# Patient Record
Sex: Female | Born: 1960 | Race: White | Hispanic: No | Marital: Married | Smoking: Never smoker
Health system: Southern US, Community
[De-identification: ages and names within clinical notes are randomized; demographics above are authoritative.]

## PROBLEM LIST (undated history)

## (undated) DIAGNOSIS — C801 Malignant (primary) neoplasm, unspecified: Secondary | ICD-10-CM

## (undated) DIAGNOSIS — E785 Hyperlipidemia, unspecified: Secondary | ICD-10-CM

## (undated) DIAGNOSIS — C19 Malignant neoplasm of rectosigmoid junction: Secondary | ICD-10-CM

## (undated) DIAGNOSIS — Z433 Encounter for attention to colostomy: Secondary | ICD-10-CM

## (undated) DIAGNOSIS — T7840XA Allergy, unspecified, initial encounter: Secondary | ICD-10-CM

## (undated) DIAGNOSIS — H269 Unspecified cataract: Secondary | ICD-10-CM

## (undated) HISTORY — DX: Unspecified cataract: H26.9

## (undated) HISTORY — DX: Hyperlipidemia, unspecified: E78.5

## (undated) HISTORY — DX: Malignant (primary) neoplasm, unspecified: C80.1

## (undated) HISTORY — PX: EYE SURGERY: SHX253

## (undated) HISTORY — DX: Allergy, unspecified, initial encounter: T78.40XA

## (undated) SURGERY — SIGMOIDOSCOPY, FLEXIBLE
Anesthesia: Monitor Anesthesia Care

---

## 1993-10-19 HISTORY — PX: BRAIN SURGERY: SHX531

## 2000-10-19 HISTORY — PX: ABDOMINAL HYSTERECTOMY: SHX81

## 2001-10-19 HISTORY — PX: BREAST SURGERY: SHX581

## 2020-10-28 ENCOUNTER — Encounter (HOSPITAL_COMMUNITY): Payer: Self-pay | Admitting: Emergency Medicine

## 2020-10-28 ENCOUNTER — Emergency Department (HOSPITAL_COMMUNITY)
Admission: EM | Admit: 2020-10-28 | Discharge: 2020-10-28 | Disposition: A | Discharge status: Home / Self Care | Payer: Self-pay | Attending: Emergency Medicine | Admitting: Emergency Medicine

## 2020-10-28 DIAGNOSIS — R230 Cyanosis: Secondary | ICD-10-CM | POA: Insufficient documentation

## 2020-10-28 DIAGNOSIS — Z5321 Procedure and treatment not carried out due to patient leaving prior to being seen by health care provider: Secondary | ICD-10-CM | POA: Insufficient documentation

## 2020-10-28 NOTE — ED Notes (Signed)
No answer per registration

## 2020-10-28 NOTE — ED Notes (Signed)
No answer

## 2020-10-28 NOTE — ED Triage Notes (Signed)
Pt here with c/o here fingers turning blue , they are no longer blue , has a palpable radial pulse , this has happened before pt does not have a primary MD

## 2020-10-28 NOTE — ED Notes (Signed)
No answer in waiting area.

## 2020-11-05 DIAGNOSIS — R059 Cough, unspecified: Secondary | ICD-10-CM | POA: Diagnosis not present

## 2020-11-05 DIAGNOSIS — R0981 Nasal congestion: Secondary | ICD-10-CM | POA: Diagnosis not present

## 2020-11-05 DIAGNOSIS — J0100 Acute maxillary sinusitis, unspecified: Secondary | ICD-10-CM | POA: Diagnosis not present

## 2020-11-14 ENCOUNTER — Encounter: Payer: Self-pay | Admitting: Family Medicine

## 2020-11-14 ENCOUNTER — Other Ambulatory Visit: Payer: Self-pay

## 2020-11-14 ENCOUNTER — Ambulatory Visit: Payer: BC Managed Care – PPO | Admitting: Family Medicine

## 2020-11-14 VITALS — BP 112/66 | HR 73 | Temp 98.3°F | Ht 66.0 in | Wt 146.5 lb

## 2020-11-14 DIAGNOSIS — Z1231 Encounter for screening mammogram for malignant neoplasm of breast: Secondary | ICD-10-CM | POA: Diagnosis not present

## 2020-11-14 DIAGNOSIS — E7800 Pure hypercholesterolemia, unspecified: Secondary | ICD-10-CM | POA: Diagnosis not present

## 2020-11-14 DIAGNOSIS — Z0001 Encounter for general adult medical examination with abnormal findings: Secondary | ICD-10-CM | POA: Diagnosis not present

## 2020-11-14 DIAGNOSIS — Z7689 Persons encountering health services in other specified circumstances: Secondary | ICD-10-CM

## 2020-11-14 DIAGNOSIS — Z0000 Encounter for general adult medical examination without abnormal findings: Secondary | ICD-10-CM

## 2020-11-14 NOTE — Patient Instructions (Signed)
High Cholesterol  High cholesterol is a condition in which the blood has high levels of a white, waxy substance similar to fat (cholesterol). The liver makes all the cholesterol that the body needs. The human body needs small amounts of cholesterol to help build cells. A person gets extra or excess cholesterol from the food that he or she eats. The blood carries cholesterol from the liver to the rest of the body. If you have high cholesterol, deposits (plaques) may build up on the walls of your arteries. Arteries are the blood vessels that carry blood away from your heart. These plaques make the arteries narrow and stiff. Cholesterol plaques increase your risk for heart attack and stroke. Work with your health care provider to keep your cholesterol levels in a healthy range. What increases the risk? The following factors may make you more likely to develop this condition:  Eating foods that are high in animal fat (saturated fat) or cholesterol.  Being overweight.  Not getting enough exercise.  A family history of high cholesterol (familial hypercholesterolemia).  Use of tobacco products.  Having diabetes. What are the signs or symptoms? There are no symptoms of this condition. How is this diagnosed? This condition may be diagnosed based on the results of a blood test.  If you are older than 61 years of age, your health care provider may check your cholesterol levels every 4-6 years.  You may be checked more often if you have high cholesterol or other risk factors for heart disease. The blood test for cholesterol measures:  "Bad" cholesterol, or LDL cholesterol. This is the main type of cholesterol that causes heart disease. The desired level is less than 100 mg/dL.  "Good" cholesterol, or HDL cholesterol. HDL helps protect against heart disease by cleaning the arteries and carrying the LDL to the liver for processing. The desired level for HDL is 60 mg/dL or higher.  Triglycerides.  These are fats that your body can store or burn for energy. The desired level is less than 150 mg/dL.  Total cholesterol. This measures the total amount of cholesterol in your blood and includes LDL, HDL, and triglycerides. The desired level is less than 200 mg/dL. How is this treated? This condition may be treated with:  Diet changes. You may be asked to eat foods that have more fiber and less saturated fats or added sugar.  Lifestyle changes. These may include regular exercise, maintaining a healthy weight, and quitting use of tobacco products.  Medicines. These are given when diet and lifestyle changes have not worked. You may be prescribed a statin medicine to help lower your cholesterol levels. Follow these instructions at home: Eating and drinking  Eat a healthy, balanced diet. This diet includes: ? Daily servings of a variety of fresh, frozen, or canned fruits and vegetables. ? Daily servings of whole grain foods that are rich in fiber. ? Foods that are low in saturated fats and trans fats. These include poultry and fish without skin, lean cuts of meat, and low-fat dairy products. ? A variety of fish, especially oily fish that contain omega-3 fatty acids. Aim to eat fish at least 2 times a week.  Avoid foods and drinks that have added sugar.  Use healthy cooking methods, such as roasting, grilling, broiling, baking, poaching, steaming, and stir-frying. Do not fry your food except for stir-frying.   Lifestyle  Get regular exercise. Aim to exercise for a total of 150 minutes a week. Increase your activity level by doing activities   such as gardening, walking, and taking the stairs.  Do not use any products that contain nicotine or tobacco, such as cigarettes, e-cigarettes, and chewing tobacco. If you need help quitting, ask your health care provider.   General instructions  Take over-the-counter and prescription medicines only as told by your health care provider.  Keep all  follow-up visits as told by your health care provider. This is important. Where to find more information  American Heart Association: www.heart.org  National Heart, Lung, and Blood Institute: www.nhlbi.nih.gov Contact a health care provider if:  You have trouble achieving or maintaining a healthy diet or weight.  You are starting an exercise program.  You are unable to stop smoking. Get help right away if:  You have chest pain.  You have trouble breathing.  You have any symptoms of a stroke. "BE FAST" is an easy way to remember the main warning signs of a stroke: ? B - Balance. Signs are dizziness, sudden trouble walking, or loss of balance. ? E - Eyes. Signs are trouble seeing or a sudden change in vision. ? F - Face. Signs are sudden weakness or numbness of the face, or the face or eyelid drooping on one side. ? A - Arms. Signs are weakness or numbness in an arm. This happens suddenly and usually on one side of the body. ? S - Speech. Signs are sudden trouble speaking, slurred speech, or trouble understanding what people say. ? T - Time. Time to call emergency services. Write down what time symptoms started.  You have other signs of a stroke, such as: ? A sudden, severe headache with no known cause. ? Nausea or vomiting. ? Seizure. These symptoms may represent a serious problem that is an emergency. Do not wait to see if the symptoms will go away. Get medical help right away. Call your local emergency services (911 in the [place] Do not drive yourself to the hospital. Summary  Cholesterol plaques increase your risk for heart attack and stroke. Work with your health care provider to keep your cholesterol levels in a healthy range.  Eat a healthy, balanced diet, get regular exercise, and maintain a healthy weight.  Do not use any products that contain nicotine or tobacco, such as cigarettes, e-cigarettes, and chewing tobacco.  Get help right away if you have any symptoms of a  stroke. This information is not intended to replace advice given to you by your health care provider. Make sure you discuss any questions you have with your health care provider. Document Revised: 09/04/2019 Document Reviewed: 09/04/2019 Elsevier Patient Education  2021 Elsevier Inc.  

## 2020-11-14 NOTE — Progress Notes (Signed)
New Patient Office Visit  Subjective:  Patient ID: Karen Kaufman, female    DOB: Sep 30, 1961  Age: 60 y.o. MRN: [id number]  CC:  Chief Complaint  Patient presents with  . New Patient (Initial Visit)    HPI Karen Kaufman presents to establish care. She recently moved her from [place]. She was seeing her previous PCP every 6 months.   She has a history of high cholesterol. She takes atorvastatin daily and deneis side effects.. She just completed steroids for sinusitis. She has a few more days on the antibiotics for sinusitis. She has no concerns today.   Past Medical History:  Diagnosis Date  . Allergy   . Cancer [facility]    skin cancer removed by dermatology  . Cataract   . Hyperlipidemia     Past Surgical History:  Procedure Laterality Date  . ABDOMINAL HYSTERECTOMY  2002  . 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 BREAST SURGERY  2003   breast implants  . EYE SURGERY     bilateral cataract removal    Family History  Problem Relation Age of Onset  . Cancer Mother        breast  . Diabetes Mother   . Hyperlipidemia Mother   . Hypertension Mother   . Cancer Father        prostate  . Arthritis Maternal Grandmother   . Arthritis Maternal Grandfather   . Cancer Maternal Grandfather        hodgkins lymphoma  . Arthritis Paternal Grandmother   . Arthritis Paternal Grandfather   . Diabetes Paternal Grandfather     Social History   Socioeconomic History  . Marital status: Married    Spouse name: Not on file  . Number of children: 2  . Years of education: 61  . Highest education level: Some college, no degree  Occupational History  . Occupation: retired  Tobacco Use  . Smoking status: Never Smoker  . Smokeless tobacco: Never Used  Vaping Use  . Vaping Use: Never used  Substance and Sexual Activity  . Alcohol use: Not Currently  . Drug use: Not Currently  . Sexual activity: Yes    Birth control/protection: Surgical  Other  Topics Concern  . Not on file  Social History Narrative  . Not on file   Social Determinants of Health   Financial Resource Strain: Not on file  Food Insecurity: Not on file  Transportation Needs: Not on file  Physical Activity: Not on file  Stress: Not on file  Social Connections: Not on file  Intimate Partner Violence: Not on file    ROS Review of Systems Negative unless specially indicated above in HPI.  Objective:   Today's Vitals: BP 112/66   Pulse 73   Temp 98.3 F (36.8 C) (Temporal)   Ht _0  (1.676 m)   Wt 146 lb 8 oz (66.5 kg)   BMI 23.65 kg/m   Physical Exam Vitals and nursing note reviewed.  Constitutional:      Appearance: Normal appearance.  HENT:     Head: Normocephalic and atraumatic.     Right Ear: Tympanic membrane, ear canal and external ear normal.     Left Ear: Tympanic membrane, ear canal and external ear normal.     Nose: Nose normal.     Mouth/Throat:     Mouth: Mucous membranes are moist.     Pharynx: Oropharynx is clear.  Eyes:     Extraocular  Movements: Extraocular movements intact.     Conjunctiva/sclera: Conjunctivae normal.  Neck:     Vascular: No carotid bruit.  Cardiovascular:     Rate and Rhythm: Normal rate and regular rhythm.     Heart sounds: Normal heart sounds. No murmur heard.   Pulmonary:     Effort: Pulmonary effort is normal. No respiratory distress.     Breath sounds: Normal breath sounds.  Musculoskeletal:     Cervical back: Neck supple. No tenderness.     Right lower leg: No edema.     Left lower leg: No edema.  Skin:    General: Skin is warm and dry.  Neurological:     General: No focal deficit present.     Mental Status: She is alert and oriented to person, place, and time.     Motor: No weakness.     Gait: Gait normal.  Psychiatric:        Mood and Affect: Mood normal.        Behavior: Behavior normal.     Assessment & Plan:   Walsie was seen today for new patient (initial visit).  Diagnoses  and all orders for this visit:  Hypercholesterolemia On statin therapy. Patient is not fasting, she will return for fasting blood work.  -     CMP14+EGFR; Future -     Lipid panel; Future  Encounter for screening mammogram for malignant neoplasm of breast Mammogram ordered. -     MM Digital Screening; Future  Healthcare maintenance Reports having cologuard done last year. She will return in a few weeks for a flu vaccine and TDAP.   Encounter to establish care Patient will return for labs. Records requested from previous PCP.  -     CBC with Differential/Platelet; Future -     CMP14+EGFR; Future -     Lipid panel; Future -     TSH; Future  Follow-up: Return in about 6 months (around 05/14/2021) for chronic follow up.   Gwenlyn Perking, FNP

## 2020-11-28 ENCOUNTER — Other Ambulatory Visit: Payer: BC Managed Care – PPO

## 2020-11-28 ENCOUNTER — Other Ambulatory Visit: Payer: Self-pay

## 2020-11-28 DIAGNOSIS — Z7689 Persons encountering health services in other specified circumstances: Secondary | ICD-10-CM

## 2020-11-28 DIAGNOSIS — E7800 Pure hypercholesterolemia, unspecified: Secondary | ICD-10-CM | POA: Diagnosis not present

## 2020-11-29 LAB — CBC WITH DIFFERENTIAL/PLATELET
Basophils Absolute: 0 10*3/uL (ref 0.0–0.2)
Basos: 0 %
EOS (ABSOLUTE): 0.1 10*3/uL (ref 0.0–0.4)
Eos: 2 %
Hematocrit: 41.5 % (ref 34.0–46.6)
Hemoglobin: 14 g/dL (ref 11.1–15.9)
Immature Grans (Abs): 0 10*3/uL (ref 0.0–0.1)
Immature Granulocytes: 0 %
Lymphocytes Absolute: 2 10*3/uL (ref 0.7–3.1)
Lymphs: 37 %
MCH: 30.4 pg (ref 26.6–33.0)
MCHC: 33.7 g/dL (ref 31.5–35.7)
MCV: 90 fL (ref 79–97)
Monocytes Absolute: 0.4 10*3/uL (ref 0.1–0.9)
Monocytes: 8 %
Neutrophils Absolute: 2.9 10*3/uL (ref 1.4–7.0)
Neutrophils: 53 %
Platelets: 238 10*3/uL (ref 150–450)
RBC: 4.6 x10E6/uL (ref 3.77–5.28)
RDW: 12.5 % (ref 11.7–15.4)
WBC: 5.5 10*3/uL (ref 3.4–10.8)

## 2020-11-29 LAB — LIPID PANEL
Chol/HDL Ratio: 2.9 ratio (ref 0.0–4.4)
Cholesterol, Total: 148 mg/dL (ref 100–199)
HDL: 51 mg/dL (ref >39)
LDL Chol Calc (NIH): 76 mg/dL (ref 0–99)
Triglycerides: 120 mg/dL (ref 0–149)
VLDL Cholesterol Cal: 21 mg/dL (ref 5–40)

## 2020-11-29 LAB — CMP14+EGFR
ALT: 12 IU/L (ref 0–32)
AST: 20 IU/L (ref 0–40)
Albumin/Globulin Ratio: 2.3 — ABNORMAL HIGH (ref 1.2–2.2)
Albumin: 4.6 g/dL (ref 3.8–4.8)
Alkaline Phosphatase: 118 IU/L (ref 44–121)
BUN/Creatinine Ratio: 9 — ABNORMAL LOW (ref 12–28)
BUN: 7 mg/dL — ABNORMAL LOW (ref 8–27)
Bilirubin Total: 0.5 mg/dL (ref 0.0–1.2)
CO2: 25 mmol/L (ref 20–29)
Calcium: 9.6 mg/dL (ref 8.7–10.3)
Chloride: 101 mmol/L (ref 96–106)
Creatinine, Ser: 0.8 mg/dL (ref 0.57–1.00)
GFR calc Af Amer: 92 mL/min/{1.73_m2} (ref >59)
GFR calc non Af Amer: 80 mL/min/{1.73_m2} (ref >59)
Globulin, Total: 2 g/dL (ref 1.5–4.5)
Glucose: 94 mg/dL (ref 65–99)
Potassium: 4.9 mmol/L (ref 3.5–5.2)
Sodium: 141 mmol/L (ref 134–144)
Total Protein: 6.6 g/dL (ref 6.0–8.5)

## 2020-11-29 LAB — TSH: TSH: 2.69 u[IU]/mL (ref 0.450–4.500)

## 2020-12-11 ENCOUNTER — Encounter: Payer: Self-pay | Admitting: Family Medicine

## 2020-12-11 ENCOUNTER — Ambulatory Visit: Payer: BC Managed Care – PPO | Admitting: Family Medicine

## 2020-12-11 ENCOUNTER — Other Ambulatory Visit: Payer: Self-pay

## 2020-12-11 VITALS — BP 110/70 | HR 68 | Temp 97.9°F | Ht 66.0 in | Wt 148.2 lb

## 2020-12-11 DIAGNOSIS — Z23 Encounter for immunization: Secondary | ICD-10-CM

## 2020-12-11 DIAGNOSIS — Z0001 Encounter for general adult medical examination with abnormal findings: Secondary | ICD-10-CM | POA: Diagnosis not present

## 2020-12-11 DIAGNOSIS — Z0184 Encounter for antibody response examination: Secondary | ICD-10-CM | POA: Diagnosis not present

## 2020-12-11 DIAGNOSIS — Z0289 Encounter for other administrative examinations: Secondary | ICD-10-CM

## 2020-12-11 NOTE — Progress Notes (Signed)
Established Patient Office Visit  Subjective:  Patient ID: Karen Kaufman, female    DOB: [birth year]  Age: 60 y.o. MRN: [id number]  CC:  Chief Complaint  Patient presents with  . Employment Physical    HPI Karen Kaufman presents for an employment physical. 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 She has recently moved from [facility] and is unsure of her immunization status regarding Hep B. She knows it has been at least 5 years since her last tetanus and probably longer. She reports that she is doing well and denies any concerns today.   Past Medical History:  Diagnosis Date  . Allergy   . Cancer [facility]    skin cancer removed by dermatology  . Cataract   . Hyperlipidemia     Past Surgical History:  Procedure Laterality Date  . ABDOMINAL HYSTERECTOMY  2002  . 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 BREAST SURGERY  2003   breast implants  . EYE SURGERY     bilateral cataract removal    Family History  Problem Relation Age of Onset  . Cancer Mother        breast  . Diabetes Mother   . Hyperlipidemia Mother   . Hypertension Mother   . Cancer Father        prostate  . Arthritis Maternal Grandmother   . Arthritis Maternal Grandfather   . Cancer Maternal Grandfather        hodgkins lymphoma  . Arthritis Paternal Grandmother   . Arthritis Paternal Grandfather   . Diabetes Paternal Grandfather     Social History   Socioeconomic History  . Marital status: Married    Spouse name: Not on file  . Number of children: 2  . Years of education: 42  . Highest education level: Some college, no degree  Occupational History  . Occupation: retired  Tobacco Use  . Smoking status: Never Smoker  . Smokeless tobacco: Never Used  Vaping Use  . Vaping Use: Never used  Substance and Sexual Activity  . Alcohol use: Not Currently  . Drug use: Not Currently  . Sexual activity: Yes    Birth  control/protection: Surgical  Other Topics Concern  . Not on file  Social History Narrative  . Not on file   Social Determinants of Health   Financial Resource Strain: Not on file  Food Insecurity: Not on file  Transportation Needs: Not on file  Physical Activity: Not on file  Stress: Not on file  Social Connections: Not on file  Intimate Partner Violence: Not on file    Outpatient Medications Prior to Visit  Medication Sig Dispense Refill  . atorvastatin (LIPITOR) 20 MG tablet Take 20 mg by mouth daily.    Marland Kitchen MAGNESIUM PO Take 400 mg by mouth daily.    Marland Kitchen ipratropium (ATROVENT) 0.06 % nasal spray 2 sprays into each nostril Three (3) times a day.     No facility-administered medications prior to visit.    Allergies  Allergen Reactions  . Iodine Anaphylaxis  . Shellfish Allergy Anaphylaxis    ROS Review of Systems Negative unless specially indicated above in HPI.   Objective:    Physical Exam Vitals and nursing note reviewed.  Constitutional:      General: She is not in acute distress.    Appearance: Normal appearance. She is well-developed. She is not ill-appearing, toxic-appearing or diaphoretic.  HENT:     Head: Normocephalic and atraumatic.     Nose: Nose normal.     Mouth/Throat:     Mouth: Mucous membranes are moist.     Pharynx: Oropharynx is clear.  Eyes:     Extraocular Movements: Extraocular movements intact.     Conjunctiva/sclera: Conjunctivae normal.     Pupils: Pupils are equal, round, and reactive to light.  Neck:     Thyroid: No thyromegaly.     Vascular: No carotid bruit or JVD.     Trachea: Trachea normal.  Cardiovascular:     Rate and Rhythm: Normal rate and regular rhythm.     Heart sounds: Normal heart sounds. No murmur heard. No friction rub. No gallop.   Pulmonary:     Effort: Pulmonary effort is normal.     Breath sounds: Normal breath sounds.  Abdominal:     General: Bowel sounds are normal. There is no distension.      Palpations: Abdomen is soft. There is no mass.     Tenderness: There is no abdominal tenderness.  Musculoskeletal:        General: Normal range of motion.     Cervical back: Full passive range of motion without pain, normal range of motion and neck supple.     Right lower leg: No edema.     Left lower leg: No edema.  Lymphadenopathy:     Cervical: No cervical adenopathy.  Skin:    General: Skin is warm and dry.  Neurological:     General: No focal deficit present.     Mental Status: She is alert and oriented to person, place, and time.     Motor: No weakness.     Gait: Gait normal.     Deep Tendon Reflexes: Reflexes are normal and symmetric.  Psychiatric:        Mood and Affect: Mood normal.        Behavior: Behavior normal.        Thought Content: Thought content normal.        Judgment: Judgment normal.     Temp 97.9 F (36.6 C) (Temporal)   Ht 5\' 6"  (1.676 m)   Wt 148 lb 4 oz (67.2 kg)   BMI 23.93 kg/m  Wt Readings from Last 3 Encounters:  12/11/20 148 lb 4 oz (67.2 kg)  11/14/20 146 lb 8 oz (66.5 kg)  10/28/20 142 lb (64.4 kg)     Health Maintenance Due  Topic Date Due  . TETANUS/TDAP  Never done  . COLONOSCOPY (Pts 45-58yrs Insurance coverage will need to be confirmed)  Never done  . MAMMOGRAM  Never done  . INFLUENZA VACCINE  Never done    There are no preventive care reminders to display for this patient.  Lab Results  Component Value Date   TSH 2.690 11/28/2020   Lab Results  Component Value Date   WBC 5.5 11/28/2020   HGB 14.0 11/28/2020   HCT 41.5 11/28/2020   MCV 90 11/28/2020   PLT 238 11/28/2020   Lab Results  Component Value Date   NA 141 11/28/2020   K 4.9 11/28/2020   CO2 25 11/28/2020   GLUCOSE 94 11/28/2020   BUN 7 (L) 11/28/2020   CREATININE 0.80 11/28/2020   BILITOT 0.5 11/28/2020   ALKPHOS 118 11/28/2020   AST 20 11/28/2020   ALT 12 11/28/2020   PROT 6.6 11/28/2020   ALBUMIN 4.6 11/28/2020   CALCIUM 9.6 11/28/2020   Lab  Results  Component Value Date   CHOL 148 11/28/2020   Lab Results  Component Value Date   HDL 51 11/28/2020   Lab Results  Component Value Date   LDLCALC 76 11/28/2020   Lab Results  Component Value Date   TRIG 120 11/28/2020   Lab Results  Component Value Date   CHOLHDL 2.9 11/28/2020   No results found for: HGBA1C    Assessment & Plan:   Karen Kaufman was seen today for employment physical.  Diagnoses and all orders for this visit:  Encounter for physical examination related to employment Exam unremarkable today, no physical or mental disabilities that would impair job performance. Titers pending as below. Quantiferon gold ordered for TB testing. Will complete paperwork for patient when labs result.  -     Measles/Mumps/Rubella Immunity -     Varicella zoster antibody, IgG -     QuantiFERON-TB Gold Plus -     Hepatitis B surface antibody,quantitative  Immunity status testing Labs pending as below -     Measles/Mumps/Rubella Immunity -     Varicella zoster antibody, IgG -     QuantiFERON-TB Gold Plus -     Hepatitis B surface antibody,quantitative  Need for Tdap vaccination Vaccine today in office.    Follow-up: Return if symptoms worsen or fail to improve.   The patient indicates understanding of these issues and agrees with the plan.    Gwenlyn Perking, FNP

## 2020-12-11 NOTE — Patient Instructions (Signed)
Tdap (Tetanus, Diphtheria, Pertussis) Vaccine: What You Need to Know 1. Why get vaccinated? Tdap vaccine can prevent tetanus, diphtheria, and pertussis. Diphtheria and pertussis spread from person to person. Tetanus enters the body through cuts or wounds.  TETANUS (T) causes painful stiffening of the muscles. Tetanus can lead to serious health problems, including being unable to open the mouth, having trouble swallowing and breathing, or death.  DIPHTHERIA (D) can lead to difficulty breathing, heart failure, paralysis, or death.  PERTUSSIS (aP), also known as "whooping cough," can cause uncontrollable, violent coughing that makes it hard to breathe, eat, or drink. Pertussis can be extremely serious especially in babies and young children, causing pneumonia, convulsions, brain damage, or death. In teens and adults, it can cause weight loss, loss of bladder control, passing out, and rib fractures from severe coughing. 2. Tdap vaccine Tdap is only for children 7 years and older, adolescents, and adults.  Adolescents should receive a single dose of Tdap, preferably at age 11 or 12 years. Pregnant people should get a dose of Tdap during every pregnancy, preferably during the early part of the third trimester, to help protect the newborn from pertussis. Infants are most at risk for severe, life-threatening complications from pertussis. Adults who have never received Tdap should get a dose of Tdap. Also, adults should receive a booster dose of either Tdap or Td (a different vaccine that protects against tetanus and diphtheria but not pertussis) every 10 years, or after 5 years in the case of a severe or dirty wound or burn. Tdap may be given at the same time as other vaccines. 3. Talk with your health care provider Tell your vaccine provider if the person getting the vaccine:  Has had an allergic reaction after a previous dose of any vaccine that protects against tetanus, diphtheria, or pertussis, or  has any severe, life-threatening allergies  Has had a coma, decreased level of consciousness, or prolonged seizures within 7 days after a previous dose of any pertussis vaccine (DTP, DTaP, or Tdap)  Has seizures or another nervous system problem  Has ever had Guillain-Barr Syndrome (also called "GBS")  Has had severe pain or swelling after a previous dose of any vaccine that protects against tetanus or diphtheria In some cases, your health care provider may decide to postpone Tdap vaccination until a future visit. People with minor illnesses, such as a cold, may be vaccinated. People who are moderately or severely ill should usually wait until they recover before getting Tdap vaccine.  Your health care provider can give you more information. 4. Risks of a vaccine reaction  Pain, redness, or swelling where the shot was given, mild fever, headache, feeling tired, and nausea, vomiting, diarrhea, or stomachache sometimes happen after Tdap vaccination. People sometimes faint after medical procedures, including vaccination. Tell your provider if you feel dizzy or have vision changes or ringing in the ears.  As with any medicine, there is a very remote chance of a vaccine causing a severe allergic reaction, other serious injury, or death. 5. What if there is a serious problem? An allergic reaction could occur after the vaccinated person leaves the clinic. If you see signs of a severe allergic reaction (hives, swelling of the face and throat, difficulty breathing, a fast heartbeat, dizziness, or weakness), call 9-1-1 and get the person to the nearest hospital. For other signs that concern you, call your health care provider.  Adverse reactions should be reported to the Vaccine Adverse Event Reporting System (VAERS). Your health   care provider will usually file this report, or you can do it yourself. Visit the VAERS website at www.vaers.hhs.gov or call 1-800-822-7967. VAERS is only for reporting  reactions, and VAERS staff members do not give medical advice. 6. The National Vaccine Injury Compensation Program The National Vaccine Injury Compensation Program (VICP) is a federal program that was created to compensate people who may have been injured by certain vaccines. Claims regarding alleged injury or death due to vaccination have a time limit for filing, which may be as short as two years. Visit the VICP website at www.hrsa.gov/vaccinecompensation or call 1-800-338-2382 to learn about the program and about filing a claim. 7. How can I learn more?  Ask your health care provider.  Call your local or state health department.  Visit the website of the Food and Drug Administration (FDA) for vaccine package inserts and additional information at www.fda.gov/vaccines-blood-biologics/vaccines.  Contact the Centers for Disease Control and Prevention (CDC): ? Call 1-800-232-4636 (1-800-CDC-INFO) or ? Visit CDC's website at www.cdc.gov/vaccines. Vaccine Information Statement Tdap (Tetanus, Diphtheria, Pertussis) Vaccine (05/24/2020) This information is not intended to replace advice given to you by your health care provider. Make sure you discuss any questions you have with your health care provider. Document Revised: 06/19/2020 Document Reviewed: 06/19/2020 Elsevier Patient Education  2021 Elsevier Inc.  

## 2020-12-14 LAB — QUANTIFERON-TB GOLD PLUS
QuantiFERON Mitogen Value: >10 IU/mL
QuantiFERON Nil Value: 0.05 IU/mL
QuantiFERON TB1 Ag Value: 0.04 IU/mL
QuantiFERON TB2 Ag Value: 0.05 IU/mL
QuantiFERON-TB Gold Plus: NEGATIVE

## 2020-12-14 LAB — VARICELLA ZOSTER ANTIBODY, IGG: Varicella zoster IgG: 1363 index (ref >165)

## 2020-12-14 LAB — MEASLES/MUMPS/RUBELLA IMMUNITY
MUMPS ABS, IGG: 216 AU/mL (ref >10.9)
RUBEOLA AB, IGG: >300 AU/mL (ref >16.4)
Rubella Antibodies, IGG: 7.55 index (ref >0.99)

## 2020-12-14 LAB — HEPATITIS B SURFACE ANTIBODY, QUANTITATIVE: Hepatitis B Surf Ab Quant: <3.1 m[IU]/mL — ABNORMAL LOW (ref >9.9)

## 2020-12-17 ENCOUNTER — Other Ambulatory Visit: Payer: Self-pay

## 2020-12-17 ENCOUNTER — Ambulatory Visit (INDEPENDENT_AMBULATORY_CARE_PROVIDER_SITE_OTHER): Payer: BC Managed Care – PPO | Admitting: *Deleted

## 2020-12-17 DIAGNOSIS — Z23 Encounter for immunization: Secondary | ICD-10-CM

## 2020-12-17 NOTE — Progress Notes (Signed)
Tolerated well

## 2021-01-30 ENCOUNTER — Other Ambulatory Visit: Payer: Self-pay

## 2021-01-30 ENCOUNTER — Ambulatory Visit (INDEPENDENT_AMBULATORY_CARE_PROVIDER_SITE_OTHER): Payer: BC Managed Care – PPO | Admitting: *Deleted

## 2021-01-30 DIAGNOSIS — Z23 Encounter for immunization: Secondary | ICD-10-CM | POA: Diagnosis not present

## 2021-01-30 NOTE — Progress Notes (Signed)
Pt tolerated Hep B #2 well

## 2021-01-31 ENCOUNTER — Ambulatory Visit: Payer: BC Managed Care – PPO

## 2021-02-12 ENCOUNTER — Other Ambulatory Visit: Payer: Self-pay | Admitting: Family Medicine

## 2021-02-12 ENCOUNTER — Other Ambulatory Visit: Payer: Self-pay

## 2021-02-12 ENCOUNTER — Ambulatory Visit
Admission: RE | Admit: 2021-02-12 | Discharge: 2021-02-12 | Disposition: A | Discharge status: Home / Self Care | Payer: BC Managed Care – PPO | Source: Ambulatory Visit | Attending: Family Medicine | Admitting: Family Medicine

## 2021-02-12 DIAGNOSIS — Z1231 Encounter for screening mammogram for malignant neoplasm of breast: Secondary | ICD-10-CM

## 2021-03-03 ENCOUNTER — Telehealth: Payer: BC Managed Care – PPO | Admitting: Family Medicine

## 2021-03-03 ENCOUNTER — Encounter: Payer: Self-pay | Admitting: Family Medicine

## 2021-03-03 DIAGNOSIS — U071 COVID-19: Secondary | ICD-10-CM

## 2021-03-03 NOTE — Progress Notes (Signed)
   Virtual Visit  Note Due to COVID-19 pandemic this visit was conducted virtually. This visit type was conducted due to national recommendations for restrictions regarding the COVID-19 Pandemic (e.g. social distancing, sheltering in place) in an effort to limit this patient's exposure and mitigate transmission in our community. All issues noted in this document were discussed and addressed.  A physical exam was not performed with this format.  I connected with Karen Kaufman on 03/03/21 at 0820 by telephone and verified that I am speaking with the correct person using two identifiers. Karen Kaufman is currently located at home and her husband is currently with her during the visit. The provider, Gwenlyn Perking, FNP is located in their office at time of visit.  I discussed the limitations, risks, security and privacy concerns of performing an evaluation and management service by telephone and the availability of in person appointments. I also discussed with the patient that there may be a patient responsible charge related to this service. The patient expressed understanding and agreed to proceed.  CC: Covid positive  History and Present Illness:  HPI  Karen Kaufman tested positive for Covid last night with a home Covid test. She reports that her symptoms started 5 days ago with a headache, nausea, chills, congestion, and sinus pressure. She also reports fatigue and cough. Reports highest temperature of 99.6. She has been taking sudafed with some improvement. She denies shortness of breath or chest pain. She has been vaccinated.    ROS As per HPI.  Observations/Objective: Alert and oriented x 3. Able to speak in full sentences without difficulty.    Assessment and Plan: Diagnoses and all orders for this visit:  COVID Symptomatic care discussed: tylenol for pain, fever. Mucinex for congestion, cough. Rest, stay well hydrated. Quarantine until respiratory symptoms improve. Work note provided.  Patient is aware of when to seek emergency care. Return to office for new or worsening symptoms, or if symptoms persist.     Follow Up Instructions: Return to office for new or worsening symptoms, or if symptoms persist.     I discussed the assessment and treatment plan with the patient. The patient was provided an opportunity to ask questions and all were answered. The patient agreed with the plan and demonstrated an understanding of the instructions.   The patient was advised to call back or seek an in-person evaluation if the symptoms worsen or if the condition fails to improve as anticipated.  The above assessment and management plan was discussed with the patient. The patient verbalized understanding of and has agreed to the management plan. Patient is aware to call the clinic if symptoms persist or worsen. Patient is aware when to return to the clinic for a follow-up visit. Patient educated on when it is appropriate to go to the emergency department.   Time call ended: 0832   I provided 12 minutes of  non face-to-face time during this encounter.    Gwenlyn Perking, FNP

## 2021-04-04 ENCOUNTER — Ambulatory Visit (INDEPENDENT_AMBULATORY_CARE_PROVIDER_SITE_OTHER): Payer: BC Managed Care – PPO

## 2021-04-04 ENCOUNTER — Other Ambulatory Visit: Payer: Self-pay

## 2021-04-04 ENCOUNTER — Encounter: Payer: Self-pay | Admitting: Family Medicine

## 2021-04-04 ENCOUNTER — Ambulatory Visit: Payer: BC Managed Care – PPO | Admitting: Family Medicine

## 2021-04-04 VITALS — BP 111/73 | HR 70 | Temp 96.9°F | Ht 66.0 in | Wt 153.4 lb

## 2021-04-04 DIAGNOSIS — R194 Change in bowel habit: Secondary | ICD-10-CM | POA: Diagnosis not present

## 2021-04-04 DIAGNOSIS — R159 Full incontinence of feces: Secondary | ICD-10-CM

## 2021-04-04 DIAGNOSIS — R140 Abdominal distension (gaseous): Secondary | ICD-10-CM

## 2021-04-04 NOTE — Patient Instructions (Signed)
Abdominal Bloating When you have abdominal bloating, your abdomen may feel full, tight, or painful. It may also look bigger than normal or swollen (distended). Common causes of abdominal bloating include: Swallowing air. Constipation. Problems digesting food. Eating too much. Irritable bowel syndrome. This is a condition that affects the large intestine. Lactose intolerance. This is an inability to digest lactose, a natural sugar in dairy products. Celiac disease. This is a condition that affects the ability to digest gluten, a protein found in some grains. Gastroparesis. This is a condition that slows down the movement of food in the stomach and small intestine. It is more common in people with diabetes mellitus. Gastroesophageal reflux disease (GERD). This is a digestive condition that makes stomach acid flow back into the esophagus. Urinary retention. This means that the body is holding onto urine, and the bladder cannot be emptied all the way. Follow these instructions at home: Eating and drinking Avoid eating too much. Try not to swallow air while talking or eating. Avoid eating while lying down. Avoid these foods and drinks: Foods that cause gas, such as broccoli, cabbage, cauliflower, and baked beans. Carbonated drinks. Hard candy. Chewing gum. Medicines Take over-the-counter and prescription medicines only as told by your health care provider. Take probiotic medicines. These medicines contain live bacteria or yeasts that can help digestion. Take coated peppermint oil capsules. Activity Try to exercise regularly. Exercise may help to relieve bloating that is caused by gas and relieve constipation. General instructions Keep all follow-up visits as told by your health care provider. This is important. Contact a health care provider if: You have nausea and vomiting. You have diarrhea. You have abdominal pain. You have unusual weight loss or weight gain. You have severe pain,  and medicines do not help. Get help right away if: You have severe chest pain. You have trouble breathing. You have shortness of breath. You have trouble urinating. You have darker urine than normal. You have blood in your stools or have dark, tarry stools. Summary Abdominal bloating means that the abdomen is swollen. Common causes of abdominal bloating are swallowing air, constipation, and problems digesting food. Avoid eating too much and avoid swallowing air. Avoid foods that cause gas, carbonated drinks, hard candy, and chewing gum. This information is not intended to replace advice given to you by your health care provider. Make sure you discuss any questions you have with your health care provider. Document Revised: 01/23/2019 Document Reviewed: 11/06/2016 Elsevier Patient Education  2021 Elsevier Inc.  

## 2021-04-04 NOTE — Progress Notes (Signed)
Acute Office Visit  Subjective:    Patient ID: Karen Kaufman, female    DOB: 16-Jan-1961, 60 y.o.   MRN: [id number]  Chief Complaint  Patient presents with   Bloated    HPI Patient is in today for bloating and fecal incontinence for the last 4 months. The bloating is intermittent. The incontinence is almost daily, mostly occurs with passing flatus but sometimes occurs unprovoked. She has had to wear a panty liner because of this. Also reports increase flatus. Denies fever, change in diet, change in appetite, blood in stool, nausea, vomiting, weight loss, or abdominal pain. Denies constipation or diarrhea. She has a BM daily that is easy to pass. She sometimes sees mucus in her stool. Stool is brown in color. She has never had had a colonoscopy. Denies family history of colon cancer.  Past Medical History:  Diagnosis Date   Allergy    Cancer ([place])    skin cancer removed by dermatology   Cataract    Hyperlipidemia     Past Surgical History:  Procedure Laterality Date   ABDOMINAL HYSTERECTOMY  2002   [place]   craniotomy-removal of tumor from behind right eye   BREAST SURGERY  2003   breast implants   EYE SURGERY     bilateral cataract removal    Family History  Problem Relation Age of Onset   Cancer Mother        breast   Diabetes Mother    Hyperlipidemia Mother    Hypertension Mother    Cancer Father        prostate   Arthritis Maternal Grandmother    Arthritis Maternal Grandfather    Cancer Maternal Grandfather        hodgkins lymphoma   Arthritis Paternal Grandmother    Arthritis Paternal Grandfather    Diabetes Paternal Grandfather     Social History   Socioeconomic History   Marital status: Married    Spouse name: Not on file   Number of children: 2   Years of education: 13   Highest education level: Some college, no degree  Occupational History   Occupation: retired  Tobacco Use   Smoking status: Never   Smokeless tobacco: Never  IT trainer Use: Never used  Substance and Sexual Activity   Alcohol use: Not Currently   Drug use: Not Currently   Sexual activity: Yes    Birth control/protection: Surgical  Other Topics Concern   Not on file  Social History Narrative   Not on file   Social Determinants of Health   Financial Resource Strain: Not on file  Food Insecurity: Not on file  Transportation Needs: Not on file  Physical Activity: Not on file  Stress: Not on file  Social Connections: Not on file  Intimate Partner Violence: Not on file    Outpatient Medications Prior to Visit  Medication Sig Dispense Refill   atorvastatin (LIPITOR) 20 MG tablet Take 20 mg by mouth daily.     MAGNESIUM PO Take 400 mg by mouth daily.     No facility-administered medications prior to visit.    Allergies  Allergen Reactions   Iodine Anaphylaxis   Shellfish Allergy Anaphylaxis    Review of Systems As per HPI.     Objective:    Physical Exam Vitals and nursing note reviewed.  Constitutional:      General: She is not in acute distress.    Appearance: Normal appearance. She is not  ill-appearing, toxic-appearing or diaphoretic.  Cardiovascular:     Rate and Rhythm: Normal rate and regular rhythm.     Heart sounds: Normal heart sounds. No murmur heard. Pulmonary:     Effort: Pulmonary effort is normal. No respiratory distress.     Breath sounds: Normal breath sounds.  Abdominal:     General: Bowel sounds are normal. There is no distension.     Palpations: Abdomen is soft. There is no mass.     Tenderness: There is no abdominal tenderness. There is no guarding or rebound.     Hernia: No hernia is present.  Musculoskeletal:     Right lower leg: No edema.     Left lower leg: No edema.  Skin:    General: Skin is warm and dry.  Neurological:     General: No focal deficit present.     Mental Status: She is alert and oriented to person, place, and time.  Psychiatric:        Mood and Affect: Mood normal.         Behavior: Behavior normal.    BP 111/73   Pulse 70   Temp (!) 96.9 F (36.1 C) (Oral)   Ht 5\' 6"  (1.676 m)   Wt 153 lb 6 oz (69.6 kg)   BMI 24.76 kg/m  Wt Readings from Last 3 Encounters:  04/04/21 153 lb 6 oz (69.6 kg)  12/11/20 148 lb 4 oz (67.2 kg)  11/14/20 146 lb 8 oz (66.5 kg)    Health Maintenance Due  Topic Date Due   COLONOSCOPY (Pts 45-53yrs Insurance coverage will need to be confirmed)  Never done   Zoster Vaccines- Shingrix (1 of 2) Never done   COVID-19 Vaccine (3 - Booster for [place] series) 02/01/2021    There are no preventive care reminders to display for this patient.   Lab Results  Component Value Date   TSH 2.690 11/28/2020   Lab Results  Component Value Date   WBC 5.5 11/28/2020   HGB 14.0 11/28/2020   HCT 41.5 11/28/2020   MCV 90 11/28/2020   PLT 238 11/28/2020   Lab Results  Component Value Date   NA 141 11/28/2020   K 4.9 11/28/2020   CO2 25 11/28/2020   GLUCOSE 94 11/28/2020   BUN 7 (L) 11/28/2020   CREATININE 0.80 11/28/2020   BILITOT 0.5 11/28/2020   ALKPHOS 118 11/28/2020   AST 20 11/28/2020   ALT 12 11/28/2020   PROT 6.6 11/28/2020   ALBUMIN 4.6 11/28/2020   CALCIUM 9.6 11/28/2020   Lab Results  Component Value Date   CHOL 148 11/28/2020   Lab Results  Component Value Date   HDL 51 11/28/2020   Lab Results  Component Value Date   LDLCALC 76 11/28/2020   Lab Results  Component Value Date   TRIG 120 11/28/2020   Lab Results  Component Value Date   CHOLHDL 2.9 11/28/2020   No results found for: HGBA1C     Assessment & Plan:   Allien was seen today for bloated.  Diagnoses and all orders for this visit:  Incontinence of feces, unspecified fecal incontinence type Bloating Bloating, fecal incontinence usually with passing gas, increased flatus. No other symptoms. Xray appear normal today, radiology report is pending. Benign exam today. Referral placed to GI for further assessment and colonoscopy for colon  cancer screening.  -     DG Abd 1 View; Future -     Ambulatory referral to Gastroenterology  Return  in about 8 months (around 12/11/2021) for CPE. Sooner for new or worsening symptoms.   The patient indicates understanding of these issues and agrees with the plan.    Gwenlyn Perking, FNP

## 2021-04-11 ENCOUNTER — Ambulatory Visit (INDEPENDENT_AMBULATORY_CARE_PROVIDER_SITE_OTHER): Payer: BC Managed Care – PPO | Admitting: *Deleted

## 2021-04-11 ENCOUNTER — Other Ambulatory Visit: Payer: Self-pay

## 2021-04-11 DIAGNOSIS — Z23 Encounter for immunization: Secondary | ICD-10-CM

## 2021-04-15 ENCOUNTER — Encounter: Payer: Self-pay | Admitting: Gastroenterology

## 2021-05-20 ENCOUNTER — Other Ambulatory Visit: Payer: Self-pay | Admitting: Family Medicine

## 2021-05-20 ENCOUNTER — Ambulatory Visit: Payer: BC Managed Care – PPO | Admitting: Gastroenterology

## 2021-05-20 ENCOUNTER — Encounter: Payer: Self-pay | Admitting: Gastroenterology

## 2021-05-20 VITALS — BP 110/70 | HR 68 | Ht 66.0 in | Wt 152.0 lb

## 2021-05-20 DIAGNOSIS — R159 Full incontinence of feces: Secondary | ICD-10-CM | POA: Diagnosis not present

## 2021-05-20 DIAGNOSIS — R150 Incomplete defecation: Secondary | ICD-10-CM | POA: Diagnosis not present

## 2021-05-20 DIAGNOSIS — Z1211 Encounter for screening for malignant neoplasm of colon: Secondary | ICD-10-CM | POA: Diagnosis not present

## 2021-05-20 MED ORDER — ATORVASTATIN CALCIUM 20 MG PO TABS: 20.0000 mg | ORAL_TABLET | Freq: Every day | ORAL | 3 refills | Status: DC

## 2021-05-20 NOTE — Telephone Encounter (Signed)
  Prescription Request  05/20/2021  What is the name of the medication or equipment? [email or web address]  Have you contacted your pharmacy to request a refill? (if applicable) yes  Which pharmacy would you like this sent to? [facility]   Patient notified that their request is being sent to the clinical staff for review and that they should receive a response within 2 business days.

## 2021-05-20 NOTE — Patient Instructions (Addendum)
If you are age 61 or older, your body mass index should be between 23-30. Your Body mass index is 24.53 kg/m. If this is out of the aforementioned range listed, please consider follow up with your Primary Care Provider.  If you are age 68 or younger, your body mass index should be between 19-25. Your Body mass index is 24.53 kg/m. If this is out of the aformentioned range listed, please consider follow up with your Primary Care Provider.   Start Metamucil once daily.  The [facility] providers would like to encourage you to use [facility] to communicate with providers for non-urgent requests or questions.  Due to long hold times on the telephone, sending your provider a message by [facility] may be a faster and more efficient way to get a response.  Please allow 48 business hours for a response.  Please remember that this is for non-urgent requests.   It was a pleasure to see you today!  Thank you for trusting me with your gastrointestinal care!     Scott E.Candis Schatz, MD

## 2021-05-20 NOTE — Progress Notes (Signed)
HPI : Karen Kaufman is a very pleasant 60 year old female referred by Marjorie Smolder, FNP for further evaluation of fecal incontinence and to discuss screening colonoscopy.  The patient started having difficulty with leakage of stool in February of this year.  Her incontinence was characterized by passage of small amounts of liquid stool in the absence of the urge to defecate.  She would suddenly feel stool coming out would not be able to stop it.  She denied any episodes of large-volume or frank incontinence.  She denies symptoms of urge incontinence.  Her bowel movements were typically characterized by small stools on a daily basis, followed by a large bowel movement every few days.  She saw her primary provider who recommended taking laxatives and so the patient took 2 Dulcolax tablets 1 evening and then had several large bowel movements the next day.  Since then, she has not had any problems with fecal incontinence.  Since then she has been having larger bowel movements on a daily basis.  Her stools are usually formed and soft sometimes on the loose side.  She denies any abdominal pain.  Sometimes she does have some abdominal bloating.  No weight loss.  No urinary symptoms.  The patient had a negative Cologuard in the spring 2021.  She has never had a colonoscopy.  She has no family history of colon cancer.    Past Medical History:  Diagnosis Date   Allergy    Cancer ([place])    skin cancer removed by dermatology   Cataract    Hyperlipidemia      Past Surgical History:  Procedure Laterality Date   ABDOMINAL HYSTERECTOMY  2002   [place]   craniotomy-removal of tumor from behind right eye   BREAST SURGERY  2003   breast implants   EYE SURGERY     bilateral cataract removal   Family History  Problem Relation Age of Onset   Cancer Mother        breast   Diabetes Mother    Hyperlipidemia Mother    Hypertension Mother    Cancer Father        prostate   Arthritis Maternal  Grandmother    Arthritis Maternal Grandfather    Cancer Maternal Grandfather        hodgkins lymphoma   Arthritis Paternal Grandmother    Arthritis Paternal Grandfather    Diabetes Paternal Grandfather    Colon cancer Neg Hx    Pancreatic cancer Neg Hx    Esophageal cancer Neg Hx    Social History   Tobacco Use   Smoking status: Never   Smokeless tobacco: Never  Vaping Use   Vaping Use: Never used  Substance Use Topics   Alcohol use: Not Currently   Drug use: Not Currently   Current Outpatient Medications  Medication Sig Dispense Refill   atorvastatin (LIPITOR) 20 MG tablet Take 1 tablet (20 mg total) by mouth daily. 90 tablet 3   MAGNESIUM PO Take 400 mg by mouth daily.     No current facility-administered medications for this visit.   Allergies  Allergen Reactions   Iodine Anaphylaxis   Shellfish Allergy Anaphylaxis     Review of Systems: All systems reviewed and negative except where noted in HPI.    No results found.  Physical Exam: BP 110/70   Pulse 68   Ht '5\' 6"'$  (1.676 m)   Wt 152 lb (68.9 kg)   SpO2 98%   BMI 24.53 kg/m  Constitutional: Pleasant,well-developed, Caucasian female in no acute distress.  Accompanied by husband HEENT: Normocephalic and atraumatic. Conjunctivae are normal. No scleral icterus.  Mallampati 2 Cardiovascular: Normal rate, regular rhythm.  Pulmonary/chest: Effort normal and breath sounds normal. No wheezing, rales or rhonchi. Abdominal: Soft, nondistended, nontender. Bowel sounds active throughout. There are no masses palpable. No hepatomegaly. Extremities: no edema Neurological: Alert and oriented to person place and time. Skin: Skin is warm and dry. No rashes noted. Psychiatric: Normal mood and affect. Behavior is normal.  CBC    Component Value Date/Time   WBC 5.5 11/28/2020 0817   RBC 4.60 11/28/2020 0817   HGB 14.0 11/28/2020 0817   HCT 41.5 11/28/2020 0817   PLT 238 11/28/2020 0817   MCV 90 11/28/2020 0817   MCH  30.4 11/28/2020 0817   MCHC 33.7 11/28/2020 0817   RDW 12.5 11/28/2020 0817   LYMPHSABS 2.0 11/28/2020 0817   EOSABS 0.1 11/28/2020 0817   BASOSABS 0.0 11/28/2020 0817    CMP     Component Value Date/Time   NA 141 11/28/2020 0817   K 4.9 11/28/2020 0817   CL 101 11/28/2020 0817   CO2 25 11/28/2020 0817   GLUCOSE 94 11/28/2020 0817   BUN 7 (L) 11/28/2020 0817   CREATININE 0.80 11/28/2020 0817   CALCIUM 9.6 11/28/2020 0817   PROT 6.6 11/28/2020 0817   ALBUMIN 4.6 11/28/2020 0817   AST 20 11/28/2020 0817   ALT 12 11/28/2020 0817   ALKPHOS 118 11/28/2020 0817   BILITOT 0.5 11/28/2020 0817   GFRNONAA 80 11/28/2020 0817   GFRAA 92 11/28/2020 0817     ASSESSMENT AND PLAN: 60 year old female with several months of fecal seepage which resolved after taking a laxative.  This suggests her incontinence was secondary to overflow incontinence from constipation.  For the last month or so now, she has had no problems with seepage or incontinence and is having regular formed bowel movements.  There is no need for any further evaluation of her seepage or incontinence.  I recommended she take a daily fiber supplement to keep her regular and prevent this from happening again.  Should her incontinence recur, she can return to clinic for reevaluation. Regarding her colon cancer screening, the patient is up-to-date with her negative Cologuard test.  She will be due for her next colon cancer screening in early 2024.  At that time, she can do another Cologuard or consider a colonoscopy.  Overflow fecal incontinence - Resolved, recommended daily fiber supplement to prevent recurrence  Colon cancer screening - Up-to-date with negative Cologuard in April 2021 - Next screening due in April 2024  Thoams Siefert E. Candis Schatz, MD [facility]

## 2021-05-21 NOTE — Telephone Encounter (Signed)
Pt aware refill sent to pharmacy 

## 2021-05-28 ENCOUNTER — Ambulatory Visit: Payer: BC Managed Care – PPO | Admitting: Family Medicine

## 2021-05-30 ENCOUNTER — Encounter: Payer: Self-pay | Admitting: Family Medicine

## 2021-05-30 ENCOUNTER — Ambulatory Visit: Payer: BC Managed Care – PPO | Admitting: Family Medicine

## 2021-05-30 ENCOUNTER — Other Ambulatory Visit: Payer: Self-pay

## 2021-05-30 VITALS — BP 113/70 | HR 61 | Temp 98.5°F | Ht 66.0 in | Wt 151.1 lb

## 2021-05-30 DIAGNOSIS — R159 Full incontinence of feces: Secondary | ICD-10-CM | POA: Diagnosis not present

## 2021-05-30 DIAGNOSIS — E7800 Pure hypercholesterolemia, unspecified: Secondary | ICD-10-CM | POA: Diagnosis not present

## 2021-05-30 DIAGNOSIS — Z0000 Encounter for general adult medical examination without abnormal findings: Secondary | ICD-10-CM

## 2021-05-30 NOTE — Progress Notes (Signed)
Established Patient Office Visit  Subjective:  Patient ID: Karen Kaufman, female    DOB: [birth year]  Age: 60 y.o. MRN: [id number]  CC:  Chief Complaint  Patient presents with   Hyperlipidemia    HPI Lehua Flores presents for chronic follow up.   HLD She is taking atorvastatin 20 mg daily. She is active at work and tries to take a 15 minutes walk daily at work on her breaks. Reports diet needs to improve.   2. Fecal incontinence Resolved with clean out with dulcolax. She has been using metamucil gummies daily. Reports 1 soft BM daily. Denies pain or bloating, nausea, vomiting, or fever.   Past Medical History:  Diagnosis Date   Allergy    Cancer ([place])    skin cancer removed by dermatology   Cataract    Hyperlipidemia     Past Surgical History:  Procedure Laterality Date   ABDOMINAL HYSTERECTOMY  2002   [place]   craniotomy-removal of tumor from behind right eye   BREAST SURGERY  2003   breast implants   EYE SURGERY     bilateral cataract removal    Family History  Problem Relation Age of Onset   Cancer Mother        breast   Diabetes Mother    Hyperlipidemia Mother    Hypertension Mother    Cancer Father        prostate   Arthritis Maternal Grandmother    Arthritis Maternal Grandfather    Cancer Maternal Grandfather        hodgkins lymphoma   Arthritis Paternal Grandmother    Arthritis Paternal Grandfather    Diabetes Paternal Grandfather    Colon cancer Neg Hx    Pancreatic cancer Neg Hx    Esophageal cancer Neg Hx     Social History   Socioeconomic History   Marital status: Married    Spouse name: Not on file   Number of children: 2   Years of education: 13   Highest education level: Some college, no degree  Occupational History   Occupation: retired  Tobacco Use   Smoking status: Never   Smokeless tobacco: Never  Scientific laboratory technician Use: Never used  Substance and Sexual Activity   Alcohol use: Not Currently   Drug use: Not  Currently   Sexual activity: Yes    Birth control/protection: Surgical  Other Topics Concern   Not on file  Social History Narrative   Not on file   Social Determinants of Health   Financial Resource Strain: Not on file  Food Insecurity: Not on file  Transportation Needs: Not on file  Physical Activity: Not on file  Stress: Not on file  Social Connections: Not on file  Intimate Partner Violence: Not on file    Outpatient Medications Prior to Visit  Medication Sig Dispense Refill   atorvastatin (LIPITOR) 20 MG tablet Take 1 tablet (20 mg total) by mouth daily. 90 tablet 3   MAGNESIUM PO Take 400 mg by mouth daily.     No facility-administered medications prior to visit.    Allergies  Allergen Reactions   Iodine Anaphylaxis   Shellfish Allergy Anaphylaxis    ROS Review of Systems Negative unless specially indicated above in HPI.    Objective:    Physical Exam Vitals and nursing note reviewed.  Constitutional:      General: She is not in acute distress.    Appearance: Normal appearance. She is not ill-appearing,  toxic-appearing or diaphoretic.  Cardiovascular:     Rate and Rhythm: Normal rate and regular rhythm.     Heart sounds: Normal heart sounds. No murmur heard. Pulmonary:     Effort: Pulmonary effort is normal. No respiratory distress.     Breath sounds: Normal breath sounds.  Abdominal:     General: Bowel sounds are normal. There is no distension.     Palpations: Abdomen is soft.     Tenderness: There is no abdominal tenderness. There is no guarding or rebound.  Musculoskeletal:     Right lower leg: No edema.     Left lower leg: No edema.  Skin:    General: Skin is warm and dry.  Neurological:     General: No focal deficit present.     Mental Status: She is alert and oriented to person, place, and time.  Psychiatric:        Mood and Affect: Mood normal.        Behavior: Behavior normal.    BP 113/70   Pulse 61   Temp 98.5 F (36.9 C)  (Temporal)   Ht '5\' 6"'  (1.676 m)   Wt 151 lb 2 oz (68.5 kg)   BMI 24.39 kg/m  Wt Readings from Last 3 Encounters:  05/30/21 151 lb 2 oz (68.5 kg)  05/20/21 152 lb (68.9 kg)  04/04/21 153 lb 6 oz (69.6 kg)     Health Maintenance Due  Topic Date Due   COLONOSCOPY (Pts 45-22yr Insurance coverage will need to be confirmed)  Never done    There are no preventive care reminders to display for this patient.  Lab Results  Component Value Date   TSH 2.690 11/28/2020   Lab Results  Component Value Date   WBC 5.5 11/28/2020   HGB 14.0 11/28/2020   HCT 41.5 11/28/2020   MCV 90 11/28/2020   PLT 238 11/28/2020   Lab Results  Component Value Date   NA 141 11/28/2020   K 4.9 11/28/2020   CO2 25 11/28/2020   GLUCOSE 94 11/28/2020   BUN 7 (L) 11/28/2020   CREATININE 0.80 11/28/2020   BILITOT 0.5 11/28/2020   ALKPHOS 118 11/28/2020   AST 20 11/28/2020   ALT 12 11/28/2020   PROT 6.6 11/28/2020   ALBUMIN 4.6 11/28/2020   CALCIUM 9.6 11/28/2020   Lab Results  Component Value Date   CHOL 148 11/28/2020   Lab Results  Component Value Date   HDL 51 11/28/2020   Lab Results  Component Value Date   LDLCALC 76 11/28/2020   Lab Results  Component Value Date   TRIG 120 11/28/2020   Lab Results  Component Value Date   CHOLHDL 2.9 11/28/2020   No results found for: HGBA1C    Assessment & Plan:   TDenecewas seen today for hyperlipidemia.  Diagnoses and all orders for this visit:  Hypercholesterolemia On statin. Labs pending as below.  -     CBC with Differential/Platelet -     CMP14+EGFR -     Lipid panel  Incontinence of feces, unspecified fecal incontinence type Resolved.   Healthcare maintenance Will pull records for cologuard that was done in 2021. Declined shingrix today.    Follow-up: Return in about 1 year (around 05/30/2022) for CPE .    TGwenlyn Perking FNP

## 2021-05-30 NOTE — Patient Instructions (Signed)
High Cholesterol  High cholesterol is a condition in which the blood has high levels of a white, waxy substance similar to fat (cholesterol). The liver makes all the cholesterol that the body needs. The human body needs small amounts of cholesterol to help build cells. A person gets extra orexcess cholesterol from the food that he or she eats. The blood carries cholesterol from the liver to the rest of the body. If you have high cholesterol, deposits (plaques) may build up on the walls of your arteries. Arteries are the blood vessels that carry blood away from your heart. These plaques make the arteries narrowand stiff. Cholesterol plaques increase your risk for heart attack and stroke. Work withyour health care provider to keep your cholesterol levels in a healthy range. What increases the risk? The following factors may make you more likely to develop this condition: Eating foods that are high in animal fat (saturated fat) or cholesterol. Being overweight. Not getting enough exercise. A family history of high cholesterol (familial hypercholesterolemia). Use of tobacco products. Having diabetes. What are the signs or symptoms? There are no symptoms of this condition. How is this diagnosed? This condition may be diagnosed based on the results of a blood test. If you are older than 61 years of age, your health care provider may check your cholesterol levels every 4-6 years. You may be checked more often if you have high cholesterol or other risk factors for heart disease. The blood test for cholesterol measures: "Bad" cholesterol, or LDL cholesterol. This is the main type of cholesterol that causes heart disease. The desired level is less than 100 mg/dL. "Good" cholesterol, or HDL cholesterol. HDL helps protect against heart disease by cleaning the arteries and carrying the LDL to the liver for processing. The desired level for HDL is 60 mg/dL or higher. Triglycerides. These are fats that your  body can store or burn for energy. The desired level is less than 150 mg/dL. Total cholesterol. This measures the total amount of cholesterol in your blood and includes LDL, HDL, and triglycerides. The desired level is less than 200 mg/dL. How is this treated? This condition may be treated with: Diet changes. You may be asked to eat foods that have more fiber and less saturated fats or added sugar. Lifestyle changes. These may include regular exercise, maintaining a healthy weight, and quitting use of tobacco products. Medicines. These are given when diet and lifestyle changes have not worked. You may be prescribed a statin medicine to help lower your cholesterol levels. Follow these instructions at home: Eating and drinking  Eat a healthy, balanced diet. This diet includes: Daily servings of a variety of fresh, frozen, or canned fruits and vegetables. Daily servings of whole grain foods that are rich in fiber. Foods that are low in saturated fats and trans fats. These include poultry and fish without skin, lean cuts of meat, and low-fat dairy products. A variety of fish, especially oily fish that contain omega-3 fatty acids. Aim to eat fish at least 2 times a week. Avoid foods and drinks that have added sugar. Use healthy cooking methods, such as roasting, grilling, broiling, baking, poaching, steaming, and stir-frying. Do not fry your food except for stir-frying.  Lifestyle  Get regular exercise. Aim to exercise for a total of 150 minutes a week. Increase your activity level by doing activities such as gardening, walking, and taking the stairs. Do not use any products that contain nicotine or tobacco, such as cigarettes, e-cigarettes, and chewing tobacco.   If you need help quitting, ask your health care provider.  General instructions Take over-the-counter and prescription medicines only as told by your health care provider. Keep all follow-up visits as told by your health care provider.  This is important. Where to find more information American Heart Association: www.heart.org National Heart, Lung, and Blood Institute: www.nhlbi.nih.gov Contact a health care provider if: You have trouble achieving or maintaining a healthy diet or weight. You are starting an exercise program. You are unable to stop smoking. Get help right away if: You have chest pain. You have trouble breathing. You have any symptoms of a stroke. "BE FAST" is an easy way to remember the main warning signs of a stroke: B - Balance. Signs are dizziness, sudden trouble walking, or loss of balance. E - Eyes. Signs are trouble seeing or a sudden change in vision. F - Face. Signs are sudden weakness or numbness of the face, or the face or eyelid drooping on one side. A - Arms. Signs are weakness or numbness in an arm. This happens suddenly and usually on one side of the body. S - Speech. Signs are sudden trouble speaking, slurred speech, or trouble understanding what people say. T - Time. Time to call emergency services. Write down what time symptoms started. You have other signs of a stroke, such as: A sudden, severe headache with no known cause. Nausea or vomiting. Seizure. These symptoms may represent a serious problem that is an emergency. Do not wait to see if the symptoms will go away. Get medical help right away. Call your local emergency services (911 in the [place] Do not drive yourself to the hospital. Summary Cholesterol plaques increase your risk for heart attack and stroke. Work with your health care provider to keep your cholesterol levels in a healthy range. Eat a healthy, balanced diet, get regular exercise, and maintain a healthy weight. Do not use any products that contain nicotine or tobacco, such as cigarettes, e-cigarettes, and chewing tobacco. Get help right away if you have any symptoms of a stroke. This information is not intended to replace advice given to you by your health care  provider. Make sure you discuss any questions you have with your healthcare provider. Document Revised: 09/04/2019 Document Reviewed: 09/04/2019 Elsevier Patient Education  2022 Elsevier Inc.  

## 2021-05-31 LAB — CMP14+EGFR
ALT: 13 IU/L (ref 0–32)
AST: 19 IU/L (ref 0–40)
Albumin/Globulin Ratio: 2 (ref 1.2–2.2)
Albumin: 4.4 g/dL (ref 3.8–4.8)
Alkaline Phosphatase: 116 IU/L (ref 44–121)
BUN/Creatinine Ratio: 8 — ABNORMAL LOW (ref 12–28)
BUN: 6 mg/dL — ABNORMAL LOW (ref 8–27)
Bilirubin Total: 0.4 mg/dL (ref 0.0–1.2)
CO2: 24 mmol/L (ref 20–29)
Calcium: 9.2 mg/dL (ref 8.7–10.3)
Chloride: 106 mmol/L (ref 96–106)
Creatinine, Ser: 0.77 mg/dL (ref 0.57–1.00)
Globulin, Total: 2.2 g/dL (ref 1.5–4.5)
Glucose: 89 mg/dL (ref 65–99)
Potassium: 4.7 mmol/L (ref 3.5–5.2)
Sodium: 145 mmol/L — ABNORMAL HIGH (ref 134–144)
Total Protein: 6.6 g/dL (ref 6.0–8.5)
eGFR: 88 mL/min/{1.73_m2} (ref >59)

## 2021-05-31 LAB — CBC WITH DIFFERENTIAL/PLATELET
Basophils Absolute: 0 10*3/uL (ref 0.0–0.2)
Basos: 1 %
EOS (ABSOLUTE): 0.1 10*3/uL (ref 0.0–0.4)
Eos: 2 %
Hematocrit: 40.6 % (ref 34.0–46.6)
Hemoglobin: 13.6 g/dL (ref 11.1–15.9)
Immature Grans (Abs): 0 10*3/uL (ref 0.0–0.1)
Immature Granulocytes: 0 %
Lymphocytes Absolute: 2.2 10*3/uL (ref 0.7–3.1)
Lymphs: 39 %
MCH: 30.2 pg (ref 26.6–33.0)
MCHC: 33.5 g/dL (ref 31.5–35.7)
MCV: 90 fL (ref 79–97)
Monocytes Absolute: 0.3 10*3/uL (ref 0.1–0.9)
Monocytes: 6 %
Neutrophils Absolute: 3.1 10*3/uL (ref 1.4–7.0)
Neutrophils: 52 %
Platelets: 223 10*3/uL (ref 150–450)
RBC: 4.5 x10E6/uL (ref 3.77–5.28)
RDW: 12.6 % (ref 11.7–15.4)
WBC: 5.7 10*3/uL (ref 3.4–10.8)

## 2021-05-31 LAB — LIPID PANEL
Chol/HDL Ratio: 2.2 ratio (ref 0.0–4.4)
Cholesterol, Total: 127 mg/dL (ref 100–199)
HDL: 59 mg/dL (ref >39)
LDL Chol Calc (NIH): 56 mg/dL (ref 0–99)
Triglycerides: 55 mg/dL (ref 0–149)
VLDL Cholesterol Cal: 12 mg/dL (ref 5–40)

## 2022-01-07 ENCOUNTER — Other Ambulatory Visit: Payer: Self-pay | Admitting: Family Medicine

## 2022-01-07 ENCOUNTER — Ambulatory Visit (INDEPENDENT_AMBULATORY_CARE_PROVIDER_SITE_OTHER): Payer: BC Managed Care – PPO

## 2022-01-07 ENCOUNTER — Encounter: Payer: Self-pay | Admitting: Family Medicine

## 2022-01-07 ENCOUNTER — Ambulatory Visit: Payer: BC Managed Care – PPO | Admitting: Family Medicine

## 2022-01-07 VITALS — BP 129/86 | HR 71 | Temp 97.5°F | Ht 66.0 in | Wt 153.5 lb

## 2022-01-07 DIAGNOSIS — K5909 Other constipation: Secondary | ICD-10-CM

## 2022-01-07 DIAGNOSIS — K5900 Constipation, unspecified: Secondary | ICD-10-CM

## 2022-01-07 DIAGNOSIS — Z90710 Acquired absence of both cervix and uterus: Secondary | ICD-10-CM

## 2022-01-07 DIAGNOSIS — R10819 Abdominal tenderness, unspecified site: Secondary | ICD-10-CM

## 2022-01-07 DIAGNOSIS — R159 Full incontinence of feces: Secondary | ICD-10-CM

## 2022-01-07 NOTE — Progress Notes (Signed)
? ?Acute Office Visit ? ?Subjective:  ? ? Patient ID: Karen Kaufman, female    DOB: 1961-02-19, 61 y.o.   MRN: [id number] ? ?Chief Complaint  ?Patient presents with  ? Bloated  ? Constipation  ? ? ?HPI ?Patient is in today for chronic constipation that has been worse for the last 2 months or so. She is not having bowel movements during the week. She does have bowel movements during the weekend if she takes 10-15 mg of dulcolax to have a bowel movement. These bowel movements will with soft and she has a lot cramping when taking dulcolax. She does not having cramping if not taking the dulcolax. She is not taking dulcolax during the week so that she is not dealing with this at work during the week. She is passing gas. She does feel bloated daily and has discomfort laying down at night when she lays on her side. She denies nausea, vomiting, fever, or decreased appetite. She continues to have some fecal incontinence although it has improved from previously. She is trying to improve her water intake. She does eat a lot of fiber. She tried metamucil without improvement.  She often feels bloated.  ? ?She did see GI in August of 2022. Per patient, the GI only spoke with her and did not do an exam. Per the note from GI, the symptoms had resolved with the use of laxatives. GI recommened daily fiber supplement and return if needed. She has and UTD negative cologuard.  ? ?She does not have urinary incontinence, urgency, or frequency. She has had a hysterectomy. ? ?Past Medical History:  ?Diagnosis Date  ? Allergy   ? Cancer [facility])   ? skin cancer removed by dermatology  ? Cataract   ? Hyperlipidemia   ? ? ?Past Surgical History:  ?Procedure Laterality Date  ? ABDOMINAL HYSTERECTOMY  2002  ? BRAIN SURGERY  1995  ? craniotomy-removal of tumor from behind right eye  ? BREAST SURGERY  2003  ? breast implants  ? EYE SURGERY    ? bilateral cataract removal  ? ? ?Family History  ?Problem Relation Age of Onset  ? Cancer Mother   ?      breast  ? Diabetes Mother   ? Hyperlipidemia Mother   ? Hypertension Mother   ? Cancer Father   ?     prostate  ? Arthritis Maternal Grandmother   ? Arthritis Maternal Grandfather   ? Cancer Maternal Grandfather   ?     hodgkins lymphoma  ? Arthritis Paternal Grandmother   ? Arthritis Paternal Grandfather   ? Diabetes Paternal Grandfather   ? Colon cancer Neg Hx   ? Pancreatic cancer Neg Hx   ? Esophageal cancer Neg Hx   ? ? ?Social History  ? ?Socioeconomic History  ? Marital status: Married  ?  Spouse name: Not on file  ? Number of children: 2  ? Years of education: 9  ? Highest education level: Some college, no degree  ?Occupational History  ? Occupation: retired  ?Tobacco Use  ? Smoking status: Never  ? Smokeless tobacco: Never  ?Vaping Use  ? Vaping Use: Never used  ?Substance and Sexual Activity  ? Alcohol use: Not Currently  ? Drug use: Not Currently  ? Sexual activity: Yes  ?  Birth control/protection: Surgical  ?Other Topics Concern  ? Not on file  ?Social History Narrative  ? Not on file  ? ?Social Determinants of Health  ? ?Financial Resource Strain:  Not on file  ?Food Insecurity: Not on file  ?Transportation Needs: Not on file  ?Physical Activity: Not on file  ?Stress: Not on file  ?Social Connections: Not on file  ?Intimate Partner Violence: Not on file  ? ? ?Outpatient Medications Prior to Visit  ?Medication Sig Dispense Refill  ? atorvastatin (LIPITOR) 20 MG tablet Take 1 tablet (20 mg total) by mouth daily. 90 tablet 3  ? bisacodyl (DULCOLAX) 5 MG EC tablet Take 10 mg by mouth daily as needed for moderate constipation.    ? MAGNESIUM PO Take 400 mg by mouth daily.    ? ?No facility-administered medications prior to visit.  ? ? ?Allergies  ?Allergen Reactions  ? Iodine Anaphylaxis  ? Shellfish Allergy Anaphylaxis  ? ? ?Review of Systems ?As per HPI.  ?   ?Objective:  ?  ?Physical Exam ?Vitals and nursing note reviewed.  ?Constitutional:   ?   General: She is not in acute distress. ?   Appearance: She  is not ill-appearing, toxic-appearing or diaphoretic.  ?Pulmonary:  ?   Effort: Pulmonary effort is normal. No respiratory distress.  ?Abdominal:  ?   General: Bowel sounds are normal. There is no distension.  ?   Palpations: Abdomen is soft.  ?   Tenderness: There is abdominal tenderness in the right lower quadrant and left lower quadrant. There is no guarding or rebound. Negative signs include Murphy's sign, Rovsing's sign and McBurney's sign.  ?   Hernia: No hernia is present.  ?Musculoskeletal:  ?   Right lower leg: No edema.  ?   Left lower leg: No edema.  ?Skin: ?   General: Skin is warm and dry.  ?Neurological:  ?   General: No focal deficit present.  ?   Mental Status: She is alert and oriented to person, place, and time.  ?Psychiatric:     ?   Mood and Affect: Mood normal.     ?   Behavior: Behavior normal.  ? ? ?Ht '5\' 6"'  (1.676 m)   Wt 153 lb 8 oz (69.6 kg)   BMI 24.78 kg/m?  ?Wt Readings from Last 3 Encounters:  ?01/07/22 153 lb 8 oz (69.6 kg)  ?05/30/21 151 lb 2 oz (68.5 kg)  ?05/20/21 152 lb (68.9 kg)  ? ? ?Health Maintenance Due  ?Topic Date Due  ? HIV Screening  Never done  ? Hepatitis C Screening  Never done  ? COLONOSCOPY (Pts 45-89yr Insurance coverage will need to be confirmed)  Never done  ? ? ?There are no preventive care reminders to display for this patient. ? ? ?Lab Results  ?Component Value Date  ? TSH 2.690 11/28/2020  ? ?Lab Results  ?Component Value Date  ? WBC 5.7 05/30/2021  ? HGB 13.6 05/30/2021  ? HCT 40.6 05/30/2021  ? MCV 90 05/30/2021  ? PLT 223 05/30/2021  ? ?Lab Results  ?Component Value Date  ? NA 145 (H) 05/30/2021  ? K 4.7 05/30/2021  ? CO2 24 05/30/2021  ? GLUCOSE 89 05/30/2021  ? BUN 6 (L) 05/30/2021  ? CREATININE 0.77 05/30/2021  ? BILITOT 0.4 05/30/2021  ? ALKPHOS 116 05/30/2021  ? AST 19 05/30/2021  ? ALT 13 05/30/2021  ? PROT 6.6 05/30/2021  ? ALBUMIN 4.4 05/30/2021  ? CALCIUM 9.2 05/30/2021  ? EGFR 88 05/30/2021  ? ?Lab Results  ?Component Value Date  ? CHOL 127  05/30/2021  ? ?Lab Results  ?Component Value Date  ? HDL 59 05/30/2021  ? ?  Lab Results  ?Component Value Date  ? [place] 56 05/30/2021  ? ?Lab Results  ?Component Value Date  ? TRIG 55 05/30/2021  ? ?Lab Results  ?Component Value Date  ? CHOLHDL 2.2 05/30/2021  ? ?No results found for: HGBA1C ? ?   ?Assessment & Plan:  ? ?Vannary was seen today for bloated and constipation. ? ?Diagnoses and all orders for this visit: ? ?Chronic constipation ?Lower abdominal tenderness ?Incontinence of feces, unspecified fecal incontinence type ?History of hysterectomy ?Chronic with worsening over last 2 months. No signs of acute abdomen today. KUB with moderate stool burden, no signs of obstruction. Discussed small dose of dulcolax daily with larger dosage or laxative use as needed. Increased fiber and water intake. Discussed possibility of pelvic floor dysfunction given history of hysterectomy, discussed referral to urogynecology for assessment vs pelvic floor PT. Patient would prefer to see urogyn first. Strict return precautions given.  ?-     Cancel: DG Abd 2 Views; Future ?-     Ambulatory referral to Urogynecology ? ?Return for schedule CPE. ? ?The patient indicates understanding of these issues and agrees with the plan. ? ?Gwenlyn Perking, FNP ? ?

## 2022-01-07 NOTE — Patient Instructions (Signed)

## 2022-01-15 ENCOUNTER — Telehealth: Payer: Self-pay | Admitting: Family Medicine

## 2022-01-15 NOTE — Telephone Encounter (Signed)
Pt states she hasn't passed any stool since Saturday 01/10/22 and she is very bloated. Offered appt for tomorrow but pt declined at this time. She wanted to know if she should do and enema? ?

## 2022-01-16 NOTE — Telephone Encounter (Signed)
Yes she can do an OTC enema. If this doesn't produce a BM, she should be evaluated  ?

## 2022-01-16 NOTE — Telephone Encounter (Signed)
Pt aware of provider feedback and voiced understanding. 

## 2022-01-21 IMAGING — DX DG ABDOMEN 1V
2 series · 2 of 2 positions shown · non-contrast
Comparison: None.

CLINICAL DATA: Change in bowel habit, bloating, abdominal pain

EXAM:
ABDOMEN - 1 VIEW

[abdomen kub (1 of 2)]
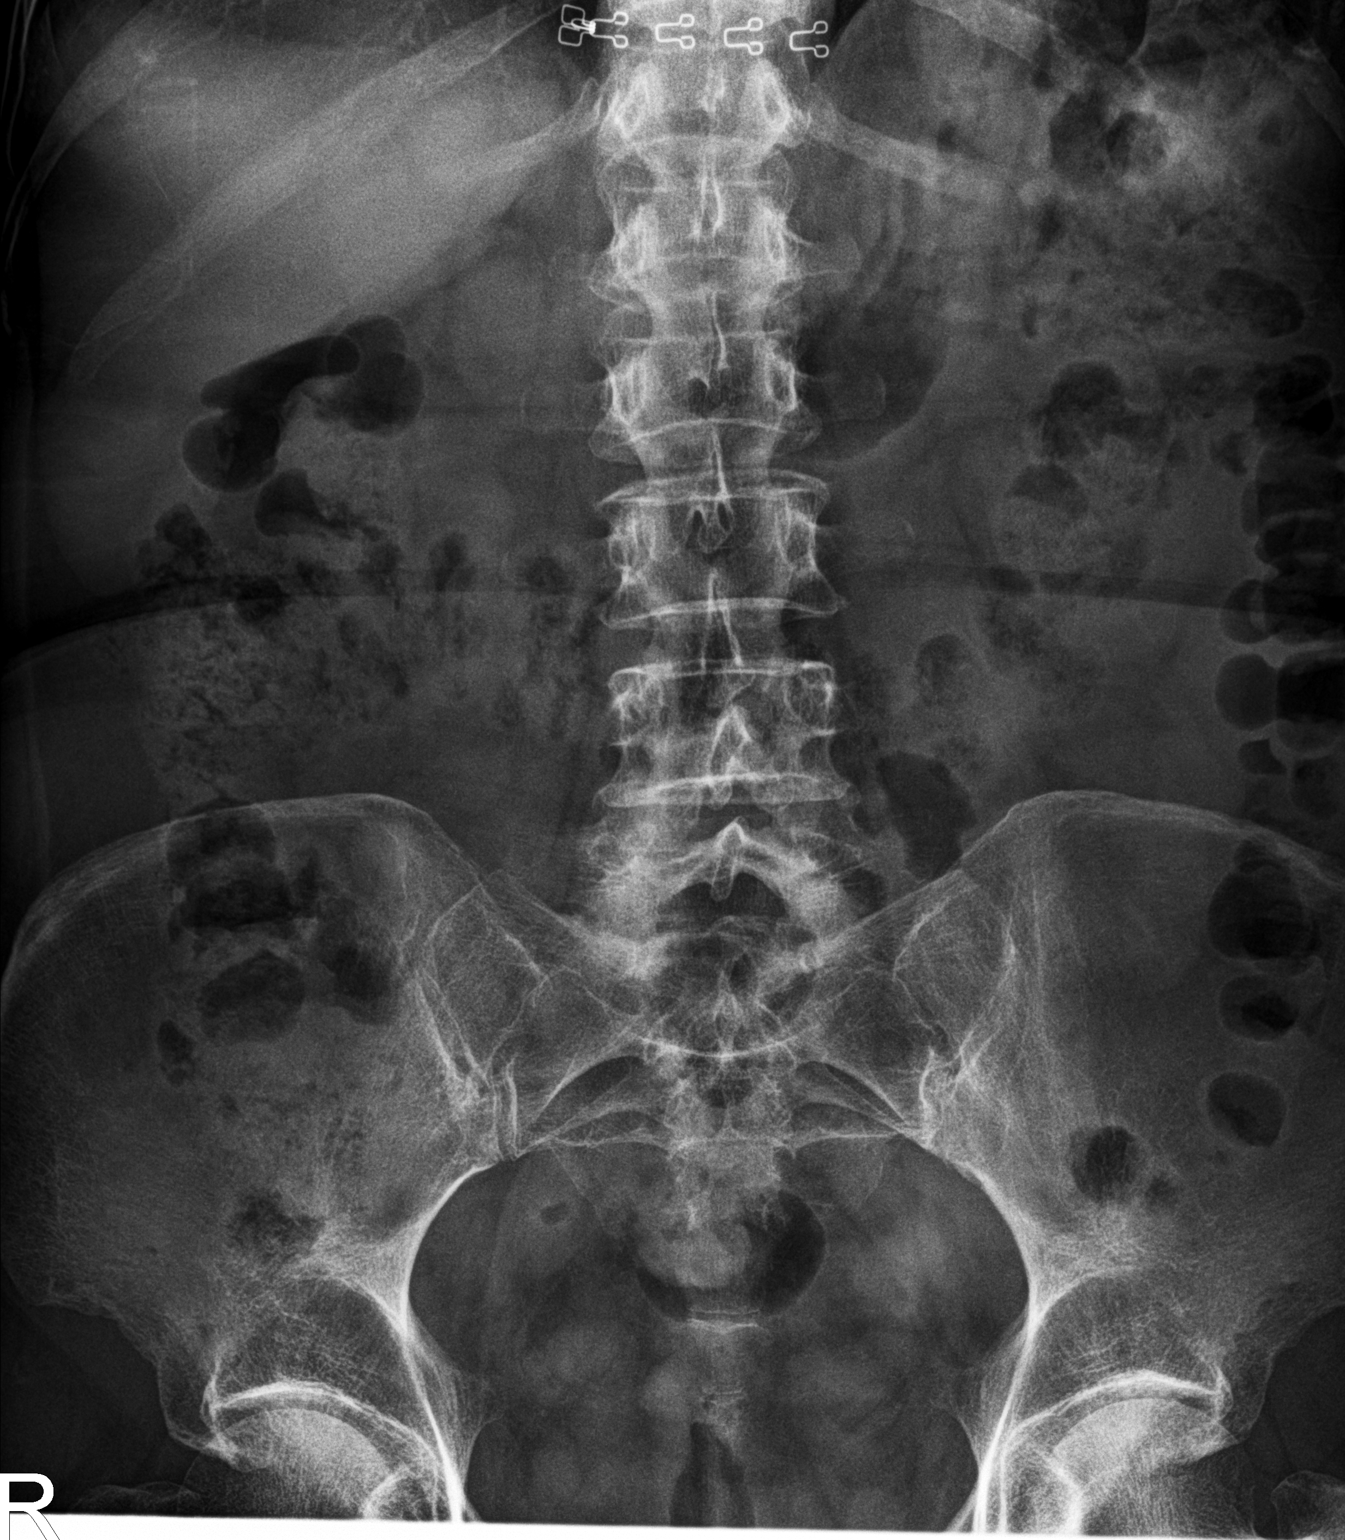

[abdomen kub (2 of 2)]
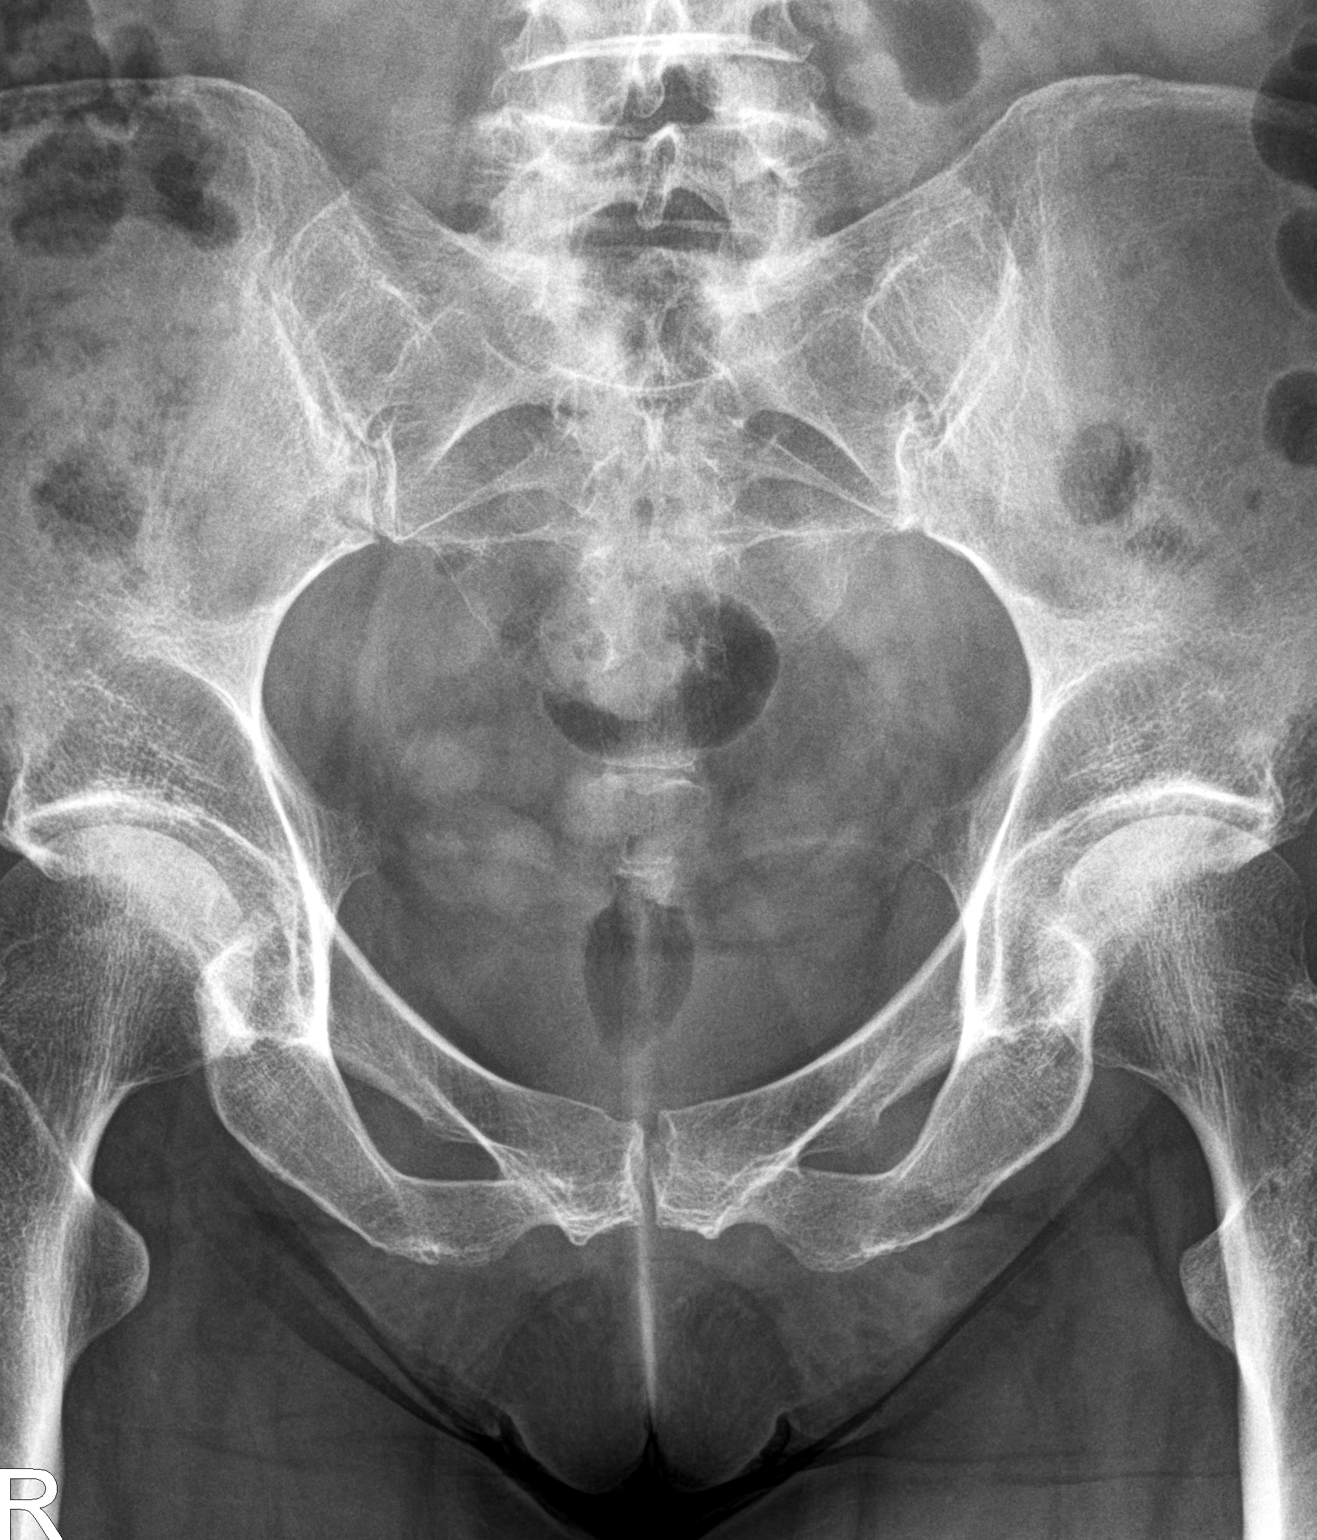

[2 of 2 positions shown; findings below may reference images not displayed]

FINDINGS: 2 supine frontal views of the abdomen and pelvis excludes the
hemidiaphragms and left flank by collimation. Bowel gas pattern is
unremarkable without obstruction or ileus. There are no masses or
abnormal calcifications. No acute bony abnormalities.
IMPRESSION: 1. Unremarkable bowel gas pattern.

## 2022-01-26 ENCOUNTER — Encounter: Payer: Self-pay | Admitting: Family Medicine

## 2022-01-26 ENCOUNTER — Ambulatory Visit (INDEPENDENT_AMBULATORY_CARE_PROVIDER_SITE_OTHER): Payer: BC Managed Care – PPO | Admitting: Family Medicine

## 2022-01-26 VITALS — BP 123/77 | HR 70 | Temp 98.0°F | Ht 66.0 in | Wt 150.8 lb

## 2022-01-26 DIAGNOSIS — E7800 Pure hypercholesterolemia, unspecified: Secondary | ICD-10-CM | POA: Diagnosis not present

## 2022-01-26 DIAGNOSIS — Z0000 Encounter for general adult medical examination without abnormal findings: Secondary | ICD-10-CM | POA: Diagnosis not present

## 2022-01-26 MED ORDER — ATORVASTATIN CALCIUM 20 MG PO TABS: 20.0000 mg | ORAL_TABLET | Freq: Every day | ORAL | 3 refills | Status: AC

## 2022-01-26 NOTE — Patient Instructions (Signed)

## 2022-01-26 NOTE — Progress Notes (Signed)
? ?Karen Kaufman is a 61 y.o. female presents to office today for annual physical exam examination.   ? ?Concerns today include: ?1. None ? ?Marital status: married, Substance use: denies ?Last colonoscopy: cologuard UTD, will have GI appt later this month ?Last mammogram: 01/2021 ?Last pap smear: hysterectomy ? ?Past Medical History:  ?Diagnosis Date  ? Allergy   ? Cancer [facility])   ? skin cancer removed by dermatology  ? Cataract   ? Hyperlipidemia   ? ?Social History  ? ?Socioeconomic History  ? Marital status: Married  ?  Spouse name: Not on file  ? Number of children: 2  ? Years of education: 76  ? Highest education level: Some college, no degree  ?Occupational History  ? Occupation: retired  ?Tobacco Use  ? Smoking status: Never  ? Smokeless tobacco: Never  ?Vaping Use  ? Vaping Use: Never used  ?Substance and Sexual Activity  ? Alcohol use: Not Currently  ? Drug use: Not Currently  ? Sexual activity: Yes  ?  Birth control/protection: Surgical  ?Other Topics Concern  ? Not on file  ?Social History Narrative  ? Not on file  ? ?Social Determinants of Health  ? ?Financial Resource Strain: Not on file  ?Food Insecurity: Not on file  ?Transportation Needs: Not on file  ?Physical Activity: Not on file  ?Stress: Not on file  ?Social Connections: Not on file  ?Intimate Partner Violence: Not on file  ? ?Past Surgical History:  ?Procedure Laterality Date  ? ABDOMINAL HYSTERECTOMY  2002  ? BRAIN SURGERY  1995  ? craniotomy-removal of tumor from behind right eye  ? BREAST SURGERY  2003  ? breast implants  ? EYE SURGERY    ? bilateral cataract removal  ? ?Family History  ?Problem Relation Age of Onset  ? Cancer Mother   ?     breast  ? Diabetes Mother   ? Hyperlipidemia Mother   ? Hypertension Mother   ? Cancer Father   ?     prostate  ? Arthritis Maternal Grandmother   ? Arthritis Maternal Grandfather   ? Cancer Maternal Grandfather   ?     hodgkins lymphoma  ? Arthritis Paternal Grandmother   ? Arthritis Paternal  Grandfather   ? Diabetes Paternal Grandfather   ? Colon cancer Neg Hx   ? Pancreatic cancer Neg Hx   ? Esophageal cancer Neg Hx   ? ? ?Current Outpatient Medications:  ?  atorvastatin (LIPITOR) 20 MG tablet, Take 1 tablet (20 mg total) by mouth daily., Disp: 90 tablet, Rfl: 3 ?  bisacodyl (DULCOLAX) 5 MG EC tablet, Take 10 mg by mouth daily as needed for moderate constipation., Disp: , Rfl:  ?  MAGNESIUM PO, Take 400 mg by mouth daily., Disp: , Rfl:  ? ?Allergies  ?Allergen Reactions  ? Iodine Anaphylaxis  ? Shellfish Allergy Anaphylaxis  ?  ? ?ROS: ?Review of Systems ?Pertinent items noted in HPI and remainder of comprehensive ROS otherwise negative.   ? ?Physical exam ?BP 123/77   Pulse 70   Temp 98 ?F (36.7 ?C) (Temporal)   Ht '5\' 6"'  (1.676 m)   Wt 150 lb 12.8 oz (68.4 kg)   SpO2 98%   BMI 24.34 kg/m?  ?General appearance: alert, cooperative, and no distress ?Head: Normocephalic, without obvious abnormality, atraumatic ?Eyes: conjunctivae/corneas clear. PERRL, EOM's intact. Fundi benign. ?Ears: normal TM's and external ear canals both ears ?Nose: Nares normal. Septum midline. Mucosa normal. No drainage or sinus tenderness. ?  Throat: lips, mucosa, and tongue normal; teeth and gums normal ?Neck: no adenopathy, no carotid bruit, no JVD, supple, symmetrical, trachea midline, and thyroid not enlarged, symmetric, no tenderness/mass/nodules ?Back: symmetric, no curvature. ROM normal. No CVA tenderness. ?Lungs: clear to auscultation bilaterally ?Heart: regular rate and rhythm, S1, S2 normal, no murmur, click, rub or gallop ?Abdomen: soft, non-tender; bowel sounds normal; no masses,  no organomegaly ?Extremities: extremities normal, atraumatic, no cyanosis or edema ?Pulses: 2+ and symmetric ?Skin: Skin color, texture, turgor normal. No rashes or lesions ?Neurologic: Alert and oriented X 3, normal strength and tone. Normal symmetric reflexes. Normal coordination and gait  ? ? ?Assessment/ Plan: ?Karen Kaufman here for  annual physical exam.  ? ?Karen Kaufman was seen today for annual exam. ? ?Diagnoses and all orders for this visit: ? ?Routine general medical examination at a health care facility ?Labs pending as below.  ?-     CBC with Differential/Platelet ?-     CMP14+EGFR ?-     Lipid panel ?-     Thyroid Panel With TSH ?-     VITAMIN D 25 Hydroxy (Vit-D Deficiency, Fractures) ?-     Vitamin B12 ? ?Hypercholesterolemia ?Continue lipitor. Lipid panel pending.  ?-     atorvastatin (LIPITOR) 20 MG tablet; Take 1 tablet (20 mg total) by mouth daily. ? ? ? ? ?Counseled on healthy lifestyle choices, including diet (rich in fruits, vegetables and lean meats and low in salt and simple carbohydrates) and exercise (at least 30 minutes of moderate physical activity daily). ? ?Patient to follow up in 1 year for annual exam or sooner if needed. ? ?The above assessment and management plan was discussed with the patient. The patient verbalized understanding of and has agreed to the management plan. Patient is aware to call the clinic if symptoms persist or worsen. Patient is aware when to return to the clinic for a follow-up visit. Patient educated on when it is appropriate to go to the emergency department.  ? ?Karen Smolder, FNP-C ?[facility] ?[address] ?[place], [place] [address] ?[phone] ? ? ? ? ? ?

## 2022-01-27 ENCOUNTER — Other Ambulatory Visit: Payer: Self-pay | Admitting: Family Medicine

## 2022-01-27 DIAGNOSIS — E559 Vitamin D deficiency, unspecified: Secondary | ICD-10-CM

## 2022-01-27 DIAGNOSIS — E538 Deficiency of other specified B group vitamins: Secondary | ICD-10-CM

## 2022-01-27 LAB — CBC WITH DIFFERENTIAL/PLATELET
Basophils Absolute: 0.1 10*3/uL (ref 0.0–0.2)
Basos: 1 %
EOS (ABSOLUTE): 0.3 10*3/uL (ref 0.0–0.4)
Eos: 5 %
Hematocrit: 41.8 % (ref 34.0–46.6)
Hemoglobin: 13.9 g/dL (ref 11.1–15.9)
Immature Grans (Abs): 0 10*3/uL (ref 0.0–0.1)
Immature Granulocytes: 0 %
Lymphocytes Absolute: 1.8 10*3/uL (ref 0.7–3.1)
Lymphs: 27 %
MCH: 29.8 pg (ref 26.6–33.0)
MCHC: 33.3 g/dL (ref 31.5–35.7)
MCV: 90 fL (ref 79–97)
Monocytes Absolute: 0.4 10*3/uL (ref 0.1–0.9)
Monocytes: 6 %
Neutrophils Absolute: 4 10*3/uL (ref 1.4–7.0)
Neutrophils: 61 %
Platelets: 266 10*3/uL (ref 150–450)
RBC: 4.67 x10E6/uL (ref 3.77–5.28)
RDW: 12.3 % (ref 11.7–15.4)
WBC: 6.5 10*3/uL (ref 3.4–10.8)

## 2022-01-27 LAB — CMP14+EGFR
ALT: 13 IU/L (ref 0–32)
AST: 18 IU/L (ref 0–40)
Albumin/Globulin Ratio: 2.3 — ABNORMAL HIGH (ref 1.2–2.2)
Albumin: 4.5 g/dL (ref 3.8–4.8)
Alkaline Phosphatase: 109 IU/L (ref 44–121)
BUN/Creatinine Ratio: 6 — ABNORMAL LOW (ref 12–28)
BUN: 5 mg/dL — ABNORMAL LOW (ref 8–27)
Bilirubin Total: 0.4 mg/dL (ref 0.0–1.2)
CO2: 25 mmol/L (ref 20–29)
Calcium: 9.4 mg/dL (ref 8.7–10.3)
Chloride: 103 mmol/L (ref 96–106)
Creatinine, Ser: 0.77 mg/dL (ref 0.57–1.00)
Globulin, Total: 2 g/dL (ref 1.5–4.5)
Glucose: 98 mg/dL (ref 70–99)
Potassium: 4.6 mmol/L (ref 3.5–5.2)
Sodium: 143 mmol/L (ref 134–144)
Total Protein: 6.5 g/dL (ref 6.0–8.5)
eGFR: 87 mL/min/{1.73_m2} (ref >59)

## 2022-01-27 LAB — LIPID PANEL
Chol/HDL Ratio: 2.9 ratio (ref 0.0–4.4)
Cholesterol, Total: 146 mg/dL (ref 100–199)
HDL: 50 mg/dL (ref >39)
LDL Chol Calc (NIH): 81 mg/dL (ref 0–99)
Triglycerides: 78 mg/dL (ref 0–149)
VLDL Cholesterol Cal: 15 mg/dL (ref 5–40)

## 2022-01-27 LAB — THYROID PANEL WITH TSH
Free Thyroxine Index: 1.7 (ref 1.2–4.9)
T3 Uptake Ratio: 23 % — ABNORMAL LOW (ref 24–39)
T4, Total: 7.3 ug/dL (ref 4.5–12.0)
TSH: 2.88 u[IU]/mL (ref 0.450–4.500)

## 2022-01-27 LAB — VITAMIN B12: Vitamin B-12: 194 pg/mL — ABNORMAL LOW (ref 232–1245)

## 2022-01-27 LAB — VITAMIN D 25 HYDROXY (VIT D DEFICIENCY, FRACTURES): Vit D, 25-Hydroxy: 19.6 ng/mL — ABNORMAL LOW (ref 30.0–100.0)

## 2022-01-27 MED ORDER — VITAMIN B-12 1000 MCG PO TABS: 1000.0000 ug | ORAL_TABLET | Freq: Every day | ORAL | 1 refills | Status: DC

## 2022-01-27 MED ORDER — VITAMIN D (ERGOCALCIFEROL) 1.25 MG (50000 UNIT) PO CAPS: 50000.0000 [IU] | ORAL_CAPSULE | ORAL | 0 refills | Status: DC

## 2022-01-28 ENCOUNTER — Other Ambulatory Visit: Payer: Self-pay | Admitting: Family Medicine

## 2022-01-28 DIAGNOSIS — Z1231 Encounter for screening mammogram for malignant neoplasm of breast: Secondary | ICD-10-CM

## 2022-01-30 ENCOUNTER — Encounter: Payer: BC Managed Care – PPO | Admitting: Family Medicine

## 2022-02-08 ENCOUNTER — Encounter (HOSPITAL_COMMUNITY): Payer: Self-pay | Admitting: Emergency Medicine

## 2022-02-08 ENCOUNTER — Inpatient Hospital Stay (HOSPITAL_COMMUNITY)
Admission: EM | Admit: 2022-02-08 | Discharge: 2022-02-10 | DRG: 375 | Disposition: A | Discharge status: Home / Self Care | Payer: BC Managed Care – PPO | Attending: Internal Medicine | Admitting: Internal Medicine

## 2022-02-08 ENCOUNTER — Emergency Department (HOSPITAL_COMMUNITY): Payer: BC Managed Care – PPO

## 2022-02-08 ENCOUNTER — Other Ambulatory Visit: Payer: Self-pay

## 2022-02-08 DIAGNOSIS — K56609 Unspecified intestinal obstruction, unspecified as to partial versus complete obstruction: Secondary | ICD-10-CM

## 2022-02-08 DIAGNOSIS — Z79899 Other long term (current) drug therapy: Secondary | ICD-10-CM

## 2022-02-08 DIAGNOSIS — K625 Hemorrhage of anus and rectum: Secondary | ICD-10-CM

## 2022-02-08 DIAGNOSIS — C20 Malignant neoplasm of rectum: Principal | ICD-10-CM | POA: Diagnosis present

## 2022-02-08 DIAGNOSIS — Z90711 Acquired absence of uterus with remaining cervical stump: Secondary | ICD-10-CM

## 2022-02-08 DIAGNOSIS — K5900 Constipation, unspecified: Secondary | ICD-10-CM | POA: Diagnosis present

## 2022-02-08 DIAGNOSIS — R1032 Left lower quadrant pain: Secondary | ICD-10-CM

## 2022-02-08 DIAGNOSIS — K6389 Other specified diseases of intestine: Secondary | ICD-10-CM

## 2022-02-08 DIAGNOSIS — R590 Localized enlarged lymph nodes: Secondary | ICD-10-CM | POA: Diagnosis present

## 2022-02-08 DIAGNOSIS — E7800 Pure hypercholesterolemia, unspecified: Secondary | ICD-10-CM | POA: Diagnosis not present

## 2022-02-08 DIAGNOSIS — Z85828 Personal history of other malignant neoplasm of skin: Secondary | ICD-10-CM

## 2022-02-08 DIAGNOSIS — K6289 Other specified diseases of anus and rectum: Secondary | ICD-10-CM | POA: Diagnosis present

## 2022-02-08 DIAGNOSIS — D123 Benign neoplasm of transverse colon: Secondary | ICD-10-CM

## 2022-02-08 DIAGNOSIS — Z9882 Breast implant status: Secondary | ICD-10-CM

## 2022-02-08 DIAGNOSIS — Z91013 Allergy to seafood: Secondary | ICD-10-CM

## 2022-02-08 DIAGNOSIS — K921 Melena: Secondary | ICD-10-CM

## 2022-02-08 DIAGNOSIS — K769 Liver disease, unspecified: Secondary | ICD-10-CM | POA: Diagnosis present

## 2022-02-08 DIAGNOSIS — Z888 Allergy status to other drugs, medicaments and biological substances status: Secondary | ICD-10-CM

## 2022-02-08 DIAGNOSIS — R933 Abnormal findings on diagnostic imaging of other parts of digestive tract: Secondary | ICD-10-CM

## 2022-02-08 LAB — URINALYSIS, ROUTINE W REFLEX MICROSCOPIC
Bilirubin Urine: NEGATIVE
Glucose, UA: NEGATIVE mg/dL
Hgb urine dipstick: NEGATIVE
Ketones, ur: NEGATIVE mg/dL
Leukocytes,Ua: NEGATIVE
Nitrite: NEGATIVE
Protein, ur: NEGATIVE mg/dL
Specific Gravity, Urine: 1.005 (ref 1.005–1.030)
pH: 7 (ref 5.0–8.0)

## 2022-02-08 LAB — CBC WITH DIFFERENTIAL/PLATELET
Abs Immature Granulocytes: 0.02 10*3/uL (ref 0.00–0.07)
Basophils Absolute: 0.1 10*3/uL (ref 0.0–0.1)
Basophils Relative: 1 %
Eosinophils Absolute: 0.5 10*3/uL (ref 0.0–0.5)
Eosinophils Relative: 8 %
HCT: 42.9 % (ref 36.0–46.0)
Hemoglobin: 14.3 g/dL (ref 12.0–15.0)
Immature Granulocytes: 0 %
Lymphocytes Relative: 25 %
Lymphs Abs: 1.8 10*3/uL (ref 0.7–4.0)
MCH: 30.8 pg (ref 26.0–34.0)
MCHC: 33.3 g/dL (ref 30.0–36.0)
MCV: 92.3 fL (ref 80.0–100.0)
Monocytes Absolute: 0.4 10*3/uL (ref 0.1–1.0)
Monocytes Relative: 6 %
Neutro Abs: 4.2 10*3/uL (ref 1.7–7.7)
Neutrophils Relative %: 60 %
Platelets: 222 10*3/uL (ref 150–400)
RBC: 4.65 MIL/uL (ref 3.87–5.11)
RDW: 11.9 % (ref 11.5–15.5)
WBC: 7 10*3/uL (ref 4.0–10.5)
nRBC: 0 % (ref 0.0–0.2)

## 2022-02-08 LAB — COMPREHENSIVE METABOLIC PANEL
ALT: 10 U/L (ref 0–44)
AST: 18 U/L (ref 15–41)
Albumin: 3.8 g/dL (ref 3.5–5.0)
Alkaline Phosphatase: 92 U/L (ref 38–126)
Anion gap: 13 (ref 5–15)
BUN: 7 mg/dL — ABNORMAL LOW (ref 8–23)
CO2: 23 mmol/L (ref 22–32)
Calcium: 9.3 mg/dL (ref 8.9–10.3)
Chloride: 106 mmol/L (ref 98–111)
Creatinine, Ser: 0.8 mg/dL (ref 0.44–1.00)
GFR, Estimated: >60 mL/min (ref >60)
Glucose, Bld: 95 mg/dL (ref 70–99)
Potassium: 3.5 mmol/L (ref 3.5–5.1)
Sodium: 142 mmol/L (ref 135–145)
Total Bilirubin: 0.9 mg/dL (ref 0.3–1.2)
Total Protein: 6.4 g/dL — ABNORMAL LOW (ref 6.5–8.1)

## 2022-02-08 LAB — LIPASE, BLOOD: Lipase: 32 U/L (ref 11–51)

## 2022-02-08 LAB — POC OCCULT BLOOD, ED: Fecal Occult Bld: POSITIVE — AB

## 2022-02-08 MED ORDER — ACETAMINOPHEN 650 MG RE SUPP: 650.0000 mg | Freq: Four times a day (QID) | RECTAL | Status: DC | PRN

## 2022-02-08 MED ORDER — SODIUM CHLORIDE 0.9 % IV BOLUS (SEPSIS)
1000.0000 mL | Freq: Once | INTRAVENOUS | Status: AC
  Administered 2022-02-08: 1000 mL via INTRAVENOUS

## 2022-02-08 MED ORDER — ONDANSETRON HCL 4 MG/2ML IJ SOLN
4.0000 mg | Freq: Four times a day (QID) | INTRAMUSCULAR | Status: DC | PRN
  Administered 2022-02-08 – 2022-02-09 (×2): 4 mg via INTRAVENOUS
  Filled 2022-02-08 (×2): qty 2

## 2022-02-08 MED ORDER — PEG-KCL-NACL-NASULF-NA ASC-C 100 G PO SOLR
0.5000 | Freq: Once | ORAL | Status: DC
Start: 2022-02-09 — End: 2022-02-09

## 2022-02-08 MED ORDER — PEG-KCL-NACL-NASULF-NA ASC-C 100 G PO SOLR
0.5000 | Freq: Once | ORAL | Status: AC
  Administered 2022-02-08: 100 g via ORAL
  Filled 2022-02-08: qty 1

## 2022-02-08 MED ORDER — SODIUM CHLORIDE 0.9 % IV SOLN: INTRAVENOUS | Status: DC

## 2022-02-08 MED ORDER — SODIUM CHLORIDE 0.9% FLUSH
3.0000 mL | Freq: Two times a day (BID) | INTRAVENOUS | Status: DC
  Administered 2022-02-08 – 2022-02-09 (×2): 3 mL via INTRAVENOUS

## 2022-02-08 MED ORDER — PEG-KCL-NACL-NASULF-NA ASC-C 100 G PO SOLR: 1.0000 | Freq: Two times a day (BID) | ORAL | Status: DC

## 2022-02-08 MED ORDER — ACETAMINOPHEN 325 MG PO TABS
650.0000 mg | ORAL_TABLET | Freq: Four times a day (QID) | ORAL | Status: DC | PRN
  Administered 2022-02-08: 650 mg via ORAL
  Filled 2022-02-08: qty 2

## 2022-02-08 MED ORDER — SODIUM CHLORIDE 0.9 % IV SOLN
1000.0000 mL | INTRAVENOUS | Status: DC
  Administered 2022-02-08 – 2022-02-10 (×5): 1000 mL via INTRAVENOUS

## 2022-02-08 NOTE — ED Provider Notes (Signed)
6:00 PM ?Care of the patient assumed at signout.  With abnormal CT results and discussed these with the patient and her husband.  I discussed her results with our surgeon and our gastroenterology team as well.  Patient will be admitted for planned colonoscopy tomorrow given concern of rectal mass on CT with possible lymph node involvement consideration of malignancy.  Patient, husband aware, all questions answered.  Patient admitted to the hospitalist team with GI following. ?  ?Carmin Muskrat, MD ?02/08/22 1801 ? ?

## 2022-02-08 NOTE — Progress Notes (Addendum)
I was called by ER. Dr. Vanita Panda.  She has a mass in distal colon and probable malignant nearby adenopathy and possible mets in the liver. ? ?She is being admitted by hospiitalist. I've put in orders for a colonoscopy with a split dose prep to start asap.  Clear liquids for now.  NPO around 5am when she should be completed with her second 'dose' of the prep. ? ?Formal consult to follow (likely tomorrow AM). ? ?Call me with any questions before then ? ? ?

## 2022-02-08 NOTE — H&P (Signed)
?History and Physical  ? ?Karen Kaufman SJG:[id number] DOB: May 18, 1961 DOA: 02/08/2022 ? ?PCP: Gwenlyn Perking, FNP  ? ?Patient coming from: Home ? ?Chief Complaint: Rectal bleeding ? ?HPI: Karen Kaufman is a 61 y.o. female with medical history significant of constipation, hyperlipidemia presenting with rectal bleeding. ? ?Patient reports episode of rectal bleeding this morning.  Has a history of constipation on stool softeners.  States due to work only takes his softeners on the weekends.  Has not had any rectal bleeding previously.  Does report a couple weeks of lower abdominal achy sensation that is intermittently improved with heating pad. ? ?Denies fevers chills chest pain shortness of breath, nausea, vomiting. ? ?ED Course: Vital signs in the ED stable.  Lab work-up included CMP with protein 6.4.  CBC within normal limits.  FOBT heme positive.  Lipase normal.  Urinalysis pending.  CT of the abdomen pelvis showed severe wall thickening of distal sigmoid colon and rectum with concern for rectal mass.  Enlarged perirectal and retroperitoneal lymph nodes.  Multiple ill-defined liver lesions.  Trace ascites.  Patient received a liter of fluids and started on a rate of fluids in the ED.  GI was consulted and recommended bowel prep which has been ordered and plan for colonoscopy tomorrow.  Clear liquids for now.  General surgery also consulted but states there is no role for surgical intervention at this time. ? ?Review of Systems: As per HPI otherwise all other systems reviewed and are negative. ? ?Past Medical History:  ?Diagnosis Date  ? Allergy   ? Cancer [facility])   ? skin cancer removed by dermatology  ? Cataract   ? Hyperlipidemia   ? ? ?Past Surgical History:  ?Procedure Laterality Date  ? ABDOMINAL HYSTERECTOMY  2002  ? BRAIN SURGERY  1995  ? craniotomy-removal of tumor from behind right eye  ? BREAST SURGERY  2003  ? breast implants  ? EYE SURGERY    ? bilateral cataract removal  ? ? ?Social History ? reports that  she has never smoked. She has never used smokeless tobacco. She reports that she does not currently use alcohol. She reports that she does not currently use drugs. ? ?Allergies  ?Allergen Reactions  ? Iodine Anaphylaxis  ? Shellfish Allergy Anaphylaxis  ? ? ?Family History  ?Problem Relation Age of Onset  ? Cancer Mother   ?     breast  ? Diabetes Mother   ? Hyperlipidemia Mother   ? Hypertension Mother   ? Cancer Father   ?     prostate  ? Arthritis Maternal Grandmother   ? Arthritis Maternal Grandfather   ? Cancer Maternal Grandfather   ?     hodgkins lymphoma  ? Arthritis Paternal Grandmother   ? Arthritis Paternal Grandfather   ? Diabetes Paternal Grandfather   ? Colon cancer Neg Hx   ? Pancreatic cancer Neg Hx   ? Esophageal cancer Neg Hx   ?Reviewed on admission ? ?Prior to Admission medications   ?Medication Sig Start Date End Date Taking? Authorizing Provider  ?atorvastatin (LIPITOR) 20 MG tablet Take 1 tablet (20 mg total) by mouth daily. 01/26/22   Gwenlyn Perking, FNP  ?bisacodyl (DULCOLAX) 5 MG EC tablet Take 10 mg by mouth daily as needed for moderate constipation.    [provider]  ?MAGNESIUM PO Take 400 mg by mouth daily.    [provider]  ?vitamin B-12 (CYANOCOBALAMIN) 1000 MCG tablet Take 1 tablet (1,000 mcg total) by mouth  daily. 01/27/22   Gwenlyn Perking, FNP  ?Vitamin D, Ergocalciferol, (DRISDOL) 1.25 MG (50000 UNIT) CAPS capsule Take 1 capsule (50,000 Units total) by mouth every 7 (seven) days. 01/27/22   Gwenlyn Perking, FNP  ? ? ?Physical Exam: ?Vitals:  ? 02/08/22 1530 02/08/22 1545 02/08/22 1808 02/08/22 1830  ?BP: 121/76  (!) 121/103 128/75  ?Pulse: 82  81 82  ?Resp: (!) 21  (!) 22 20  ?Temp:  97.8 ?F (36.6 ?C)    ?SpO2: 98%  100% 98%  ? ? ?Physical Exam ?Constitutional:   ?   General: She is not in acute distress. ?   Appearance: Normal appearance.  ?HENT:  ?   Head: Normocephalic and atraumatic.  ?   Mouth/Throat:  ?   Mouth: Mucous membranes are moist.  ?    Pharynx: Oropharynx is clear.  ?Eyes:  ?   Extraocular Movements: Extraocular movements intact.  ?   Pupils: Pupils are equal, round, and reactive to light.  ?Cardiovascular:  ?   Rate and Rhythm: Normal rate and regular rhythm.  ?   Pulses: Normal pulses.  ?   Heart sounds: Normal heart sounds.  ?Pulmonary:  ?   Effort: Pulmonary effort is normal. No respiratory distress.  ?   Breath sounds: Normal breath sounds.  ?Abdominal:  ?   General: Bowel sounds are normal. There is no distension.  ?   Palpations: Abdomen is soft.  ?   Tenderness: There is no abdominal tenderness.  ?Musculoskeletal:     ?   General: No swelling or deformity.  ?Skin: ?   General: Skin is warm and dry.  ?Neurological:  ?   General: No focal deficit present.  ?   Mental Status: Mental status is at baseline.  ? ?Labs on Admission: I have personally reviewed following labs and imaging studies ? ?CBC: ?Recent Labs  ?Lab 02/08/22 ?1500  ?WBC 7.0  ?NEUTROABS 4.2  ?HGB 14.3  ?HCT 42.9  ?MCV 92.3  ?PLT 222  ? ? ?Basic Metabolic Panel: ?Recent Labs  ?Lab 02/08/22 ?1500  ?NA 142  ?K 3.5  ?CL 106  ?CO2 23  ?GLUCOSE 95  ?BUN 7*  ?CREATININE 0.80  ?CALCIUM 9.3  ? ? ?GFR: ?Estimated Creatinine Clearance: 68.3 mL/min (by C-G formula based on SCr of 0.8 mg/dL). ? ?Liver Function Tests: ?Recent Labs  ?Lab 02/08/22 ?1500  ?AST 18  ?ALT 10  ?ALKPHOS 92  ?BILITOT 0.9  ?PROT 6.4*  ?ALBUMIN 3.8  ? ? ?Urine analysis: ?   ?Component Value Date/Time  ? COLORURINE STRAW (A) 02/08/2022 1808  ? APPEARANCEUR CLEAR 02/08/2022 1808  ? LABSPEC 1.005 02/08/2022 1808  ? PHURINE 7.0 02/08/2022 1808  ? GLUCOSEU NEGATIVE 02/08/2022 1808  ? [place] NEGATIVE 02/08/2022 1808  ? [place] NEGATIVE 02/08/2022 1808  ? [place] Stain NEGATIVE 02/08/2022 1808  ? PROTEINUR NEGATIVE 02/08/2022 1808  ? NITRITE NEGATIVE 02/08/2022 1808  ? LEUKOCYTESUR NEGATIVE 02/08/2022 1808  ? ? ?Radiological Exams on Admission: ?CT ABDOMEN PELVIS WO CONTRAST ? ?Result Date: 02/08/2022 ?CLINICAL DATA:   Diverticulitis, complication suspected * Tracking Code: BO * EXAM: CT ABDOMEN AND PELVIS WITHOUT CONTRAST TECHNIQUE: Multidetector CT imaging of the abdomen and pelvis was performed following the standard protocol without IV contrast. RADIATION DOSE REDUCTION: This exam was performed according to the departmental dose-optimization program which includes automated exposure control, adjustment of the mA and/or kV according to patient size and/or use of iterative reconstruction technique. COMPARISON:  None. FINDINGS: Lower chest: No acute abnormality.  Hepatobiliary: Multiple very ill-defined, hypodense liver lesions (series 5, image 26, 21). No gallstones, gallbladder wall thickening, or biliary dilatation. Pancreas: Unremarkable. No pancreatic ductal dilatation or surrounding inflammatory changes. Spleen: Normal in size without significant abnormality. Adrenals/Urinary Tract: Adrenal glands are unremarkable. Kidneys are normal, without renal calculi, solid lesion, or hydronephrosis. Bladder is unremarkable. Stomach/Bowel: Stomach is within normal limits. Appendix appears normal. Severe circumferential wall thickening of the distal sigmoid colon and rectum (series 5, image 69, series 3, image 82). Occasional sigmoid diverticula, which do not appear to be specifically involved. Vascular/Lymphatic: No significant vascular findings are present. Enlarged perirectal lymph nodes measuring up to 2.1 x 2.0 cm (series 5, image 61). Enlarged low left retroperitoneal nodes measuring up to 1.3 x 1.2 cm. Reproductive: Status post hysterectomy. Other: No abdominal wall hernia or abnormality. Trace ascites in the low pelvis. Musculoskeletal: No acute or significant osseous findings. IMPRESSION: 1. Severe circumferential wall thickening of the distal sigmoid colon and rectum. This appearance is highly concerning for rectal mass. 2. Enlarged perirectal and low left retroperitoneal lymph nodes, although possibly reactive highly  concerning for nodal metastatic disease. 3. Multiple ill-defined hypodense liver lesions, highly concerning for hepatic metastases. 4. Trace ascites in the low pelvis. Electronically Signed   By: Delanna Ahmadi M.D.   On:

## 2022-02-08 NOTE — ED Triage Notes (Signed)
Pt arrives c/o BRB on tissue after BM today. Pt endorses intermittent constipation for several months. Pt endorses slight discomfort to lower abdomen. Denies tarry stools, N/V/D. Pt appears in NAD.  ?

## 2022-02-08 NOTE — ED Provider Notes (Signed)
?[facility] ?Provider Note ? ? ?CSN: [id number] ?Arrival date & time: 02/08/22  1419 ? ?  ? ?History ? ?Chief Complaint  ?Patient presents with  ? Blood In Stools  ? ? ?Karen Kaufman is a 61 y.o. female. ? ?HPI ? ?Patient has a history of hyperlipidemia, cataracts, skin cancer and constipation who presents to the ED with complaints of rectal bleeding.  Patient states she has a history of constipation.  She saw her primary doctor who recommended an stool softeners.  Patient states however because of work she only takes them on the weekend.  Patient this morning went to have a BM and noticed bright red blood but no stool.  Patient also has had some pain in her left lower abdomen.  She denies any fevers or chills.  No vomiting.. ? ?Home Medications ?Prior to Admission medications   ?Medication Sig Start Date End Date Taking? Authorizing Provider  ?atorvastatin (LIPITOR) 20 MG tablet Take 1 tablet (20 mg total) by mouth daily. 01/26/22   Gwenlyn Perking, FNP  ?bisacodyl (DULCOLAX) 5 MG EC tablet Take 10 mg by mouth daily as needed for moderate constipation.    [provider]  ?MAGNESIUM PO Take 400 mg by mouth daily.    [provider]  ?vitamin B-12 (CYANOCOBALAMIN) 1000 MCG tablet Take 1 tablet (1,000 mcg total) by mouth daily. 01/27/22   Gwenlyn Perking, FNP  ?Vitamin D, Ergocalciferol, (DRISDOL) 1.25 MG (50000 UNIT) CAPS capsule Take 1 capsule (50,000 Units total) by mouth every 7 (seven) days. 01/27/22   Gwenlyn Perking, FNP  ?   ? ?Allergies    ?Iodine and Shellfish allergy   ? ?Review of Systems   ?Review of Systems  ?Constitutional:  Negative for fever.  ? ?Physical Exam ?Updated Vital Signs ?BP 121/76   Pulse 82   Temp 97.8 ?F (36.6 ?C)   Resp (!) 21   SpO2 98%  ?Physical Exam ?Vitals and nursing note reviewed.  ?Constitutional:   ?   General: She is not in acute distress. ?   Appearance: She is well-developed.  ?HENT:  ?   Head: Normocephalic and  atraumatic.  ?   Right Ear: External ear normal.  ?   Left Ear: External ear normal.  ?Eyes:  ?   General: No scleral icterus.    ?   Right eye: No discharge.     ?   Left eye: No discharge.  ?   Conjunctiva/sclera: Conjunctivae normal.  ?Neck:  ?   Trachea: No tracheal deviation.  ?Cardiovascular:  ?   Rate and Rhythm: Normal rate and regular rhythm.  ?Pulmonary:  ?   Effort: Pulmonary effort is normal. No respiratory distress.  ?   Breath sounds: Normal breath sounds. No stridor. No wheezing or rales.  ?Abdominal:  ?   General: Bowel sounds are normal. There is no distension.  ?   Palpations: Abdomen is soft.  ?   Tenderness: There is no abdominal tenderness. There is no guarding or rebound.  ?   Comments: Mild tenderness left lower quadrant abdomen soft no guarding  ?Genitourinary: ?   Comments: No hemorrhoid or mass palpated, small amount of blood-tinged mucus noted on rectal exam ?Musculoskeletal:     ?   General: No tenderness or deformity.  ?   Cervical back: Neck supple.  ?Skin: ?   General: Skin is warm and dry.  ?   Findings: No rash.  ?Neurological:  ?  General: No focal deficit present.  ?   Mental Status: She is alert.  ?   Cranial Nerves: No cranial nerve deficit (no facial droop, extraocular movements intact, no slurred speech).  ?   Sensory: No sensory deficit.  ?   Motor: No abnormal muscle tone or seizure activity.  ?   Coordination: Coordination normal.  ?Psychiatric:     ?   Mood and Affect: Mood normal.  ? ? ?ED Results / Procedures / Treatments   ?Labs ?(all labs ordered are listed, but only abnormal results are displayed) ?Labs Reviewed  ?POC OCCULT BLOOD, ED - Abnormal; Notable for the following components:  ?    Result Value  ? Fecal Occult Bld POSITIVE (*)   ? All other components within normal limits  ?CBC WITH DIFFERENTIAL/PLATELET  ?COMPREHENSIVE METABOLIC PANEL  ?LIPASE, BLOOD  ?URINALYSIS, ROUTINE W REFLEX MICROSCOPIC  ? ? ?EKG ?None ? ?Radiology ?No results  found. ? ?Procedures ?Procedures  ? ? ?Medications Ordered in ED ?Medications  ?sodium chloride 0.9 % bolus 1,000 mL (has no administration in time range)  ?  Followed by  ?0.9 %  sodium chloride infusion (has no administration in time range)  ? ? ?ED Course/ Medical Decision Making/ A&P ?  ?                        ?Medical Decision Making ?Amount and/or Complexity of Data Reviewed ?Labs: ordered. ? ?Risk ?Prescription drug management. ? ? ?Patient presented to the ED with complaints of rectal bleeding and lower abdominal pain.  No fecal impaction noted on exam.  No external hemorrhoid noted.  Diverticulitis diverticulosis is a concern and with her lower abdominal discomfort and rectal bleeding we will proceed with CT scan.  Care turned over to Dr Vanita Panda. ? ? ? ? ? ? ? ?Final Clinical Impression(s) / ED Diagnoses ?pending ? ? ?  ?Dorie Rank, MD ?02/08/22 1548 ? ?

## 2022-02-09 ENCOUNTER — Observation Stay (HOSPITAL_COMMUNITY): Payer: BC Managed Care – PPO | Admitting: Certified Registered Nurse Anesthetist

## 2022-02-09 ENCOUNTER — Other Ambulatory Visit: Payer: Self-pay | Admitting: *Deleted

## 2022-02-09 ENCOUNTER — Encounter (HOSPITAL_COMMUNITY)
Admission: EM | Disposition: A | Discharge status: Home / Self Care | Payer: Self-pay | Source: Home / Self Care | Attending: Internal Medicine

## 2022-02-09 ENCOUNTER — Encounter: Payer: Self-pay | Admitting: *Deleted

## 2022-02-09 ENCOUNTER — Encounter (HOSPITAL_COMMUNITY): Payer: Self-pay | Admitting: Internal Medicine

## 2022-02-09 ENCOUNTER — Other Ambulatory Visit: Payer: Self-pay

## 2022-02-09 DIAGNOSIS — R1030 Lower abdominal pain, unspecified: Secondary | ICD-10-CM

## 2022-02-09 DIAGNOSIS — Z91013 Allergy to seafood: Secondary | ICD-10-CM | POA: Diagnosis not present

## 2022-02-09 DIAGNOSIS — K6289 Other specified diseases of anus and rectum: Secondary | ICD-10-CM

## 2022-02-09 DIAGNOSIS — K56691 Other complete intestinal obstruction: Secondary | ICD-10-CM | POA: Diagnosis not present

## 2022-02-09 DIAGNOSIS — Z9882 Breast implant status: Secondary | ICD-10-CM | POA: Diagnosis not present

## 2022-02-09 DIAGNOSIS — C20 Malignant neoplasm of rectum: Secondary | ICD-10-CM | POA: Diagnosis present

## 2022-02-09 DIAGNOSIS — R1032 Left lower quadrant pain: Secondary | ICD-10-CM

## 2022-02-09 DIAGNOSIS — Z85828 Personal history of other malignant neoplasm of skin: Secondary | ICD-10-CM | POA: Diagnosis not present

## 2022-02-09 DIAGNOSIS — K56609 Unspecified intestinal obstruction, unspecified as to partial versus complete obstruction: Secondary | ICD-10-CM | POA: Diagnosis not present

## 2022-02-09 DIAGNOSIS — K635 Polyp of colon: Secondary | ICD-10-CM | POA: Diagnosis not present

## 2022-02-09 DIAGNOSIS — K5900 Constipation, unspecified: Secondary | ICD-10-CM | POA: Diagnosis present

## 2022-02-09 DIAGNOSIS — K6389 Other specified diseases of intestine: Secondary | ICD-10-CM | POA: Diagnosis not present

## 2022-02-09 DIAGNOSIS — C19 Malignant neoplasm of rectosigmoid junction: Secondary | ICD-10-CM | POA: Diagnosis not present

## 2022-02-09 DIAGNOSIS — D123 Benign neoplasm of transverse colon: Secondary | ICD-10-CM

## 2022-02-09 DIAGNOSIS — R590 Localized enlarged lymph nodes: Secondary | ICD-10-CM | POA: Diagnosis present

## 2022-02-09 DIAGNOSIS — K921 Melena: Secondary | ICD-10-CM | POA: Diagnosis present

## 2022-02-09 DIAGNOSIS — E7800 Pure hypercholesterolemia, unspecified: Secondary | ICD-10-CM | POA: Diagnosis present

## 2022-02-09 DIAGNOSIS — Z888 Allergy status to other drugs, medicaments and biological substances status: Secondary | ICD-10-CM | POA: Diagnosis not present

## 2022-02-09 DIAGNOSIS — R933 Abnormal findings on diagnostic imaging of other parts of digestive tract: Secondary | ICD-10-CM | POA: Diagnosis not present

## 2022-02-09 DIAGNOSIS — Z79899 Other long term (current) drug therapy: Secondary | ICD-10-CM | POA: Diagnosis not present

## 2022-02-09 DIAGNOSIS — K769 Liver disease, unspecified: Secondary | ICD-10-CM | POA: Diagnosis present

## 2022-02-09 DIAGNOSIS — Z90711 Acquired absence of uterus with remaining cervical stump: Secondary | ICD-10-CM | POA: Diagnosis not present

## 2022-02-09 HISTORY — PX: POLYPECTOMY: SHX5525

## 2022-02-09 HISTORY — PX: COLONOSCOPY WITH PROPOFOL: SHX5780

## 2022-02-09 HISTORY — PX: FLEXIBLE SIGMOIDOSCOPY: SHX5431

## 2022-02-09 HISTORY — PX: BOWEL DECOMPRESSION: SHX5532

## 2022-02-09 LAB — COMPREHENSIVE METABOLIC PANEL
ALT: 10 U/L (ref 0–44)
AST: 16 U/L (ref 15–41)
Albumin: 3.4 g/dL — ABNORMAL LOW (ref 3.5–5.0)
Alkaline Phosphatase: 86 U/L (ref 38–126)
Anion gap: 4 — ABNORMAL LOW (ref 5–15)
BUN: <5 mg/dL — ABNORMAL LOW (ref 8–23)
CO2: 26 mmol/L (ref 22–32)
Calcium: 8.5 mg/dL — ABNORMAL LOW (ref 8.9–10.3)
Chloride: 111 mmol/L (ref 98–111)
Creatinine, Ser: 0.69 mg/dL (ref 0.44–1.00)
GFR, Estimated: >60 mL/min (ref >60)
Glucose, Bld: 103 mg/dL — ABNORMAL HIGH (ref 70–99)
Potassium: 3.5 mmol/L (ref 3.5–5.1)
Sodium: 141 mmol/L (ref 135–145)
Total Bilirubin: 0.5 mg/dL (ref 0.3–1.2)
Total Protein: 5.8 g/dL — ABNORMAL LOW (ref 6.5–8.1)

## 2022-02-09 LAB — CBC
HCT: 37 % (ref 36.0–46.0)
Hemoglobin: 12.4 g/dL (ref 12.0–15.0)
MCH: 30.4 pg (ref 26.0–34.0)
MCHC: 33.5 g/dL (ref 30.0–36.0)
MCV: 90.7 fL (ref 80.0–100.0)
Platelets: 218 10*3/uL (ref 150–400)
RBC: 4.08 MIL/uL (ref 3.87–5.11)
RDW: 11.7 % (ref 11.5–15.5)
WBC: 6.7 10*3/uL (ref 4.0–10.5)
nRBC: 0 % (ref 0.0–0.2)

## 2022-02-09 SURGERY — SIGMOIDOSCOPY, FLEXIBLE

## 2022-02-09 SURGERY — COLONOSCOPY WITH PROPOFOL
Anesthesia: Monitor Anesthesia Care

## 2022-02-09 MED ORDER — DICYCLOMINE HCL 10 MG PO CAPS
20.0000 mg | ORAL_CAPSULE | Freq: Four times a day (QID) | ORAL | Status: DC | PRN
  Administered 2022-02-09: 20 mg via ORAL
  Filled 2022-02-09: qty 2

## 2022-02-09 MED ORDER — LACTATED RINGERS IV SOLN: INTRAVENOUS | Status: DC | PRN

## 2022-02-09 MED ORDER — PEG-KCL-NACL-NASULF-NA ASC-C 100 G PO SOLR
0.5000 | Freq: Once | ORAL | Status: AC
  Administered 2022-02-09: 100 g via ORAL
  Filled 2022-02-09: qty 1

## 2022-02-09 MED ORDER — PROPOFOL 500 MG/50ML IV EMUL
INTRAVENOUS | Status: DC | PRN
Start: 2022-02-09 — End: 2022-02-09
  Administered 2022-02-09: 125 ug/kg/min via INTRAVENOUS

## 2022-02-09 MED ORDER — SIMETHICONE 80 MG PO CHEW
80.0000 mg | CHEWABLE_TABLET | Freq: Once | ORAL | Status: AC
  Administered 2022-02-09: 80 mg via ORAL
  Filled 2022-02-09: qty 1

## 2022-02-09 MED ORDER — PROPOFOL 10 MG/ML IV BOLUS
INTRAVENOUS | Status: DC | PRN
  Administered 2022-02-09: 10 mg via INTRAVENOUS
  Administered 2022-02-09: 20 mg via INTRAVENOUS
  Administered 2022-02-09: 10 mg via INTRAVENOUS
  Administered 2022-02-09: 20 mg via INTRAVENOUS
  Administered 2022-02-09: 15 mg via INTRAVENOUS

## 2022-02-09 MED ORDER — HYOSCYAMINE SULFATE 0.125 MG SL SUBL
0.2500 mg | SUBLINGUAL_TABLET | SUBLINGUAL | Status: DC | PRN
  Administered 2022-02-09: 0.25 mg via SUBLINGUAL
  Filled 2022-02-09 (×3): qty 2

## 2022-02-09 SURGICAL SUPPLY — 22 items

## 2022-02-09 NOTE — Op Note (Signed)
[facility] ?Patient Name: Karen Kaufman ?Procedure Date : 02/09/2022 ?MRN: [id number] ?Attending MD: Ladene Artist , MD ?Date of Birth: Oct 03, 1961 ?CSN: [id number] ?Age: 61 ?Admit Type: Inpatient ?Procedure:                Colonoscopy ?Indications:              Hematochezia, Abnormal CT of the GI tract  ?                          (recto-sigmoid colon mass) ?Providers:                Pricilla Riffle. Fuller Plan, MD, Jaci Carrel, RN, Elberta Fortis  ?                          Johnella Moloney, Technician ?Referring MD:             TRH ?Medicines:                Monitored Anesthesia Care ?Complications:            No immediate complications. Estimated blood loss:  ?                          None. ?Estimated Blood Loss:     Estimated blood loss: none. ?Procedure:                Pre-Anesthesia Assessment: ?                          - Prior to the procedure, a History and Physical  ?                          was performed, and patient medications and  ?                          allergies were reviewed. The patient's tolerance of  ?                          previous anesthesia was also reviewed. The risks  ?                          and benefits of the procedure and the sedation  ?                          options and risks were discussed with the patient.  ?                          All questions were answered, and informed consent  ?                          was obtained. Prior Anticoagulants: The patient has  ?                          taken no previous anticoagulant or antiplatelet  ?                          agents. ASA Grade Assessment:  II - A patient with  ?                          mild systemic disease. After reviewing the risks  ?                          and benefits, the patient was deemed in  ?                          satisfactory condition to undergo the procedure. ?                          After obtaining informed consent, the colonoscope  ?                          was passed under direct vision. Throughout the  ?                           procedure, the patient's blood pressure, pulse, and  ?                          oxygen saturations were monitored continuously. The  ?                          PCF-HQ190TL ([id number] Olympus peds colonoscope was  ?                          introduced through the anus and advanced to the the  ?                          cecum, identified by appendiceal orifice and  ?                          ileocecal valve. The ileocecal valve, appendiceal  ?                          orifice, and rectum were photographed. The quality  ?                          of the bowel preparation was adequate after  ?                          extensive lavage and suction. The colonoscopy was  ?                          somewhat difficult due to obstructing mass,  ?                          significant looping and a tortuous colon. The  ?                          patient tolerated the procedure well. ?Scope In: 1:35:38 PM ?Scope Out: 2:05:29 PM ?Scope Withdrawal Time: 0 hours 19 minutes 59 seconds  ?Total Procedure Duration: 0 hours 29  minutes 51 seconds  ?Findings: ?     The perianal and digital rectal examinations were normal. ?     A 6 mm polyp was found in the transverse colon. The polyp was sessile.  ?     The polyp was removed with a cold snare. Resection and retrieval were  ?     complete. ?     A fungating almost completely obstructing medium-sized mass was found in  ?     the recto-sigmoid colon, measuring from 12-17 cm. The mass was  ?     circumferential (involving 100% of the lumen circumference). The mass  ?     measured 5 cm in length. In addition, its diameter measured 4 cm. No  ?     bleeding was present. This was biopsied with a cold forceps for  ?     histology. ?     The exam was otherwise without abnormality on direct and retroflexion  ?     views. ?Impression:               - One 6 mm polyp in the transverse colon, removed  ?                          with a cold snare. Resected and retrieved. ?                           - Malignant almost completely obstructing tumor in  ?                          the recto-sigmoid colon. Biopsied. ?                          - Otherwise normal appearing colon. ?Recommendation:           - Repeat colonoscopy in 1 year for surveillance  ?                          based on pathology results with Dr. Candis Schatz. ?                          - Patient has a contact number available for  ?                          emergencies. The signs and symptoms of potential  ?                          delayed complications were discussed with the  ?                          patient. Return to normal activities tomorrow.  ?                          Written discharge instructions were provided to the  ?                          patient. ?                          -  Clear liquid diet. ?                          - Continue present medications. ?                          - Surgical consult. ?                          - Await pathology results. ?Procedure Code(s):        --- Professional --- ?                          [phone], Colonoscopy, flexible; with removal of  ?                          tumor(s), polyp(s), or other lesion(s) by snare  ?                          technique ?                          45380, 59, Colonoscopy, flexible; with biopsy,  ?                          single or multiple ?Diagnosis Code(s):        --- Professional --- ?                          K63.5, Polyp of colon ?                          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                          K56.691, Other complete intestinal obstruction ?                          K92.1, Melena (includes Hematochezia) ?                          R93.3, Abnormal findings on diagnostic imaging of  ?                          other parts of digestive tract ?CPT copyright 2019 [employer]. All rights reserved. ?The codes documented in this report are preliminary and upon coder review may  ?be revised to meet  current compliance requirements. ?Ladene Artist, MD ?02/09/2022 2:36:00 PM ?This report has been signed electronically. ?Number of Addenda: 0 ?

## 2022-02-09 NOTE — Op Note (Signed)
[facility] ?Patient Name: Karen Kaufman ?Procedure Date : 02/09/2022 ?MRN: [id number] ?Attending MD: Ladene Artist , MD ?Date of Birth: 08/09/1961 ?CSN: [id number] ?Age: 61 ?Admit Type: Inpatient ?Procedure:                Flexible Sigmoidoscopy ?Indications:              Distention and abdominal pain in the left lower  ?                          quadrant post colonoscopy ?Providers:                Pricilla Riffle. Fuller Plan, MD, Jaci Carrel, RN, Elberta Fortis  ?                          Johnella Moloney, Technician ?Referring MD:             TRH ?Medicines:                None ?Complications:            No immediate complications. ?Estimated Blood Loss:     Estimated blood loss: none. ?Procedure:                Pre-Anesthesia Assessment: ?                          - Prior to the procedure, a History and Physical  ?                          was performed, and patient medications and  ?                          allergies were reviewed. The patient's tolerance of  ?                          previous anesthesia was also reviewed. The risks  ?                          and benefits of the procedure and the sedation  ?                          options and risks were discussed with the patient.  ?                          All questions were answered, and informed consent  ?                          was obtained. Prior Anticoagulants: The patient has  ?                          taken no previous anticoagulant or antiplatelet  ?                          agents. ASA Grade Assessment: II - A patient with  ?  mild systemic disease. After reviewing the risks  ?                          and benefits, the patient was deemed in  ?                          satisfactory condition to undergo the procedure. ?                          After obtaining informed consent, the scope was  ?                          passed under direct vision. The PCF-H190TL  ?                          ([id number]) Olympus slim colonoscope was  introduced  ?                          through the anus and advanced to the the sigmoid  ?                          colon. The flexible sigmoidoscopy was accomplished  ?                          without difficulty. The patient tolerated the  ?                          procedure well. The quality of the bowel  ?                          preparation was adequate. Retroflexion not  ?                          performed. ?Scope In: ?Scope Out: ?Findings: ?     The perianal and digital rectal examinations were normal. ?     A fungating almost completely obstructing mass was found in the  ?     recto-sigmoid colon. The mass was circumferential (involving 100% of the  ?     lumen circumference). The mass measured 5 cm in length. In addition, its  ?     diameter measured 4 cm. No bleeding was present. A moderate volume of  ?     liquid and small volume of CO2 proximal to the obstruction was  ?     suctioned. Decompression performed. Colon spasm was noted in the sigmoid  ?     colon proximal to the mass. ?     The exam was otherwise without abnormality. ?Impression:               - Malignant almost completely obstructing tumor in  ?                          the recto-sigmoid colon. ?                          - Exam otherwise normal. ?                          -  Dempression performed. ?                          - No specimens collected. ?Recommendation:           - Return patient to hospital ward for ongoing care. ?                          - Clear liquid diet. ?                          - Dicyclomine 20 mg po q6h prn. ?                          - Surgical consult. ?Procedure Code(s):        --- Professional --- ?                          [phone], Sigmoidoscopy, flexible; diagnostic,  ?                          including collection of specimen(s) by brushing or  ?                          washing, when performed (separate procedure) ?Diagnosis Code(s):        --- Professional --- ?                          C19, Malignant  neoplasm of rectosigmoid junction ?                          K56.691, Other complete intestinal obstruction ?                          R10.32, Left lower quadrant pain ?CPT copyright 2019 [employer]. All rights reserved. ?The codes documented in this report are preliminary and upon coder review may  ?be revised to meet current compliance requirements. ?Ladene Artist, MD ?02/09/2022 4:15:01 PM ?This report has been signed electronically. ?Number of Addenda: 0 ?

## 2022-02-09 NOTE — Progress Notes (Signed)
Referral entered from Dr Johney Maine via Suanne Marker message ?

## 2022-02-09 NOTE — Progress Notes (Signed)
?PROGRESS NOTE ? ?Karen Kaufman GHW:[id number] DOB: 12-04-1960 DOA: 02/08/2022 ?PCP: Gwenlyn Perking, FNP ? ? LOS: 0 days  ? ?Brief Narrative / Interim history: ?61 y.o. female with medical history significant of constipation, hyperlipidemia presenting with rectal bleeding.  Imaging on admission concerning for rectal mass/malignancy, with surrounding perirectal retroperitoneal lymph nodes enlargement as well as multiple ill-defined liver lesions.  GI consulted ? ?Subjective / 24h Interval events: ?Doing well today, awaiting colonoscopy.  Reports longstanding constipation but never seen blood.  Has never had a colonoscopy but has had several Cologuard test in the past years ? ?Assesement and Plan: ?Principal Problem: ?  Rectal mass ?Active Problems: ?  Hypercholesterolemia ?  Rectal bleed ?  Hematochezia ?  Abnormal CT scan, colon ?  Colonic mass ?  Benign neoplasm of transverse colon ? 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  This was biopsied.  GI recommended surgical consultation, they will call.  She is already being set up with oncology, has an appointment in 4 days.  Keep on clear liquid diet for now ? ?Active problems ?Hyperlipidemia-for now hold statin ? ?Scheduled Meds: ? [MAR Hold] sodium chloride flush  3 mL Intravenous Q12H  ? ?Continuous Infusions: ? sodium chloride 1,000 mL (02/09/22 7893)  ? sodium chloride Stopped (02/08/22 2002)  ? ?PRN Meds:.[MAR Hold] acetaminophen **OR** [MAR Hold] acetaminophen, hyoscyamine, [MAR Hold] ondansetron (ZOFRAN) IV ? ?Diet Orders (From admission, onward)  ? ?  Start     Ordered  ? 02/08/22 2002  Diet NPO time specified  Diet effective now       ? 02/08/22 2001  ? ?  ?  ? ?  ? ? ?DVT prophylaxis: SCDs Start: 02/08/22 1833 ? ? ?Lab Results   ?Component Value Date  ? PLT 218 02/09/2022  ? ? ?  Code Status: Full Code ? ?Family Communication: no family at bedside  ? ?Status is: Observation ? ?The patient will require care spanning > 2 midnights and should be moved to inpatient because: Obstructive colonic mass, surgical consultation pending, clear liquid diet ? ? ?Level of care: Telemetry Medical ? ?Consultants:  ?GI ?General surgery  ? ?Procedures:  ?Colonoscopy ? ?Microbiology  ?none ? ?Antimicrobials: ?none  ? ? ?Objective: ?Vitals:  ? 02/09/22 1257 02/09/22 1412 02/09/22 1414 02/09/22 1430  ?BP: 140/72 123/60 123/60 113/86  ?Pulse: 82 75 74 81  ?Resp: 20 14 (!) 23 (!) 21  ?Temp: 97.7 ?F (36.5 ?C) (!) 97.3 ?F (36.3 ?C)    ?TempSrc: Temporal Temporal    ?SpO2: 98% 100% 100% 99%  ?Weight:      ?Height:      ? ? ?Intake/Output Summary (Last 24 hours) at 02/09/2022 1441 ?Last data filed at 02/09/2022 0504 ?Gross per 24 hour  ?Intake 1033.17 ml  ?Output 4 ml  ?Net 1029.17 ml  ? ?Wt Readings from Last 3 Encounters:  ?02/09/22 67.9 kg  ?01/26/22 68.4 kg  ?01/07/22 69.6 kg  ? ? ?Examination: ? ?Constitutional: NAD ?Eyes: no scleral icterus ?ENMT: Mucous membranes are moist.  ?Neck: normal, supple ?Respiratory: clear to auscultation bilaterally, no wheezing, no crackles.  ?Cardiovascular: Regular rate and rhythm, no murmurs / rubs / gallops. No LE edema.  ?Abdomen: non distended, no tenderness. Bowel sounds positive.  ?Musculoskeletal: no clubbing / cyanosis.  ?Skin: no rashes ?Neurologic: non focal  ? ? ?  Data Reviewed: I have independently reviewed following labs and imaging studies  ? ?CBC ?Recent Labs  ?Lab 02/08/22 ?1500 02/09/22 ?0127  ?WBC 7.0 6.7  ?HGB 14.3 12.4  ?HCT 42.9 37.0  ?PLT 222 218  ?MCV 92.3 90.7  ?MCH 30.8 30.4  ?MCHC 33.3 33.5  ?RDW 11.9 11.7  ?LYMPHSABS 1.8  --   ?MONOABS 0.4  --   ?EOSABS 0.5  --   ?BASOSABS 0.1  --   ? ? ?Recent Labs  ?Lab 02/08/22 ?1500 02/09/22 ?0127  ?NA 142 141  ?K 3.5 3.5  ?CL 106 111  ?CO2 23 26  ?GLUCOSE 95 103*   ?BUN 7* <5*  ?CREATININE 0.80 0.69  ?CALCIUM 9.3 8.5*  ?AST 18 16  ?ALT 10 10  ?ALKPHOS 92 86  ?BILITOT 0.9 0.5  ?ALBUMIN 3.8 3.4*  ? ? ?------------------------------------------------------------------------------------------------------------------ ?No results for input(s): CHOL, HDL, LDLCALC, TRIG, CHOLHDL, LDLDIRECT in the last 72 hours. ? ?No results found for: HGBA1C ?------------------------------------------------------------------------------------------------------------------ ?No results for input(s): TSH, T4TOTAL, T3FREE, THYROIDAB in the last 72 hours. ? ?Invalid input(s): FREET3 ? ?Cardiac Enzymes ?No results for input(s): CKMB, TROPONINI, MYOGLOBIN in the last 168 hours. ? ?Invalid input(s): CK ?------------------------------------------------------------------------------------------------------------------ ?No results found for: BNP ? ?CBG: ?No results for input(s): GLUCAP in the last 168 hours. ? ?No results found for this or any previous visit (from the past 240 hour(s)).  ? ?Radiology Studies: ?CT ABDOMEN PELVIS WO CONTRAST ? ?Result Date: 02/08/2022 ?CLINICAL DATA:  Diverticulitis, complication suspected * Tracking Code: BO * EXAM: CT ABDOMEN AND PELVIS WITHOUT CONTRAST TECHNIQUE: Multidetector CT imaging of the abdomen and pelvis was performed following the standard protocol without IV contrast. RADIATION DOSE REDUCTION: This exam was performed according to the departmental dose-optimization program which includes automated exposure control, adjustment of the mA and/or kV according to patient size and/or use of iterative reconstruction technique. COMPARISON:  None. FINDINGS: Lower chest: No acute abnormality. Hepatobiliary: Multiple very ill-defined, hypodense liver lesions (series 5, image 26, 21). No gallstones, gallbladder wall thickening, or biliary dilatation. Pancreas: Unremarkable. No pancreatic ductal dilatation or surrounding inflammatory changes. Spleen: Normal in size without  significant abnormality. Adrenals/Urinary Tract: Adrenal glands are unremarkable. Kidneys are normal, without renal calculi, solid lesion, or hydronephrosis. Bladder is unremarkable. Stomach/Bowel: Stomach is within normal limits. Appendix appears normal. Severe circumferential wall thickening of the distal sigmoid colon and rectum (series 5, image 69, series 3, image 82). Occasional sigmoid diverticula, which do not appear to be specifically involved. Vascular/Lymphatic: No significant vascular findings are present. Enlarged perirectal lymph nodes measuring up to 2.1 x 2.0 cm (series 5, image 61). Enlarged low left retroperitoneal nodes measuring up to 1.3 x 1.2 cm. Reproductive: Status post hysterectomy. Other: No abdominal wall hernia or abnormality. Trace ascites in the low pelvis. Musculoskeletal: No acute or significant osseous findings. IMPRESSION: 1. Severe circumferential wall thickening of the distal sigmoid colon and rectum. This appearance is highly concerning for rectal mass. 2. Enlarged perirectal and low left retroperitoneal lymph nodes, although possibly reactive highly concerning for nodal metastatic disease. 3. Multiple ill-defined hypodense liver lesions, highly concerning for hepatic metastases. 4. Trace ascites in the low pelvis. Electronically Signed   By: Delanna Ahmadi M.D.   On: 02/08/2022 16:32   ? ? ?Marzetta Board, MD, PhD ?[facility] ? ?Between 7 am - 7 pm I am available, please contact me via Amion (for emergencies) or Securechat (non urgent messages) ? ?Between 7 pm - 7 am I am not available, please contact night  coverage MD/APP via Amion ? ?

## 2022-02-09 NOTE — H&P (View-Only) (Signed)
? ?                                                                          [facility] Consult: ?8:16 AM ?02/09/2022 ? LOS: 0 days  ? ? ?Referring Provider: Dr Cruzita Lederer  ?Primary Care Physician:  Gwenlyn Perking, FNP ?Primary Gastroenterologist:  Dr. Candis Schatz  ? ? ?Reason for Consultation:  rectal/sigmoid mass, limited BPR ?  ?HPI: Karen Kaufman is a 61 y.o. female.  Generally healthy.  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  Hyperlipidemia on Lipitor.  Reports a CT scan in 2011 showed what were not felt to be benign lesions in her liver.  Regular Cologuard's have all been negative, latest was in spring 2021.. ? ?Starting about a year ago patient started having fecal seepage of small volume liquid brown stool never saw any blood, did not have pain or obstructive sxs.  Towards the end of 2022 and into 2023 patient began to have constipation to the point where she needed to use laxatives to have a bowel movement and the fecal seepage ceased.  Again never saw any blood and her appetite was preserved with weight stable, no nausea or vomiting.  In last 2 weeks experiencing pain in the central supra pubic region which is not severe and feels crampy but is constant.  Sets of labs beginning in August 2022 revealed normal LFTs, unremarkable chemistries, unremarkable thyroid tests but recently determined to have B12 and vitamin D.  A plain abdominal film in June revealed moderate colonic stool burden, nonobstructive gas pattern ? Last year she had an x-ray which was unremarkable per patient report.  Referred to and seen by Dr. Candis Schatz on 05/20/2021.  He did not pursue colonoscopy given at that time resolved fecal seepage and lack of obstructive symptoms, the constipation had not become an issue until later in the year.  He recommended fiber supplements to prevent  recurrence of diagnosis overflow fecal incontinence. ? ?Yesterday, Sunday, for the first time she saw small amount of blood after a bowel movement.  This repeated itself within a few hours.  There was no large-volume hematochezia.  She saw blood for the third time once she got to the ER, again moderate volume at most.  No increased abdominal pain, no nausea or vomiting. ? ?CT scan shows severe thickening in the distal sigmoid and rectum concerning for rectal mass.  There are enlarged perirectal and retroperitoneal lymph nodes as well as multiple ill-defined liver lesions, concerning for mets.  Trace pelvic ascites. ?Labs unremarkable, no anemia or microcytosis.  LFTs are normal though albumin and protein low.  Calcium also low at 8.5. ? ?Patient completed bowel prep and stool this morning is clear. ? 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  Moved to [place] 2 years ago from [place].  Lives with her husband and adult son.  Also has a daughter who has 3 children.  She does not smoke or drink. ?Family history pertinent for breast cancer, AODM and dementia in her mother.  Prostate cancer in her father who died with some sort of gastric outlet obstruction/bowel obstruction in his 28s. ?  ? ?Past Medical History:  ?Diagnosis  Date  ? Allergy   ? Cancer [facility])   ? skin cancer removed by dermatology  ? Cataract   ? Hyperlipidemia   ? ? ?Past Surgical History:  ?Procedure Laterality Date  ? ABDOMINAL HYSTERECTOMY  2002  ? BRAIN SURGERY  1995  ? craniotomy-removal of tumor from behind right eye  ? BREAST SURGERY  2003  ? breast implants  ? EYE SURGERY    ? bilateral cataract removal  ? ? ?Prior to Admission medications   ?Medication Sig Start Date End Date Taking? Authorizing Provider  ?aspirin-acetaminophen-caffeine (EXCEDRIN MIGRAINE) 250-250-65 MG tablet Take 1 tablet by mouth every 6 (six) hours as needed for headache.   Yes [provider]  ?atorvastatin (LIPITOR) 20 MG tablet Take 1 tablet (20  mg total) by mouth daily. 01/26/22  Yes Gwenlyn Perking, FNP  ?bisacodyl (DULCOLAX) 5 MG EC tablet Take 10 mg by mouth daily as needed for moderate constipation.   Yes [provider]  ?MAGNESIUM PO Take 400 mg by mouth daily.   Yes [provider]  ?vitamin B-12 (CYANOCOBALAMIN) 1000 MCG tablet Take 1 tablet (1,000 mcg total) by mouth daily. 01/27/22  Yes Gwenlyn Perking, FNP  ?Vitamin D, Ergocalciferol, (DRISDOL) 1.25 MG (50000 UNIT) CAPS capsule Take 1 capsule (50,000 Units total) by mouth every 7 (seven) days. ?Patient not taking: Reported on 02/08/2022 01/27/22   Gwenlyn Perking, FNP  ? ? ?Scheduled Meds: ? sodium chloride flush  3 mL Intravenous Q12H  ? ?Infusions: ? sodium chloride 1,000 mL (02/09/22 4193)  ? sodium chloride Stopped (02/08/22 2002)  ? ?PRN Meds: ?acetaminophen **OR** acetaminophen, ondansetron (ZOFRAN) IV ? ? ?Allergies as of 02/08/2022 - Review Complete 02/08/2022  ?Allergen Reaction Noted  ? Iodine Anaphylaxis 11/05/2020  ? Shellfish allergy Anaphylaxis 11/05/2020  ? ? ?Family History  ?Problem Relation Age of Onset  ? Cancer Mother   ?     breast  ? Diabetes Mother   ? Hyperlipidemia Mother   ? Hypertension Mother   ? Cancer Father   ?     prostate  ? Arthritis Maternal Grandmother   ? Arthritis Maternal Grandfather   ? Cancer Maternal Grandfather   ?     hodgkins lymphoma  ? Arthritis Paternal Grandmother   ? Arthritis Paternal Grandfather   ? Diabetes Paternal Grandfather   ? Colon cancer Neg Hx   ? Pancreatic cancer Neg Hx   ? Esophageal cancer Neg Hx   ? ? ?Social History  ? ?Socioeconomic History  ? Marital status: Married  ?  Spouse name: Not on file  ? Number of children: 2  ? Years of education: 54  ? Highest education level: Some college, no degree  ?Occupational History  ? Occupation: retired  ?Tobacco Use  ? Smoking status: Never  ? Smokeless tobacco: Never  ?Vaping Use  ? Vaping Use: Never used  ?Substance and Sexual Activity  ? Alcohol use: Not Currently  ?  Drug use: Not Currently  ? Sexual activity: Yes  ?  Birth control/protection: Surgical  ?Other Topics Concern  ? Not on file  ?Social History Narrative  ? Not on file  ? ?Social Determinants of Health  ? ?Financial Resource Strain: Not on file  ?Food Insecurity: Not on file  ?Transportation Needs: Not on file  ?Physical Activity: Not on file  ?Stress: Not on file  ?Social Connections: Not on file  ?Intimate Partner Violence: Not on file  ? ? ?REVIEW OF SYSTEMS: ?Constitutional: No  weakness, no fatigue. ?ENT:  No nose bleeds ?Pulm: No respiratory issues such as cough or dyspnea. ?CV:  No palpitations, no LE edema.  No angina or chest pressure. ?GU:  No hematuria, no frequency ?GI: See HPI. ?Heme: No unusual bleeding or bruising tendencies. ?Transfusions: No transfusion of blood products ever. ?Neuro: Previous history of migraines, not recently an issue. ?Derm:  No itching, no rash or sores.  ?Endocrine:  No sweats or chills.  No polyuria or dysuria ?Immunization: Vaccine history reviewed. ?Travel:  None beyond local counties in last few months.  ? ? ?PHYSICAL EXAM: ?Vital signs in last 24 hours: ?Vitals:  ? 02/09/22 0428 02/09/22 0727  ?BP: 125/74 (!) 147/75  ?Pulse: 65 72  ?Resp: 20 16  ?Temp: 97.6 ?F (36.4 ?C) 98.1 ?F (36.7 ?C)  ?SpO2: 99% 100%  ? ?Wt Readings from Last 3 Encounters:  ?02/09/22 67.9 kg  ?01/26/22 68.4 kg  ?01/07/22 69.6 kg  ? ? ?General: Pleasant, well-appearing, alert, comfortable. ?Head: No facial swelling.  Subtle asymmetry from remote craniotomy in region of right lateral face/eye. ?Eyes: No scleral icterus or conjunctival pallor.  EOMI. ?Ears: Not hard of hearing ?Nose: No congestion or discharge ?Mouth: Excellent dentition.  Mucosa is moist, pink, clear.  Tongue is midline. ?Neck: No JVD, masses, thyromegaly. ?Lungs: Clear bilaterally without labored breathing.  No cough ?Heart: RRR.  No MRG.  S1, S2 present ?Abdomen: Soft.  Mild to at most moderate tenderness without guarding or rebound in  the central pelvis/lower abdomen.  Do not appreciate palpable mass but has somewhat more pronounced tenderness but again without guarding or rebound in the left lower quadrant.  No HSM, masses, bruits, he

## 2022-02-09 NOTE — Anesthesia Postprocedure Evaluation (Signed)
Anesthesia Post Note ? ?Patient: Karen Kaufman ? ?Procedure(s) Performed: COLONOSCOPY WITH PROPOFOL ?POLYPECTOMY ? ?  ? ?Patient location during evaluation: PACU ?Anesthesia Type: MAC ?Level of consciousness: awake and alert ?Pain management: pain level controlled ?Vital Signs Assessment: post-procedure vital signs reviewed and stable ?Respiratory status: spontaneous breathing and respiratory function stable ?Cardiovascular status: stable ?Postop Assessment: no apparent nausea or vomiting ?Anesthetic complications: no ? ? ?No notable events documented. ? ?Last Vitals:  ?Vitals:  ? 02/09/22 1412 02/09/22 1414  ?BP: 123/60 123/60  ?Pulse: 75 74  ?Resp: 14 (!) 23  ?Temp: (!) 36.3 ?C   ?SpO2: 100% 100%  ?  ?Last Pain:  ?Vitals:  ? 02/09/22 1412  ?TempSrc: Temporal  ?PainSc: 0-No pain  ? ? ?  ?  ?  ?  ?  ?  ? ?Merlinda Frederick ? ? ? ? ?

## 2022-02-09 NOTE — Transfer of Care (Signed)
Immediate Anesthesia Transfer of Care Note ? ?Patient: Karen Kaufman ? ?Procedure(s) Performed: COLONOSCOPY WITH PROPOFOL ?POLYPECTOMY ? ?Patient Location: Endoscopy Unit ? ?Anesthesia Type:MAC ? ?Level of Consciousness: awake, drowsy and patient cooperative ? ?Airway & Oxygen Therapy: Patient Spontanous Breathing ? ?Post-op Assessment: Report given to RN and Post -op Vital signs reviewed and stable ? ?Post vital signs: Reviewed and stable ? ?Last Vitals:  ?Vitals Value Taken Time  ?BP 123/60 02/09/22 1414  ?Temp 36.3 ?C 02/09/22 1412  ?Pulse 73 02/09/22 1414  ?Resp 25 02/09/22 1414  ?SpO2 100 % 02/09/22 1414  ?Vitals shown include unvalidated device data. ? ?Last Pain:  ?Vitals:  ? 02/09/22 1412  ?TempSrc: Temporal  ?PainSc: 0-No pain  ?   ? ?  ? ?Complications: No notable events documented. ?

## 2022-02-09 NOTE — Consult Note (Signed)
? ? ? ?Karen Kaufman ?[birth year]  ?[id number].   ? ?Requesting MD: Dr. Terance Ice ?Chief Complaint/Reason for Consult: Recto-sigmoid mass ? ?HPI: Karen Kaufman is a 61 y.o. female w/ a hx of HLD who presented to the ED on 4/23 for rectal bleeding. Patient reports towards the end of 2022 into 2023 she began having constipation to the point where she needed to use laxatives/stool softeners to have a bowel movement. Over the last 2 weeks she began having suprapubic abdominal pain and worsening constipation. On 4/23 she had several episodes blood bm's. CT showed severe circumferential wall thickening of the distal sigmoid colon and rectum concerning for rectal mass; enlarged perirectal and low left retroperitoneal lymph nodes concerning for nodal metastatic disease; and multiple ill-defined hypodense liver lesions concerning for hepatic metastases. Hgb has been stable and without signs of anemia. [facility] admitted. GI consulted. Colonoscopy 4/24 showed a circumferential fungating almost completely obstructing medium-sized mass in the recto-sigmoid colon; no bleeding present. This also showed a 6 mm polyp in the transverse colon that was removed with a cold snare. We were asked to see.  ? ?Prior to colonoscopy today patient has had prior Cologuard's with the last in spring 2021 that have all been negative. No prior colonoscopy on file. Hx of abdominal Hysterectomy. She is not on any blood thinners. She states she is currently tolerating PO, has not had nausea/vomiting and is still having some flatus and BMs w/ use of laxative medications.  ? ?ROS: ?Review of Systems  ?Constitutional:  Negative for chills and fever.  ?Gastrointestinal:  Positive for abdominal pain, blood in stool and constipation. Negative for nausea and vomiting.  ? ?Family History  ?Problem Relation Age of Onset  ? Cancer Mother   ?     breast  ? Diabetes Mother   ? Hyperlipidemia Mother   ? Hypertension Mother   ? Cancer Father   ?     prostate  ?  Arthritis Maternal Grandmother   ? Arthritis Maternal Grandfather   ? Cancer Maternal Grandfather   ?     hodgkins lymphoma  ? Arthritis Paternal Grandmother   ? Arthritis Paternal Grandfather   ? Diabetes Paternal Grandfather   ? Colon cancer Neg Hx   ? Pancreatic cancer Neg Hx   ? Esophageal cancer Neg Hx   ? ? ?Past Medical History:  ?Diagnosis Date  ? Allergy   ? Cancer [facility])   ? skin cancer removed by dermatology  ? Cataract   ? Hyperlipidemia   ? ? ?Past Surgical History:  ?Procedure Laterality Date  ? ABDOMINAL HYSTERECTOMY  2002  ? BRAIN SURGERY  1995  ? craniotomy-removal of tumor from behind right eye  ? BREAST SURGERY  2003  ? breast implants  ? EYE SURGERY    ? bilateral cataract removal  ? ? ?Social History:  reports that she has never smoked. She has never used smokeless tobacco. She reports that she does not currently use alcohol. She reports that she does not currently use drugs. ? ?Allergies:  ?Allergies  ?Allergen Reactions  ? Iodine Anaphylaxis  ? Shellfish Allergy Anaphylaxis  ? ? ?Medications Prior to Admission  ?Medication Sig Dispense Refill  ? aspirin-acetaminophen-caffeine (EXCEDRIN MIGRAINE) 250-250-65 MG tablet Take 1 tablet by mouth every 6 (six) hours as needed for headache.    ? atorvastatin (LIPITOR) 20 MG tablet Take 1 tablet (20 mg total) by mouth daily. 90 tablet 3  ? bisacodyl (DULCOLAX) 5 MG EC tablet Take 10 mg  by mouth daily as needed for moderate constipation.    ? MAGNESIUM PO Take 400 mg by mouth daily.    ? vitamin B-12 (CYANOCOBALAMIN) 1000 MCG tablet Take 1 tablet (1,000 mcg total) by mouth daily. 90 tablet 1  ? Vitamin D, Ergocalciferol, (DRISDOL) 1.25 MG (50000 UNIT) CAPS capsule Take 1 capsule (50,000 Units total) by mouth every 7 (seven) days. (Patient not taking: Reported on 02/08/2022) 12 capsule 0  ? ? ? ?Physical Exam: ?Blood pressure (!) 137/55, pulse 65, temperature (!) 97.3 ?F (36.3 ?C), temperature source Temporal, resp. rate 13, height '5\' 6"'$  (1.676 m), weight  67.9 kg, SpO2 100 %. ?General: pleasant, WD/WN female who is laying in bed in NAD ?HEENT: head is normocephalic, atraumatic.  Anicteric sclerae ?Heart: regular, rate, and rhythm.  Normal s1,s2. No obvious murmurs, gallops, or rubs noted.   ?Lungs: Respiratory effort nonlabored ?Abd:  Soft, mild distention, overall non-tender, no masses, hernias, or organomegaly ?MS: no BUE/BLE edema, calves soft and nontender ?Skin: warm and dry with no masses, lesions, or rashes ?Psych: A&Ox4 with an appropriate affect ?Neuro: cranial nerves grossly intact, normal speech, thought process intact, moves all extremities, gait not assessed ? ?Results for orders placed or performed during the hospital encounter of 02/08/22 (from the past 48 hour(s))  ?POC occult blood, ED Provider will collect     Status: Abnormal  ? Collection Time: 02/08/22  2:44 PM  ?Result Value Ref Range  ? Fecal Occult Bld POSITIVE (A) NEGATIVE  ?Comprehensive metabolic panel     Status: Abnormal  ? Collection Time: 02/08/22  3:00 PM  ?Result Value Ref Range  ? Sodium 142 135 - 145 mmol/L  ? Potassium 3.5 3.5 - 5.1 mmol/L  ? Chloride 106 98 - 111 mmol/L  ? CO2 23 22 - 32 mmol/L  ? Glucose, Bld 95 70 - 99 mg/dL  ?  Comment: Glucose reference range applies only to samples taken after fasting for at least 8 hours.  ? BUN 7 (L) 8 - 23 mg/dL  ? Creatinine, Ser 0.80 0.44 - 1.00 mg/dL  ? Calcium 9.3 8.9 - 10.3 mg/dL  ? Total Protein 6.4 (L) 6.5 - 8.1 g/dL  ? Albumin 3.8 3.5 - 5.0 g/dL  ? AST 18 15 - 41 U/L  ? ALT 10 0 - 44 U/L  ? Alkaline Phosphatase 92 38 - 126 U/L  ? Total Bilirubin 0.9 0.3 - 1.2 mg/dL  ? GFR, Estimated >60 >60 mL/min  ?  Comment: (NOTE) ?Calculated using the CKD-EPI Creatinine Equation (2021) ?  ? Anion gap 13 5 - 15  ?  Comment: Performed at [facility], [place] [address] [place], [place] [address]  ?Lipase, blood     Status: None  ? Collection Time: 02/08/22  3:00 PM  ?Result Value Ref Range  ? Lipase 32 11 - 51 U/L  ?  Comment: Performed at  [facility], [place] [address], [place],  [address]  ?CBC with Diff     Status: None  ? Collection Time: 02/08/22  3:00 PM  ?Result Value Ref Range  ? WBC 7.0 4.0 - 10.5 K/uL  ? RBC 4.65 3.87 - 5.11 MIL/uL  ? Hemoglobin 14.3 12.0 - 15.0 g/dL  ? HCT 42.9 36.0 - 46.0 %  ? MCV 92.3 80.0 - 100.0 fL  ? MCH 30.8 26.0 - 34.0 pg  ? MCHC 33.3 30.0 - 36.0 g/dL  ? RDW 11.9 11.5 - 15.5 %  ? Platelets  222 150 - 400 K/uL  ? nRBC 0.0 0.0 - 0.2 %  ? Neutrophils Relative % 60 %  ? Neutro Abs 4.2 1.7 - 7.7 K/uL  ? Lymphocytes Relative 25 %  ? Lymphs Abs 1.8 0.7 - 4.0 K/uL  ? Monocytes Relative 6 %  ? Monocytes Absolute 0.4 0.1 - 1.0 K/uL  ? Eosinophils Relative 8 %  ? Eosinophils Absolute 0.5 0.0 - 0.5 K/uL  ? Basophils Relative 1 %  ? Basophils Absolute 0.1 0.0 - 0.1 K/uL  ? Immature Granulocytes 0 %  ? Abs Immature Granulocytes 0.02 0.00 - 0.07 K/uL  ?  Comment: Performed at [facility], [place] [address], [place], [place] [address]  ?Urinalysis, Routine w reflex microscopic Urine, Clean Catch     Status: Abnormal  ? Collection Time: 02/08/22  6:08 PM  ?Result Value Ref Range  ? Color, Urine STRAW (A) YELLOW  ? APPearance CLEAR CLEAR  ? Specific Gravity, Urine 1.005 1.005 - 1.030  ? pH 7.0 5.0 - 8.0  ? Glucose, UA NEGATIVE NEGATIVE mg/dL  ? Hgb urine dipstick NEGATIVE NEGATIVE  ? Bilirubin Urine NEGATIVE NEGATIVE  ? Ketones, ur NEGATIVE NEGATIVE mg/dL  ? Protein, ur NEGATIVE NEGATIVE mg/dL  ? Nitrite NEGATIVE NEGATIVE  ? Leukocytes,Ua NEGATIVE NEGATIVE  ?  Comment: Performed at [facility], [place] [address] [place], [place] [address]  ?Comprehensive metabolic panel     Status: Abnormal  ? Collection Time: 02/09/22  1:27 AM  ?Result Value Ref Range  ? Sodium 141 135 - 145 mmol/L  ? Potassium 3.5 3.5 - 5.1 mmol/L  ? Chloride 111 98 - 111 mmol/L  ? CO2 26 22 - 32 mmol/L  ? Glucose, Bld 103 (H) 70 - 99 mg/dL  ?  Comment: Glucose reference range applies only to samples taken after fasting for at least 8 hours.  ?  BUN <5 (L) 8 - 23 mg/dL  ? Creatinine, Ser 0.69 0.44 - 1.00 mg/dL  ? Calcium 8.5 (L) 8.9 - 10.3 mg/dL  ? Total Protein 5.8 (L) 6.5 - 8.1 g/dL  ? Albumin 3.4 (L) 3.5 - 5.0 g/dL  ? AST 16 15 - 41 U/L  ? ALT 10 0 - 4

## 2022-02-09 NOTE — Anesthesia Preprocedure Evaluation (Addendum)
Anesthesia Evaluation  ?Patient identified by MRN, date of birth, ID band ?Patient awake ? ? ? ?Reviewed: ?Allergy & Precautions, NPO status , Patient's Chart, lab work & pertinent test results ? ?Airway ?Mallampati: II ? ?TM Distance: >3 FB ?Neck ROM: Full ? ? ? Dental ?no notable dental hx. ? ?  ?Pulmonary ?neg pulmonary ROS,  ?  ?Pulmonary exam normal ?breath sounds clear to auscultation ? ? ? ? ? ? Cardiovascular ?Exercise Tolerance: Good ?negative cardio ROS ?Normal cardiovascular exam ?Rhythm:Regular Rate:Normal ? ? ?  ?Neuro/Psych ?negative neurological ROS ? negative psych ROS  ? GI/Hepatic ?Neg liver ROS, Rectal bleed ?Rectal mass ?  ?Endo/Other  ?negative endocrine ROS ? Renal/GU ?negative Renal ROS  ?negative genitourinary ?  ?Musculoskeletal ?negative musculoskeletal ROS ?(+)  ? Abdominal ?  ?Peds ?negative pediatric ROS ?(+)  Hematology ?negative hematology ROS ?(+)   ?Anesthesia Other Findings ? ? Reproductive/Obstetrics ?negative OB ROS ? ?  ? ? ? ? ? ? ? ? ? ? ? ? ? ?  ?  ? ? ? ? ? ? ? ?Anesthesia Physical ?Anesthesia Plan ? ?ASA: 3 ? ?Anesthesia Plan: MAC  ? ?Post-op Pain Management: Minimal or no pain anticipated  ? ?Induction: Intravenous ? ?PONV Risk Score and Plan: 2 and Treatment may vary due to age or medical condition, Ondansetron and Propofol infusion ? ?Airway Management Planned: Natural Airway ? ?Additional Equipment:  ? ?Intra-op Plan:  ? ?Post-operative Plan:  ? ?Informed Consent: I have reviewed the patients History and Physical, chart, labs and discussed the procedure including the risks, benefits and alternatives for the proposed anesthesia with the patient or authorized representative who has indicated his/her understanding and acceptance.  ? ? ? ?Dental advisory given ? ?Plan Discussed with: CRNA, Anesthesiologist and Surgeon ? ?Anesthesia Plan Comments: (No nausea/vomiting today. OK for MAC. Propofol gtt. Norton Blizzard, MD  ?)  ? ? ? ? ? ?Anesthesia  Quick Evaluation ? ?

## 2022-02-09 NOTE — Progress Notes (Signed)
PATIENT NAVIGATOR PROGRESS NOTE ? ?Name: Karen Kaufman ?Date: 02/09/2022 ?MRN: [id number]  ?DOB: [birth year] ? ? ?Reason for visit:  ?Introductory phone call ? ?Comments:  Spoke with pt and her husband regarding new patient appt this Friday at 2:45. She is having colonoscopy today.  ?Directions and parking, one support person and mask policy reviewed.  ? ?Given contact information to call with any questions or issues ? ? ? ?Time spent counseling/coordinating care: 30-45 minutes ? ?

## 2022-02-09 NOTE — Progress Notes (Signed)
Patient ID: Karen Kaufman, female   DOB: 1961/09/27, 61 y.o.   MRN: [id number] ? ? ?Will discuss case with my colorectal surgeon partners and do a formal consult tomorrow regarding best options for her. ?

## 2022-02-09 NOTE — Progress Notes (Signed)
Mobility Specialist Progress Note: ? ? 02/09/22 1309  ?Mobility  ?Activity Off unit  ? ?Will follow-up as time allows. ? ?Kaliegh Willadsen ?Mobility Specialist ?Primary Phone [phone] ? ?

## 2022-02-09 NOTE — Interval H&P Note (Signed)
History and Physical Interval Note: ? ?02/09/2022 ?1:07 PM ? ?Karen Kaufman  has presented today for surgery, with the diagnosis of rectal bleeding, abnormal colon on CT.  The various methods of treatment have been discussed with the patient and family. After consideration of risks, benefits and other options for treatment, the patient has consented to  Procedure(s): ?COLONOSCOPY WITH PROPOFOL (N/A) as a surgical intervention.  The patient's history has been reviewed, patient examined, no change in status, stable for surgery.  I have reviewed the patient's chart and labs.  Questions were answered to the patient's satisfaction.   ? ? ?Pricilla Riffle. Fuller Plan ? ? ?

## 2022-02-09 NOTE — Consult Note (Addendum)
? ?                                                                          [facility] Consult: ?8:16 AM ?02/09/2022 ? LOS: 0 days  ? ? ?Referring Provider: Dr Cruzita Lederer  ?Primary Care Physician:  Gwenlyn Perking, FNP ?Primary Gastroenterologist:  Dr. Candis Schatz  ? ? ?Reason for Consultation:  rectal/sigmoid mass, limited BPR ?  ?HPI: Karen Kaufman is a 61 y.o. female.  Generally healthy.  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  Hyperlipidemia on Lipitor.  Reports a CT scan in 2011 showed what were not felt to be benign lesions in her liver.  Regular Cologuard's have all been negative, latest was in spring 2021.. ? ?Starting about a year ago patient started having fecal seepage of small volume liquid brown stool never saw any blood, did not have pain or obstructive sxs.  Towards the end of 2022 and into 2023 patient began to have constipation to the point where she needed to use laxatives to have a bowel movement and the fecal seepage ceased.  Again never saw any blood and her appetite was preserved with weight stable, no nausea or vomiting.  In last 2 weeks experiencing pain in the central supra pubic region which is not severe and feels crampy but is constant.  Sets of labs beginning in August 2022 revealed normal LFTs, unremarkable chemistries, unremarkable thyroid tests but recently determined to have B12 and vitamin D.  A plain abdominal film in June revealed moderate colonic stool burden, nonobstructive gas pattern ? Last year she had an x-ray which was unremarkable per patient report.  Referred to and seen by Dr. Candis Schatz on 05/20/2021.  He did not pursue colonoscopy given at that time resolved fecal seepage and lack of obstructive symptoms, the constipation had not become an issue until later in the year.  He recommended fiber supplements to prevent  recurrence of diagnosis overflow fecal incontinence. ? ?Yesterday, Sunday, for the first time she saw small amount of blood after a bowel movement.  This repeated itself within a few hours.  There was no large-volume hematochezia.  She saw blood for the third time once she got to the ER, again moderate volume at most.  No increased abdominal pain, no nausea or vomiting. ? ?CT scan shows severe thickening in the distal sigmoid and rectum concerning for rectal mass.  There are enlarged perirectal and retroperitoneal lymph nodes as well as multiple ill-defined liver lesions, concerning for mets.  Trace pelvic ascites. ?Labs unremarkable, no anemia or microcytosis.  LFTs are normal though albumin and protein low.  Calcium also low at 8.5. ? ?Patient completed bowel prep and stool this morning is clear. ? 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  Moved to [place] 2 years ago from [place].  Lives with her husband and adult son.  Also has a daughter who has 3 children.  She does not smoke or drink. ?Family history pertinent for breast cancer, AODM and dementia in her mother.  Prostate cancer in her father who died with some sort of gastric outlet obstruction/bowel obstruction in his 42s. ?  ? ?Past Medical History:  ?Diagnosis  Date  ? Allergy   ? Cancer [facility])   ? skin cancer removed by dermatology  ? Cataract   ? Hyperlipidemia   ? ? ?Past Surgical History:  ?Procedure Laterality Date  ? ABDOMINAL HYSTERECTOMY  2002  ? BRAIN SURGERY  1995  ? craniotomy-removal of tumor from behind right eye  ? BREAST SURGERY  2003  ? breast implants  ? EYE SURGERY    ? bilateral cataract removal  ? ? ?Prior to Admission medications   ?Medication Sig Start Date End Date Taking? Authorizing Provider  ?aspirin-acetaminophen-caffeine (EXCEDRIN MIGRAINE) 250-250-65 MG tablet Take 1 tablet by mouth every 6 (six) hours as needed for headache.   Yes [provider]  ?atorvastatin (LIPITOR) 20 MG tablet Take 1 tablet (20  mg total) by mouth daily. 01/26/22  Yes Gwenlyn Perking, FNP  ?bisacodyl (DULCOLAX) 5 MG EC tablet Take 10 mg by mouth daily as needed for moderate constipation.   Yes [provider]  ?MAGNESIUM PO Take 400 mg by mouth daily.   Yes [provider]  ?vitamin B-12 (CYANOCOBALAMIN) 1000 MCG tablet Take 1 tablet (1,000 mcg total) by mouth daily. 01/27/22  Yes Gwenlyn Perking, FNP  ?Vitamin D, Ergocalciferol, (DRISDOL) 1.25 MG (50000 UNIT) CAPS capsule Take 1 capsule (50,000 Units total) by mouth every 7 (seven) days. ?Patient not taking: Reported on 02/08/2022 01/27/22   Gwenlyn Perking, FNP  ? ? ?Scheduled Meds: ? sodium chloride flush  3 mL Intravenous Q12H  ? ?Infusions: ? sodium chloride 1,000 mL (02/09/22 7829)  ? sodium chloride Stopped (02/08/22 2002)  ? ?PRN Meds: ?acetaminophen **OR** acetaminophen, ondansetron (ZOFRAN) IV ? ? ?Allergies as of 02/08/2022 - Review Complete 02/08/2022  ?Allergen Reaction Noted  ? Iodine Anaphylaxis 11/05/2020  ? Shellfish allergy Anaphylaxis 11/05/2020  ? ? ?Family History  ?Problem Relation Age of Onset  ? Cancer Mother   ?     breast  ? Diabetes Mother   ? Hyperlipidemia Mother   ? Hypertension Mother   ? Cancer Father   ?     prostate  ? Arthritis Maternal Grandmother   ? Arthritis Maternal Grandfather   ? Cancer Maternal Grandfather   ?     hodgkins lymphoma  ? Arthritis Paternal Grandmother   ? Arthritis Paternal Grandfather   ? Diabetes Paternal Grandfather   ? Colon cancer Neg Hx   ? Pancreatic cancer Neg Hx   ? Esophageal cancer Neg Hx   ? ? ?Social History  ? ?Socioeconomic History  ? Marital status: Married  ?  Spouse name: Not on file  ? Number of children: 2  ? Years of education: 68  ? Highest education level: Some college, no degree  ?Occupational History  ? Occupation: retired  ?Tobacco Use  ? Smoking status: Never  ? Smokeless tobacco: Never  ?Vaping Use  ? Vaping Use: Never used  ?Substance and Sexual Activity  ? Alcohol use: Not Currently  ?  Drug use: Not Currently  ? Sexual activity: Yes  ?  Birth control/protection: Surgical  ?Other Topics Concern  ? Not on file  ?Social History Narrative  ? Not on file  ? ?Social Determinants of Health  ? ?Financial Resource Strain: Not on file  ?Food Insecurity: Not on file  ?Transportation Needs: Not on file  ?Physical Activity: Not on file  ?Stress: Not on file  ?Social Connections: Not on file  ?Intimate Partner Violence: Not on file  ? ? ?REVIEW OF SYSTEMS: ?Constitutional: No  weakness, no fatigue. ?ENT:  No nose bleeds ?Pulm: No respiratory issues such as cough or dyspnea. ?CV:  No palpitations, no LE edema.  No angina or chest pressure. ?GU:  No hematuria, no frequency ?GI: See HPI. ?Heme: No unusual bleeding or bruising tendencies. ?Transfusions: No transfusion of blood products ever. ?Neuro: Previous history of migraines, not recently an issue. ?Derm:  No itching, no rash or sores.  ?Endocrine:  No sweats or chills.  No polyuria or dysuria ?Immunization: Vaccine history reviewed. ?Travel:  None beyond local counties in last few months.  ? ? ?PHYSICAL EXAM: ?Vital signs in last 24 hours: ?Vitals:  ? 02/09/22 0428 02/09/22 0727  ?BP: 125/74 (!) 147/75  ?Pulse: 65 72  ?Resp: 20 16  ?Temp: 97.6 ?F (36.4 ?C) 98.1 ?F (36.7 ?C)  ?SpO2: 99% 100%  ? ?Wt Readings from Last 3 Encounters:  ?02/09/22 67.9 kg  ?01/26/22 68.4 kg  ?01/07/22 69.6 kg  ? ? ?General: Pleasant, well-appearing, alert, comfortable. ?Head: No facial swelling.  Subtle asymmetry from remote craniotomy in region of right lateral face/eye. ?Eyes: No scleral icterus or conjunctival pallor.  EOMI. ?Ears: Not hard of hearing ?Nose: No congestion or discharge ?Mouth: Excellent dentition.  Mucosa is moist, pink, clear.  Tongue is midline. ?Neck: No JVD, masses, thyromegaly. ?Lungs: Clear bilaterally without labored breathing.  No cough ?Heart: RRR.  No MRG.  S1, S2 present ?Abdomen: Soft.  Mild to at most moderate tenderness without guarding or rebound in  the central pelvis/lower abdomen.  Do not appreciate palpable mass but has somewhat more pronounced tenderness but again without guarding or rebound in the left lower quadrant.  No HSM, masses, bruits, he

## 2022-02-09 NOTE — Anesthesia Procedure Notes (Signed)
Procedure Name: [place] ?Date/Time: 02/09/2022 1:30 PM ?Performed by: Dorthea Cove, CRNA ?Pre-anesthesia Checklist: Patient identified, Emergency Drugs available, Suction available and Patient being monitored ?Patient Re-evaluated:Patient Re-evaluated prior to induction ?Oxygen Delivery Method: Simple face mask ?Induction Type: IV induction ?Placement Confirmation: positive ETCO2 ?Dental Injury: Teeth and Oropharynx as per pre-operative assessment  ? ? ? ? ?

## 2022-02-10 ENCOUNTER — Ambulatory Visit: Payer: BC Managed Care – PPO | Admitting: Gastroenterology

## 2022-02-10 DIAGNOSIS — R933 Abnormal findings on diagnostic imaging of other parts of digestive tract: Secondary | ICD-10-CM | POA: Diagnosis not present

## 2022-02-10 DIAGNOSIS — K56609 Unspecified intestinal obstruction, unspecified as to partial versus complete obstruction: Secondary | ICD-10-CM

## 2022-02-10 DIAGNOSIS — K6389 Other specified diseases of intestine: Secondary | ICD-10-CM

## 2022-02-10 DIAGNOSIS — K6289 Other specified diseases of anus and rectum: Secondary | ICD-10-CM | POA: Diagnosis not present

## 2022-02-10 DIAGNOSIS — D123 Benign neoplasm of transverse colon: Secondary | ICD-10-CM | POA: Diagnosis not present

## 2022-02-10 LAB — CEA: CEA: 3.2 ng/mL (ref 0.0–4.7)

## 2022-02-10 LAB — HIV ANTIBODY (ROUTINE TESTING W REFLEX): HIV Screen 4th Generation wRfx: NONREACTIVE

## 2022-02-10 MED ORDER — DIPHENHYDRAMINE HCL 25 MG PO CAPS: 50.0000 mg | ORAL_CAPSULE | Freq: Once | ORAL | Status: DC

## 2022-02-10 MED ORDER — ENSURE ENLIVE PO LIQD
237.0000 mL | Freq: Three times a day (TID) | ORAL | Status: DC
  Administered 2022-02-10: 237 mL via ORAL

## 2022-02-10 MED ORDER — IOHEXOL 9 MG/ML PO SOLN
ORAL | Status: AC
  Filled 2022-02-10: qty 1000

## 2022-02-10 MED ORDER — POLYETHYLENE GLYCOL 3350 17 G PO PACK: 17.0000 g | PACK | Freq: Two times a day (BID) | ORAL | 0 refills | Status: DC

## 2022-02-10 MED ORDER — PREDNISONE 50 MG PO TABS: 50.0000 mg | ORAL_TABLET | Freq: Four times a day (QID) | ORAL | Status: DC

## 2022-02-10 MED ORDER — POLYETHYLENE GLYCOL 3350 17 G PO PACK
17.0000 g | PACK | Freq: Two times a day (BID) | ORAL | Status: DC
  Administered 2022-02-10: 17 g via ORAL
  Filled 2022-02-10: qty 1

## 2022-02-10 MED ORDER — DICYCLOMINE HCL 10 MG PO CAPS: 20.0000 mg | ORAL_CAPSULE | Freq: Four times a day (QID) | ORAL | 0 refills | Status: DC | PRN

## 2022-02-10 NOTE — Plan of Care (Signed)
?  Problem: Education: ?Goal: Knowledge of General Education information will improve ?Description: Including pain rating scale, medication(s)/side effects and non-pharmacologic comfort measures ?02/10/2022 1400 by Santa Lighter, RN ?Outcome: Adequate for Discharge ?02/10/2022 1359 by Santa Lighter, RN ?Outcome: Progressing ?  ?Problem: Health Behavior/Discharge Planning: ?Goal: Ability to manage health-related needs will improve ?02/10/2022 1400 by Santa Lighter, RN ?Outcome: Adequate for Discharge ?02/10/2022 1359 by Santa Lighter, RN ?Outcome: Progressing ?  ?Problem: Clinical Measurements: ?Goal: Ability to maintain clinical measurements within normal limits will improve ?Outcome: Adequate for Discharge ?Goal: Will remain free from infection ?Outcome: Adequate for Discharge ?Goal: Diagnostic test results will improve ?Outcome: Adequate for Discharge ?Goal: Respiratory complications will improve ?Outcome: Adequate for Discharge ?Goal: Cardiovascular complication will be avoided ?Outcome: Adequate for Discharge ?  ?Problem: Activity: ?Goal: Risk for activity intolerance will decrease ?Outcome: Adequate for Discharge ?  ?Problem: Nutrition: ?Goal: Adequate nutrition will be maintained ?Outcome: Adequate for Discharge ?  ?Problem: Coping: ?Goal: Level of anxiety will decrease ?Outcome: Adequate for Discharge ?  ?Problem: Elimination: ?Goal: Will not experience complications related to bowel motility ?Outcome: Adequate for Discharge ?Goal: Will not experience complications related to urinary retention ?Outcome: Adequate for Discharge ?  ?Problem: Clinical Measurements: ?Goal: Complications related to the disease process, condition or treatment will be avoided or minimized ?Outcome: Adequate for Discharge ?  ?Problem: Fluid Volume: ?Goal: Will show no signs and symptoms of excessive bleeding ?Outcome: Adequate for Discharge ?  ?Problem: Bowel/Gastric: ?Goal: Will show no signs and symptoms of gastrointestinal  bleeding ?Outcome: Adequate for Discharge ?  ?Problem: Education: ?Goal: Ability to identify signs and symptoms of gastrointestinal bleeding will improve ?Outcome: Adequate for Discharge ?  ?Problem: Skin Integrity: ?Goal: Risk for impaired skin integrity will decrease ?Outcome: Adequate for Discharge ?  ?Problem: Safety: ?Goal: Ability to remain free from injury will improve ?Outcome: Adequate for Discharge ?  ?Problem: Pain Managment: ?Goal: General experience of comfort will improve ?Outcome: Adequate for Discharge ?  ?

## 2022-02-10 NOTE — Progress Notes (Addendum)
? ? ?Progress Note ? ? Subjective  ?Mild suprapubic pain, passing some flatus. ? ? Objective  ?Vital signs in last 24 hours: ?Temp:  [97.3 ?F (36.3 ?C)-98.8 ?F (37.1 ?C)] 98.3 ?F (36.8 ?C) (04/25 0800) ?Pulse Rate:  [25-83] 83 (04/25 0800) ?Resp:  [12-23] 19 (04/25 0800) ?BP: (96-143)/(54-88) 125/78 (04/25 0800) ?SpO2:  [95 %-100 %] 99 % (04/25 0800) ?Last BM Date : 02/09/22 ? ?General: Alert, well-developed, in NAD ?Heart:  Regular rate and rhythm; no murmurs ?Chest: Clear to ascultation bilaterally ?Abdomen:  Soft, mild suprapubic tenderness and mildly distended. Normal bowel sounds, without guarding, and without rebound.   ?Extremities:  Without edema. ?Neurologic:  Alert and  oriented x4; grossly normal neurologically. ?Psych:  Alert and cooperative. Normal mood and affect. ? ?Intake/Output from previous day: ?04/24 0701 - 04/25 0700 ?In: 3 [I.V.:3] ?Out: 1 [Urine:1] ?Intake/Output this shift: ?No intake/output data recorded. ? ?Lab Results: ?Recent Labs  ?  02/08/22 ?1500 02/09/22 ?0127  ?WBC 7.0 6.7  ?HGB 14.3 12.4  ?HCT 42.9 37.0  ?PLT 222 218  ? ?BMET ?Recent Labs  ?  02/08/22 ?1500 02/09/22 ?0127  ?NA 142 141  ?K 3.5 3.5  ?CL 106 111  ?CO2 23 26  ?GLUCOSE 95 103*  ?BUN 7* <5*  ?CREATININE 0.80 0.69  ?CALCIUM 9.3 8.5*  ? ?LFT ?Recent Labs  ?  02/09/22 ?0127  ?PROT 5.8*  ?ALBUMIN 3.4*  ?AST 16  ?ALT 10  ?ALKPHOS 86  ?BILITOT 0.5  ? ? ?Studies/Results: ?CT ABDOMEN PELVIS WO CONTRAST ? ?Result Date: 02/08/2022 ?CLINICAL DATA:  Diverticulitis, complication suspected * Tracking Code: BO * EXAM: CT ABDOMEN AND PELVIS WITHOUT CONTRAST TECHNIQUE: Multidetector CT imaging of the abdomen and pelvis was performed following the standard protocol without IV contrast. RADIATION DOSE REDUCTION: This exam was performed according to the departmental dose-optimization program which includes automated exposure control, adjustment of the mA and/or kV according to patient size and/or use of iterative reconstruction technique.  COMPARISON:  None. FINDINGS: Lower chest: No acute abnormality. Hepatobiliary: Multiple very ill-defined, hypodense liver lesions (series 5, image 26, 21). No gallstones, gallbladder wall thickening, or biliary dilatation. Pancreas: Unremarkable. No pancreatic ductal dilatation or surrounding inflammatory changes. Spleen: Normal in size without significant abnormality. Adrenals/Urinary Tract: Adrenal glands are unremarkable. Kidneys are normal, without renal calculi, solid lesion, or hydronephrosis. Bladder is unremarkable. Stomach/Bowel: Stomach is within normal limits. Appendix appears normal. Severe circumferential wall thickening of the distal sigmoid colon and rectum (series 5, image 69, series 3, image 82). Occasional sigmoid diverticula, which do not appear to be specifically involved. Vascular/Lymphatic: No significant vascular findings are present. Enlarged perirectal lymph nodes measuring up to 2.1 x 2.0 cm (series 5, image 61). Enlarged low left retroperitoneal nodes measuring up to 1.3 x 1.2 cm. Reproductive: Status post hysterectomy. Other: No abdominal wall hernia or abnormality. Trace ascites in the low pelvis. Musculoskeletal: No acute or significant osseous findings. IMPRESSION: 1. Severe circumferential wall thickening of the distal sigmoid colon and rectum. This appearance is highly concerning for rectal mass. 2. Enlarged perirectal and low left retroperitoneal lymph nodes, although possibly reactive highly concerning for nodal metastatic disease. 3. Multiple ill-defined hypodense liver lesions, highly concerning for hepatic metastases. 4. Trace ascites in the low pelvis. Electronically Signed   By: Delanna Ahmadi M.D.   On: 02/08/2022 16:32   ? ? ? Assessment & Recommendations  ? 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 Pathology pending.  Chest CT for staging is planned. Surgery has evaluated and recommends medical  oncology mgmt with neoadjuvant therapy with low anterior resection in the future.  ?Recommend:  ?Miralax once or twice daily to maintain loose or very soft bowel movements  ?Remain on a full liquid diet with Ensure supplement for now ?Dicyclomine 20 mg po q6h prn  ?Oncology appt with Dr. Benay Spice as outpatient on Friday ?Outpatient GI follow up with Dr. Dustin Flock prn ?GI signing off  ? ? ? LOS: 1 day  ? ?Pricilla Riffle. Fuller Plan MD 02/10/2022, 10:37 AM ?See Shea Evans, [facility], to contact our on call provider ?  ?

## 2022-02-10 NOTE — Plan of Care (Signed)
  Problem: Education: Goal: Knowledge of General Education information will improve Description Including pain rating scale, medication(s)/side effects and non-pharmacologic comfort measures Outcome: Progressing   Problem: Health Behavior/Discharge Planning: Goal: Ability to manage health-related needs will improve Outcome: Progressing   

## 2022-02-10 NOTE — Progress Notes (Signed)
Nsg Discharge Note ? ?Admit Date:  02/08/2022 ?Discharge date: 02/10/2022 ?  ?Karen Kaufman to be D/C'd Home per MD order.  AVS completed.   ?Removed IV-CDI. Reviewed d/c paperwork with patient and answered all questions. Wheeled stable patient and belongings to main entrance where she was picked up by her husband. ?Discharge Medication: ?Allergies as of 02/10/2022   ? ?   Reactions  ? Iodine Anaphylaxis  ? Shellfish Allergy Anaphylaxis  ? ?  ? ?  ?Medication List  ?  ? ?STOP taking these medications   ? ?aspirin-acetaminophen-caffeine 250-250-65 MG tablet ?Commonly known as: EXCEDRIN MIGRAINE ?  ? ?  ? ?TAKE these medications   ? ?atorvastatin 20 MG tablet ?Commonly known as: LIPITOR ?Take 1 tablet (20 mg total) by mouth daily. ?  ?bisacodyl 5 MG EC tablet ?Commonly known as: DULCOLAX ?Take 10 mg by mouth daily as needed for moderate constipation. ?  ?dicyclomine 10 MG capsule ?Commonly known as: BENTYL ?Take 2 capsules (20 mg total) by mouth every 6 (six) hours as needed for spasms (cramping). ?  ?MAGNESIUM PO ?Take 400 mg by mouth daily. ?  ?polyethylene glycol 17 g packet ?Commonly known as: MIRALAX / GLYCOLAX ?Take 17 g by mouth 2 (two) times daily. ?  ?vitamin B-12 1000 MCG tablet ?Commonly known as: CYANOCOBALAMIN ?Take 1 tablet (1,000 mcg total) by mouth daily. ?  ?Vitamin D (Ergocalciferol) 1.25 MG (50000 UNIT) Caps capsule ?Commonly known as: DRISDOL ?Take 1 capsule (50,000 Units total) by mouth every 7 (seven) days. ?  ? ?  ? ? ?Discharge Assessment: ?Vitals:  ? 02/10/22 0422 02/10/22 0800  ?BP: 121/70 125/78  ?Pulse: 74 83  ?Resp: 18 19  ?Temp: 98.7 ?F (37.1 ?C) 98.3 ?F (36.8 ?C)  ?SpO2: 97% 99%  ? Skin clean, dry and intact without evidence of skin break down, no evidence of skin tears noted. ?IV catheter discontinued intact. Site without signs and symptoms of complications - no redness or edema noted at insertion site, patient denies c/o pain - only slight tenderness at site.  Dressing with slight pressure  applied. ? ?D/c Instructions-Education: ?Discharge instructions given to patient/family with verbalized understanding. ?D/c education completed with patient/family including follow up instructions, medication list, d/c activities limitations if indicated, with other d/c instructions as indicated by MD - patient able to verbalize understanding, all questions fully answered. ?Patient instructed to return to ED, call 911, or call MD for any changes in condition.  ?Patient escorted via [place], and D/C home via private auto. ? ?Santa Lighter, RN ?02/10/2022 3:07 PM  ?

## 2022-02-10 NOTE — Discharge Summary (Signed)
? ?Physician Discharge Summary  ?Karen Kaufman ZOX:[id number] DOB: 05-04-1961 DOA: 02/08/2022 ? ?PCP: Karen Perking, FNP ? ?Admit date: 02/08/2022 ?Discharge date: 02/10/2022 ? ?Admitted From: home ?Disposition:  home ? ?Recommendations for Outpatient Follow-up:  ?Follow up with oncology in 3 days ?Please follow up on the following pending results: Rectal mass biopsy ? ?Home Health: none ?Equipment/Devices: none ? ?Discharge Condition: stable ?CODE STATUS: Full code ?Diet recommendation: full liquid ? ?HPI: Per admitting MD, ?Karen Kaufman is a 61 y.o. female with medical history significant of constipation, hyperlipidemia presenting with rectal bleeding. Patient reports episode of rectal bleeding this morning.  Has a history of constipation on stool softeners.  States due to work only takes his softeners on the weekends.  Has not had any rectal bleeding previously.  Does report a couple weeks of lower abdominal achy sensation that is intermittently improved with heating pad. Denies fevers chills chest pain shortness of breath, nausea, vomiting. ? ?Hospital Course / Discharge diagnoses: ?Principal Problem: ?  Rectal mass ?Active Problems: ?  Hypercholesterolemia ?  Rectal bleed ?  Hematochezia ?  Abnormal CT scan, colon ?  Colonic mass ?  Benign neoplasm of transverse colon ?  Colon distention ?  LLQ abdominal pain ?  Rectal cancer ([place]) ? ?Principal problem ?Rectal mass, likely metastatic malignancy-GI consulted, she was taken to the endoscopy suite, procedure showed a fungating almost completely obstructing medium-sized mass in the rectosigmoid colon, circumferential involving 100% of the lumen, no bleeding. This was biopsied.  Due to almost completely obstructing mass general surgery was consulted as well, patient was discussed with the colorectal team and did not recommend surgery at this point, but perhaps radiation/chemo and surgery in the future.  She is able to eat a full liquid diet, has already been set up  with oncology in 3 days and will be discharged home in stable condition with close outpatient follow-up. ? ?Active problems ?Hyperlipidemia-resume home medications on discharge ? ?Sepsis ruled out ? ? ?Discharge Instructions ? ? ?Allergies as of 02/10/2022   ? ?   Reactions  ? Iodine Anaphylaxis  ? Shellfish Allergy Anaphylaxis  ? ?  ? ?  ?Medication List  ?  ? ?STOP taking these medications   ? ?aspirin-acetaminophen-caffeine 250-250-65 MG tablet ?Commonly known as: EXCEDRIN MIGRAINE ?  ? ?  ? ?TAKE these medications   ? ?atorvastatin 20 MG tablet ?Commonly known as: LIPITOR ?Take 1 tablet (20 mg total) by mouth daily. ?  ?bisacodyl 5 MG EC tablet ?Commonly known as: DULCOLAX ?Take 10 mg by mouth daily as needed for moderate constipation. ?  ?dicyclomine 10 MG capsule ?Commonly known as: BENTYL ?Take 2 capsules (20 mg total) by mouth every 6 (six) hours as needed for spasms (cramping). ?  ?MAGNESIUM PO ?Take 400 mg by mouth daily. ?  ?polyethylene glycol 17 g packet ?Commonly known as: MIRALAX / GLYCOLAX ?Take 17 g by mouth 2 (two) times daily. ?  ?vitamin B-12 1000 MCG tablet ?Commonly known as: CYANOCOBALAMIN ?Take 1 tablet (1,000 mcg total) by mouth daily. ?  ?Vitamin D (Ergocalciferol) 1.25 MG (50000 UNIT) Caps capsule ?Commonly known as: DRISDOL ?Take 1 capsule (50,000 Units total) by mouth every 7 (seven) days. ?  ? ?  ? ? ? ?Consultations: ?Gastroenterology ?General surgery ? ?Procedures/Studies: ?Colonoscopy 4/24 ? ?CT ABDOMEN PELVIS WO CONTRAST ? ?Result Date: 02/08/2022 ?CLINICAL DATA:  Diverticulitis, complication suspected * Tracking Code: BO * EXAM: CT ABDOMEN AND PELVIS WITHOUT CONTRAST TECHNIQUE: Multidetector CT imaging of the  abdomen and pelvis was performed following the standard protocol without IV contrast. RADIATION DOSE REDUCTION: This exam was performed according to the departmental dose-optimization program which includes automated exposure control, adjustment of the mA and/or kV according to  patient size and/or use of iterative reconstruction technique. COMPARISON:  None. FINDINGS: Lower chest: No acute abnormality. Hepatobiliary: Multiple very ill-defined, hypodense liver lesions (series 5, image 26, 21). No gallstones, gallbladder wall thickening, or biliary dilatation. Pancreas: Unremarkable. No pancreatic ductal dilatation or surrounding inflammatory changes. Spleen: Normal in size without significant abnormality. Adrenals/Urinary Tract: Adrenal glands are unremarkable. Kidneys are normal, without renal calculi, solid lesion, or hydronephrosis. Bladder is unremarkable. Stomach/Bowel: Stomach is within normal limits. Appendix appears normal. Severe circumferential wall thickening of the distal sigmoid colon and rectum (series 5, image 69, series 3, image 82). Occasional sigmoid diverticula, which do not appear to be specifically involved. Vascular/Lymphatic: No significant vascular findings are present. Enlarged perirectal lymph nodes measuring up to 2.1 x 2.0 cm (series 5, image 61). Enlarged low left retroperitoneal nodes measuring up to 1.3 x 1.2 cm. Reproductive: Status post hysterectomy. Other: No abdominal wall hernia or abnormality. Trace ascites in the low pelvis. Musculoskeletal: No acute or significant osseous findings. IMPRESSION: 1. Severe circumferential wall thickening of the distal sigmoid colon and rectum. This appearance is highly concerning for rectal mass. 2. Enlarged perirectal and low left retroperitoneal lymph nodes, although possibly reactive highly concerning for nodal metastatic disease. 3. Multiple ill-defined hypodense liver lesions, highly concerning for hepatic metastases. 4. Trace ascites in the low pelvis. Electronically Signed   By: Delanna Ahmadi M.D.   On: 02/08/2022 16:32   ? ? ?Subjective: ?- no chest pain, shortness of breath, no abdominal pain, nausea or vomiting.  ? ?Discharge Exam: ?BP 125/78 (BP Location: Right Arm)   Pulse 83   Temp 98.3 ?F (36.8 ?C) (Oral)    Resp 19   Ht '5\' 6"'$  (1.676 m)   Wt 67.9 kg   SpO2 99%   BMI 24.16 kg/m?  ? ?General: Pt is alert, awake, not in acute distress ?Cardiovascular: RRR, S1/S2 +, no rubs, no gallops ?Respiratory: CTA bilaterally, no wheezing, no rhonchi ?Abdominal: Soft, NT, ND, bowel sounds + ?Extremities: no edema, no cyanosis ? ? ?The results of significant diagnostics from this hospitalization (including imaging, microbiology, ancillary and laboratory) are listed below for reference.   ? ? ?Microbiology: ?No results found for this or any previous visit (from the past 240 hour(s)).  ? ?Labs: ?Basic Metabolic Panel: ?Recent Labs  ?Lab 02/08/22 ?1500 02/09/22 ?0127  ?NA 142 141  ?K 3.5 3.5  ?CL 106 111  ?CO2 23 26  ?GLUCOSE 95 103*  ?BUN 7* <5*  ?CREATININE 0.80 0.69  ?CALCIUM 9.3 8.5*  ? ?Liver Function Tests: ?Recent Labs  ?Lab 02/08/22 ?1500 02/09/22 ?0127  ?AST 18 16  ?ALT 10 10  ?ALKPHOS 92 86  ?BILITOT 0.9 0.5  ?PROT 6.4* 5.8*  ?ALBUMIN 3.8 3.4*  ? ?CBC: ?Recent Labs  ?Lab 02/08/22 ?1500 02/09/22 ?0127  ?WBC 7.0 6.7  ?NEUTROABS 4.2  --   ?HGB 14.3 12.4  ?HCT 42.9 37.0  ?MCV 92.3 90.7  ?PLT 222 218  ? ?CBG: ?No results for input(s): GLUCAP in the last 168 hours. ?Hgb A1c ?No results for input(s): HGBA1C in the last 72 hours. ?Lipid Profile ?No results for input(s): CHOL, HDL, LDLCALC, TRIG, CHOLHDL, LDLDIRECT in the last 72 hours. ?Thyroid function studies ?No results for input(s): TSH, T4TOTAL, T3FREE, THYROIDAB in the last  72 hours. ? ?Invalid input(s): FREET3 ?Urinalysis ?   ?Component Value Date/Time  ? COLORURINE STRAW (A) 02/08/2022 1808  ? APPEARANCEUR CLEAR 02/08/2022 1808  ? LABSPEC 1.005 02/08/2022 1808  ? PHURINE 7.0 02/08/2022 1808  ? GLUCOSEU NEGATIVE 02/08/2022 1808  ? [place] NEGATIVE 02/08/2022 1808  ? [place] NEGATIVE 02/08/2022 1808  ? [place] Stain NEGATIVE 02/08/2022 1808  ? PROTEINUR NEGATIVE 02/08/2022 1808  ? NITRITE NEGATIVE 02/08/2022 1808  ? LEUKOCYTESUR NEGATIVE 02/08/2022 1808  ? ? ?FURTHER DISCHARGE  INSTRUCTIONS: ?  ?Get Medicines reviewed and adjusted: ?Please take all your medications with you for your next visit with your Primary MD ?  ?Laboratory/radiological data: ?Please request your Primary MD to

## 2022-02-11 ENCOUNTER — Encounter (HOSPITAL_COMMUNITY): Payer: Self-pay | Admitting: Gastroenterology

## 2022-02-13 ENCOUNTER — Encounter: Payer: Self-pay | Admitting: Nurse Practitioner

## 2022-02-13 ENCOUNTER — Inpatient Hospital Stay: Payer: BC Managed Care – PPO | Attending: Nurse Practitioner | Admitting: Nurse Practitioner

## 2022-02-13 VITALS — BP 129/66 | HR 65 | Temp 97.9°F | Resp 18 | Ht 66.0 in | Wt 146.0 lb

## 2022-02-13 DIAGNOSIS — C20 Malignant neoplasm of rectum: Secondary | ICD-10-CM

## 2022-02-13 NOTE — Progress Notes (Signed)
New Hematology/Oncology Consult ? ? ?Requesting MD: Dr. Johney Maine ? ?[phone] ? ?   ? ?Reason for Consult: Rectal cancer ? ?HPI: Karen Kaufman is a 61 year old woman referred for newly diagnosed rectal cancer.  She developed intermittent fecal incontinence in early 2022.  She noted onset of constipation in December 2022.  This progressively worsened necessitating use of a laxative.  She developed a constant aching discomfort at the low abdomen about 2 weeks ago.  On 02/08/2022 she had an episode of rectal bleeding.  This prompted a visit to the emergency department.  CT abdomen/pelvis without contrast showed severe circumferential wall thickening of the distal sigmoid colon and rectum; enlarged perirectal and low left retroperitoneal lymph nodes; multiple ill-defined hypodense liver lesions; trace ascites in the low pelvis.  She underwent a colonoscopy by Dr. Fuller Plan on 02/08/2022.  A fungating almost completely obstructing medium-sized mass was found in the rectosigmoid colon measuring from 12 to 17 cm.  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 ? ? ? ?Past Medical History:  ?Diagnosis Date  ? Allergy   ? Cancer [facility])   ? skin cancer removed by dermatology  ? Cataract   ? Hyperlipidemia   ?: ? ? ?Past Surgical History:  ?Procedure Laterality Date  ? ABDOMINAL HYSTERECTOMY  2002  ? BOWEL DECOMPRESSION N/A 02/09/2022  ? Procedure: BOWEL DECOMPRESSION;  Surgeon: Ladene Artist, MD;  Location: [place];  Service: Gastroenterology;  Laterality: N/A;  ? BRAIN SURGERY  1995  ? craniotomy-removal of tumor from behind right eye  ? BREAST SURGERY  2003  ? breast implants  ? COLONOSCOPY WITH PROPOFOL N/A 02/09/2022  ? Procedure: COLONOSCOPY WITH PROPOFOL;  Surgeon: Ladene Artist, MD;  Location: [facility];  Service: Gastroenterology;  Laterality: N/A;  ? EYE SURGERY    ? bilateral cataract removal  ? FLEXIBLE SIGMOIDOSCOPY N/A 02/09/2022  ? Procedure: FLEXIBLE SIGMOIDOSCOPY;  Surgeon: Ladene Artist, MD;  Location: [place];  Service: Gastroenterology;  Laterality: N/A;  ? POLYPECTOMY  02/09/2022  ? Procedure: POLYPECTOMY;  Surgeon: Ladene Artist, MD;  Location: [facility];  Service: Gastroenterology;;  ? ? ? ?Current Outpatient Medications:  ?  atorvastatin (LIPITOR) 20 MG tablet, Take 1 tablet (20 mg total) by mouth daily., Disp: 90 tablet, Rfl: 3 ?  MAGNESIUM PO, Take 400 mg by mouth daily., Disp: , Rfl:  ?  polyethylene glycol (MIRALAX / GLYCOLAX) 17 g packet, Take 17 g by mouth 2 (two) times daily., Disp: 60 packet, Rfl: 0 ?  vitamin B-12 (CYANOCOBALAMIN) 1000 MCG tablet, Take 1 tablet (1,000 mcg total) by mouth daily., Disp: 90 tablet, Rfl: 1 ?  bisacodyl (DULCOLAX) 5 MG EC tablet, Take 10 mg by mouth daily as needed for moderate constipation. (Patient not taking: Reported on 02/13/2022), Disp: , Rfl:  ?  dicyclomine (BENTYL) 10 MG capsule, Take 2 capsules (20 mg total) by mouth every 6 (six) hours as needed for spasms (cramping). (Patient not taking: Reported on 02/13/2022), Disp: 30 capsule, Rfl: 0 ?  Vitamin D, Ergocalciferol, (DRISDOL) 1.25 MG (50000 UNIT) CAPS capsule, Take 1 capsule (50,000 Units total) by mouth every 7 (seven) days. (Patient not taking: Reported on 02/08/2022), Disp: 12 capsule, Rfl: 0: ? ? ? ?Allergies  ?Allergen Reactions  ? Iodine Anaphylaxis  ? Shellfish Allergy Anaphylaxis  ? ? ?FH: Father prostate cancer, mother breast cancer, paternal aunt breast cancer, paternal cousin breast cancer and uterine or ovarian cancer, maternal grandfather Hodgkin's lymphoma ? ?SOCIAL HISTORY: She lives in  [place].  She is married.  She has 2 daughters.  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  No tobacco or alcohol use. ? ?Review of Systems: She has a good appetite.  She estimates losing 5 or 6 pounds recently.  He is having bowel movements.  She describes stool as pudding-like consistency.  She is taking MiraLAX.  No fevers or sweats.  No dysphagia.  No  shortness of breath or cough.  No hematuria.  No numbness or tingling in the hands or feet. ? ?Physical Exam: ? ?Blood pressure 129/66, pulse 65, temperature 97.9 ?F (36.6 ?C), resp. rate 18, height '5\' 6"'$  (1.676 m), weight 146 lb (66.2 kg), SpO2 100 %. ? ?HEENT: No thrush or ulcers. ?Lungs: Lungs clear bilaterally. ?Cardiac: Regular rate and rhythm. ?Abdomen: Abdomen is soft.  Tender over the low abdomen.  No hepatosplenomegaly.  No mass.   ?Vascular: No leg edema. ?Lymph nodes: No palpable cervical, supraclavicular, axillary or inguinal lymph nodes. ?Neurologic: Alert and oriented.  Gait normal. ?Skin: No rash. ? ?LABS: ?02/09/2022 CEA 3.2 ? ?RADIOLOGY: ? ?CT ABDOMEN PELVIS WO CONTRAST ? ?Result Date: 02/08/2022 ?CLINICAL DATA:  Diverticulitis, complication suspected * Tracking Code: BO * EXAM: CT ABDOMEN AND PELVIS WITHOUT CONTRAST TECHNIQUE: Multidetector CT imaging of the abdomen and pelvis was performed following the standard protocol without IV contrast. RADIATION DOSE REDUCTION: This exam was performed according to the departmental dose-optimization program which includes automated exposure control, adjustment of the mA and/or kV according to patient size and/or use of iterative reconstruction technique. COMPARISON:  None. FINDINGS: Lower chest: No acute abnormality. Hepatobiliary: Multiple very ill-defined, hypodense liver lesions (series 5, image 26, 21). No gallstones, gallbladder wall thickening, or biliary dilatation. Pancreas: Unremarkable. No pancreatic ductal dilatation or surrounding inflammatory changes. Spleen: Normal in size without significant abnormality. Adrenals/Urinary Tract: Adrenal glands are unremarkable. Kidneys are normal, without renal calculi, solid lesion, or hydronephrosis. Bladder is unremarkable. Stomach/Bowel: Stomach is within normal limits. Appendix appears normal. Severe circumferential wall thickening of the distal sigmoid colon and rectum (series 5, image 69, series 3, image  82). Occasional sigmoid diverticula, which do not appear to be specifically involved. Vascular/Lymphatic: No significant vascular findings are present. Enlarged perirectal lymph nodes measuring up to 2.1 x 2.0 cm (series 5, image 61). Enlarged low left retroperitoneal nodes measuring up to 1.3 x 1.2 cm. Reproductive: Status post hysterectomy. Other: No abdominal wall hernia or abnormality. Trace ascites in the low pelvis. Musculoskeletal: No acute or significant osseous findings. IMPRESSION: 1. Severe circumferential wall thickening of the distal sigmoid colon and rectum. This appearance is highly concerning for rectal mass. 2. Enlarged perirectal and low left retroperitoneal lymph nodes, although possibly reactive highly concerning for nodal metastatic disease. 3. Multiple ill-defined hypodense liver lesions, highly concerning for hepatic metastases. 4. Trace ascites in the low pelvis. Electronically Signed   By: Delanna Ahmadi M.D.   On: 02/08/2022 16:32   ? ?Assessment and Plan:  ? ?Colorectal cancer ?02/08/2022 CT abdomen/pelvis without contrast-severe circumferential wall thickening of the distal sigmoid colon and rectum; enlarged perirectal and low left retroperitoneal lymph nodes; multiple ill-defined hypodense liver lesions; trace ascites in the low pelvis.  ?02/08/2022 Colonoscopy by Dr. Charlyne Mom almost completely obstructing medium-sized mass in the rectosigmoid colon measuring from 12 to 17 cm.  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 ?02/09/2022 CEA 3.2 ?Rectal bleeding, lower abdominal pain, constipation secondary to #1 ?Multiple family members with cancer ?Hypercholesterolemia ? ?Karen Kaufman appears to have  rectal cancer likely metastatic to the liver.  We are referring her for an MRI of the liver to further evaluate the lesions noted on the noncontrast CT.  Plan for liver biopsy to confirm metastatic disease and obtain additional tissue for molecular testing.  She  will also be referred for a noncontrast CT scan of the chest to complete the staging evaluation.  Referral placed to interventional radiology for Port-A-Cath placement in anticipation of systemic therapy.

## 2022-02-16 ENCOUNTER — Other Ambulatory Visit: Payer: Self-pay | Admitting: *Deleted

## 2022-02-16 ENCOUNTER — Other Ambulatory Visit: Payer: Self-pay | Admitting: Oncology

## 2022-02-16 ENCOUNTER — Encounter: Payer: Self-pay | Admitting: *Deleted

## 2022-02-16 ENCOUNTER — Telehealth (HOSPITAL_COMMUNITY): Payer: Self-pay

## 2022-02-16 DIAGNOSIS — C20 Malignant neoplasm of rectum: Secondary | ICD-10-CM

## 2022-02-16 NOTE — Progress Notes (Signed)
PATIENT NAVIGATOR PROGRESS NOTE ? ?Name: Rynn Markiewicz ?Date: 02/16/2022 ?MRN: [id number]  ?DOB: [birth year] ? ? ?Reason for visit:  ?F/U after initial visit with Ned Card NP and Dr Benay Spice ? ?Comments:   ?Coordinating care for new patient with rectal cancer ?CT chest without contrast 5/3 at [facility] ?U/S guided biopsy of liver on Friday 5/5 at [facility] ?PAC by IR on 5/4 at [facility] ?MRI open of Liver at [facility] on 5/9 ?Surgical consult/referral sent to Dr Johney Maine ?GI conference on 5/10 ?Referrals for nutrition  ?Referral for Social work ?F/U with Ned Card NP and Dr Benay Spice on 5/12 ? ?Discussed appts with pt and verbalized understanding, active listening as coping with diagnosis and upcoming appts. ? ?Contact information reviewed and encouraged patient to call with any issues or questions ? ? ? ? ?Time spent counseling/coordinating care: > 60 minutes ? ?

## 2022-02-16 NOTE — Progress Notes (Unsigned)
Referrals placed to nutrition and social work ?

## 2022-02-16 NOTE — Telephone Encounter (Signed)
-----   Message from Suzette Battiest, MD sent at 02/16/2022  9:27 AM EDT ----- ?Regarding: RE: Liver biopsy ?Approved for ultrasound guided liver mass biopsy.  CEUS should be considered to increase visualization/diagnostic yield. ? ?Dylan ?----- Message ----- ?From: Gildardo Pounds D ?Sent: 02/16/2022   9:05 AM EDT ?To: Ir Procedure Requests ?Subject: Liver biopsy                                  ? ?Procedure: Liver lesion biopsy  ? ?Dx: Colorectal cancer, liver lesions ? ?Imaging: CT abd done 02/08/22 in epic ? ?Ordering: Ned Card, NP [phone] ? ?Please review.  ? ?Thanks,  ?Ash ? ? ?

## 2022-02-17 ENCOUNTER — Encounter: Payer: Self-pay | Admitting: Licensed Clinical Social Worker

## 2022-02-17 NOTE — Progress Notes (Signed)
[facility] Clinical Social Work  ?Initial Assessment ? ? ?Karen Kaufman is a 61 y.o. year old female contacted by phone. Clinical Social Work was referred by nurse navigator for assessment of psychosocial needs.  ? ?SDOH (Social Determinants of Health) assessments performed: Yes ?SDOH Interventions   ? ?Flowsheet Row Most Recent Value  ?SDOH Interventions   ?Food Insecurity Interventions Intervention Not Indicated  ?Financial Strain Interventions Intervention Not Indicated  ?Housing Interventions Intervention Not Indicated  ?Intimate Partner Violence Interventions Intervention Not Indicated  ?Physical Activity Interventions Intervention Not Indicated  ?Stress Interventions Intervention Not Indicated, Provide Counseling  ?Social Connections Interventions Intervention Not Indicated  ?Transportation Interventions Intervention Not Indicated, Patient Resources (Friends/Family)  ? ?  ?  ?Distress Screen completed: No ? ?  02/13/2022  ?  2:42 PM  ?ONCBCN DISTRESS SCREENING  ?Screening Type Initial Screening  ?Distress experienced in past week (1-10) 0  ?Physical Problem type Constipation/diarrhea;Pain  ?Physician notified of physical symptoms No  ?Referral to clinical psychology No  ?Referral to clinical social work No  ?Referral to dietition No  ?Referral to financial advocate No  ?Referral to support programs No  ?Referral to palliative care No  ? ? ? ? ?Family/Social Information:  ?Housing Arrangement: patient lives with spouse  Figley, DAVID (Spouse)  ?[phone]  ?Family members/support persons in your life? Family, Friends/Colleagues, and Community  ?Transportation concerns: no  ?Employment: Out on work excuse. Income source: Employment and patient currently on [place] ?Financial concerns: No ?Type of concern: None ?Food access concerns: no ?Religious or spiritual practice: yes ?Services Currently in place:  BCBS/FMLA ? ?Coping/ Adjustment to diagnosis: ?Patient understands treatment plan and what happens next? yes ?Concerns  about diagnosis and/or treatment: I'm not especially worried about anything ?Patient reported stressors: Anxiety and patient stated she is nervous but not very stressed. ?Hopes and priorities: N/A ?Patient enjoys being outside, watching TV, and time with family/ friends ?Current coping skills/ strengths: Ability for insight , Average or above average intelligence , Capable of independent living , Communication skills , Financial means , Motivation for treatment/growth , and Supportive family/friends  ? ? ? SUMMARY: ?Current SDOH Barriers:  ?Currently no SDOH barriers ? ?Clinical Social Work Clinical Goal(s):  ?Patient will contact CSW if there any concerns or needs. ? ?Interventions: ?Discussed common feeling and emotions when being diagnosed with cancer, and the importance of support during treatment ?Informed patient of the support team roles and support services at [facility] ?Provided CSW contact information and encouraged patient to call with any questions or concerns ?Provided patient with information about CSW role in patient care and other available resources. ? ? ?Follow Up Plan: Patient will contact CSW with any support or resource needs ?Patient verbalizes understanding of plan: Yes ? ? ? ?Taffy Delconte, LCSW ?

## 2022-02-18 ENCOUNTER — Ambulatory Visit (HOSPITAL_BASED_OUTPATIENT_CLINIC_OR_DEPARTMENT_OTHER)
Admission: RE | Admit: 2022-02-18 | Discharge: 2022-02-18 | Disposition: A | Discharge status: Home / Self Care | Payer: BC Managed Care – PPO | Source: Ambulatory Visit | Attending: Nurse Practitioner | Admitting: Nurse Practitioner

## 2022-02-18 ENCOUNTER — Other Ambulatory Visit: Payer: Self-pay | Admitting: Radiology

## 2022-02-18 ENCOUNTER — Other Ambulatory Visit (HOSPITAL_COMMUNITY): Payer: Self-pay | Admitting: Physician Assistant

## 2022-02-18 ENCOUNTER — Inpatient Hospital Stay: Payer: BC Managed Care – PPO | Attending: Hematology & Oncology | Admitting: Nutrition

## 2022-02-18 ENCOUNTER — Ambulatory Visit (HOSPITAL_COMMUNITY): Payer: BC Managed Care – PPO

## 2022-02-18 DIAGNOSIS — R1030 Lower abdominal pain, unspecified: Secondary | ICD-10-CM | POA: Insufficient documentation

## 2022-02-18 DIAGNOSIS — C20 Malignant neoplasm of rectum: Secondary | ICD-10-CM | POA: Insufficient documentation

## 2022-02-18 DIAGNOSIS — E7800 Pure hypercholesterolemia, unspecified: Secondary | ICD-10-CM | POA: Insufficient documentation

## 2022-02-18 DIAGNOSIS — Z452 Encounter for adjustment and management of vascular access device: Secondary | ICD-10-CM | POA: Insufficient documentation

## 2022-02-18 DIAGNOSIS — K5900 Constipation, unspecified: Secondary | ICD-10-CM | POA: Insufficient documentation

## 2022-02-18 DIAGNOSIS — Z5111 Encounter for antineoplastic chemotherapy: Secondary | ICD-10-CM | POA: Insufficient documentation

## 2022-02-18 DIAGNOSIS — K625 Hemorrhage of anus and rectum: Secondary | ICD-10-CM | POA: Insufficient documentation

## 2022-02-18 NOTE — Progress Notes (Signed)
61 year old female diagnosed with metastatic colorectal cancer and followed by Dr. Benay Spice. ? ?PMH includes Hyperlipidemia. ? ?Medications include Lipitor, magnesium, Miralax, Vitamin B12, Vitamin D. ? ?Labs include Glucose 103, BUN <5, and Albumin 3.4 on April 24. ? ?Height: 5'6". ?Weight: 145.4 pounds. ?UBW: per patient 156 pounds. ?BMI: 23.47. ? ?Patient referred for diet education on full liquid diet secondary to near complete obstruction. Tumor noted in high rectum.  ?Patient reports drinking 1-2 Ensure plus daily but reports it makes her very full. ?Endorses ~10 pound wt loss from UBW. ? ?Nutrition Diagnosis: ?Food and Nutrition Related Knowledge Deficit related to cancer and associated treatments as evidenced by no prior need for nutrition related information. ? ?Intervention: ?Educated patient and husband on full liquid diet.  ?Provided nutrition fact sheets. ?Continue bowel regimen per MD. ?Recommend drinking 2 Ensure plus high protein daily. Provided complimentary case of ensure high protein. ?Questions answered. Teach back method used. ? ?Monitoring, Evaluation, Goals: ?Patient will tolerated full liquid diet to minimize wt loss and minimize bowel obstruction. ? ?Next Visit: ?To be scheduled as needed with treatment. ?

## 2022-02-19 ENCOUNTER — Encounter: Payer: Self-pay | Admitting: *Deleted

## 2022-02-19 ENCOUNTER — Other Ambulatory Visit: Payer: Self-pay | Admitting: Radiology

## 2022-02-19 ENCOUNTER — Inpatient Hospital Stay (HOSPITAL_COMMUNITY): Admission: RE | Admit: 2022-02-19 | Payer: BC Managed Care – PPO | Source: Ambulatory Visit

## 2022-02-19 ENCOUNTER — Ambulatory Visit (HOSPITAL_COMMUNITY): Payer: BC Managed Care – PPO

## 2022-02-19 ENCOUNTER — Other Ambulatory Visit: Payer: Self-pay

## 2022-02-19 ENCOUNTER — Telehealth: Payer: Self-pay

## 2022-02-19 DIAGNOSIS — K769 Liver disease, unspecified: Secondary | ICD-10-CM

## 2022-02-19 NOTE — Telephone Encounter (Signed)
----- Message from Daryel November, MD sent at 02/18/2022  5:07 PM EDT ----- ?Regarding: FW: probable new cancer pt ?Hi Karen Kaufman,  ?Can you please order a liver MRI for Karen Kaufman to further evaluate liver lesions concerning for metastases? ? ?She has an appointment with oncology May 12th, so ideally would like to have the study done before then. ? ?Thanks,  ?Dr. Loletha Grayer ?----- Message ----- ?From: Ladene Artist, MD ?Sent: 02/18/2022   4:27 PM EDT ?To: Stark Klein, MD, Michael Boston, MD, # ?Subject: RE: probable new cancer pt                    ? ?Hi all,  ? ?I am looping in [facility], her primary gastroenterologist, to pursue the hepatic MRI and for further GI follow up.  ? ?Thanks,  ? ?Norberto Sorenson  ? ?----- Message ----- ?From: Truitt Merle, MD ?Sent: 02/18/2022   4:10 PM EDT ?To: Ladene Artist, MD, Stark Klein, MD, # ?Subject: RE: probable new cancer pt                    ? ?Norberto Sorenson, ? ?See the discussion below. This pt is scheduled to see Dr. Gearldine Shown NP on 5/12. Could you order an abdominal MRI to evaluate her liver lesion? None of [place] here have seen this pt. ? ?Thanks ? ?Krista Blue  ?----- Message ----- ?From: Dwan Bolt, MD ?Sent: 02/18/2022  11:37 AM EDT ?To: Stark Klein, MD, Michael Boston, MD, # ?Subject: RE: probable new cancer pt                    ? ?Sorry I'm just now getting to this. She should get an MRI of the liver. I think if those look consistent with liver mets she doesn't really need a biopsy. I can't really see much on her CT to determine if they are resectable but I'll look at the MRI once it's done. ?Thanks, ?Shelby  ? ?----- Message ----- ?From: Ladell Pier, MD ?Sent: 02/09/2022  11:21 AM EDT ?To: Stark Klein, MD, Michael Boston, MD, # ?Subject: RE: probable new cancer pt                    ? ?If there are liver mets we can arrange for a liver biopsy to confirm a diagnosis and get her started on systemic therapy ?We will try to get her seen this week ?----- Message ----- ?From: Michael Boston,  MD ?Sent: 02/09/2022   8:39 AM EDT ?To: Stark Klein, MD, Leighton Ruff, MD, # ?Subject: RE: probable new cancer pt                    ? ?Looks like the patient is getting colonoscopy by Dr. Fuller Plan today.  That can be illuminating.  May be a situation where he can set up for GI tumor board next Wednesday.  This Wednesday may be too soon ?----- Message ----- ?From: Clovis Riley, MD ?Sent: 02/08/2022   5:20 PM EDT ?To: Michael Boston, MD, Leighton Ruff, MD, # ?Subject: probable new cancer pt                        ? 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 Not obstructed but came in for bloody  stools. I asked them to get a CEA and get GI to figure out scoping her ASAP, but wasn't sure if there is a protocol for people like this who aren't necessarily getting admitted, and how soon you'd want to see her assuming she gets a quick tissue diagnosis, or if you even need to be involved initially if she is stage IV. Figured I would just get her on your radar.  ? ?Thanks ?Chelsea ? ?  ? ? ? ? ? ? ? ?

## 2022-02-19 NOTE — Progress Notes (Signed)
Faxed completed STD Review forms to employer (HR) [phone]. Copy to HIM to scan and originals will be provided to patient on 02/25/22. ?

## 2022-02-19 NOTE — Telephone Encounter (Signed)
Order placed for MR abd w w/o, rad scheduling to contact pt to schedule appt. Requested appt be scheduled prior to 5/12 oncology appt. ?

## 2022-02-20 ENCOUNTER — Ambulatory Visit (HOSPITAL_COMMUNITY)
Admission: RE | Admit: 2022-02-20 | Discharge: 2022-02-20 | Disposition: A | Discharge status: Home / Self Care | Payer: BC Managed Care – PPO | Source: Ambulatory Visit | Attending: Nurse Practitioner | Admitting: Nurse Practitioner

## 2022-02-20 ENCOUNTER — Other Ambulatory Visit: Payer: Self-pay | Admitting: Nurse Practitioner

## 2022-02-20 ENCOUNTER — Other Ambulatory Visit: Payer: Self-pay

## 2022-02-20 DIAGNOSIS — K769 Liver disease, unspecified: Secondary | ICD-10-CM | POA: Insufficient documentation

## 2022-02-20 DIAGNOSIS — C20 Malignant neoplasm of rectum: Secondary | ICD-10-CM

## 2022-02-20 DIAGNOSIS — E785 Hyperlipidemia, unspecified: Secondary | ICD-10-CM | POA: Insufficient documentation

## 2022-02-20 HISTORY — PX: IR IMAGING GUIDED PORT INSERTION: IMG5740

## 2022-02-20 HISTORY — PX: IR US GUIDE BX ASP/DRAIN: IMG2392

## 2022-02-20 LAB — CBC
HCT: 42.7 % (ref 36.0–46.0)
Hemoglobin: 14.5 g/dL (ref 12.0–15.0)
MCH: 30.6 pg (ref 26.0–34.0)
MCHC: 34 g/dL (ref 30.0–36.0)
MCV: 90.1 fL (ref 80.0–100.0)
Platelets: 254 10*3/uL (ref 150–400)
RBC: 4.74 MIL/uL (ref 3.87–5.11)
RDW: 11.8 % (ref 11.5–15.5)
WBC: 7.2 10*3/uL (ref 4.0–10.5)
nRBC: 0 % (ref 0.0–0.2)

## 2022-02-20 LAB — PROTIME-INR
INR: 0.9 (ref 0.8–1.2)
Prothrombin Time: 11.9 seconds (ref 11.4–15.2)

## 2022-02-20 MED ORDER — MIDAZOLAM HCL 2 MG/2ML IJ SOLN
INTRAMUSCULAR | Status: AC | PRN
  Administered 2022-02-20 (×2): .5 mg via INTRAVENOUS
  Administered 2022-02-20: 1 mg via INTRAVENOUS

## 2022-02-20 MED ORDER — FENTANYL CITRATE (PF) 100 MCG/2ML IJ SOLN
INTRAMUSCULAR | Status: AC | PRN
  Administered 2022-02-20 (×3): 25 ug via INTRAVENOUS
  Administered 2022-02-20: 50 ug via INTRAVENOUS

## 2022-02-20 MED ORDER — FENTANYL CITRATE (PF) 100 MCG/2ML IJ SOLN
INTRAMUSCULAR | Status: AC
  Filled 2022-02-20: qty 4

## 2022-02-20 MED ORDER — MIDAZOLAM HCL 2 MG/2ML IJ SOLN
INTRAMUSCULAR | Status: AC
  Filled 2022-02-20: qty 4

## 2022-02-20 MED ORDER — LIDOCAINE-EPINEPHRINE 1 %-1:100000 IJ SOLN
INTRAMUSCULAR | Status: AC
  Filled 2022-02-20: qty 1

## 2022-02-20 MED ORDER — HEPARIN SOD (PORK) LOCK FLUSH 100 UNIT/ML IV SOLN
INTRAVENOUS | Status: AC | PRN
  Administered 2022-02-20: 500 [IU] via INTRAVENOUS

## 2022-02-20 MED ORDER — SODIUM CHLORIDE 0.9 % IV SOLN
INTRAVENOUS | Status: AC | PRN
  Administered 2022-02-20: 10 mL/h via INTRAVENOUS

## 2022-02-20 MED ORDER — LIDOCAINE-EPINEPHRINE 1 %-1:100000 IJ SOLN
INTRAMUSCULAR | Status: AC | PRN
  Administered 2022-02-20: 20 mL

## 2022-02-20 MED ORDER — SODIUM CHLORIDE 0.9 % IV SOLN: INTRAVENOUS | Status: DC

## 2022-02-20 MED ORDER — HYDROCODONE-ACETAMINOPHEN 5-325 MG PO TABS: 1.0000 | ORAL_TABLET | ORAL | Status: DC | PRN

## 2022-02-20 MED ORDER — HEPARIN SOD (PORK) LOCK FLUSH 100 UNIT/ML IV SOLN
INTRAVENOUS | Status: AC
  Filled 2022-02-20: qty 5

## 2022-02-20 NOTE — H&P (Signed)
? ?Chief Complaint: ?Liver lesions. Request is for liver lesion biopsy and portacath placement. ? ?Referring Physician(s): ?Thomas,Lisa K ? ?Supervising Physician: Arne Cleveland ? ?Patient Status: [facility] - Out-pt ? ?History of Present Illness: ?Karen Kaufman is a 61 y.o. female outpatient. History of HLD,  newly diagnosed colorectal cancer. Found to have liver lesions while being worked up for diverticulitis. CT abd pelvis from 4.23.23 reads Multiple ill-defined hypodense liver lesions, highly concerning for hepatic metastases.  Trace ascites. Team is requesting liver biopsy for further evaluation of possible metastases  of possible rectosigmoid colon cancer. and a portacath placement for presumed chemotherapy access. Case reviewed and approved by Dr. Gretta Cool who recommends a contrast enhanced [place]. ? ?Patient alert and laying in bed, calm. Endorses lower abdominal pain she describes as "aching"/ Denies any fevers, headache, chest pain, SOB, cough, nausea, vomiting or bleeding. Return precautions and treatment recommendations and follow-up discussed with the patient  who is agreeable with the plan. ? ?Past Medical History:  ?Diagnosis Date  ? Allergy   ? Cancer [facility])   ? skin cancer removed by dermatology  ? Cataract   ? Hyperlipidemia   ? ? ?Past Surgical History:  ?Procedure Laterality Date  ? ABDOMINAL HYSTERECTOMY  2002  ? BOWEL DECOMPRESSION N/A 02/09/2022  ? Procedure: BOWEL DECOMPRESSION;  Surgeon: Ladene Artist, MD;  Location: [place];  Service: Gastroenterology;  Laterality: N/A;  ? BRAIN SURGERY  1995  ? craniotomy-removal of tumor from behind right eye  ? BREAST SURGERY  2003  ? breast implants  ? COLONOSCOPY WITH PROPOFOL N/A 02/09/2022  ? Procedure: COLONOSCOPY WITH PROPOFOL;  Surgeon: Ladene Artist, MD;  Location: [place];  Service: Gastroenterology;  Laterality: N/A;  ? EYE SURGERY    ? bilateral cataract removal  ? FLEXIBLE SIGMOIDOSCOPY N/A 02/09/2022  ? Procedure: FLEXIBLE  SIGMOIDOSCOPY;  Surgeon: Ladene Artist, MD;  Location: [place];  Service: Gastroenterology;  Laterality: N/A;  ? POLYPECTOMY  02/09/2022  ? Procedure: POLYPECTOMY;  Surgeon: Ladene Artist, MD;  Location: [place];  Service: Gastroenterology;;  ? ? ?Allergies: ?Iodine and Shellfish allergy ? ?Medications: ?Prior to Admission medications   ?Medication Sig Start Date End Date Taking? Authorizing Provider  ?atorvastatin (LIPITOR) 20 MG tablet Take 1 tablet (20 mg total) by mouth daily. 01/26/22  Yes Gwenlyn Perking, FNP  ?dicyclomine (BENTYL) 10 MG capsule Take 2 capsules (20 mg total) by mouth every 6 (six) hours as needed for spasms (cramping). 02/10/22  Yes Caren Griffins, MD  ?docusate sodium (COLACE) 100 MG capsule Take 200 mg by mouth daily.   Yes [provider]  ?magnesium oxide (MAG-OX) 400 MG tablet Take 400 mg by mouth daily.   Yes [provider]  ?polyethylene glycol (MIRALAX / GLYCOLAX) 17 g packet Take 17 g by mouth 2 (two) times daily. 02/10/22 03/12/22 Yes Gherghe, Vella Redhead, MD  ?vitamin B-12 (CYANOCOBALAMIN) 1000 MCG tablet Take 1 tablet (1,000 mcg total) by mouth daily. 01/27/22  Yes Gwenlyn Perking, FNP  ?Vitamin D, Ergocalciferol, (DRISDOL) 1.25 MG (50000 UNIT) CAPS capsule Take 1 capsule (50,000 Units total) by mouth every 7 (seven) days. ?Patient not taking: Reported on 02/08/2022 01/27/22   Gwenlyn Perking, FNP  ?  ? ?Family History  ?Problem Relation Age of Onset  ? Cancer Mother   ?     breast  ? Diabetes Mother   ? Hyperlipidemia Mother   ? Hypertension Mother   ? Cancer Father   ?  prostate  ? Arthritis Maternal Grandmother   ? Arthritis Maternal Grandfather   ? Cancer Maternal Grandfather   ?     hodgkins lymphoma  ? Arthritis Paternal Grandmother   ? Arthritis Paternal Grandfather   ? Diabetes Paternal Grandfather   ? Colon cancer Neg Hx   ? Pancreatic cancer Neg Hx   ? Esophageal cancer Neg Hx   ? ? ?Social History  ? ?Socioeconomic History  ? Marital  status: Married  ?  Spouse name: Not on file  ? Number of children: 2  ? Years of education: 1  ? Highest education level: Some college, no degree  ?Occupational History  ? Occupation: retired  ?Tobacco Use  ? Smoking status: Never  ? Smokeless tobacco: Never  ?Vaping Use  ? Vaping Use: Never used  ?Substance and Sexual Activity  ? Alcohol use: Not Currently  ? Drug use: Not Currently  ? Sexual activity: Yes  ?  Birth control/protection: Surgical  ?Other Topics Concern  ? Not on file  ?Social History Narrative  ? Not on file  ? ?Social Determinants of Health  ? ?Financial Resource Strain: Low Risk   ? Difficulty of Paying Living Expenses: Not very hard  ?Food Insecurity: No Food Insecurity  ? Worried About Charity fundraiser in the Last Year: Never true  ? Ran Out of Food in the Last Year: Never true  ?Transportation Needs: No Transportation Needs  ? Lack of Transportation (Medical): No  ? Lack of Transportation (Non-Medical): No  ?Physical Activity: Insufficiently Active  ? Days of Exercise per Week: 3 days  ? Minutes of Exercise per Session: 20 min  ?Stress: No Stress Concern Present  ? Feeling of Stress : Only a little  ?Social Connections: Socially Integrated  ? Frequency of Communication with Friends and Family: Three times a week  ? Frequency of Social Gatherings with Friends and Family: Three times a week  ? Attends Religious Services: 1 to 4 times per year  ? Active Member of Clubs or Organizations: Yes  ? Attends Archivist Meetings: 1 to 4 times per year  ? Marital Status: Married  ? ? ?Review of Systems: A 12 point ROS discussed and pertinent positives are indicated in the HPI above.  All other systems are negative. ? ?Review of Systems  ?Constitutional:  Negative for fatigue and fever.  ?HENT:  Negative for congestion.   ?Respiratory:  Negative for cough and shortness of breath.   ?Gastrointestinal:  Positive for abdominal pain (lower quadrants. described as aching). Negative for diarrhea,  nausea and vomiting.  ? ?Vital Signs: ?There were no vitals taken for this visit. ? ?Physical Exam ?Vitals and nursing note reviewed.  ?Constitutional:   ?   Appearance: She is well-developed.  ?HENT:  ?   Head: Normocephalic and atraumatic.  ?Eyes:  ?   Conjunctiva/sclera: Conjunctivae normal.  ?Cardiovascular:  ?   Rate and Rhythm: Normal rate and regular rhythm.  ?   Heart sounds: Normal heart sounds.  ?Pulmonary:  ?   Effort: Pulmonary effort is normal.  ?   Breath sounds: Normal breath sounds.  ?Musculoskeletal:  ?   Cervical back: Normal range of motion.  ?Neurological:  ?   Mental Status: She is alert and oriented to person, place, and time.  ?Psychiatric:     ?   Mood and Affect: Mood normal.     ?   Behavior: Behavior normal.     ?   Thought Content:  Thought content normal.     ?   Judgment: Judgment normal.  ? ? ?Imaging: ?CT ABDOMEN PELVIS WO CONTRAST ? ?Result Date: 02/08/2022 ?CLINICAL DATA:  Diverticulitis, complication suspected * Tracking Code: BO * EXAM: CT ABDOMEN AND PELVIS WITHOUT CONTRAST TECHNIQUE: Multidetector CT imaging of the abdomen and pelvis was performed following the standard protocol without IV contrast. RADIATION DOSE REDUCTION: This exam was performed according to the departmental dose-optimization program which includes automated exposure control, adjustment of the mA and/or kV according to patient size and/or use of iterative reconstruction technique. COMPARISON:  None. FINDINGS: Lower chest: No acute abnormality. Hepatobiliary: Multiple very ill-defined, hypodense liver lesions (series 5, image 26, 21). No gallstones, gallbladder wall thickening, or biliary dilatation. Pancreas: Unremarkable. No pancreatic ductal dilatation or surrounding inflammatory changes. Spleen: Normal in size without significant abnormality. Adrenals/Urinary Tract: Adrenal glands are unremarkable. Kidneys are normal, without renal calculi, solid lesion, or hydronephrosis. Bladder is unremarkable.  Stomach/Bowel: Stomach is within normal limits. Appendix appears normal. Severe circumferential wall thickening of the distal sigmoid colon and rectum (series 5, image 69, series 3, image 82). Occasional sigmoid diverticula,

## 2022-02-20 NOTE — Sedation Documentation (Signed)
Port a cath procedure complete ? ?

## 2022-02-20 NOTE — Procedures (Signed)
?  Procedure:  1.  Port catheter placement ?2. [place] core liver lesion biopsy R lobe 18g x4 ?Preprocedure diagnosis: The encounter diagnosis was Rectal cancer ([facility]). ? ?Postprocedure diagnosis: same ?EBL:    minimal ?Complications:   none immediate ? ?See full dictation in [facility]. ? ?D. Arne Cleveland MD ?Main # [phone] ?Pager  [phone] ?Mobile [phone] ?  ? ?

## 2022-02-23 ENCOUNTER — Encounter: Payer: Self-pay | Admitting: *Deleted

## 2022-02-23 ENCOUNTER — Other Ambulatory Visit: Payer: Self-pay | Admitting: *Deleted

## 2022-02-23 ENCOUNTER — Other Ambulatory Visit: Payer: Self-pay | Admitting: Nurse Practitioner

## 2022-02-23 DIAGNOSIS — C20 Malignant neoplasm of rectum: Secondary | ICD-10-CM

## 2022-02-23 LAB — SURGICAL PATHOLOGY

## 2022-02-23 MED ORDER — HYDROCODONE-ACETAMINOPHEN 5-325 MG PO TABS: 1.0000 | ORAL_TABLET | Freq: Four times a day (QID) | ORAL | 0 refills | Status: DC | PRN

## 2022-02-23 NOTE — Progress Notes (Signed)
PATIENT NAVIGATOR PROGRESS NOTE ? ?Name: Karen Kaufman ?Date: 02/23/2022 ?MRN: [id number]  ?DOB: 1961-04-18 ? ? ?Reason for visit:  ?Pt called for pain in lower abdomen ? ?Comments:  Received call from patient regarding pain in lower abdomen that wraps around to back on both sides.  "Up all night, walking the floor and could not find a comfortable position",   ?She does not have N/V or fever.  ?Having loose bowel movements with liquid diets and taking miralax twice a day for bowel regimen.  ?Has tried Tylenol and heating pad with no relief. ? ?Discussed with Dr Benay Spice and will prescribe Vicodin 5/325 for pain relief ? ?Instructed patient to call or go to ER if pain not improved or she develops fever or nausea/vomiting. Verbalized understanding ? ? ? ?Time spent counseling/coordinating care: 30-45 minutes ? ?

## 2022-02-24 ENCOUNTER — Other Ambulatory Visit: Payer: Self-pay | Admitting: Surgery

## 2022-02-24 ENCOUNTER — Encounter: Payer: Self-pay | Admitting: *Deleted

## 2022-02-24 ENCOUNTER — Encounter: Payer: Self-pay | Admitting: Surgery

## 2022-02-24 ENCOUNTER — Inpatient Hospital Stay: Admission: RE | Admit: 2022-02-24 | Payer: Self-pay | Source: Ambulatory Visit

## 2022-02-24 DIAGNOSIS — G4700 Insomnia, unspecified: Secondary | ICD-10-CM | POA: Insufficient documentation

## 2022-02-24 DIAGNOSIS — C20 Malignant neoplasm of rectum: Secondary | ICD-10-CM

## 2022-02-24 DIAGNOSIS — N951 Menopausal and female climacteric states: Secondary | ICD-10-CM | POA: Insufficient documentation

## 2022-02-24 NOTE — Progress Notes (Signed)
PATIENT NAVIGATOR PROGRESS NOTE ? ?Name: Karen Kaufman ?Date: 02/24/2022 ?MRN: [id number]  ?DOB: 1960/12/18 ? ? ?Reason for visit:  ?F/U with pain relief ? ?Comments:  Spoke with patient this am for F/U after episode of pain on Sunday/Monday, stated that she has taken 2 doses of pain med and experienced significant pain relief and was able to sleep last night and feels much better this am. ? ?She has consult with surgeon Dr Johney Maine today ? ?Will continue to follow ? ? ? ?Time spent counseling/coordinating care: 30-45 minutes ? ?

## 2022-02-25 ENCOUNTER — Other Ambulatory Visit: Payer: Self-pay

## 2022-02-25 ENCOUNTER — Other Ambulatory Visit: Payer: Self-pay | Admitting: Gastroenterology

## 2022-02-25 ENCOUNTER — Other Ambulatory Visit: Payer: Self-pay | Admitting: Oncology

## 2022-02-25 DIAGNOSIS — C20 Malignant neoplasm of rectum: Secondary | ICD-10-CM

## 2022-02-25 DIAGNOSIS — K769 Liver disease, unspecified: Secondary | ICD-10-CM

## 2022-02-25 NOTE — Telephone Encounter (Signed)
Olin Hauser calling from [facility] regarding the patients referral. Requesting a call back . She can be reached at [phone]. ?

## 2022-02-25 NOTE — Progress Notes (Signed)
The proposed treatment discussed in conference is for discussion purpose only and is not a binding recommendation.  The patients have not been physically examined, or presented with their treatment options.  Therefore, final treatment plans cannot be decided.  

## 2022-02-25 NOTE — Progress Notes (Signed)
START ON PATHWAY REGIMEN - Colorectal     A cycle is every 14 days:     Oxaliplatin      Leucovorin      Fluorouracil      Fluorouracil   **Always confirm dose/schedule in your pharmacy ordering system**  Patient Characteristics: Distant Metastases, Nonsurgical Candidate, KRAS/NRAS Mutation Positive/Unknown (BRAF V600 Wild-Type/Unknown), Standard Cytotoxic Therapy, First Line Standard Cytotoxic Therapy, Bevacizumab Ineligible, PS = 0,1 Tumor Location: Rectal Therapeutic Status: Distant Metastases Microsatellite/Mismatch Repair Status: Unknown BRAF Mutation Status: Awaiting Test Results KRAS/NRAS Mutation Status: Awaiting Test Results Standard Cytotoxic Line of Therapy: First Line Standard Cytotoxic Therapy ECOG Performance Status: 1 Bevacizumab Eligibility: Ineligible Intent of Therapy: Non-Curative / Palliative Intent, Discussed with Patient 

## 2022-02-25 NOTE — Telephone Encounter (Signed)
Lm on vm for Karen Kaufman to return call to the office. ?

## 2022-02-26 ENCOUNTER — Encounter (HOSPITAL_COMMUNITY): Payer: Self-pay

## 2022-02-26 NOTE — Telephone Encounter (Signed)
Olin Hauser returned call. She states that she is going to cancel the original MRI order that we placed because Ned Card, NP also ordered an MRI Liver and another provider ordered an MRI pelvis. Olin Hauser re-ordered the original MRI under your name so that she can CC the other providers and update the diagnosis codes. Olin Hauser just wanted to make you aware.  ?

## 2022-02-27 ENCOUNTER — Inpatient Hospital Stay: Admission: RE | Admit: 2022-02-27 | Payer: BC Managed Care – PPO | Source: Ambulatory Visit

## 2022-02-27 ENCOUNTER — Encounter (HOSPITAL_COMMUNITY): Payer: Self-pay

## 2022-02-27 ENCOUNTER — Inpatient Hospital Stay: Payer: BC Managed Care – PPO

## 2022-02-27 ENCOUNTER — Telehealth: Payer: Self-pay

## 2022-02-27 ENCOUNTER — Encounter: Payer: Self-pay | Admitting: Nurse Practitioner

## 2022-02-27 ENCOUNTER — Inpatient Hospital Stay: Payer: BC Managed Care – PPO | Admitting: Nurse Practitioner

## 2022-02-27 VITALS — BP 130/84 | HR 78 | Temp 98.1°F | Resp 18 | Ht 66.0 in | Wt 144.6 lb

## 2022-02-27 DIAGNOSIS — C20 Malignant neoplasm of rectum: Secondary | ICD-10-CM

## 2022-02-27 DIAGNOSIS — K5900 Constipation, unspecified: Secondary | ICD-10-CM | POA: Diagnosis not present

## 2022-02-27 DIAGNOSIS — Z5111 Encounter for antineoplastic chemotherapy: Secondary | ICD-10-CM | POA: Diagnosis not present

## 2022-02-27 DIAGNOSIS — K625 Hemorrhage of anus and rectum: Secondary | ICD-10-CM | POA: Diagnosis not present

## 2022-02-27 DIAGNOSIS — Z452 Encounter for adjustment and management of vascular access device: Secondary | ICD-10-CM | POA: Diagnosis not present

## 2022-02-27 DIAGNOSIS — R1030 Lower abdominal pain, unspecified: Secondary | ICD-10-CM | POA: Diagnosis not present

## 2022-02-27 DIAGNOSIS — E7800 Pure hypercholesterolemia, unspecified: Secondary | ICD-10-CM | POA: Diagnosis not present

## 2022-02-27 MED ORDER — ONDANSETRON HCL 8 MG PO TABS: 8.0000 mg | ORAL_TABLET | Freq: Three times a day (TID) | ORAL | 0 refills | Status: DC | PRN

## 2022-02-27 MED ORDER — PROCHLORPERAZINE MALEATE 10 MG PO TABS: 10.0000 mg | ORAL_TABLET | Freq: Four times a day (QID) | ORAL | 2 refills | Status: DC | PRN

## 2022-02-27 MED ORDER — LIDOCAINE-PRILOCAINE 2.5-2.5 % EX CREA: TOPICAL_CREAM | CUTANEOUS | 2 refills | Status: AC

## 2022-02-27 MED ORDER — HYDROCODONE-ACETAMINOPHEN 5-325 MG PO TABS: 1.0000 | ORAL_TABLET | Freq: Four times a day (QID) | ORAL | 0 refills | Status: DC | PRN

## 2022-02-27 NOTE — Progress Notes (Signed)
?[facility] ?OFFICE PROGRESS NOTE ? ? ?Diagnosis: Rectal cancer ? ?INTERVAL HISTORY:  ? ?Ms. Slagter returns as scheduled.  She is having low pelvic and back pain.  She takes 1 hydrocodone tablet about every 6 hours.  Bowels are moving with MiraLAX and Colace.  She continues a soft/liquid diet. ? ?Objective: ? ?Vital signs in last 24 hours: ? ?Blood pressure 130/84, pulse 78, temperature 98.1 ?F (36.7 ?C), temperature source Oral, resp. rate 18, height '5\' 6"'  (1.676 m), weight 144 lb 9.6 oz (65.6 kg), SpO2 100 %. ?  ? ?Resp: Lungs clear bilaterally. ?Cardio: Regular rate and rhythm. ?GI: Abdomen soft, mildly distended.  Tender over the low abdomen/pelvis.  Bowel sounds active.  No hepatomegaly. ?Vascular: No leg edema. ?Port-A-Cath without erythema. ? ?Lab Results: ? ?Lab Results  ?Component Value Date  ? WBC 7.2 02/20/2022  ? HGB 14.5 02/20/2022  ? HCT 42.7 02/20/2022  ? MCV 90.1 02/20/2022  ? PLT 254 02/20/2022  ? NEUTROABS 4.2 02/08/2022  ? ? ?Imaging: ? ?No results found. ? ?Medications: I have reviewed the patient's current medications. ? ?Assessment/Plan: ?Colorectal cancer ?02/08/2022 CT abdomen/pelvis without contrast-severe circumferential wall thickening of the distal sigmoid colon and rectum; enlarged perirectal and low left retroperitoneal lymph nodes; multiple ill-defined hypodense liver lesions; trace ascites in the low pelvis.  ?02/08/2022 Colonoscopy by Dr. Charlyne Mom almost completely obstructing medium-sized mass in the rectosigmoid colon measuring from 12 to 17 cm.  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  Foundation 1-microsatellite stable, tumor mutation burden 5, BRAFV600E, K-ras wild-type ?02/09/2022 CEA 3.2 ?CT chest 02/18/2022-chest adenopathy.  Multiple hypodense liver lesions. ?02/20/2022 biopsy of liver lesion-fragments of benign liver parenchyma with reactive changes.  No evidence of malignancy. ?MRI abdomen scheduled 03/01/2022 ?Cycle 1  FOLFOX 03/02/2022 ?Rectal bleeding, lower abdominal pain, constipation secondary to #1 ?Multiple family members with cancer ?Hypercholesterolemia ?Port-A-Cath placement 02/20/2022 ? ?Disposition: Ms. Kealey has metastatic colorectal cancer.  She is symptomatic with pain and constipation.  She is scheduled to begin FOLFOX 03/02/2022.  We reviewed potential toxicities associated with chemotherapy including marrow toxicity, nausea, hair loss, allergic reaction.  We reviewed the neuro toxicities associated with oxaliplatin.  We discussed potential toxicities associated 5-fluorouracil including mouth sores, diarrhea, hand-foot syndrome, skin rash, increased sensitivity to sun, skin hyperpigmentation.  She agrees to proceed.  She will attend a chemotherapy education class today.  She will return for cycle 1 FOLFOX 03/02/2022.  Prescription sent to her pharmacy for Compazine, Zofran and Emla cream.  Refill prescription also sent in for hydrocodone. ? ?We will see her in follow-up prior to cycle 2 FOLFOX.  She will contact the office in the interim with any problems. ? ?Patient seen with Dr. Benay Spice. ? ? ? ?Ned Card ANP/GNP-BC  ? ?02/27/2022  ?9:34 AM ? ?This was a shared visit with Ned Card.  Ms. Fakhouri was interviewed and examined.  Her case was presented at the GI tumor conference earlier this week.  She has been evaluated by Dr. Johney Maine.  The the consensus recommendation is to proceed with upfront systemic therapy.  The ultrasound-guided liver biopsy was nondiagnostic.  Potentially related to sampling error.  The liver will be further evaluated with an MRI.  Review of CT imaging at the GI conference this week is consistent with metastatic disease involving distant lymph nodes. ? ?I recommend FOLFOX chemotherapy.  We reviewed potential toxicities associated with the FOLFOX regimen today.  She agrees to proceed.  She will attend a chemotherapy teaching  class. ? ?A chemotherapy plan was entered. ? ?I was present for greater  than 50% of today's visit.  I performed medical decision making. ? ?Julieanne Manson, MD ? ? ? ? ? ?

## 2022-02-27 NOTE — Telephone Encounter (Signed)
Patient called in states she received a phone call from [facility] with instruction to use an enema. I let the patient know to reached out to Dr. Cruzita Lederer to see if it is okay to use a enema. Patient gave verbal understanding and no further questions or concerns ?

## 2022-02-27 NOTE — Telephone Encounter (Signed)
Patient to be scheduled for first chemo treatment on 03/02/22.  Per Dr. Benay Spice, ok to proceed with treatment with labs from 02/09/22.  ?

## 2022-03-01 ENCOUNTER — Inpatient Hospital Stay: Admission: RE | Admit: 2022-03-01 | Payer: BC Managed Care – PPO | Source: Ambulatory Visit

## 2022-03-01 ENCOUNTER — Other Ambulatory Visit: Payer: BC Managed Care – PPO

## 2022-03-01 ENCOUNTER — Ambulatory Visit
Admission: RE | Admit: 2022-03-01 | Discharge: 2022-03-01 | Disposition: A | Discharge status: Home / Self Care | Payer: BC Managed Care – PPO | Source: Ambulatory Visit | Attending: Gastroenterology | Admitting: Gastroenterology

## 2022-03-01 ENCOUNTER — Other Ambulatory Visit: Payer: Self-pay | Admitting: Oncology

## 2022-03-01 DIAGNOSIS — C20 Malignant neoplasm of rectum: Secondary | ICD-10-CM

## 2022-03-01 DIAGNOSIS — K769 Liver disease, unspecified: Secondary | ICD-10-CM

## 2022-03-01 MED ORDER — GADOBENATE DIMEGLUMINE 529 MG/ML IV SOLN
13.0000 mL | Freq: Once | INTRAVENOUS | Status: AC | PRN
  Administered 2022-03-01: 13 mL via INTRAVENOUS

## 2022-03-02 ENCOUNTER — Other Ambulatory Visit: Payer: BC Managed Care – PPO

## 2022-03-02 ENCOUNTER — Inpatient Hospital Stay: Payer: BC Managed Care – PPO

## 2022-03-02 VITALS — BP 127/80 | HR 86 | Temp 98.6°F | Resp 18 | Wt 144.0 lb

## 2022-03-02 DIAGNOSIS — C20 Malignant neoplasm of rectum: Secondary | ICD-10-CM

## 2022-03-02 DIAGNOSIS — Z5111 Encounter for antineoplastic chemotherapy: Secondary | ICD-10-CM | POA: Diagnosis not present

## 2022-03-02 MED ORDER — PALONOSETRON HCL INJECTION 0.25 MG/5ML
0.2500 mg | Freq: Once | INTRAVENOUS | Status: AC
  Administered 2022-03-02: 0.25 mg via INTRAVENOUS
  Filled 2022-03-02: qty 5

## 2022-03-02 MED ORDER — OXALIPLATIN CHEMO INJECTION 100 MG/20ML
85.0000 mg/m2 | Freq: Once | INTRAVENOUS | Status: AC
  Administered 2022-03-02: 150 mg via INTRAVENOUS
  Filled 2022-03-02: qty 30

## 2022-03-02 MED ORDER — SODIUM CHLORIDE 0.9 % IV SOLN
10.0000 mg | Freq: Once | INTRAVENOUS | Status: AC
  Administered 2022-03-02: 10 mg via INTRAVENOUS
  Filled 2022-03-02: qty 1

## 2022-03-02 MED ORDER — FLUOROURACIL CHEMO INJECTION 2.5 GM/50ML
400.0000 mg/m2 | Freq: Once | INTRAVENOUS | Status: AC
  Administered 2022-03-02: 700 mg via INTRAVENOUS
  Filled 2022-03-02: qty 14

## 2022-03-02 MED ORDER — LEUCOVORIN CALCIUM INJECTION 350 MG
400.0000 mg/m2 | Freq: Once | INTRAVENOUS | Status: AC
  Administered 2022-03-02: 696 mg via INTRAVENOUS
  Filled 2022-03-02: qty 34.8

## 2022-03-02 MED ORDER — DEXTROSE 5 % IV SOLN: Freq: Once | INTRAVENOUS | Status: AC

## 2022-03-02 MED ORDER — SODIUM CHLORIDE 0.9 % IV SOLN
2400.0000 mg/m2 | INTRAVENOUS | Status: DC
  Administered 2022-03-02: 4200 mg via INTRAVENOUS
  Filled 2022-03-02: qty 84

## 2022-03-02 NOTE — Patient Instructions (Addendum)
The chemotherapy medication bag should finish at 46 hours, 96 hours, or 7 days. For example, if your pump is scheduled for 46 hours and it was put on at 4:00 p.m., it should finish at 2:00 p.m. the day it is scheduled to come off regardless of your appointment time.   ?  ?Estimated time to finish at 1:30pm on Wednesday, 03/04/22. ?  ?If the display on your pump reads "Low Volume" and it is beeping, take the batteries out of the pump and come to the cancer center for it to be taken off.  ? ?If the pump alarms go off prior to the pump reading "Low Volume" then call [phone] and someone can assist you. ? ?If the plunger comes out and the chemotherapy medication is leaking out, please use your home chemo spill kit to clean up the spill. Do NOT use paper towels or other household products. ? ?If you have problems or questions regarding your pump, please call either [phone] (24 hours a day) or the cancer center Monday-Friday 8:00 a.m.- 4:30 p.m. at the clinic number and we will assist you. If you are unable to get assistance, then go to the nearest Emergency Department and ask the staff to contact the IV team for assistance.   ? ?[facility]  Discharge Instructions: ?Thank you for choosing [facility] to provide your oncology and hematology care.  ? ?If you have a lab appointment with the [facility], please go directly to the [facility] and check in at the registration area. ?  ?Wear comfortable clothing and clothing appropriate for easy access to any Portacath or PICC line.  ? ?We strive to give you quality time with your provider. You may need to reschedule your appointment if you arrive late (15 or more minutes).  Arriving late affects you and other patients whose appointments are after yours.  Also, if you miss three or more appointments without notifying the office, you may be dismissed from the clinic at the provider?s discretion.    ?  ?For prescription  refill requests, have your pharmacy contact our office and allow 72 hours for refills to be completed.   ? ?Today you received the following chemotherapy and/or immunotherapy agents: oxaliplatin, leucovorin, fluorouracil.     ?  ?To help prevent nausea and vomiting after your treatment, we encourage you to take your nausea medication as directed. ? ?BELOW ARE SYMPTOMS THAT SHOULD BE REPORTED IMMEDIATELY: ?*FEVER GREATER THAN 100.4 F (38 ?C) OR HIGHER ?*CHILLS OR SWEATING ?*NAUSEA AND VOMITING THAT IS NOT CONTROLLED WITH YOUR NAUSEA MEDICATION ?*UNUSUAL SHORTNESS OF BREATH ?*UNUSUAL BRUISING OR BLEEDING ?*URINARY PROBLEMS (pain or burning when urinating, or frequent urination) ?*BOWEL PROBLEMS (unusual diarrhea, constipation, pain near the anus) ?TENDERNESS IN MOUTH AND THROAT WITH OR WITHOUT PRESENCE OF ULCERS (sore throat, sores in mouth, or a toothache) ?UNUSUAL RASH, SWELLING OR PAIN  ?UNUSUAL VAGINAL DISCHARGE OR ITCHING  ? ?Items with * indicate a potential emergency and should be followed up as soon as possible or go to the Emergency Department if any problems should occur. ? ?Please show the CHEMOTHERAPY ALERT CARD or IMMUNOTHERAPY ALERT CARD at check-in to the Emergency Department and triage nurse. ? ?Should you have questions after your visit or need to cancel or reschedule your appointment, please contact [facility]  Dept: [phone]  and follow the prompts.  Office hours are 8:00 a.m. to 4:30 p.m. Monday - Friday. Please note that  voicemails left after 4:00 p.m. may not be returned until the following business day.  We are closed weekends and major holidays. You have access to a nurse at all times for urgent questions. Please call the main number to the clinic Dept: [phone] and follow the prompts. ? ? ?For any non-urgent questions, you may also contact your provider using MyChart. We now offer e-Visits for anyone 67 and older to request care online for non-urgent  symptoms. For details visit [email or web address]. ?  ?Also download the MyChart app! Go to the app store, search "MyChart", open the app, select [facility], and log in with your MyChart username and password. ? ?Due to Covid, a mask is required upon entering the hospital/clinic. If you do not have a mask, one will be given to you upon arrival. For doctor visits, patients may have 1 support person aged 52 or older with them. For treatment visits, patients cannot have anyone with them due to current Covid guidelines and our immunocompromised population.  ? ?Oxaliplatin Injection ?What is this medication? ?OXALIPLATIN (ox AL i PLA tin) is a chemotherapy drug. It targets fast dividing cells, like cancer cells, and causes these cells to die. This medicine is used to treat cancers of the colon and rectum, and many other cancers. ?This medicine may be used for other purposes; ask your health care provider or pharmacist if you have questions. ?COMMON BRAND NAME(S): Eloxatin ?What should I tell my care team before I take this medication? ?They need to know if you have any of these conditions: ?heart disease ?history of irregular heartbeat ?liver disease ?low blood counts, like white cells, platelets, or red blood cells ?lung or breathing disease, like asthma ?take medicines that treat or prevent blood clots ?tingling of the fingers or toes, or other nerve disorder ?an unusual or allergic reaction to oxaliplatin, other chemotherapy, other medicines, foods, dyes, or preservatives ?pregnant or trying to get pregnant ?breast-feeding ?How should I use this medication? ?This drug is given as an infusion into a vein. It is administered in a hospital or clinic by a specially trained health care professional. ?Talk to your pediatrician regarding the use of this medicine in children. Special care may be needed. ?Overdosage: If you think you have taken too much of this medicine contact a poison control center or emergency room at  once. ?NOTE: This medicine is only for you. Do not share this medicine with others. ?What if I miss a dose? ?It is important not to miss a dose. Call your doctor or health care professional if you are unable to keep an appointment. ?What may interact with this medication? ?Do not take this medicine with any of the following medications: ?cisapride ?dronedarone ?pimozide ?thioridazine ?This medicine may also interact with the following medications: ?aspirin and aspirin-like medicines ?certain medicines that treat or prevent blood clots like warfarin, apixaban, dabigatran, and rivaroxaban ?cisplatin ?cyclosporine ?diuretics ?medicines for infection like acyclovir, adefovir, amphotericin B, bacitracin, cidofovir, foscarnet, ganciclovir, gentamicin, pentamidine, vancomycin ?NSAIDs, medicines for pain and inflammation, like ibuprofen or naproxen ?other medicines that prolong the QT interval (an abnormal heart rhythm) ?pamidronate ?zoledronic acid ?This list may not describe all possible interactions. Give your health care provider a list of all the medicines, herbs, non-prescription drugs, or dietary supplements you use. Also tell them if you smoke, drink alcohol, or use illegal drugs. Some items may interact with your medicine. ?What should I watch for while using this medication? ?Your condition will be monitored carefully while you are receiving  this medicine. ?You may need blood work done while you are taking this medicine. ?This medicine may make you feel generally unwell. This is not uncommon as chemotherapy can affect healthy cells as well as cancer cells. Report any side effects. Continue your course of treatment even though you feel ill unless your healthcare professional tells you to stop. ?This medicine can make you more sensitive to cold. Do not drink cold drinks or use ice. Cover exposed skin before coming in contact with cold temperatures or cold objects. When out in cold weather wear warm clothing and  cover your mouth and nose to warm the air that goes into your lungs. Tell your doctor if you get sensitive to the cold. ?Do not become pregnant while taking this medicine or for 9 months after stopping it. Women sh

## 2022-03-02 NOTE — Progress Notes (Signed)
Pharmacist Chemotherapy Monitoring - Initial Assessment   ? ?Anticipated start date: 03/02/22  ? ?The following has been reviewed per standard work regarding the patient's treatment regimen: ?The patient's diagnosis, treatment plan and drug doses, and organ/hematologic function ?Lab orders and baseline tests specific to treatment regimen  ?The treatment plan start date, drug sequencing, and pre-medications ?Prior authorization status  ?Patient's documented medication list, including drug-drug interaction screen and prescriptions for anti-emetics and supportive care specific to the treatment regimen ?The drug concentrations, fluid compatibility, administration routes, and timing of the medications to be used ?The patient's access for treatment and lifetime cumulative dose history, if applicable  ?The patient's medication allergies and previous infusion related reactions, if applicable  ? ?Changes made to treatment plan:  ?N/A ? ?Follow up needed:  ?N/A ? ? ?Patrica Duel, [facility], ?03/02/2022  12:48 PM  ?

## 2022-03-02 NOTE — Progress Notes (Signed)
Patient presents for treatment. RN assessment completed along with the following: ? ?Labs/vitals reviewed - Yes, and Per Dr. Benay Spice, ok to proceed with CMP/CBC from 02/08/22 as this is her first chemo treatment.     ?Weight within 10% of previous measurement - Yes ?Informed consent completed and reflects current therapy/intent - Yes, on date 03/02/22             ?Provider progress note reviewed - Patient not seen by provider today. Most recent note dated 02/27/22 reviewed. ?Treatment/Antibody/Supportive plan reviewed - Yes, and there are no adjustments needed for today's treatment. ?S&H and other orders reviewed - Yes, and there are no additional orders identified. ?Previous treatment date reviewed - Yes, and the appropriate amount of time has elapsed between treatments. This is patient's first treatment.  ? ?Patient to proceed with treatment.   ?

## 2022-03-03 ENCOUNTER — Telehealth: Payer: Self-pay

## 2022-03-03 LAB — SURGICAL PATHOLOGY

## 2022-03-03 NOTE — Telephone Encounter (Signed)
Called patient for first time chemo follow-up.  Patient stated she was having increased abdominal pain. Patient stated her last dose of her home pain med was last night (03/02/22).  Patient stated she was nervous about taking pain medication because it makes her constipated.  Patient states last BM was this AM (03/03/22).  Instructed patient to take another dose of her pain medication and to continue to take her stool softener/laxative.  Ned Card, NP made aware and agreed with these instructions.  Patient stated she had called Dr. Clyda Greener office regarding her abdominal pain but had not heard back from their office yet.  ?Patient also stated she has not eaten anything as she has been too nervous to with the cold sensitivity (side effect of the oxaliplatin).  Instructed patient that she needs to eat something, as not eating can also cause decrease in BM and decreased tolerance of chemotherapy.  Patient agreed to heat up chicken soup and to try to eat it after our phone conversation.   ?Patient stated she had troubles sleeping last night and also had an event where she felt her heart was racing.  Reminded patient she had received a steroid during her chemo treatment on 03/02/22, which can cause lack of sleep and some heart palpitations.  Instructed patient to call office if she notices the heart palpitations more frequently, or to call 911 if she became SOB or if the heart palpitations did not stop.   ?Patient verbalized understanding and all questions were answered during this phone call.   ?Reminded patient of appointment for pump d/c on 03/04/22 and instructed patient to call office if she had any further questions or concerns.  ?

## 2022-03-04 ENCOUNTER — Inpatient Hospital Stay: Payer: BC Managed Care – PPO

## 2022-03-04 ENCOUNTER — Telehealth: Payer: Self-pay | Admitting: Nutrition

## 2022-03-04 VITALS — BP 120/69 | HR 85 | Temp 98.2°F | Resp 18

## 2022-03-04 DIAGNOSIS — Z5111 Encounter for antineoplastic chemotherapy: Secondary | ICD-10-CM | POA: Diagnosis not present

## 2022-03-04 DIAGNOSIS — C20 Malignant neoplasm of rectum: Secondary | ICD-10-CM

## 2022-03-04 MED ORDER — SODIUM CHLORIDE 0.9% FLUSH
10.0000 mL | INTRAVENOUS | Status: DC | PRN
  Administered 2022-03-04: 10 mL

## 2022-03-04 MED ORDER — HEPARIN SOD (PORK) LOCK FLUSH 100 UNIT/ML IV SOLN
500.0000 [IU] | Freq: Once | INTRAVENOUS | Status: AC | PRN
  Administered 2022-03-04: 500 [IU]

## 2022-03-04 NOTE — Progress Notes (Signed)
Pt here for pump stop. Complaining of extreme weakness. Pt had to be helped up from sofa by husband.  Her main problem is constipation with back and abdominal pain and bloating. Pt had a small bowel movement last night. Taking Miralax as ordered except this morning she was unable to drink it.  Pt says she is unable to drink anything unless it is hot. Taking pain medication only every 12 hours instead of the ordered 6 hours because it causes constipation. Leander Rams, NP and Cristy Friedlander, RN notified. ?

## 2022-03-04 NOTE — Telephone Encounter (Signed)
Contacted patient by telephone at provider's request. ? ?Patient reports she took her pain medication as instructed after she left infusion today and her pain improved. She is trying to eat a little and is planning on taking her Miralax as instructed. She is unable to drink Ensure warmed up because it doesn't taste good to her. Reports cold sensitivity is severe so that she cannot always drink room temperature liquids. Verbalizes hope that will improve quickly. Reviewed full liquid diet and clarified it was ok to drink no pulp orange juice. Patient verbalized understanding.Recommended she resume ensure as soon as tolerated. Encouraged her to contact RD for further nutrition related questions. ?

## 2022-03-04 NOTE — Progress Notes (Signed)
Lisa Jermani Eberlein, NP at chairside.   

## 2022-03-04 NOTE — Patient Instructions (Signed)

## 2022-03-06 ENCOUNTER — Telehealth: Payer: Self-pay

## 2022-03-06 NOTE — Telephone Encounter (Signed)
Karen Kaufman called in and stated she  have not had a bowel movement since Sunday. She is constipation with back and abdominal pain and bloating. She is taking Miralax and colace as ordered. She is taking pain medication only every 12 hours instead of the ordered every 6 hours  due to constipation. She do not have a fever or have not being nausea. Patient also called Dr. Cruzita Lederer with the same concerns. She is waiting for the Dr Cruzita Lederer to call her back. I advice the patient to continue to take her medication as order. If the pain get worse she should go to the ER. Patient gave verbal understanding

## 2022-03-06 NOTE — Progress Notes (Signed)
Reviewed the MRI with the patient and informed her that the liver lesions are consistent with metastatic disease.  I informed her that although I am not primarily involved with treating cancer, per Dr. Johney Maine there is no role for surgical resection of the liver mets, and the current plan is to continue chemotherapy with the hopes of shrinking tumor burden. Patient is increasing her Miralax to three times a day.  She is bloated and uncomfortable, but is still passing small amounts of stool.  She is aware of recommendations from Dr. Johney Maine about when to go to ED.

## 2022-03-06 NOTE — Telephone Encounter (Signed)
Patient called back to let us know what Dr. Johney Maine recommend. Patient stated the provider want her to increase the dose of her colace and miralax, to continue the fluids. Patient also stated Dr Johney Maine suggested Dr. Benay Spice to  look at her scan from 03/01/22. Per Dr. Benay Spice he agree with Dr. Johney Maine to increase  her Colace twice daily. Patient gave verbal understanding.

## 2022-03-09 ENCOUNTER — Encounter: Payer: Self-pay | Admitting: *Deleted

## 2022-03-09 NOTE — Progress Notes (Signed)
PATIENT NAVIGATOR PROGRESS NOTE  Name: Karen Kaufman Date: 03/09/2022 MRN: [id number]  DOB: 1961/08/14   Reason for visit:  Telephone F/U  Comments:  Patient called this am with questions and issues after first FOLFOX treatment. Hydration goals after treatment. Pt states that she feels she is not drinking enough Reports that she is drinking at least 3 bottles (24oz each) water each day. We discussed taking sips all day and that she is doing well with hydration. We discussed adding in fruit juices and ginger teas to help settle stomach. She has only taken one dose of Compazine day after treatment and one of dose on Ondansetron last night when she felt nausea. She described relief with Ondansetron We discussed walking daily for short periods of time, for instance walking to mailbox 2-3 times a day and resting between We discussed full liquid diet and management of gas/bloating, she is getting relief from gas x and is moving her bowels regularly with miralax and colace. We discussed taking in small amounts of full liquids every few hours to maintain caloric intake without feeling overly full.  She states that she is only taking Hydrocodone at night and that helps her rest well.  We discussed that during the day she can take extra strength Tylenol for relief during day so that she is not getting drowsy from hydrocodone We discussed result from MR that it showed liver lesions and that based on those results it does not change therapy at this time and she can discuss that further with Dr Benay Spice at next appt. Reinforced to call with any issues or questions    Time spent counseling/coordinating care: > 60 minutes

## 2022-03-11 ENCOUNTER — Emergency Department (HOSPITAL_COMMUNITY): Payer: BC Managed Care – PPO

## 2022-03-11 ENCOUNTER — Inpatient Hospital Stay (HOSPITAL_COMMUNITY)
Admission: EM | Admit: 2022-03-11 | Discharge: 2022-03-20 | DRG: 330 | Disposition: A | Discharge status: Home Health | Payer: BC Managed Care – PPO | Attending: Internal Medicine | Admitting: Internal Medicine

## 2022-03-11 ENCOUNTER — Encounter: Payer: Self-pay | Admitting: *Deleted

## 2022-03-11 ENCOUNTER — Encounter (HOSPITAL_COMMUNITY): Payer: Self-pay

## 2022-03-11 ENCOUNTER — Other Ambulatory Visit: Payer: Self-pay

## 2022-03-11 DIAGNOSIS — E871 Hypo-osmolality and hyponatremia: Secondary | ICD-10-CM | POA: Diagnosis not present

## 2022-03-11 DIAGNOSIS — Z933 Colostomy status: Secondary | ICD-10-CM

## 2022-03-11 DIAGNOSIS — Z9882 Breast implant status: Secondary | ICD-10-CM

## 2022-03-11 DIAGNOSIS — Z85828 Personal history of other malignant neoplasm of skin: Secondary | ICD-10-CM

## 2022-03-11 DIAGNOSIS — C189 Malignant neoplasm of colon, unspecified: Secondary | ICD-10-CM | POA: Diagnosis not present

## 2022-03-11 DIAGNOSIS — M4856XA Collapsed vertebra, not elsewhere classified, lumbar region, initial encounter for fracture: Secondary | ICD-10-CM | POA: Diagnosis present

## 2022-03-11 DIAGNOSIS — K56691 Other complete intestinal obstruction: Secondary | ICD-10-CM | POA: Diagnosis present

## 2022-03-11 DIAGNOSIS — C779 Secondary and unspecified malignant neoplasm of lymph node, unspecified: Secondary | ICD-10-CM | POA: Diagnosis present

## 2022-03-11 DIAGNOSIS — Z91013 Allergy to seafood: Secondary | ICD-10-CM

## 2022-03-11 DIAGNOSIS — E861 Hypovolemia: Secondary | ICD-10-CM | POA: Diagnosis present

## 2022-03-11 DIAGNOSIS — S32010A Wedge compression fracture of first lumbar vertebra, initial encounter for closed fracture: Secondary | ICD-10-CM | POA: Diagnosis not present

## 2022-03-11 DIAGNOSIS — E7800 Pure hypercholesterolemia, unspecified: Secondary | ICD-10-CM | POA: Diagnosis present

## 2022-03-11 DIAGNOSIS — Z91048 Other nonmedicinal substance allergy status: Secondary | ICD-10-CM | POA: Diagnosis not present

## 2022-03-11 DIAGNOSIS — Z8719 Personal history of other diseases of the digestive system: Secondary | ICD-10-CM

## 2022-03-11 DIAGNOSIS — Z79899 Other long term (current) drug therapy: Secondary | ICD-10-CM

## 2022-03-11 DIAGNOSIS — R590 Localized enlarged lymph nodes: Secondary | ICD-10-CM | POA: Diagnosis present

## 2022-03-11 DIAGNOSIS — K56609 Unspecified intestinal obstruction, unspecified as to partial versus complete obstruction: Secondary | ICD-10-CM | POA: Diagnosis not present

## 2022-03-11 DIAGNOSIS — E876 Hypokalemia: Secondary | ICD-10-CM | POA: Diagnosis present

## 2022-03-11 DIAGNOSIS — C20 Malignant neoplasm of rectum: Secondary | ICD-10-CM | POA: Diagnosis not present

## 2022-03-11 DIAGNOSIS — T451X5A Adverse effect of antineoplastic and immunosuppressive drugs, initial encounter: Secondary | ICD-10-CM | POA: Diagnosis present

## 2022-03-11 DIAGNOSIS — D701 Agranulocytosis secondary to cancer chemotherapy: Secondary | ICD-10-CM | POA: Diagnosis present

## 2022-03-11 DIAGNOSIS — C19 Malignant neoplasm of rectosigmoid junction: Secondary | ICD-10-CM | POA: Diagnosis present

## 2022-03-11 DIAGNOSIS — D649 Anemia, unspecified: Secondary | ICD-10-CM | POA: Diagnosis present

## 2022-03-11 DIAGNOSIS — Z83438 Family history of other disorder of lipoprotein metabolism and other lipidemia: Secondary | ICD-10-CM | POA: Diagnosis not present

## 2022-03-11 DIAGNOSIS — C787 Secondary malignant neoplasm of liver and intrahepatic bile duct: Secondary | ICD-10-CM | POA: Diagnosis present

## 2022-03-11 DIAGNOSIS — R188 Other ascites: Secondary | ICD-10-CM | POA: Diagnosis present

## 2022-03-11 LAB — COMPREHENSIVE METABOLIC PANEL
ALT: 28 U/L (ref 0–44)
AST: 29 U/L (ref 15–41)
Albumin: 3.7 g/dL (ref 3.5–5.0)
Alkaline Phosphatase: 121 U/L (ref 38–126)
Anion gap: 12 (ref 5–15)
BUN: 9 mg/dL (ref 8–23)
CO2: 27 mmol/L (ref 22–32)
Calcium: 8.9 mg/dL (ref 8.9–10.3)
Chloride: 94 mmol/L — ABNORMAL LOW (ref 98–111)
Creatinine, Ser: 0.63 mg/dL (ref 0.44–1.00)
GFR, Estimated: >60 mL/min (ref >60)
Glucose, Bld: 102 mg/dL — ABNORMAL HIGH (ref 70–99)
Potassium: 3.2 mmol/L — ABNORMAL LOW (ref 3.5–5.1)
Sodium: 133 mmol/L — ABNORMAL LOW (ref 135–145)
Total Bilirubin: 0.9 mg/dL (ref 0.3–1.2)
Total Protein: 6.9 g/dL (ref 6.5–8.1)

## 2022-03-11 LAB — CBC
HCT: 40.1 % (ref 36.0–46.0)
Hemoglobin: 14 g/dL (ref 12.0–15.0)
MCH: 30.7 pg (ref 26.0–34.0)
MCHC: 34.9 g/dL (ref 30.0–36.0)
MCV: 87.9 fL (ref 80.0–100.0)
Platelets: 206 10*3/uL (ref 150–400)
RBC: 4.56 MIL/uL (ref 3.87–5.11)
RDW: 11.4 % — ABNORMAL LOW (ref 11.5–15.5)
WBC: 3.2 10*3/uL — ABNORMAL LOW (ref 4.0–10.5)
nRBC: 0 % (ref 0.0–0.2)

## 2022-03-11 LAB — URINALYSIS, ROUTINE W REFLEX MICROSCOPIC
Bacteria, UA: NONE SEEN
Bilirubin Urine: NEGATIVE
Glucose, UA: NEGATIVE mg/dL
Hgb urine dipstick: NEGATIVE
Ketones, ur: 80 mg/dL — AB
Leukocytes,Ua: NEGATIVE
Nitrite: NEGATIVE
Protein, ur: NEGATIVE mg/dL
Specific Gravity, Urine: 1.02 (ref 1.005–1.030)
pH: 5 (ref 5.0–8.0)

## 2022-03-11 LAB — HEMOGLOBIN A1C
Hgb A1c MFr Bld: 5.5 % (ref 4.8–5.6)
Mean Plasma Glucose: 111.15 mg/dL

## 2022-03-11 LAB — LIPASE, BLOOD: Lipase: 24 U/L (ref 11–51)

## 2022-03-11 LAB — MAGNESIUM: Magnesium: 2.3 mg/dL (ref 1.7–2.4)

## 2022-03-11 MED ORDER — ONDANSETRON HCL 4 MG/2ML IJ SOLN
4.0000 mg | Freq: Four times a day (QID) | INTRAMUSCULAR | Status: DC | PRN
  Administered 2022-03-14 – 2022-03-18 (×5): 4 mg via INTRAVENOUS
  Filled 2022-03-11 (×7): qty 2

## 2022-03-11 MED ORDER — METRONIDAZOLE 500 MG PO TABS
1000.0000 mg | ORAL_TABLET | ORAL | Status: AC
  Administered 2022-03-11 (×2): 1000 mg via ORAL
  Filled 2022-03-11 (×3): qty 2

## 2022-03-11 MED ORDER — DEXTROSE 5 % IV SOLN: 500.0000 mg | Freq: Four times a day (QID) | INTRAVENOUS | Status: DC | PRN

## 2022-03-11 MED ORDER — MENTHOL 3 MG MT LOZG
1.0000 | LOZENGE | OROMUCOSAL | Status: DC | PRN
  Filled 2022-03-11: qty 9

## 2022-03-11 MED ORDER — MORPHINE SULFATE (PF) 4 MG/ML IV SOLN
4.0000 mg | Freq: Once | INTRAVENOUS | Status: AC
  Administered 2022-03-11: 4 mg via INTRAVENOUS
  Filled 2022-03-11: qty 1

## 2022-03-11 MED ORDER — LACTATED RINGERS IV BOLUS
1000.0000 mL | Freq: Three times a day (TID) | INTRAVENOUS | Status: DC | PRN
Start: 2022-03-11 — End: 2022-03-13

## 2022-03-11 MED ORDER — ACETAMINOPHEN 500 MG PO TABS: 1000.0000 mg | ORAL_TABLET | ORAL | Status: AC

## 2022-03-11 MED ORDER — ENOXAPARIN SODIUM 40 MG/0.4ML IJ SOSY: 40.0000 mg | PREFILLED_SYRINGE | INTRAMUSCULAR | Status: AC

## 2022-03-11 MED ORDER — ONDANSETRON HCL 40 MG/20ML IJ SOLN
8.0000 mg | Freq: Four times a day (QID) | INTRAMUSCULAR | Status: DC | PRN
Start: 2022-03-11 — End: 2022-03-20

## 2022-03-11 MED ORDER — ONDANSETRON HCL 4 MG/2ML IJ SOLN
4.0000 mg | Freq: Once | INTRAMUSCULAR | Status: AC
  Administered 2022-03-11: 4 mg via INTRAVENOUS
  Filled 2022-03-11: qty 2

## 2022-03-11 MED ORDER — CELECOXIB 200 MG PO CAPS: 200.0000 mg | ORAL_CAPSULE | ORAL | Status: AC

## 2022-03-11 MED ORDER — SODIUM CHLORIDE 0.9 % IV BOLUS (SEPSIS)
1000.0000 mL | Freq: Once | INTRAVENOUS | Status: AC
  Administered 2022-03-11: 1000 mL via INTRAVENOUS

## 2022-03-11 MED ORDER — ALVIMOPAN 12 MG PO CAPS: 12.0000 mg | ORAL_CAPSULE | ORAL | Status: AC

## 2022-03-11 MED ORDER — POTASSIUM CHLORIDE 10 MEQ/100ML IV SOLN
10.0000 meq | INTRAVENOUS | Status: AC
  Filled 2022-03-11 (×2): qty 100

## 2022-03-11 MED ORDER — ENSURE PRE-SURGERY PO LIQD
296.0000 mL | Freq: Once | ORAL | Status: DC
  Filled 2022-03-11: qty 296

## 2022-03-11 MED ORDER — ENSURE PRE-SURGERY PO LIQD
592.0000 mL | Freq: Once | ORAL | Status: DC
  Filled 2022-03-11: qty 592

## 2022-03-11 MED ORDER — MAGIC MOUTHWASH: 15.0000 mL | Freq: Four times a day (QID) | ORAL | Status: DC | PRN

## 2022-03-11 MED ORDER — BUPIVACAINE LIPOSOME 1.3 % IJ SUSP: 20.0000 mL | INTRAMUSCULAR | Status: AC

## 2022-03-11 MED ORDER — POTASSIUM CHLORIDE 10 MEQ/100ML IV SOLN
10.0000 meq | INTRAVENOUS | Status: AC
  Administered 2022-03-11: 10 meq via INTRAVENOUS
  Filled 2022-03-11: qty 100

## 2022-03-11 MED ORDER — METHOCARBAMOL 1000 MG/10ML IJ SOLN: 1000.0000 mg | Freq: Four times a day (QID) | INTRAMUSCULAR | Status: DC | PRN

## 2022-03-11 MED ORDER — HYDROMORPHONE HCL 1 MG/ML IJ SOLN
0.5000 mg | INTRAMUSCULAR | Status: DC | PRN
  Filled 2022-03-11: qty 1

## 2022-03-11 MED ORDER — SIMETHICONE 40 MG/0.6ML PO SUSP
80.0000 mg | Freq: Four times a day (QID) | ORAL | Status: DC | PRN
Start: 2022-03-11 — End: 2022-03-20

## 2022-03-11 MED ORDER — LIP MEDEX EX OINT
1.0000 | TOPICAL_OINTMENT | Freq: Two times a day (BID) | CUTANEOUS | Status: DC
Start: 2022-03-11 — End: 2022-03-20
  Administered 2022-03-11 – 2022-03-20 (×17): 1 via TOPICAL
  Filled 2022-03-11 (×4): qty 7

## 2022-03-11 MED ORDER — PHENOL 1.4 % MT LIQD
2.0000 | OROMUCOSAL | Status: DC | PRN
Start: 2022-03-11 — End: 2022-03-20

## 2022-03-11 MED ORDER — GABAPENTIN 300 MG PO CAPS: 300.0000 mg | ORAL_CAPSULE | ORAL | Status: AC

## 2022-03-11 MED ORDER — SODIUM CHLORIDE 0.9 % IV SOLN
2.0000 g | INTRAVENOUS | Status: AC
  Administered 2022-03-12: 2 g via INTRAVENOUS
  Filled 2022-03-11: qty 2

## 2022-03-11 MED ORDER — NEOMYCIN SULFATE 500 MG PO TABS
1000.0000 mg | ORAL_TABLET | ORAL | Status: AC
  Administered 2022-03-11 (×2): 1000 mg via ORAL
  Filled 2022-03-11 (×4): qty 2

## 2022-03-11 MED ORDER — ALUM & MAG HYDROXIDE-SIMETH 200-200-20 MG/5ML PO SUSP
30.0000 mL | Freq: Four times a day (QID) | ORAL | Status: DC | PRN
  Administered 2022-03-12 – 2022-03-17 (×2): 30 mL via ORAL
  Filled 2022-03-11 (×2): qty 30

## 2022-03-11 MED ORDER — DIPHENHYDRAMINE HCL 50 MG/ML IJ SOLN
12.5000 mg | Freq: Four times a day (QID) | INTRAMUSCULAR | Status: DC | PRN
  Administered 2022-03-18: 25 mg via INTRAVENOUS
  Filled 2022-03-11: qty 1

## 2022-03-11 MED ORDER — LIDOCAINE-PRILOCAINE 2.5-2.5 % EX CREA
TOPICAL_CREAM | Freq: Once | CUTANEOUS | Status: AC | PRN
  Administered 2022-03-11: 1 via TOPICAL
  Filled 2022-03-11: qty 5

## 2022-03-11 MED ORDER — HYDROMORPHONE HCL 1 MG/ML IJ SOLN
1.0000 mg | Freq: Once | INTRAMUSCULAR | Status: DC
  Filled 2022-03-11: qty 1

## 2022-03-11 MED ORDER — PROCHLORPERAZINE EDISYLATE 10 MG/2ML IJ SOLN
5.0000 mg | INTRAMUSCULAR | Status: DC | PRN
  Administered 2022-03-11: 10 mg via INTRAVENOUS
  Filled 2022-03-11: qty 2

## 2022-03-11 MED ORDER — MORPHINE SULFATE (PF) 2 MG/ML IV SOLN
2.0000 mg | INTRAVENOUS | Status: DC | PRN
  Administered 2022-03-12: 2 mg via INTRAVENOUS
  Administered 2022-03-12 (×2): 4 mg via INTRAVENOUS
  Administered 2022-03-12 – 2022-03-13 (×4): 2 mg via INTRAVENOUS
  Filled 2022-03-11 (×4): qty 1
  Filled 2022-03-11: qty 2
  Filled 2022-03-11 (×2): qty 1
  Filled 2022-03-11: qty 2

## 2022-03-11 MED ORDER — SODIUM CHLORIDE 0.9 % IV SOLN
1000.0000 mL | INTRAVENOUS | Status: DC
  Administered 2022-03-11 – 2022-03-12 (×2): 1000 mL via INTRAVENOUS

## 2022-03-11 MED ORDER — POTASSIUM CHLORIDE 10 MEQ/100ML IV SOLN
10.0000 meq | INTRAVENOUS | Status: AC
  Administered 2022-03-11 (×3): 10 meq via INTRAVENOUS
  Filled 2022-03-11: qty 100

## 2022-03-11 NOTE — Consult Note (Signed)
Consult Note  Karen Kaufman [birth year]  [id number].    Requesting MD: Marye Round, MD Chief Complaint/Reason for Consult: large bowel obstruction secondary to colorectal cancer   HPI:  Patient is a 61 year old female recently diagnosed with colorectal cancer who presented to the ED today with complaint of worsening abdominal pain, nausea and no BM in several days. Patient was seen for rectal bleeding 02/08/22 and reported hx of worsening constipation over several months followed by bloody BMs. CT then showed circumferential wall thickening of the distal sigmoid colon and rectum concerning for rectal mass; enlarged perirectal and low left retroperitoneal lymph nodes concerning for nodal metastatic disease; and multiple ill-defined hypodense liver lesions concerning for hepatic metastases. Colonoscopy 4/24 showed a circumferential fungating almost completely obstructing medium-sized mass in the recto-sigmoid colon. Patient was clinically not obstructed at that time and having bowel function with use of laxatives. She was referred to oncology and Dr. Johney Maine with colorectal surgery. She was seen by Dr. Johney Maine in our office 5/9 and was still having some bowel function on a liquid diet with colace and miralax at that time. Plan was for neoadjuvant chemotherapy with plans for surgery down the line. Patient received chemotherapy 5/17. States that she has had gradually worsening abdominal pain, bloating, vomiting, and poor PO intake. CT scan today shows distal large bowel obstruction at the site of a large annular mass at the rectosigmoid junction. General surgery asked to see.  Prior abdominal surgery includes vaginal hysterectomy. PMH otherwise significant for HLD. Allergies listed to shellfish and iodine.    Family History  Problem Relation Age of Onset   Cancer Mother        breast   Diabetes Mother    Hyperlipidemia Mother    Hypertension Mother    Cancer Father        prostate   Arthritis  Maternal Grandmother    Arthritis Maternal Grandfather    Cancer Maternal Grandfather        hodgkins lymphoma   Arthritis Paternal Grandmother    Arthritis Paternal Grandfather    Diabetes Paternal Grandfather    Colon cancer Neg Hx    Pancreatic cancer Neg Hx    Esophageal cancer Neg Hx     Past Medical History:  Diagnosis Date   Allergy    Cancer ([place])    skin cancer removed by dermatology   Cataract    Hyperlipidemia     Past Surgical History:  Procedure Laterality Date   ABDOMINAL HYSTERECTOMY  2002   BOWEL DECOMPRESSION N/A 02/09/2022   Procedure: BOWEL DECOMPRESSION;  Surgeon: Ladene Artist, MD;  Location: [place];  Service: Gastroenterology;  Laterality: N/A;   [place]   craniotomy-removal of tumor from behind right eye   BREAST SURGERY  2003   breast implants   COLONOSCOPY WITH PROPOFOL N/A 02/09/2022   Procedure: COLONOSCOPY WITH PROPOFOL;  Surgeon: Ladene Artist, MD;  Location: [place];  Service: Gastroenterology;  Laterality: N/A;   EYE SURGERY     bilateral cataract removal   FLEXIBLE SIGMOIDOSCOPY N/A 02/09/2022   Procedure: FLEXIBLE SIGMOIDOSCOPY;  Surgeon: Ladene Artist, MD;  Location: [place];  Service: Gastroenterology;  Laterality: N/A;   IR IMAGING GUIDED PORT INSERTION  02/20/2022   IR US GUIDE BX ASP/DRAIN  02/20/2022   POLYPECTOMY  02/09/2022   Procedure: POLYPECTOMY;  Surgeon: Ladene Artist, MD;  Location: [place];  Service: Gastroenterology;;    Social History:  reports that she has never smoked. She has never used smokeless tobacco. She reports that she does not currently use alcohol. She reports that she does not currently use drugs.  Allergies:  Allergies  Allergen Reactions   Iodine Anaphylaxis   Shellfish Allergy Anaphylaxis    (Not in a hospital admission)   Blood pressure 137/82, pulse 80, temperature 98.1 F (36.7 C), temperature source Oral, resp. rate 17, SpO2 100 %. Physical Exam:   General: pleasant, WD, female who is laying in bed in NAD HEENT: head is normocephalic, atraumatic.  Sclera are noninjected.  Pupils equal and round.  Ears and nose without any masses or lesions.  Mouth is pink and moist Heart: regular, rate, and rhythm Lungs: CTAB, no wheezes, rhonchi, or rales noted.  Respiratory effort nonlabored on room air. Port present right chest Abd: distended but soft, diffuse tenderness without peritonitis, +BS, no masses, hernias, or organomegaly MS: all 4 extremities are symmetrical with no cyanosis, clubbing, or edema. Skin: warm and dry with no masses, lesions, or rashes Neuro: MAEs Psych: A&Ox3 with an appropriate affect.   Results for orders placed or performed during the hospital encounter of 03/11/22 (from the past 48 hour(s))  Lipase, blood     Status: None   Collection Time: 03/11/22 10:57 AM  Result Value Ref Range   Lipase 24 11 - 51 U/L    Comment: Performed at [facility], [place] [address] [place], [place] [address]  Comprehensive metabolic panel     Status: Abnormal   Collection Time: 03/11/22 10:57 AM  Result Value Ref Range   Sodium 133 (L) 135 - 145 mmol/L   Potassium 3.2 (L) 3.5 - 5.1 mmol/L   Chloride 94 (L) 98 - 111 mmol/L   CO2 27 22 - 32 mmol/L   Glucose, Bld 102 (H) 70 - 99 mg/dL    Comment: Glucose reference range applies only to samples taken after fasting for at least 8 hours.   BUN 9 8 - 23 mg/dL   Creatinine, Ser 0.63 0.44 - 1.00 mg/dL   Calcium 8.9 8.9 - 10.3 mg/dL   Total Protein 6.9 6.5 - 8.1 g/dL   Albumin 3.7 3.5 - 5.0 g/dL   AST 29 15 - 41 U/L   ALT 28 0 - 44 U/L   Alkaline Phosphatase 121 38 - 126 U/L   Total Bilirubin 0.9 0.3 - 1.2 mg/dL   GFR, Estimated >60 >60 mL/min    Comment: (NOTE) Calculated using the CKD-EPI Creatinine Equation (2021)    Anion gap 12 5 - 15    Comment: Performed at [facility], [place] [address] [place], [place] [address]  CBC     Status: Abnormal    Collection Time: 03/11/22 10:57 AM  Result Value Ref Range   WBC 3.2 (L) 4.0 - 10.5 K/uL   RBC 4.56 3.87 - 5.11 MIL/uL   Hemoglobin 14.0 12.0 - 15.0 g/dL   HCT 40.1 36.0 - 46.0 %   MCV 87.9 80.0 - 100.0 fL   MCH 30.7 26.0 - 34.0 pg   MCHC 34.9 30.0 - 36.0 g/dL   RDW 11.4 (L) 11.5 - 15.5 %   Platelets 206 150 - 400 K/uL   nRBC 0.0 0.0 - 0.2 %    Comment: Performed at [facility], [place] [address] [place],  [address]  Urinalysis, Routine w reflex microscopic Urine, Clean Catch     Status: Abnormal   Collection Time: 03/11/22 11:16  AM  Result Value Ref Range   Color, Urine YELLOW YELLOW   APPearance CLEAR CLEAR   Specific Gravity, Urine 1.020 1.005 - 1.030   pH 5.0 5.0 - 8.0   Glucose, UA NEGATIVE NEGATIVE mg/dL   Hgb urine dipstick NEGATIVE NEGATIVE   Bilirubin Urine NEGATIVE NEGATIVE   Ketones, ur 80 (A) NEGATIVE mg/dL   Protein, ur NEGATIVE NEGATIVE mg/dL   Nitrite NEGATIVE NEGATIVE   Leukocytes,Ua NEGATIVE NEGATIVE   RBC / HPF 0-5 0 - 5 RBC/hpf   WBC, UA 0-5 0 - 5 WBC/hpf   Bacteria, UA NONE SEEN NONE SEEN   Squamous Epithelial / LPF 0-5 0 - 5   Mucus PRESENT     Comment: Performed at [facility], [place] [address], [place], [place] [address]   CT ABDOMEN PELVIS WO CONTRAST  Result Date: 03/11/2022 CLINICAL DATA:  Abdominal pain and bloating for several days. No bowel movement for 4 days. History of metastatic rectal cancer. * Tracking Code: BO * EXAM: CT ABDOMEN AND PELVIS WITHOUT CONTRAST TECHNIQUE: Multidetector CT imaging of the abdomen and pelvis was performed following the standard protocol without IV contrast. RADIATION DOSE REDUCTION: This exam was performed according to the departmental dose-optimization program which includes automated exposure control, adjustment of the mA and/or kV according to patient size and/or use of iterative reconstruction technique. COMPARISON:  03/01/2022 MRI abdomen.  02/08/2022 CT abdomen/pelvis.  FINDINGS: Lower chest: No acute abnormality at the lung bases. Mildly prominent 0.8 cm right pericardiophrenic lymph node (series 2/image 10), increased from 0.6 cm on 02/08/2022 CT. Partially visualized bilateral breast prostheses. Hepatobiliary: Several (greater than 5) ill-defined hypodense liver masses scattered throughout the liver, appearing mildly increased since 02/08/2022 CT. Representative 2.4 cm posterior right liver mass (series 2/image 17), increased from 1.8 cm. Central right liver 1.5 cm mass (series 2/image 12), mildly increased from 1.2 cm. Anterior left liver 1.3 cm mass (series 2/image 13), increased from 1.1 cm. Normal gallbladder with no radiopaque cholelithiasis. No biliary ductal dilatation. Pancreas: Normal, with no mass or duct dilation. Spleen: Normal size. No mass. Adrenals/Urinary Tract: Normal adrenals. No renal stones. No hydronephrosis. Simple 1.4 cm medial interpolar right renal cyst, for which no follow-up is recommended. Otherwise no contour deforming renal masses. Normal bladder. Stomach/Bowel: Normal non-distended stomach. No dilated small bowel loops. Scattered air-fluid levels in the distal small bowel. No small bowel wall thickening. Mildly dilated fluid-filled appendix without appendiceal wall thickening or significant periappendiceal fat stranding. Annular wall thickening at the rectosigmoid junction involving an approximately 8 cm in length segment, proximal to which the colon is prominently distended with widespread colonic air-fluid levels. Cecal diameter 8.8 cm. Vascular/Lymphatic: Normal caliber abdominal aorta. Left para-lymphadenopathy up to 1.0 cm (series 2/image 36), increased from 0.7 cm. Multiple enlarged left perirectal nodes, largest 1.6 cm short axis diameter (series 2/image 65), stable. Reproductive: Status post hysterectomy, with no abnormal findings at the vaginal cuff. No adnexal mass. Other: No pneumoperitoneum. Small volume pelvic ascites. Presacral space  ill-defined fluid and perirectal fat stranding is mildly increased. No focal fluid collection. Musculoskeletal: Moderate L1 vertebral compression fracture is new since 02/08/2022 CT, without definite discrete osseous lesions. Mild thoracolumbar spondylosis. IMPRESSION: 1. Distal large bowel obstruction at the site of a large annular mass at the rectosigmoid junction. 2. Progressive liver metastases. 3. Progressive perirectal, left para-aortic and right pericardiophrenic nodal metastases. 4. Small volume pelvic ascites. 5. Moderate L1 vertebral compression fracture, new since 02/08/2022 CT, without definite underlying osseous lesion. Electronically  Signed   By: Ilona Sorrel M.D.   On: 03/11/2022 13:38      Assessment/Plan Large bowel obstruction secondary to Metastatic colorectal cancer with liver mets and LN mets - CT today with distal large bowel obstruction at the site of large annular mass at rectosigmoid junction, progressive liver mets, progressive nodal mets, small volume pelvic ascites - s/p first chemo on 03/04/22 - Patient is now fully obstructed and will need to be diverted. Plan for laparoscopic assisted diverting loop sigmoid colostomy tomorrow. I will send a message to her oncologist so they are aware she is here.  FEN: IVF, sips, NPO after midnight VTE: SCDs, ok for chemical DVT prophylaxis from surgical standpoint ID: none  HLD  Margie Billet, [facility] Surgery 03/11/2022, 2:33 PM Please see Amion for pager number during day hours 7:00am-4:30pm

## 2022-03-11 NOTE — ED Provider Notes (Signed)
[facility] DEPT Provider Note   CSN: [id number] Arrival date & time: 03/11/22  1007     History  Chief Complaint  Patient presents with   Abdominal Pain    Karen Kaufman is a 61 y.o. female.   Abdominal Pain Associated symptoms: no fever    Patient has a history of recently diagnosed metastatic colorectal cancer.  Patient had a CT scan on April 23 that showed a circumferential wall thickening around her sigmoid colon and rectum concerning for rectal cancer.  She ended up having MRI that showed evidence of liver metastases.  Patient has been evaluated by Dr. Johney Maine general surgery and the oncology department.  Patient states the plan was for her to receive chemotherapy initially and then eventual plan for surgery.  Patient had a chemotherapy infusion on May 17.  Patient's had progressive issues with abdominal pain and vomiting.  She feels increased abdominal swelling.  She has not been able to have a bowel movement. Patient spoke with her surgeon and they suggested she come to the ED for evaluation today Home Medications Prior to Admission medications   Medication Sig Start Date End Date Taking? Authorizing Provider  atorvastatin (LIPITOR) 20 MG tablet Take 1 tablet (20 mg total) by mouth daily. 01/26/22   Gwenlyn Perking, FNP  dicyclomine (BENTYL) 10 MG capsule Take 2 capsules (20 mg total) by mouth every 6 (six) hours as needed for spasms (cramping). 02/10/22   Caren Griffins, MD  docusate sodium (COLACE) 100 MG capsule Take 200 mg by mouth daily.    [provider]  HYDROcodone-acetaminophen (NORCO) 5-325 MG tablet Take 1-2 tablets by mouth every 6 (six) hours as needed for moderate pain. 02/27/22   Owens Shark, NP  lidocaine-prilocaine (EMLA) cream Apply to port site 1-2 hours prior to use 02/27/22   Owens Shark, NP  magnesium oxide (MAG-OX) 400 MG tablet Take 400 mg by mouth daily.    [provider]  ondansetron (ZOFRAN) 8 MG  tablet Take 1 tablet (8 mg total) by mouth every 8 (eight) hours as needed for nausea or vomiting. Do not take until 72 hours after chemo 02/27/22   Owens Shark, NP  polyethylene glycol (MIRALAX / GLYCOLAX) 17 g packet Take 17 g by mouth 2 (two) times daily. 02/10/22 03/12/22  Caren Griffins, MD  prochlorperazine (COMPAZINE) 10 MG tablet Take 1 tablet (10 mg total) by mouth every 6 (six) hours as needed for nausea or vomiting. 02/27/22   Owens Shark, NP  vitamin B-12 (CYANOCOBALAMIN) 1000 MCG tablet Take 1 tablet (1,000 mcg total) by mouth daily. 01/27/22   Gwenlyn Perking, FNP  Vitamin D, Ergocalciferol, (DRISDOL) 1.25 MG (50000 UNIT) CAPS capsule Take 1 capsule (50,000 Units total) by mouth every 7 (seven) days. Patient not taking: Reported on 02/08/2022 01/27/22   Gwenlyn Perking, FNP      Allergies    Iodine and Shellfish allergy    Review of Systems   Review of Systems  Constitutional:  Negative for fever.  Gastrointestinal:  Positive for abdominal pain.   Physical Exam Updated Vital Signs BP 137/82   Pulse 80   Temp 98.1 F (36.7 C) (Oral)   Resp 17   SpO2 100%  Physical Exam Vitals and nursing note reviewed.  Constitutional:      Appearance: She is well-developed. She is ill-appearing.  HENT:     Head: Normocephalic and atraumatic.     Right Ear: External  ear normal.     Left Ear: External ear normal.  Eyes:     General: No scleral icterus.       Right eye: No discharge.        Left eye: No discharge.     Conjunctiva/sclera: Conjunctivae normal.  Neck:     Trachea: No tracheal deviation.  Cardiovascular:     Rate and Rhythm: Normal rate and regular rhythm.  Pulmonary:     Effort: Pulmonary effort is normal. No respiratory distress.     Breath sounds: Normal breath sounds. No stridor. No wheezing or rales.  Abdominal:     General: Bowel sounds are normal. There is no distension.     Palpations: Abdomen is soft.     Tenderness: There is generalized abdominal  tenderness. There is no guarding or rebound.  Musculoskeletal:        General: No tenderness or deformity.     Cervical back: Neck supple.  Skin:    General: Skin is warm and dry.     Findings: No rash.  Neurological:     General: No focal deficit present.     Mental Status: She is alert.     Cranial Nerves: No cranial nerve deficit (no facial droop, extraocular movements intact, no slurred speech).     Sensory: No sensory deficit.     Motor: No abnormal muscle tone or seizure activity.     Coordination: Coordination normal.  Psychiatric:        Mood and Affect: Mood normal.    ED Results / Procedures / Treatments   Labs (all labs ordered are listed, but only abnormal results are displayed) Labs Reviewed  COMPREHENSIVE METABOLIC PANEL - Abnormal; Notable for the following components:      Result Value   Sodium 133 (*)    Potassium 3.2 (*)    Chloride 94 (*)    Glucose, Bld 102 (*)    All other components within normal limits  CBC - Abnormal; Notable for the following components:   WBC 3.2 (*)    RDW 11.4 (*)    All other components within normal limits  URINALYSIS, ROUTINE W REFLEX MICROSCOPIC - Abnormal; Notable for the following components:   Ketones, ur 80 (*)    All other components within normal limits  LIPASE, BLOOD  HEMOGLOBIN A1C    EKG None  Radiology CT ABDOMEN PELVIS WO CONTRAST  Result Date: 03/11/2022 CLINICAL DATA:  Abdominal pain and bloating for several days. No bowel movement for 4 days. History of metastatic rectal cancer. * Tracking Code: BO * EXAM: CT ABDOMEN AND PELVIS WITHOUT CONTRAST TECHNIQUE: Multidetector CT imaging of the abdomen and pelvis was performed following the standard protocol without IV contrast. RADIATION DOSE REDUCTION: This exam was performed according to the departmental dose-optimization program which includes automated exposure control, adjustment of the mA and/or kV according to patient size and/or use of iterative  reconstruction technique. COMPARISON:  03/01/2022 MRI abdomen.  02/08/2022 CT abdomen/pelvis. FINDINGS: Lower chest: No acute abnormality at the lung bases. Mildly prominent 0.8 cm right pericardiophrenic lymph node (series 2/image 10), increased from 0.6 cm on 02/08/2022 CT. Partially visualized bilateral breast prostheses. Hepatobiliary: Several (greater than 5) ill-defined hypodense liver masses scattered throughout the liver, appearing mildly increased since 02/08/2022 CT. Representative 2.4 cm posterior right liver mass (series 2/image 17), increased from 1.8 cm. Central right liver 1.5 cm mass (series 2/image 12), mildly increased from 1.2 cm. Anterior left liver 1.3 cm mass (series 2/image  13), increased from 1.1 cm. Normal gallbladder with no radiopaque cholelithiasis. No biliary ductal dilatation. Pancreas: Normal, with no mass or duct dilation. Spleen: Normal size. No mass. Adrenals/Urinary Tract: Normal adrenals. No renal stones. No hydronephrosis. Simple 1.4 cm medial interpolar right renal cyst, for which no follow-up is recommended. Otherwise no contour deforming renal masses. Normal bladder. Stomach/Bowel: Normal non-distended stomach. No dilated small bowel loops. Scattered air-fluid levels in the distal small bowel. No small bowel wall thickening. Mildly dilated fluid-filled appendix without appendiceal wall thickening or significant periappendiceal fat stranding. Annular wall thickening at the rectosigmoid junction involving an approximately 8 cm in length segment, proximal to which the colon is prominently distended with widespread colonic air-fluid levels. Cecal diameter 8.8 cm. Vascular/Lymphatic: Normal caliber abdominal aorta. Left para-lymphadenopathy up to 1.0 cm (series 2/image 36), increased from 0.7 cm. Multiple enlarged left perirectal nodes, largest 1.6 cm short axis diameter (series 2/image 65), stable. Reproductive: Status post hysterectomy, with no abnormal findings at the vaginal  cuff. No adnexal mass. Other: No pneumoperitoneum. Small volume pelvic ascites. Presacral space ill-defined fluid and perirectal fat stranding is mildly increased. No focal fluid collection. Musculoskeletal: Moderate L1 vertebral compression fracture is new since 02/08/2022 CT, without definite discrete osseous lesions. Mild thoracolumbar spondylosis. IMPRESSION: 1. Distal large bowel obstruction at the site of a large annular mass at the rectosigmoid junction. 2. Progressive liver metastases. 3. Progressive perirectal, left para-aortic and right pericardiophrenic nodal metastases. 4. Small volume pelvic ascites. 5. Moderate L1 vertebral compression fracture, new since 02/08/2022 CT, without definite underlying osseous lesion. Electronically Signed   By: Ilona Sorrel M.D.   On: 03/11/2022 13:38    Procedures Procedures    Medications Ordered in ED Medications  sodium chloride 0.9 % bolus 1,000 mL (1,000 mLs Intravenous New Bag/Given 03/11/22 1157)    Followed by  0.9 %  sodium chloride infusion (has no administration in time range)  potassium chloride 10 mEq in 100 mL IVPB (10 mEq Intravenous New Bag/Given 03/11/22 1305)  morphine (PF) 2 MG/ML injection 2-4 mg (has no administration in time range)  methocarbamol (ROBAXIN) 500 mg in dextrose 5 % 50 mL IVPB (has no administration in time range)  enoxaparin (LOVENOX) injection 40 mg (has no administration in time range)  feeding supplement (ENSURE PRE-SURGERY) liquid 592 mL (has no administration in time range)  feeding supplement (ENSURE PRE-SURGERY) liquid 296 mL (has no administration in time range)  cefoTEtan (CEFOTAN) 2 g in sodium chloride 0.9 % 100 mL IVPB (has no administration in time range)  neomycin (MYCIFRADIN) tablet 1,000 mg (has no administration in time range)    And  metroNIDAZOLE (FLAGYL) tablet 1,000 mg (has no administration in time range)  acetaminophen (TYLENOL) tablet 1,000 mg (has no administration in time range)  bupivacaine  liposome (EXPAREL) 1.3 % injection 266 mg (has no administration in time range)  gabapentin (NEURONTIN) capsule 300 mg (has no administration in time range)  alvimopan (ENTEREG) capsule 12 mg (has no administration in time range)  celecoxib (CELEBREX) capsule 200 mg (has no administration in time range)  lactated ringers bolus 1,000 mL (has no administration in time range)  HYDROmorphone (DILAUDID) injection 0.5-2 mg (has no administration in time range)  methocarbamol (ROBAXIN) 1,000 mg in dextrose 5 % 100 mL IVPB (has no administration in time range)  ondansetron (ZOFRAN) injection 4 mg (has no administration in time range)    Or  ondansetron (ZOFRAN) 8 mg in sodium chloride 0.9 % 50 mL IVPB (has no  administration in time range)  prochlorperazine (COMPAZINE) injection 5-10 mg (has no administration in time range)  lip balm (CARMEX) ointment 1 application. (has no administration in time range)  phenol (CHLORASEPTIC) mouth spray 2 spray (has no administration in time range)  menthol-cetylpyridinium (CEPACOL) lozenge 3 mg (has no administration in time range)  magic mouthwash (has no administration in time range)  alum & mag hydroxide-simeth (MAALOX/MYLANTA) 200-200-20 MG/5ML suspension 30 mL (has no administration in time range)  simethicone (MYLICON) 40 PX/1.0GY suspension 80 mg (has no administration in time range)  diphenhydrAMINE (BENADRYL) injection 12.5-25 mg (has no administration in time range)  ondansetron (ZOFRAN) injection 4 mg (4 mg Intravenous Given 03/11/22 1222)  morphine (PF) 4 MG/ML injection 4 mg (4 mg Intravenous Given 03/11/22 1223)  morphine (PF) 4 MG/ML injection 4 mg (4 mg Intravenous Given 03/11/22 1423)    ED Course/ Medical Decision Making/ A&P Clinical Course as of 03/11/22 1535  Wed Mar 11, 2022  1152 CBC(!) White blood cell count decreased compared to previous [JK]  1152 Comprehensive metabolic panel(!) Mild hypokalemia kalemia and hyponatremia noted [JK]  1353  CT ABDOMEN PELVIS WO CONTRAST CT scan shows evidence of bowel obstruction at the site of her rectal mass.  Images and radiology report reviewed [IR]  4854 Case discussed with Jerene Pitch,  general surgery  [JK]  1437 Case discussed with Dr Marylyn Ishihara [JK]  1535 Case discussed with Dr Lorenso Courier [JK]    Clinical Course User Index [JK] Dorie Rank, MD                           Medical Decision Making Problems Addressed: Colorectal cancer [facility]): chronic illness or injury with exacerbation, progression, or side effects of treatment Hypokalemia: acute illness or injury Other complete intestinal obstruction [facility]): acute illness or injury that poses a threat to life or bodily functions  Amount and/or Complexity of Data Reviewed Labs: ordered. Decision-making details documented in ED Course. Radiology: ordered. Decision-making details documented in ED Course.  Risk Prescription drug management. Decision regarding hospitalization.   Patient presented to the ED with complaints of vomiting, increasing abdominal pain abdominal bloating.  Patient has not had a bowel movement recently.  Patient has known colorectal cancer.  She has started chemotherapy but has not had any surgical intervention yet due to the size of the lesion.  Unfortunately patient presented with symptoms concerning for possible obstruction.  CT scan was performed and it does show evidence of bowel obstruction associated with her colorectal cancer.  Concerned the patient may need diverting colostomy procedure.  Consulted with general surgery and they will see the patient.  They requested consultation with the medical service for admission and oncology.  Patient will be admitted to the hospital for further treatment        Final Clinical Impression(s) / ED Diagnoses Final diagnoses:  Other complete intestinal obstruction ([place])  Colorectal cancer ([place])  Hypokalemia     Dorie Rank, MD 03/11/22 1535

## 2022-03-11 NOTE — H&P (Signed)
History and Physical    Patient: Karen Kaufman BJY:[id number] DOB: August 19, 1961 DOA: 03/11/2022 DOS: the patient was seen and examined on 03/11/2022 PCP: Gwenlyn Perking, FNP  Patient coming from: Home  Chief Complaint:  Chief Complaint  Patient presents with   Abdominal Pain   HPI: Karen Kaufman is a 61 y.o. female with medical history significant of colorectal cancer, HLD. Presenting with abdominal pain and bloating. She had a fairly recent diagnosis of colorectal cancer. She started chemo last week. She reports that over the last 4 days, she has had increased bloating. She's had N/V w/ both solids and liquids. She reports that her last BM for 4 days ago. She has tried GasX, miralax, and colace, but nothing has helped. When her symptoms didn't resolve this morning, she decided to come to the ED for evaluation. She denies any other aggravating or alleviating factors.   Review of Systems: As mentioned in the history of present illness. All other systems reviewed and are negative. Past Medical History:  Diagnosis Date   Allergy    Cancer ([place])    skin cancer removed by dermatology   Cataract    Hyperlipidemia    Past Surgical History:  Procedure Laterality Date   ABDOMINAL HYSTERECTOMY  2002   BOWEL DECOMPRESSION N/A 02/09/2022   Procedure: BOWEL DECOMPRESSION;  Surgeon: Ladene Artist, MD;  Location: [place];  Service: Gastroenterology;  Laterality: N/A;   [place]   craniotomy-removal of tumor from behind right eye   BREAST SURGERY  2003   breast implants   COLONOSCOPY WITH PROPOFOL N/A 02/09/2022   Procedure: COLONOSCOPY WITH PROPOFOL;  Surgeon: Ladene Artist, MD;  Location: [place];  Service: Gastroenterology;  Laterality: N/A;   EYE SURGERY     bilateral cataract removal   FLEXIBLE SIGMOIDOSCOPY N/A 02/09/2022   Procedure: FLEXIBLE SIGMOIDOSCOPY;  Surgeon: Ladene Artist, MD;  Location: [place];  Service: Gastroenterology;  Laterality: N/A;   IR  IMAGING GUIDED PORT INSERTION  02/20/2022   IR US GUIDE BX ASP/DRAIN  02/20/2022   POLYPECTOMY  02/09/2022   Procedure: POLYPECTOMY;  Surgeon: Ladene Artist, MD;  Location: [place];  Service: Gastroenterology;;   Social History:  reports that she has never smoked. She has never used smokeless tobacco. She reports that she does not currently use alcohol. She reports that she does not currently use drugs.  Allergies  Allergen Reactions   Iodine Anaphylaxis   Shellfish Allergy Anaphylaxis    Family History  Problem Relation Age of Onset   Cancer Mother        breast   Diabetes Mother    Hyperlipidemia Mother    Hypertension Mother    Cancer Father        prostate   Arthritis Maternal Grandmother    Arthritis Maternal Grandfather    Cancer Maternal Grandfather        hodgkins lymphoma   Arthritis Paternal Grandmother    Arthritis Paternal Grandfather    Diabetes Paternal Grandfather    Colon cancer Neg Hx    Pancreatic cancer Neg Hx    Esophageal cancer Neg Hx     Prior to Admission medications   Medication Sig Start Date End Date Taking? Authorizing Provider  atorvastatin (LIPITOR) 20 MG tablet Take 1 tablet (20 mg total) by mouth daily. 01/26/22   Gwenlyn Perking, FNP  dicyclomine (BENTYL) 10 MG capsule Take 2 capsules (20 mg total) by mouth every 6 (six) hours as needed  for spasms (cramping). 02/10/22   Caren Griffins, MD  docusate sodium (COLACE) 100 MG capsule Take 200 mg by mouth daily.    [provider]  HYDROcodone-acetaminophen (NORCO) 5-325 MG tablet Take 1-2 tablets by mouth every 6 (six) hours as needed for moderate pain. 02/27/22   Owens Shark, NP  lidocaine-prilocaine (EMLA) cream Apply to port site 1-2 hours prior to use 02/27/22   Owens Shark, NP  magnesium oxide (MAG-OX) 400 MG tablet Take 400 mg by mouth daily.    [provider]  ondansetron (ZOFRAN) 8 MG tablet Take 1 tablet (8 mg total) by mouth every 8 (eight) hours as needed  for nausea or vomiting. Do not take until 72 hours after chemo 02/27/22   Owens Shark, NP  polyethylene glycol (MIRALAX / GLYCOLAX) 17 g packet Take 17 g by mouth 2 (two) times daily. 02/10/22 03/12/22  Caren Griffins, MD  prochlorperazine (COMPAZINE) 10 MG tablet Take 1 tablet (10 mg total) by mouth every 6 (six) hours as needed for nausea or vomiting. 02/27/22   Owens Shark, NP  vitamin B-12 (CYANOCOBALAMIN) 1000 MCG tablet Take 1 tablet (1,000 mcg total) by mouth daily. 01/27/22   Gwenlyn Perking, FNP  Vitamin D, Ergocalciferol, (DRISDOL) 1.25 MG (50000 UNIT) CAPS capsule Take 1 capsule (50,000 Units total) by mouth every 7 (seven) days. Patient not taking: Reported on 02/08/2022 01/27/22   Gwenlyn Perking, FNP    Physical Exam: Vitals:   03/11/22 1223 03/11/22 1230 03/11/22 1330 03/11/22 1400  BP:  139/90 125/77 137/82  Pulse: 79 87 86 80  Resp:  '17 17 17  '$ Temp:      TempSrc:      SpO2: 97% 97% 96% 100%   General: 61 y.o. female resting in bed in NAD Eyes: PERRL, normal sclera ENMT: Nares patent w/o discharge, orophaynx clear, dentition normal, ears w/o discharge/lesions/ulcers Neck: Supple, trachea midline Cardiovascular: RRR, +S1, S2, no m/g/r, equal pulses throughout Respiratory: CTABL, no w/r/r, normal WOB GI: BS+, distended, globally tender, no masses noted, no organomegaly noted MSK: No e/c/c Neuro: A&O x 3, no focal deficits Psyc: Appropriate interaction and affect, calm/cooperative  Data Reviewed:  Na+  133 K+ 3.2 WBC  3.2  CT ab/pevis w/o IMPRESSION: 1. Distal large bowel obstruction at the site of a large annular mass at the rectosigmoid junction. 2. Progressive liver metastases. 3. Progressive perirectal, left para-aortic and right pericardiophrenic nodal metastases. 4. Small volume pelvic ascites. 5. Moderate L1 vertebral compression fracture, new since 02/08/2022 CT, without definite underlying osseous lesion.  Assessment and Plan: Colonic  obstruction secondary to colorectal cancer     - admit to inpt, tele     - CCS onboard, taking to OR tomorrow for diverting colostomy     - NPO     - pain control, anti-emetics, fluids     - onco notified by EDP  L1 vertebral compression fracture     - as per CT above     - no recent falls noted     - no neurological deficits     - lets get her colon taken care of first, we have PT and neuro look at this after  Hypokalemia Hyponatremia     - fluids     - replace K+, check Mg2+  HLD     - resume home regimen when off NPO status  Advance Care Planning:   Code Status: FULL  Consults: General Surgery, Onco  Family Communication: w/ husband at bedside  Severity of Illness: The appropriate patient status for this patient is INPATIENT. Inpatient status is judged to be reasonable and necessary in order to provide the required intensity of service to ensure the patient's safety. The patient's presenting symptoms, physical exam findings, and initial radiographic and laboratory data in the context of their chronic comorbidities is felt to place them at high risk for further clinical deterioration. Furthermore, it is not anticipated that the patient will be medically stable for discharge from the hospital within 2 midnights of admission.   * I certify that at the point of admission it is my clinical judgment that the patient will require inpatient hospital care spanning beyond 2 midnights from the point of admission due to high intensity of service, high risk for further deterioration and high frequency of surveillance required.*  Author: Jonnie Finner, DO 03/11/2022 3:03 PM  For on call review [email or web address].

## 2022-03-11 NOTE — Progress Notes (Signed)
PATIENT NAVIGATOR PROGRESS NOTE  Name: Karen Kaufman Date: 03/11/2022 MRN: [id number]  DOB: Mar 28, 1961   Reason for visit:  Pt call  Comments:  Received call from patient that she is experiencing severe abdominal pain from upper abdomen to lower, that her abdomen is hard and she is now experiencing n/v anytime she tries to drink maalox for reflux symptoms.. She also states that she has not had a BM in a few days and that she is not eating anything.  States that she is going to [facility]  for evaluation.  Will continue to follow    Time spent counseling/coordinating care: 15-30 minutes

## 2022-03-11 NOTE — ED Triage Notes (Signed)
Pt reports increased abdominal pain and bloating over the past few days. Pt reports not having a BM in 4 days. Pt had chemo treatment last week and has had some N/V that she attributes to chemo. Hx of colorectal and liver cancer.

## 2022-03-11 NOTE — H&P (View-Only) (Signed)
Consult Note  Karen Kaufman 10-23-1960  [id number].    Requesting MD: Marye Round, MD Chief Complaint/Reason for Consult: large bowel obstruction secondary to colorectal cancer   HPI:  Patient is a 61 year old female recently diagnosed with colorectal cancer who presented to the ED today with complaint of worsening abdominal pain, nausea and no BM in several days. Patient was seen for rectal bleeding 02/08/22 and reported hx of worsening constipation over several months followed by bloody BMs. CT then showed circumferential wall thickening of the distal sigmoid colon and rectum concerning for rectal mass; enlarged perirectal and low left retroperitoneal lymph nodes concerning for nodal metastatic disease; and multiple ill-defined hypodense liver lesions concerning for hepatic metastases. Colonoscopy 4/24 showed a circumferential fungating almost completely obstructing medium-sized mass in the recto-sigmoid colon. Patient was clinically not obstructed at that time and having bowel function with use of laxatives. She was referred to oncology and Dr. Johney Maine with colorectal surgery. She was seen by Dr. Johney Maine in our office 5/9 and was still having some bowel function on a liquid diet with colace and miralax at that time. Plan was for neoadjuvant chemotherapy with plans for surgery down the line. Patient received chemotherapy 5/17. States that she has had gradually worsening abdominal pain, bloating, vomiting, and poor PO intake. CT scan today shows distal large bowel obstruction at the site of a large annular mass at the rectosigmoid junction. General surgery asked to see.  Prior abdominal surgery includes vaginal hysterectomy. PMH otherwise significant for HLD. Allergies listed to shellfish and iodine.    Family History  Problem Relation Age of Onset   Cancer Mother        breast   Diabetes Mother    Hyperlipidemia Mother    Hypertension Mother    Cancer Father        prostate   Arthritis  Maternal Grandmother    Arthritis Maternal Grandfather    Cancer Maternal Grandfather        hodgkins lymphoma   Arthritis Paternal Grandmother    Arthritis Paternal Grandfather    Diabetes Paternal Grandfather    Colon cancer Neg Hx    Pancreatic cancer Neg Hx    Esophageal cancer Neg Hx     Past Medical History:  Diagnosis Date   Allergy    Cancer ([place])    skin cancer removed by dermatology   Cataract    Hyperlipidemia     Past Surgical History:  Procedure Laterality Date   ABDOMINAL HYSTERECTOMY  2002   BOWEL DECOMPRESSION N/A 02/09/2022   Procedure: BOWEL DECOMPRESSION;  Surgeon: Ladene Artist, MD;  Location: [place];  Service: Gastroenterology;  Laterality: N/A;   [place]   craniotomy-removal of tumor from behind right eye   BREAST SURGERY  2003   breast implants   COLONOSCOPY WITH PROPOFOL N/A 02/09/2022   Procedure: COLONOSCOPY WITH PROPOFOL;  Surgeon: Ladene Artist, MD;  Location: [place];  Service: Gastroenterology;  Laterality: N/A;   EYE SURGERY     bilateral cataract removal   FLEXIBLE SIGMOIDOSCOPY N/A 02/09/2022   Procedure: FLEXIBLE SIGMOIDOSCOPY;  Surgeon: Ladene Artist, MD;  Location: [place];  Service: Gastroenterology;  Laterality: N/A;   IR IMAGING GUIDED PORT INSERTION  02/20/2022   IR US GUIDE BX ASP/DRAIN  02/20/2022   POLYPECTOMY  02/09/2022   Procedure: POLYPECTOMY;  Surgeon: Ladene Artist, MD;  Location: [place];  Service: Gastroenterology;;    Social History:  reports that she has never smoked. She has never used smokeless tobacco. She reports that she does not currently use alcohol. She reports that she does not currently use drugs.  Allergies:  Allergies  Allergen Reactions   Iodine Anaphylaxis   Shellfish Allergy Anaphylaxis    (Not in a hospital admission)   Blood pressure 137/82, pulse 80, temperature 98.1 F (36.7 C), temperature source Oral, resp. rate 17, SpO2 100 %. Physical Exam:   General: pleasant, WD, female who is laying in bed in NAD HEENT: head is normocephalic, atraumatic.  Sclera are noninjected.  Pupils equal and round.  Ears and nose without any masses or lesions.  Mouth is pink and moist Heart: regular, rate, and rhythm Lungs: CTAB, no wheezes, rhonchi, or rales noted.  Respiratory effort nonlabored on room air. Port present right chest Abd: distended but soft, diffuse tenderness without peritonitis, +BS, no masses, hernias, or organomegaly MS: all 4 extremities are symmetrical with no cyanosis, clubbing, or edema. Skin: warm and dry with no masses, lesions, or rashes Neuro: MAEs Psych: A&Ox3 with an appropriate affect.   Results for orders placed or performed during the hospital encounter of 03/11/22 (from the past 48 hour(s))  Lipase, blood     Status: None   Collection Time: 03/11/22 10:57 AM  Result Value Ref Range   Lipase 24 11 - 51 U/L    Comment: Performed at [facility], [place] [address] [place], [place] [address]  Comprehensive metabolic panel     Status: Abnormal   Collection Time: 03/11/22 10:57 AM  Result Value Ref Range   Sodium 133 (L) 135 - 145 mmol/L   Potassium 3.2 (L) 3.5 - 5.1 mmol/L   Chloride 94 (L) 98 - 111 mmol/L   CO2 27 22 - 32 mmol/L   Glucose, Bld 102 (H) 70 - 99 mg/dL    Comment: Glucose reference range applies only to samples taken after fasting for at least 8 hours.   BUN 9 8 - 23 mg/dL   Creatinine, Ser 0.63 0.44 - 1.00 mg/dL   Calcium 8.9 8.9 - 10.3 mg/dL   Total Protein 6.9 6.5 - 8.1 g/dL   Albumin 3.7 3.5 - 5.0 g/dL   AST 29 15 - 41 U/L   ALT 28 0 - 44 U/L   Alkaline Phosphatase 121 38 - 126 U/L   Total Bilirubin 0.9 0.3 - 1.2 mg/dL   GFR, Estimated >60 >60 mL/min    Comment: (NOTE) Calculated using the CKD-EPI Creatinine Equation (2021)    Anion gap 12 5 - 15    Comment: Performed at [facility], [place] [address] [place], [place] [address]  CBC     Status: Abnormal    Collection Time: 03/11/22 10:57 AM  Result Value Ref Range   WBC 3.2 (L) 4.0 - 10.5 K/uL   RBC 4.56 3.87 - 5.11 MIL/uL   Hemoglobin 14.0 12.0 - 15.0 g/dL   HCT 40.1 36.0 - 46.0 %   MCV 87.9 80.0 - 100.0 fL   MCH 30.7 26.0 - 34.0 pg   MCHC 34.9 30.0 - 36.0 g/dL   RDW 11.4 (L) 11.5 - 15.5 %   Platelets 206 150 - 400 K/uL   nRBC 0.0 0.0 - 0.2 %    Comment: Performed at [facility], [place] [address], [place],  [address]  Urinalysis, Routine w reflex microscopic Urine, Clean Catch     Status: Abnormal   Collection Time: 03/11/22 11:16  AM  Result Value Ref Range   Color, Urine YELLOW YELLOW   APPearance CLEAR CLEAR   Specific Gravity, Urine 1.020 1.005 - 1.030   pH 5.0 5.0 - 8.0   Glucose, UA NEGATIVE NEGATIVE mg/dL   Hgb urine dipstick NEGATIVE NEGATIVE   Bilirubin Urine NEGATIVE NEGATIVE   Ketones, ur 80 (A) NEGATIVE mg/dL   Protein, ur NEGATIVE NEGATIVE mg/dL   Nitrite NEGATIVE NEGATIVE   Leukocytes,Ua NEGATIVE NEGATIVE   RBC / HPF 0-5 0 - 5 RBC/hpf   WBC, UA 0-5 0 - 5 WBC/hpf   Bacteria, UA NONE SEEN NONE SEEN   Squamous Epithelial / LPF 0-5 0 - 5   Mucus PRESENT     Comment: Performed at [facility], [place] [address] [place], [place] [address]   CT ABDOMEN PELVIS WO CONTRAST  Result Date: 03/11/2022 CLINICAL DATA:  Abdominal pain and bloating for several days. No bowel movement for 4 days. History of metastatic rectal cancer. * Tracking Code: BO * EXAM: CT ABDOMEN AND PELVIS WITHOUT CONTRAST TECHNIQUE: Multidetector CT imaging of the abdomen and pelvis was performed following the standard protocol without IV contrast. RADIATION DOSE REDUCTION: This exam was performed according to the departmental dose-optimization program which includes automated exposure control, adjustment of the mA and/or kV according to patient size and/or use of iterative reconstruction technique. COMPARISON:  03/01/2022 MRI abdomen.  02/08/2022 CT abdomen/pelvis.  FINDINGS: Lower chest: No acute abnormality at the lung bases. Mildly prominent 0.8 cm right pericardiophrenic lymph node (series 2/image 10), increased from 0.6 cm on 02/08/2022 CT. Partially visualized bilateral breast prostheses. Hepatobiliary: Several (greater than 5) ill-defined hypodense liver masses scattered throughout the liver, appearing mildly increased since 02/08/2022 CT. Representative 2.4 cm posterior right liver mass (series 2/image 17), increased from 1.8 cm. Central right liver 1.5 cm mass (series 2/image 12), mildly increased from 1.2 cm. Anterior left liver 1.3 cm mass (series 2/image 13), increased from 1.1 cm. Normal gallbladder with no radiopaque cholelithiasis. No biliary ductal dilatation. Pancreas: Normal, with no mass or duct dilation. Spleen: Normal size. No mass. Adrenals/Urinary Tract: Normal adrenals. No renal stones. No hydronephrosis. Simple 1.4 cm medial interpolar right renal cyst, for which no follow-up is recommended. Otherwise no contour deforming renal masses. Normal bladder. Stomach/Bowel: Normal non-distended stomach. No dilated small bowel loops. Scattered air-fluid levels in the distal small bowel. No small bowel wall thickening. Mildly dilated fluid-filled appendix without appendiceal wall thickening or significant periappendiceal fat stranding. Annular wall thickening at the rectosigmoid junction involving an approximately 8 cm in length segment, proximal to which the colon is prominently distended with widespread colonic air-fluid levels. Cecal diameter 8.8 cm. Vascular/Lymphatic: Normal caliber abdominal aorta. Left para-lymphadenopathy up to 1.0 cm (series 2/image 36), increased from 0.7 cm. Multiple enlarged left perirectal nodes, largest 1.6 cm short axis diameter (series 2/image 65), stable. Reproductive: Status post hysterectomy, with no abnormal findings at the vaginal cuff. No adnexal mass. Other: No pneumoperitoneum. Small volume pelvic ascites. Presacral space  ill-defined fluid and perirectal fat stranding is mildly increased. No focal fluid collection. Musculoskeletal: Moderate L1 vertebral compression fracture is new since 02/08/2022 CT, without definite discrete osseous lesions. Mild thoracolumbar spondylosis. IMPRESSION: 1. Distal large bowel obstruction at the site of a large annular mass at the rectosigmoid junction. 2. Progressive liver metastases. 3. Progressive perirectal, left para-aortic and right pericardiophrenic nodal metastases. 4. Small volume pelvic ascites. 5. Moderate L1 vertebral compression fracture, new since 02/08/2022 CT, without definite underlying osseous lesion. Electronically  Signed   By: Ilona Sorrel M.D.   On: 03/11/2022 13:38      Assessment/Plan Large bowel obstruction secondary to Metastatic colorectal cancer with liver mets and LN mets - CT today with distal large bowel obstruction at the site of large annular mass at rectosigmoid junction, progressive liver mets, progressive nodal mets, small volume pelvic ascites - s/p first chemo on 03/04/22 - Patient is now fully obstructed and will need to be diverted. Plan for laparoscopic assisted diverting loop sigmoid colostomy tomorrow. I will send a message to her oncologist so they are aware she is here.  FEN: IVF, sips, NPO after midnight VTE: SCDs, ok for chemical DVT prophylaxis from surgical standpoint ID: none  HLD  Margie Billet, [facility] 03/11/2022, 2:33 PM Please see Amion for pager number during day hours 7:00am-4:30pm

## 2022-03-12 ENCOUNTER — Inpatient Hospital Stay (HOSPITAL_COMMUNITY): Payer: BC Managed Care – PPO

## 2022-03-12 ENCOUNTER — Inpatient Hospital Stay (HOSPITAL_COMMUNITY): Payer: BC Managed Care – PPO | Admitting: Certified Registered Nurse Anesthetist

## 2022-03-12 ENCOUNTER — Other Ambulatory Visit: Payer: Self-pay

## 2022-03-12 ENCOUNTER — Encounter (HOSPITAL_COMMUNITY)
Admission: EM | Disposition: A | Discharge status: Home Health | Payer: Self-pay | Source: Home / Self Care | Attending: Internal Medicine

## 2022-03-12 DIAGNOSIS — K56609 Unspecified intestinal obstruction, unspecified as to partial versus complete obstruction: Secondary | ICD-10-CM | POA: Diagnosis not present

## 2022-03-12 DIAGNOSIS — E871 Hypo-osmolality and hyponatremia: Secondary | ICD-10-CM | POA: Diagnosis not present

## 2022-03-12 DIAGNOSIS — S32010A Wedge compression fracture of first lumbar vertebra, initial encounter for closed fracture: Secondary | ICD-10-CM

## 2022-03-12 HISTORY — PX: ILEO LOOP COLOSTOMY CLOSURE: SHX5257

## 2022-03-12 LAB — DIFFERENTIAL
Abs Immature Granulocytes: 0 10*3/uL (ref 0.00–0.07)
Band Neutrophils: 10 %
Basophils Absolute: 0 10*3/uL (ref 0.0–0.1)
Basophils Relative: 0 %
Eosinophils Absolute: 0 10*3/uL (ref 0.0–0.5)
Eosinophils Relative: 0 %
Lymphocytes Relative: 23 %
Lymphs Abs: 0.2 10*3/uL — ABNORMAL LOW (ref 0.7–4.0)
Monocytes Absolute: 0.1 10*3/uL (ref 0.1–1.0)
Monocytes Relative: 13 %
Neutro Abs: 0.6 10*3/uL — ABNORMAL LOW (ref 1.7–7.7)
Neutrophils Relative %: 54 %

## 2022-03-12 LAB — COMPREHENSIVE METABOLIC PANEL
ALT: 30 U/L (ref 0–44)
AST: 30 U/L (ref 15–41)
Albumin: 3.1 g/dL — ABNORMAL LOW (ref 3.5–5.0)
Alkaline Phosphatase: 125 U/L (ref 38–126)
Anion gap: 9 (ref 5–15)
BUN: 12 mg/dL (ref 8–23)
CO2: 23 mmol/L (ref 22–32)
Calcium: 8.3 mg/dL — ABNORMAL LOW (ref 8.9–10.3)
Chloride: 101 mmol/L (ref 98–111)
Creatinine, Ser: 0.59 mg/dL (ref 0.44–1.00)
GFR, Estimated: >60 mL/min (ref >60)
Glucose, Bld: 110 mg/dL — ABNORMAL HIGH (ref 70–99)
Potassium: 3.5 mmol/L (ref 3.5–5.1)
Sodium: 133 mmol/L — ABNORMAL LOW (ref 135–145)
Total Bilirubin: 1.1 mg/dL (ref 0.3–1.2)
Total Protein: 6 g/dL — ABNORMAL LOW (ref 6.5–8.1)

## 2022-03-12 LAB — CBC
HCT: 37.1 % (ref 36.0–46.0)
Hemoglobin: 12.9 g/dL (ref 12.0–15.0)
MCH: 30.3 pg (ref 26.0–34.0)
MCHC: 34.8 g/dL (ref 30.0–36.0)
MCV: 87.1 fL (ref 80.0–100.0)
Platelets: 220 10*3/uL (ref 150–400)
RBC: 4.26 MIL/uL (ref 3.87–5.11)
RDW: 11.4 % — ABNORMAL LOW (ref 11.5–15.5)
WBC: 1 10*3/uL — CL (ref 4.0–10.5)
nRBC: 0 % (ref 0.0–0.2)

## 2022-03-12 SURGERY — CLOSURE, ILEOSTOMY, LAPAROSCOPIC, WITH LAPAROTOMY IF INDICATED
Anesthesia: General | Site: Abdomen

## 2022-03-12 MED ORDER — ACETAMINOPHEN 10 MG/ML IV SOLN: 1000.0000 mg | Freq: Once | INTRAVENOUS | Status: DC | PRN

## 2022-03-12 MED ORDER — ESMOLOL HCL 100 MG/10ML IV SOLN
INTRAVENOUS | Status: DC | PRN
  Administered 2022-03-12: 20 mg via INTRAVENOUS
  Administered 2022-03-12 (×5): 10 mg via INTRAVENOUS

## 2022-03-12 MED ORDER — SUCCINYLCHOLINE CHLORIDE 200 MG/10ML IV SOSY
PREFILLED_SYRINGE | INTRAVENOUS | Status: DC | PRN
  Administered 2022-03-12: 120 mg via INTRAVENOUS

## 2022-03-12 MED ORDER — MIDAZOLAM HCL 2 MG/2ML IJ SOLN
INTRAMUSCULAR | Status: AC
  Filled 2022-03-12: qty 2

## 2022-03-12 MED ORDER — LIDOCAINE HCL (PF) 2 % IJ SOLN
INTRAMUSCULAR | Status: DC | PRN
  Administered 2022-03-12: 1.5 mg/kg/h via INTRADERMAL

## 2022-03-12 MED ORDER — HYDROMORPHONE HCL 1 MG/ML IJ SOLN
INTRAMUSCULAR | Status: AC
  Filled 2022-03-12: qty 1

## 2022-03-12 MED ORDER — CHLORHEXIDINE GLUCONATE CLOTH 2 % EX PADS
6.0000 | MEDICATED_PAD | Freq: Every day | CUTANEOUS | Status: DC
  Administered 2022-03-13 – 2022-03-20 (×8): 6 via TOPICAL

## 2022-03-12 MED ORDER — BUPIVACAINE-EPINEPHRINE 0.25% -1:200000 IJ SOLN
INTRAMUSCULAR | Status: AC
  Filled 2022-03-12: qty 1

## 2022-03-12 MED ORDER — PROPOFOL 10 MG/ML IV BOLUS
INTRAVENOUS | Status: DC | PRN
Start: 2022-03-12 — End: 2022-03-12
  Administered 2022-03-12: 150 mg via INTRAVENOUS

## 2022-03-12 MED ORDER — FENTANYL CITRATE (PF) 250 MCG/5ML IJ SOLN
INTRAMUSCULAR | Status: AC
  Filled 2022-03-12: qty 5

## 2022-03-12 MED ORDER — ONDANSETRON HCL 4 MG/2ML IJ SOLN
INTRAMUSCULAR | Status: DC | PRN
  Administered 2022-03-12: 4 mg via INTRAVENOUS

## 2022-03-12 MED ORDER — ACETAMINOPHEN 10 MG/ML IV SOLN
INTRAVENOUS | Status: AC
  Filled 2022-03-12: qty 100

## 2022-03-12 MED ORDER — LIDOCAINE HCL (PF) 2 % IJ SOLN
INTRAMUSCULAR | Status: AC
  Filled 2022-03-12: qty 5

## 2022-03-12 MED ORDER — DEXAMETHASONE SODIUM PHOSPHATE 10 MG/ML IJ SOLN
INTRAMUSCULAR | Status: AC
  Filled 2022-03-12: qty 1

## 2022-03-12 MED ORDER — ALBUMIN HUMAN 5 % IV SOLN
INTRAVENOUS | Status: AC
  Filled 2022-03-12: qty 500

## 2022-03-12 MED ORDER — LIDOCAINE 2% (20 MG/ML) 5 ML SYRINGE
INTRAMUSCULAR | Status: DC | PRN
Start: 2022-03-12 — End: 2022-03-12
  Administered 2022-03-12: 100 mg via INTRAVENOUS

## 2022-03-12 MED ORDER — ROCURONIUM BROMIDE 10 MG/ML (PF) SYRINGE
PREFILLED_SYRINGE | INTRAVENOUS | Status: AC
  Filled 2022-03-12: qty 10

## 2022-03-12 MED ORDER — ACETAMINOPHEN 10 MG/ML IV SOLN
INTRAVENOUS | Status: DC | PRN
  Administered 2022-03-12: 1000 mg via INTRAVENOUS

## 2022-03-12 MED ORDER — LACTATED RINGERS IV SOLN
INTRAVENOUS | Status: DC | PRN
Start: 2022-03-12 — End: 2022-03-12

## 2022-03-12 MED ORDER — SODIUM CHLORIDE 0.9% FLUSH
10.0000 mL | INTRAVENOUS | Status: DC | PRN
  Administered 2022-03-20: 10 mL

## 2022-03-12 MED ORDER — DEXAMETHASONE SODIUM PHOSPHATE 4 MG/ML IJ SOLN
INTRAMUSCULAR | Status: DC | PRN
  Administered 2022-03-12: 8 mg via INTRAVENOUS

## 2022-03-12 MED ORDER — SCOPOLAMINE 1 MG/3DAYS TD PT72
MEDICATED_PATCH | TRANSDERMAL | Status: AC
  Filled 2022-03-12: qty 1

## 2022-03-12 MED ORDER — LACTATED RINGERS IV BOLUS
1000.0000 mL | Freq: Three times a day (TID) | INTRAVENOUS | Status: AC | PRN
Start: 2022-03-12 — End: 2022-03-14

## 2022-03-12 MED ORDER — KETAMINE HCL 50 MG/5ML IJ SOSY
PREFILLED_SYRINGE | INTRAMUSCULAR | Status: AC
  Filled 2022-03-12: qty 5

## 2022-03-12 MED ORDER — PHENYLEPHRINE 80 MCG/ML (10ML) SYRINGE FOR IV PUSH (FOR BLOOD PRESSURE SUPPORT)
PREFILLED_SYRINGE | INTRAVENOUS | Status: DC | PRN
  Administered 2022-03-12 (×3): 80 ug via INTRAVENOUS

## 2022-03-12 MED ORDER — MORPHINE SULFATE (PF) 2 MG/ML IV SOLN: 1.0000 mg | INTRAVENOUS | Status: DC | PRN

## 2022-03-12 MED ORDER — BUPIVACAINE-EPINEPHRINE (PF) 0.25% -1:200000 IJ SOLN
INTRAMUSCULAR | Status: AC
  Filled 2022-03-12: qty 30

## 2022-03-12 MED ORDER — ONDANSETRON HCL 4 MG/2ML IJ SOLN
INTRAMUSCULAR | Status: AC
  Filled 2022-03-12: qty 2

## 2022-03-12 MED ORDER — BUPIVACAINE LIPOSOME 1.3 % IJ SUSP
INTRAMUSCULAR | Status: AC
  Filled 2022-03-12: qty 20

## 2022-03-12 MED ORDER — BUPIVACAINE LIPOSOME 1.3 % IJ SUSP
INTRAMUSCULAR | Status: DC | PRN
Start: 2022-03-12 — End: 2022-03-12
  Administered 2022-03-12: 20 mL

## 2022-03-12 MED ORDER — LACTATED RINGERS IR SOLN
Status: DC | PRN
  Administered 2022-03-12: 1000 mL

## 2022-03-12 MED ORDER — LACTATED RINGERS IV SOLN: INTRAVENOUS | Status: DC | PRN

## 2022-03-12 MED ORDER — SCOPOLAMINE 1 MG/3DAYS TD PT72
MEDICATED_PATCH | TRANSDERMAL | Status: DC | PRN
  Administered 2022-03-12: 1 via TRANSDERMAL

## 2022-03-12 MED ORDER — FENTANYL CITRATE (PF) 100 MCG/2ML IJ SOLN
INTRAMUSCULAR | Status: DC | PRN
  Administered 2022-03-12: 100 ug via INTRAVENOUS
  Administered 2022-03-12 (×3): 50 ug via INTRAVENOUS

## 2022-03-12 MED ORDER — ROCURONIUM BROMIDE 10 MG/ML (PF) SYRINGE
PREFILLED_SYRINGE | INTRAVENOUS | Status: DC | PRN
  Administered 2022-03-12: 10 mg via INTRAVENOUS
  Administered 2022-03-12: 50 mg via INTRAVENOUS

## 2022-03-12 MED ORDER — MIDAZOLAM HCL 5 MG/5ML IJ SOLN
INTRAMUSCULAR | Status: DC | PRN
  Administered 2022-03-12: 2 mg via INTRAVENOUS

## 2022-03-12 MED ORDER — PROPOFOL 10 MG/ML IV BOLUS
INTRAVENOUS | Status: AC
  Filled 2022-03-12: qty 20

## 2022-03-12 MED ORDER — LIDOCAINE HCL 2 % IJ SOLN
INTRAMUSCULAR | Status: AC
  Filled 2022-03-12: qty 20

## 2022-03-12 MED ORDER — BUPIVACAINE-EPINEPHRINE 0.25% -1:200000 IJ SOLN
INTRAMUSCULAR | Status: DC | PRN
  Administered 2022-03-12: 30 mL

## 2022-03-12 MED ORDER — ALBUMIN HUMAN 5 % IV SOLN
INTRAVENOUS | Status: DC | PRN
Start: 2022-03-12 — End: 2022-03-12

## 2022-03-12 MED ORDER — 0.9 % SODIUM CHLORIDE (POUR BTL) OPTIME
TOPICAL | Status: DC | PRN
Start: 2022-03-12 — End: 2022-03-12
  Administered 2022-03-12: 1000 mL

## 2022-03-12 MED ORDER — ONDANSETRON HCL 4 MG/2ML IJ SOLN: 4.0000 mg | Freq: Once | INTRAMUSCULAR | Status: DC | PRN

## 2022-03-12 MED ORDER — DROPERIDOL 2.5 MG/ML IJ SOLN
INTRAMUSCULAR | Status: AC
  Filled 2022-03-12: qty 2

## 2022-03-12 MED ORDER — ORAL CARE MOUTH RINSE
15.0000 mL | Freq: Two times a day (BID) | OROMUCOSAL | Status: DC
  Administered 2022-03-12 – 2022-03-20 (×12): 15 mL via OROMUCOSAL

## 2022-03-12 MED ORDER — SUGAMMADEX SODIUM 200 MG/2ML IV SOLN
INTRAVENOUS | Status: DC | PRN
  Administered 2022-03-12: 200 mg via INTRAVENOUS

## 2022-03-12 MED ORDER — PHENYLEPHRINE 80 MCG/ML (10ML) SYRINGE FOR IV PUSH (FOR BLOOD PRESSURE SUPPORT)
PREFILLED_SYRINGE | INTRAVENOUS | Status: AC
  Filled 2022-03-12: qty 10

## 2022-03-12 MED ORDER — SUCCINYLCHOLINE CHLORIDE 200 MG/10ML IV SOSY
PREFILLED_SYRINGE | INTRAVENOUS | Status: AC
  Filled 2022-03-12: qty 10

## 2022-03-12 MED ORDER — ALBUMIN HUMAN 5 % IV SOLN
INTRAVENOUS | Status: AC
  Filled 2022-03-12: qty 250

## 2022-03-12 MED ORDER — DROPERIDOL 2.5 MG/ML IJ SOLN
INTRAMUSCULAR | Status: DC | PRN
  Administered 2022-03-12: .625 mg via INTRAVENOUS

## 2022-03-12 MED ORDER — LIDOCAINE-PRILOCAINE 2.5-2.5 % EX CREA: TOPICAL_CREAM | Freq: Once | CUTANEOUS | Status: AC

## 2022-03-12 SURGICAL SUPPLY — 76 items
APPLIER CLIP 5 13 M/L LIGAMAX5 (MISCELLANEOUS)
APPLIER CLIP ROT 10 11.4 M/L (STAPLE)
BAG COUNTER SPONGE SURGICOUNT (BAG) IMPLANT
CABLE HIGH FREQUENCY MONO STRZ (ELECTRODE) ×2 IMPLANT
CATH MUSHROOM 32FR (CATHETERS) ×1 IMPLANT
CELLS DAT CNTRL 66122 CELL SVR (MISCELLANEOUS) IMPLANT
CHLORAPREP W/TINT 26 (MISCELLANEOUS) ×2 IMPLANT
CLIP APPLIE 5 13 M/L LIGAMAX5 (MISCELLANEOUS) IMPLANT
CLIP APPLIE ROT 10 11.4 M/L (STAPLE) IMPLANT
COUNTER NEEDLE 20 DBL MAG RED (NEEDLE) ×2 IMPLANT
COVER MAYO STAND STRL (DRAPES) ×6 IMPLANT
COVER SURGICAL LIGHT HANDLE (MISCELLANEOUS) ×2 IMPLANT
DRAIN CHANNEL 19F RND (DRAIN) IMPLANT
DRAPE LAPAROSCOPIC ABDOMINAL (DRAPES) ×2 IMPLANT
DRAPE SURG IRRIG POUCH 19X23 (DRAPES) ×2 IMPLANT
DRSG OPSITE POSTOP 4X10 (GAUZE/BANDAGES/DRESSINGS) IMPLANT
DRSG OPSITE POSTOP 4X6 (GAUZE/BANDAGES/DRESSINGS) IMPLANT
DRSG OPSITE POSTOP 4X8 (GAUZE/BANDAGES/DRESSINGS) IMPLANT
DRSG TEGADERM 2-3/8X2-3/4 SM (GAUZE/BANDAGES/DRESSINGS) ×4 IMPLANT
DRSG TEGADERM 4X4.75 (GAUZE/BANDAGES/DRESSINGS) ×1 IMPLANT
ELECT REM PT RETURN 15FT ADLT (MISCELLANEOUS) ×2 IMPLANT
ENDOLOOP SUT PDS II  0 18 (SUTURE)
ENDOLOOP SUT PDS II 0 18 (SUTURE) IMPLANT
EVACUATOR SILICONE 100CC (DRAIN) IMPLANT
GAUZE SPONGE 2X2 8PLY STRL LF (GAUZE/BANDAGES/DRESSINGS) ×1 IMPLANT
GAUZE SPONGE 4X4 12PLY STRL (GAUZE/BANDAGES/DRESSINGS) ×2 IMPLANT
GLOVE ECLIPSE 8.0 STRL XLNG CF (GLOVE) ×4 IMPLANT
GLOVE INDICATOR 8.0 STRL GRN (GLOVE) ×4 IMPLANT
GOWN STRL REUS W/ TWL XL LVL3 (GOWN DISPOSABLE) ×5 IMPLANT
GOWN STRL REUS W/TWL XL LVL3 (GOWN DISPOSABLE) ×5
IRRIG SUCT STRYKERFLOW 2 WTIP (MISCELLANEOUS) ×2
IRRIGATION SUCT STRKRFLW 2 WTP (MISCELLANEOUS) ×1 IMPLANT
KIT TURNOVER KIT A (KITS) IMPLANT
LEGGING LITHOTOMY PAIR STRL (DRAPES) IMPLANT
PACK COLON (CUSTOM PROCEDURE TRAY) ×2 IMPLANT
PAD POSITIONING PINK XL (MISCELLANEOUS) ×2 IMPLANT
PENCIL SMOKE EVACUATOR (MISCELLANEOUS) IMPLANT
PROTECTOR NERVE ULNAR (MISCELLANEOUS) IMPLANT
RETRACTOR WND ALEXIS 18 MED (MISCELLANEOUS) IMPLANT
RTRCTR WOUND ALEXIS 18CM MED (MISCELLANEOUS)
SCISSORS LAP 5X35 DISP (ENDOMECHANICALS) ×2 IMPLANT
SEALER TISSUE G2 STRG ARTC 35C (ENDOMECHANICALS) IMPLANT
SET TUBE SMOKE EVAC HIGH FLOW (TUBING) ×2 IMPLANT
SLEEVE ADV FIXATION 5X100MM (TROCAR) IMPLANT
SLEEVE Z-THREAD 5X100MM (TROCAR) IMPLANT
SPIKE FLUID TRANSFER (MISCELLANEOUS) ×2 IMPLANT
SPONGE GAUZE 2X2 STER 10/PKG (GAUZE/BANDAGES/DRESSINGS) ×1
STAPLER 90 3.5 STAND SLIM (STAPLE) ×2
STAPLER 90 3.5 STD SLIM (STAPLE) IMPLANT
STAPLER VISISTAT 35W (STAPLE) IMPLANT
SURGILUBE 2OZ TUBE FLIPTOP (MISCELLANEOUS) IMPLANT
SUT MNCRL AB 4-0 PS2 18 (SUTURE) ×2 IMPLANT
SUT PDS AB 1 CT1 27 (SUTURE) ×2 IMPLANT
SUT PDS AB 1 TP1 96 (SUTURE) IMPLANT
SUT PROLENE 0 CT 2 (SUTURE) IMPLANT
SUT PROLENE 2 0 SH DA (SUTURE) IMPLANT
SUT SILK 2 0 (SUTURE) ×1
SUT SILK 2 0 SH CR/8 (SUTURE) ×2 IMPLANT
SUT SILK 2-0 18XBRD TIE 12 (SUTURE) ×1 IMPLANT
SUT SILK 3 0 (SUTURE) ×1
SUT SILK 3 0 SH CR/8 (SUTURE) ×2 IMPLANT
SUT SILK 3-0 18XBRD TIE 12 (SUTURE) ×1 IMPLANT
SUT VIC AB 2-0 SH 18 (SUTURE) ×1 IMPLANT
SUT VICRYL 0 UR6 27IN ABS (SUTURE) ×2 IMPLANT
SYS LAPSCP GELPORT 120MM (MISCELLANEOUS)
SYS WOUND ALEXIS 18CM MED (MISCELLANEOUS) ×2
SYSTEM LAPSCP GELPORT 120MM (MISCELLANEOUS) IMPLANT
SYSTEM WOUND ALEXIS 18CM MED (MISCELLANEOUS) IMPLANT
TAPE UMBILICAL 1/8 X36 TWILL (MISCELLANEOUS) ×2 IMPLANT
TOWEL OR 17X26 10 PK STRL BLUE (TOWEL DISPOSABLE) IMPLANT
TOWEL OR NON WOVEN STRL DISP B (DISPOSABLE) ×2 IMPLANT
TRAY FOLEY MTR SLVR 16FR STAT (SET/KITS/TRAYS/PACK) IMPLANT
TROCAR ADV FIXATION 5X100MM (TROCAR) IMPLANT
TROCAR XCEL NON-BLD 11X100MML (ENDOMECHANICALS) IMPLANT
TROCAR Z-THREAD OPTICAL 5X100M (TROCAR) ×2 IMPLANT
TUBING CONNECTING 10 (TUBING) IMPLANT

## 2022-03-12 NOTE — Progress Notes (Signed)
[facility] PROGRESS NOTE   Amelia Burgard EPP:[id number] DOB: 19-Sep-1961 DOA: 03/11/2022  PCP: Gwenlyn Perking, FNP  Brief History/Interval Summary: 61 y.o. female with medical history significant for colorectal cancer, HLD.  Presented with abdominal pain and distention.  Was found to have small bowel obstruction from her colonic mass.  Hospitalized for further management.    Consultants: Medical oncology.  General surgery  Procedures: Plan is for diverting loop colostomy today    Subjective/Interval History: Complains of abdominal pain.  6 out of 10 in intensity.  Some nausea but no vomiting.  No chest pain or shortness of breath.   Assessment/Plan:  Bowel obstruction secondary to colorectal cancer Patient with known history of colorectal cancer.  Seen by general surgery.  Plan is for diverting loop colostomy today.  Pain control.  Colorectal cancer with metastases Followed by medical oncology.  Diagnosed in April 2023.  She is status post 1 cycle of FOLFOX.  Medical oncology is aware of patient's hospitalization. They are evaluating patient's Port-A-Cath.  Neutropenia Probably secondary to chemotherapeutic infusion that she received on May 15.  Check differential.  Check labs tomorrow.  Noted to be afebrile.  Will defer marrow stimulating agents to medical oncology.  L1 vertebral compression fracture Does not appear to be particularly symptomatic from this.  PT and OT evaluation after surgery.  Hypokalemia Repleted.  Recheck labs tomorrow.  Hyponatremia Stable.  DVT Prophylaxis: SCDs.  Definitive chemoprophylaxis after surgery per general surgeon Code Status: Full code Family Communication: Discussed with patient Disposition Plan: To be determined  Status is: Inpatient Remains inpatient appropriate because: Small bowel obstruction      Medications: Scheduled:  [MAR Hold] acetaminophen  1,000 mg Oral On Call to OR   [facility] Hold] alvimopan  12 mg Oral On  Call to OR   [facility] Hold] bupivacaine liposome  20 mL Infiltration On Call to OR   [facility] Hold] celecoxib  200 mg Oral On Call to OR   droperidol       [MAR Hold] enoxaparin (LOVENOX) injection  40 mg Subcutaneous On Call to OR   [facility] Hold] feeding supplement  296 mL Oral Once   [MAR Hold] feeding supplement  592 mL Oral Once   [MAR Hold] gabapentin  300 mg Oral On Call to OR   [facility] Hold] lip balm  1 application. Topical BID   neomycin  1,000 mg Oral 3 times per day   And   metroNIDAZOLE  1,000 mg Oral 3 times per day   Continuous:  sodium chloride 1,000 mL (03/11/22 2133)   [MAR Hold] cefoTEtan (CEFOTAN) IV     [MAR Hold] lactated ringers     [MAR Hold] methocarbamol (ROBAXIN) IV     [MAR Hold] ondansetron (ZOFRAN) IV     PRN:[MAR Hold] alum & mag hydroxide-simeth, [MAR Hold] diphenhydrAMINE, [MAR Hold]  HYDROmorphone (DILAUDID) injection, [MAR Hold] lactated ringers, [MAR Hold] magic mouthwash, [MAR Hold] menthol-cetylpyridinium, [MAR Hold] methocarbamol (ROBAXIN) IV, [MAR Hold]  morphine injection, [MAR Hold] ondansetron (ZOFRAN) IV **OR** [MAR Hold] ondansetron (ZOFRAN) IV, [MAR Hold] phenol, [MAR Hold] prochlorperazine, [MAR Hold] simethicone  Antibiotics: Anti-infectives (From admission, onward)    Start     Dose/Rate Route Frequency Ordered Stop   03/12/22 0600  [MAR Hold]  cefoTEtan (CEFOTAN) 2 g in sodium chloride 0.9 % 100 mL IVPB        (MAR Hold since Thu 03/12/2022 at 0937.Hold Reason: Transfer to a Procedural area)   2 g 200 mL/hr over  30 Minutes Intravenous On call to O.R. 03/11/22 1508 03/13/22 0559   03/11/22 2000  neomycin (MYCIFRADIN) tablet 1,000 mg       See Hyperspace for full Linked Orders Report.   1,000 mg Oral 3 times per day 03/11/22 1508 03/12/22 1459   03/11/22 2000  metroNIDAZOLE (FLAGYL) tablet 1,000 mg       See Hyperspace for full Linked Orders Report.   1,000 mg Oral 3 times per day 03/11/22 1508 03/12/22 1459       Objective:  Vital  Signs  Vitals:   03/11/22 1755 03/11/22 2150 03/12/22 0152 03/12/22 0549  BP: (!) 144/74 (!) 145/75 135/78 140/80  Pulse: 86 89 98 99  Resp: '15 16 16 19  '$ Temp: 98.8 F (37.1 C) (!) 97.4 F (36.3 C) 98.1 F (36.7 C) 98.7 F (37.1 C)  TempSrc: Oral Oral Oral Oral  SpO2: 100% 96% 96% 92%    Intake/Output Summary (Last 24 hours) at 03/12/2022 1101 Last data filed at 03/12/2022 6195 Gross per 24 hour  Intake 1328.22 ml  Output 300 ml  Net 1028.22 ml   There were no vitals filed for this visit.  General appearance: Awake alert.  In no distress Resp: Clear to auscultation bilaterally.  Normal effort Cardio: S1-S2 is normal regular.  No S3-S4.  No rubs murmurs or bruit GI: Abdomen is noted to be mildly distended.  Tender diffusely without any rebound rigidity or guarding.  No masses organomegaly. Extremities: No edema.  Moving all extremities Neurologic: Alert and oriented x3.  No focal neurological deficits.    Lab Results:  Data Reviewed: I have personally reviewed following labs and reports of the imaging studies  CBC: Recent Labs  Lab 03/11/22 1057 03/12/22 0921  WBC 3.2* 1.0*  HGB 14.0 12.9  HCT 40.1 37.1  MCV 87.9 87.1  PLT 206 093    Basic Metabolic Panel: Recent Labs  Lab 03/11/22 1057 03/12/22 0921  NA 133* 133*  K 3.2* 3.5  CL 94* 101  CO2 27 23  GLUCOSE 102* 110*  BUN 9 12  CREATININE 0.63 0.59  CALCIUM 8.9 8.3*  MG 2.3  --     GFR: Estimated Creatinine Clearance: 68.3 mL/min (by C-G formula based on SCr of 0.59 mg/dL).  Liver Function Tests: Recent Labs  Lab 03/11/22 1057 03/12/22 0921  AST 29 30  ALT 28 30  ALKPHOS 121 125  BILITOT 0.9 1.1  PROT 6.9 6.0*  ALBUMIN 3.7 3.1*    Recent Labs  Lab 03/11/22 1057  LIPASE 24    HbA1C: Recent Labs    03/11/22 1057  HGBA1C 5.5    Radiology Studies: CT ABDOMEN PELVIS WO CONTRAST  Result Date: 03/11/2022 CLINICAL DATA:  Abdominal pain and bloating for several days. No bowel  movement for 4 days. History of metastatic rectal cancer. * Tracking Code: BO * EXAM: CT ABDOMEN AND PELVIS WITHOUT CONTRAST TECHNIQUE: Multidetector CT imaging of the abdomen and pelvis was performed following the standard protocol without IV contrast. RADIATION DOSE REDUCTION: This exam was performed according to the departmental dose-optimization program which includes automated exposure control, adjustment of the mA and/or kV according to patient size and/or use of iterative reconstruction technique. COMPARISON:  03/01/2022 MRI abdomen.  02/08/2022 CT abdomen/pelvis. FINDINGS: Lower chest: No acute abnormality at the lung bases. Mildly prominent 0.8 cm right pericardiophrenic lymph node (series 2/image 10), increased from 0.6 cm on 02/08/2022 CT. Partially visualized bilateral breast prostheses. Hepatobiliary: Several (greater than 5) ill-defined hypodense  liver masses scattered throughout the liver, appearing mildly increased since 02/08/2022 CT. Representative 2.4 cm posterior right liver mass (series 2/image 17), increased from 1.8 cm. Central right liver 1.5 cm mass (series 2/image 12), mildly increased from 1.2 cm. Anterior left liver 1.3 cm mass (series 2/image 13), increased from 1.1 cm. Normal gallbladder with no radiopaque cholelithiasis. No biliary ductal dilatation. Pancreas: Normal, with no mass or duct dilation. Spleen: Normal size. No mass. Adrenals/Urinary Tract: Normal adrenals. No renal stones. No hydronephrosis. Simple 1.4 cm medial interpolar right renal cyst, for which no follow-up is recommended. Otherwise no contour deforming renal masses. Normal bladder. Stomach/Bowel: Normal non-distended stomach. No dilated small bowel loops. Scattered air-fluid levels in the distal small bowel. No small bowel wall thickening. Mildly dilated fluid-filled appendix without appendiceal wall thickening or significant periappendiceal fat stranding. Annular wall thickening at the rectosigmoid junction  involving an approximately 8 cm in length segment, proximal to which the colon is prominently distended with widespread colonic air-fluid levels. Cecal diameter 8.8 cm. Vascular/Lymphatic: Normal caliber abdominal aorta. Left para-lymphadenopathy up to 1.0 cm (series 2/image 36), increased from 0.7 cm. Multiple enlarged left perirectal nodes, largest 1.6 cm short axis diameter (series 2/image 65), stable. Reproductive: Status post hysterectomy, with no abnormal findings at the vaginal cuff. No adnexal mass. Other: No pneumoperitoneum. Small volume pelvic ascites. Presacral space ill-defined fluid and perirectal fat stranding is mildly increased. No focal fluid collection. Musculoskeletal: Moderate L1 vertebral compression fracture is new since 02/08/2022 CT, without definite discrete osseous lesions. Mild thoracolumbar spondylosis. IMPRESSION: 1. Distal large bowel obstruction at the site of a large annular mass at the rectosigmoid junction. 2. Progressive liver metastases. 3. Progressive perirectal, left para-aortic and right pericardiophrenic nodal metastases. 4. Small volume pelvic ascites. 5. Moderate L1 vertebral compression fracture, new since 02/08/2022 CT, without definite underlying osseous lesion. Electronically Signed   By: Ilona Sorrel M.D.   On: 03/11/2022 13:38   DG Chest 1 View  Addendum Date: 03/12/2022   ADDENDUM REPORT: 03/12/2022 10:53 ADDENDUM: Requested addendum. The Port-A-Cath reservoir appears in expected position, does not appear flipped. Electronically Signed   By: Lavonia Dana M.D.   On: 03/12/2022 10:53   Result Date: 03/12/2022 CLINICAL DATA:  Port-A-Cath placement EXAM: CHEST  1 VIEW COMPARISON:  Portable exam 0851 hours without priors for comparison FINDINGS: RIGHT jugular Port-A-Cath with tip projecting over SVC. Normal heart size, mediastinal contours, and pulmonary vascularity. Lungs clear. No infiltrate, pleural effusion, or pneumothorax. Bones appear demineralized.  IMPRESSION: No pneumothorax following RIGHT jugular Port-A-Cath insertion. Electronically Signed: By: Lavonia Dana M.D. On: 03/12/2022 09:54       LOS: 1 day   [facility] Pager on [email or web address]  03/12/2022, 11:01 AM

## 2022-03-12 NOTE — Op Note (Signed)
03/12/2022  12:39 PM  PATIENT:  Karen Kaufman  61 y.o. 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  PROCEDURE: LAPAROSCOPIC ASSISTED DIVERTING COLOSTOMY  SURGEON:  Adin Hector, MD  ASSISTANT: Traci Pozil RNFA   ANESTHESIA:     General  Nerve block provided with liposomal bupivacaine (Experel) mixed with 0.25% bupivacaine as a Regional field block x29m at incisions   Local field block at port sites & extraction wound  EBL:  Total I/O In: 2796.5 [I.V.:2296.5; IV Piggyback:500] Out: 480 [Urine:430; Blood:50]  Delay start of Pharmacological VTE agent (>24hrs) due to surgical blood loss or risk of bleeding:  no  DRAINS: NO    SPECIMEN:  NONE  DISPOSITION OF SPECIMEN:  PATHOLOGY  COUNTS:  YES  PLAN OF CARE: Admit to inpatient   PATIENT DISPOSITION:  PACU - guarded condition.  INDICATION:    Woman with worsening mental changes and constipation.  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  Work-up noted metastatic disease.  Placed on chemotherapy but had worsening obstructive symptoms with complete obstipation.  I recommended laparoscopic possible open diverting colostomy to help decompress things.    The anatomy & physiology of the digestive tract was discussed.  The pathophysiology was discussed.  Natural history risks without surgery was discussed.   I worked to give an overview of the disease and the frequent need to have multispecialty involvement.  I feel the risks of no intervention will lead to serious problems that outweigh the operative risks; therefore, I  recommended a diverting loop colostomy proximal to the obstructing rectosigmoid cancer to palliate the obstipation obstruction prevent perforation.  Laparoscopic & open techniques were discussed.   Risks such as bleeding, infection, abscess, leak, reoperation, possible ostomy, hernia, heart attack, death, and other risks were discussed.  I noted a good likelihood this will help address the problem.   Goals of post-operative recovery were discussed as well.  We will work to minimize complications.  Educational materials on the pathology had been given in the office.  Questions were answered.    The patient expressed understanding & wished to proceed with surgery.  OR FINDINGS:   Patient had very dilated colon especially on the proximal right side.  Cecum dilated but viable.  No evidence of ischemia nor perforation.  Moderately dilated small bowel as well.  It is a diverting mid descending split loop colostomy.  90% of it is a proximal limb with a decompressed mucous fistula in the inferior midline at "6:00"  DESCRIPTION:   Informed consent was confirmed.  The patient underwent general anaesthesia without difficulty.  The patient was positioned appropriately.  VTE prevention in place.  The patient's abdomen was clipped, prepped, & draped in a sterile fashion.  I asked anesthesia to place a nasogastric tube to help decompress the stomach and some small intestine since it appeared to be dilated as well.  A surgical timeout confirmed our plan.  The patient was positioned in reverse Trendelenburg.  Abdominal entry was gained using optical entry technique in the  right upper abdomen.  Entry was clean.  I induced carbon dioxide insufflation.  Camera inspection revealed no injury.  Extra ports were carefully placed  under direct laparoscopic visualization.  Upon entering the abdomen (organ space), I encountered massively dilated colon and small bowel.  Great omentum was adherent to the pelvis.  Elevated  transected and lifted off taking help and going to the upper abdomen.  While very dilated I was able to find the descending and sigmoid colon.  Mobilized in a lateral to medial fashion.  There was some redundancy of the sigmoid colon but would not reach to the left upper quadrant region.  Eventually mobilized the left colon towards the midline.  I felt he could reach the left upper quadrant premarked region.  VAC with carbon oxide.  I placed a wound protector through the left upper quadrant premarked colostomy site.  I had done a 4 cm circular wound at the skin and dermis and excised, fat.  I split the anterior to his fascia transversely and came through the rectus muscle and posterior to his fascia vertically.  Placed a wound protector.  I was able to eviscerate the dilated colon a fair amount of small bowel came up but I was able to get that reduced.  I used a T90 to transect the loop colostomy with the distal and more flush with the skin.  I placed a pool tip sucker into the proximal limb after holding it up to decompress a large volume of flatus and some liquid stool.  That helped decompress the abdomen to be much less Turgeon and a little softer.  Mucosa was very edematous and friable but viable.  I placed #1 PDS on the medial and lateral corners of the anterior rectus fascia to help tighten the parastomal defect better now that the colon was decompressed.  This helped keep the small bowel reduced as well.  I closed the laparoscopic port sites using 4 Monocryl suture and sterile dressing.  I then focused on colostomy creation.  Used interrupted 2-0 Vicryl suture from the colon into the fascia on 4 corners on the proximal limb.  Then brought sutures from dermis to the edge of the proximal open end of the colostomy tiedown to have a good Brooke colostomy.  I opened up the corner of the distal limb TX staple line and created a 15 mm distal mucous fistula.  Use interrupted Vicryl suture along the edges.  I  then managed the superior end of the mucous fistula to the inferior end of the proximal end colostomy.  Then proceeded to do intervening Vicryl sutures for good dermis to mucosas to have a good circumferential per colostomy.  Finger intubation noted there was no twisting or torsion.  Mucosa was friable but I was able to assure hemostasis.  Because of the massively dilated colon with significant edema I placed a large Malecot tube to intubate across the colostomy with a blunt Malecot tip in the intraperitoneal descending colon.  Placed colostomy appliance.  With that large volume of liquid stool was able to come out into the colostomy.  Mucosa well from bowel had decent hemostasis.  Patient was rather tachycardic at the start of the case and labile but seem to be much more heme Niccoli stable.  We will watch in recovery room.  May need to be in stepdown unit but hopefully if remains stable and alert can go back up to the floor.  I discussed postop care with the patient in the holding area. Instructions are written.  I discussed operative findings, updated the patient's status, discussed probable steps to recovery, and gave postoperative recommendations to the patient's  spouse, Thao Bauza.   Recommendations were made.  Questions were answered.  He expressed understanding & appreciation.   Adin Hector, M.D., F.A.C.S. Gastrointestinal and Minimally Invasive Surgery [facility] [address] [place], [place] [address] [phone] Main / Paging

## 2022-03-12 NOTE — Anesthesia Preprocedure Evaluation (Addendum)
Anesthesia Evaluation  Patient identified by MRN, date of birth, ID band Patient awake  General Assessment Comment:Metastatic colon cancer with bowel obstruction  Reviewed: Allergy & Precautions, NPO status , Patient's Chart, lab work & pertinent test results  Airway Mallampati: III  TM Distance: <3 FB Neck ROM: Full    Dental no notable dental hx.    Pulmonary neg pulmonary ROS,    Pulmonary exam normal breath sounds clear to auscultation       Cardiovascular negative cardio ROS Normal cardiovascular exam Rhythm:Regular Rate:Normal     Neuro/Psych negative neurological ROS  negative psych ROS   GI/Hepatic Neg liver ROS, Colon cancer   Endo/Other  negative endocrine ROS  Renal/GU negative Renal ROS  negative genitourinary   Musculoskeletal negative musculoskeletal ROS (+)   Abdominal   Peds negative pediatric ROS (+)  Hematology negative hematology ROS (+)   Anesthesia Other Findings   Reproductive/Obstetrics negative OB ROS                            Anesthesia Physical Anesthesia Plan  ASA: 3  Anesthesia Plan: General   Post-op Pain Management: Lidocaine infusion*   Induction: Intravenous and Rapid sequence  PONV Risk Score and Plan: 3 and Ondansetron, Dexamethasone, Droperidol and Treatment may vary due to age or medical condition  Airway Management Planned: Oral ETT  Additional Equipment:   Intra-op Plan:   Post-operative Plan: Extubation in OR  Informed Consent: I have reviewed the patients History and Physical, chart, labs and discussed the procedure including the risks, benefits and alternatives for the proposed anesthesia with the patient or authorized representative who has indicated his/her understanding and acceptance.     Dental advisory given  Plan Discussed with: CRNA and Surgeon  Anesthesia Plan Comments:         Anesthesia Quick Evaluation

## 2022-03-12 NOTE — Transfer of Care (Signed)
Immediate Anesthesia Transfer of Care Note  Patient: Karen Kaufman  Procedure(s) Performed: Procedure(s): LAPAROSCOPIC ASSISTED DIVERTING COLOSTOMY (N/A)  Patient Location: PACU  Anesthesia Type:General  Level of Consciousness: Patient easily awoken, sedated, comfortable, cooperative, following commands, responds to stimulation.   Airway & Oxygen Therapy: Patient spontaneously breathing, ventilating well, oxygen via simple oxygen mask.  Post-op Assessment: Report given to PACU RN, vital signs reviewed and stable, moving all extremities.   Post vital signs: Reviewed and stable.  Complications: No apparent anesthesia complications Last Vitals:  Vitals Value Taken Time  BP 124/77 03/12/22 1247  Temp    Pulse 106 03/12/22 1252  Resp 18 03/12/22 1252  SpO2 100 % 03/12/22 1252  Vitals shown include unvalidated device data.  Last Pain:  Vitals:   03/12/22 0900  TempSrc:   PainSc: Asleep         Complications: No notable events documented.

## 2022-03-12 NOTE — Progress Notes (Addendum)
HEMATOLOGY-ONCOLOGY PROGRESS NOTE  ASSESSMENT AND PLAN: Colorectal cancer 02/08/2022 CT abdomen/pelvis without contrast-severe circumferential wall thickening of the distal sigmoid colon and rectum; enlarged perirectal and low left retroperitoneal lymph nodes; multiple ill-defined hypodense liver lesions; trace ascites in the low pelvis.  02/08/2022 Colonoscopy by Dr. Charlyne Mom almost completely obstructing medium-sized mass in the rectosigmoid colon measuring from 12 to 17 cm.  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  Foundation 1-microsatellite stable, tumor mutation burden 5, [id number], K-ras wild-type 02/09/2022 CEA 3.2 CT chest 02/18/2022-chest adenopathy.  Multiple hypodense liver lesions. 02/20/2022 biopsy of liver lesion-fragments of benign liver parenchyma with reactive changes.  No evidence of malignancy. MRI abdomen 03/01/2022-numerous hypovascular hepatic lesions having imaging characteristics consider diagnostic of widespread metastatic disease to the liver.  Retroperitoneal lymphadenopathy concerning for metastatic disease. Cycle 1 FOLFOX 03/02/2022 Rectal bleeding, lower abdominal pain, constipation secondary to #1 Multiple family members with cancer Hypercholesterolemia Port-A-Cath placement 02/20/2022 Hospital admission 03/11/2022-colonic obstruction secondary to colon cancer. Neutropenia secondary to chemotherapy  Karen Kaufman has metastatic colorectal cancer and is now at day 11 following her first cycle of chemo.  She is now completely obstructed secondary to her colonic mass.  Has been seen by general surgery who is planning to bring her to the OR today for diverting loop colostomy.  We discussed the MRI of the abdomen results with the patient today.  These were not available to [place] at the time of her last visit.  We discussed that findings show diffuse liver metastasis.  Our plan is to continue with FOLFOX chemotherapy when she has recovered from  surgery.  She has severe neutropenia and day 11 following chemotherapy.  I recommend a daily CBC/differential.  She should have blood and urine cultures and begin broad-spectrum intravenous antibiotics for a fever.  There is concerned today that her Port-A-Cath has flipped.  We will obtain a chest x-ray to further evaluate.  If the Port-A-Cath has flipped, will ask IR to revise.  Recommendations: 1.  Diverting loop colostomy per general surgery. 2.  We will plan to continue outpatient FOLFOX when she recovers from surgery. 3.  Chest x-ray today to evaluate Port-A-Cath.  If flipped, will ask IR to revise. 4.  Daily CBC/differential 5.  Blood/urine cultures and broad-spectrum intravenous antibiotics for a fever spike  Mikey Bussing, NP   Ms. Sypher was interviewed and examined.  She is now day 11 following cycle 1 FOLFOX for treatment of metastatic rectal cancer.  I reviewed the staging abdomen MRI findings with her.  She is now admitted with a bowel obstruction secondary to the rectal tumor.  She will undergo a diverting colostomy today.  We will plan to resume chemotherapy when she has recovered from surgery.  We will consider escalating to FOLFOXIRI secondary to the bowel obstruction and BRAF nutation.  She has neutropenia secondary to chemotherapy.  She should begin broad-spectrum intravenous antibiotics for a fever or signs of infection.  I was present for greater than 50% of today's visit.  I performed medical decision making.  SUBJECTIVE: Ms. Fait is followed by our office for metastatic colon cancer.  She received her first cycle of FOLFOX on 03/02/2022.  Now admitted with colonic obstruction.  Has been seen by general surgery with plans to go to the OR later today for diverting loop colostomy.  This morning, she reports some ongoing abdominal pain.  She just had pain medication prior to my visit.  She is not reporting any nausea or vomiting.  IV nurse  at the bedside at the time my  visit trying to access Port-A-Cath.  Attempt was made x2 last evening and nursing could not access port.  IV nurse cannot locate the 3 "bumps" that can typically be felt on the Port-A-Cath and is concerned that Port-A-Cath has flipped.  Oncology History  Rectal cancer ([place])  02/09/2022 Initial Diagnosis   Rectal cancer ([place])    03/02/2022 -  Chemotherapy   Patient is on Treatment Plan : COLORECTAL FOLFOX q14d       PHYSICAL EXAMINATION:  Vitals:   03/12/22 0152 03/12/22 0549  BP: 135/78 140/80  Pulse: 98 99  Resp: 16 19  Temp: 98.1 F (36.7 C) 98.7 F (37.1 C)  SpO2: 96% 92%   There were no vitals filed for this visit.  Intake/Output from previous day: 05/24 0701 - 05/25 0700 In: 1328.2 [P.O.:60; I.V.:268.2; IV Piggyback:1000] Out: -   Physical Exam Vitals reviewed.  Constitutional:      General: She is not in acute distress. HENT:     Head: Normocephalic.     Mouth/Throat:     Pharynx: No oropharyngeal exudate or posterior oropharyngeal erythema.  Eyes:     General: No scleral icterus.    Conjunctiva/sclera: Conjunctivae normal.  Cardiovascular:     Rate and Rhythm: Regular rhythm.  Pulmonary:     Effort: Pulmonary effort is normal. No respiratory distress.  Abdominal:     Comments: Tinkling bowel sounds, mildly distended  Skin:    General: Skin is warm and dry.  Neurological:     Mental Status: She is alert and oriented to person, place, and time.    LABORATORY DATA:  I have reviewed the data as listed    Latest Ref Rng & Units 03/11/2022   10:57 AM 02/09/2022    1:27 AM 02/08/2022    3:00 PM  CMP  Glucose 70 - 99 mg/dL 102   103   95    BUN 8 - 23 mg/dL 9   <5   7    Creatinine 0.44 - 1.00 mg/dL 0.63   0.69   0.80    Sodium 135 - 145 mmol/L 133   141   142    Potassium 3.5 - 5.1 mmol/L 3.2   3.5   3.5    Chloride 98 - 111 mmol/L 94   111   106    CO2 22 - 32 mmol/L '27   26   23    ' Calcium 8.9 - 10.3 mg/dL 8.9   8.5   9.3    Total Protein 6.5 - 8.1  g/dL 6.9   5.8   6.4    Total Bilirubin 0.3 - 1.2 mg/dL 0.9   0.5   0.9    Alkaline Phos 38 - 126 U/L 121   86   92    AST 15 - 41 U/L '29   16   18    ' ALT 0 - 44 U/L '28   10   10      ' Lab Results  Component Value Date   WBC 3.2 (L) 03/11/2022   HGB 14.0 03/11/2022   HCT 40.1 03/11/2022   MCV 87.9 03/11/2022   PLT 206 03/11/2022   NEUTROABS 4.2 02/08/2022    Lab Results  Component Value Date   CEA1 3.2 02/09/2022    CT ABDOMEN PELVIS WO CONTRAST  Result Date: 03/11/2022 CLINICAL DATA:  Abdominal pain and bloating for several days. No bowel movement for 4 days.  History of metastatic rectal cancer. * Tracking Code: BO * EXAM: CT ABDOMEN AND PELVIS WITHOUT CONTRAST TECHNIQUE: Multidetector CT imaging of the abdomen and pelvis was performed following the standard protocol without IV contrast. RADIATION DOSE REDUCTION: This exam was performed according to the departmental dose-optimization program which includes automated exposure control, adjustment of the mA and/or kV according to patient size and/or use of iterative reconstruction technique. COMPARISON:  03/01/2022 MRI abdomen.  02/08/2022 CT abdomen/pelvis. FINDINGS: Lower chest: No acute abnormality at the lung bases. Mildly prominent 0.8 cm right pericardiophrenic lymph node (series 2/image 10), increased from 0.6 cm on 02/08/2022 CT. Partially visualized bilateral breast prostheses. Hepatobiliary: Several (greater than 5) ill-defined hypodense liver masses scattered throughout the liver, appearing mildly increased since 02/08/2022 CT. Representative 2.4 cm posterior right liver mass (series 2/image 17), increased from 1.8 cm. Central right liver 1.5 cm mass (series 2/image 12), mildly increased from 1.2 cm. Anterior left liver 1.3 cm mass (series 2/image 13), increased from 1.1 cm. Normal gallbladder with no radiopaque cholelithiasis. No biliary ductal dilatation. Pancreas: Normal, with no mass or duct dilation. Spleen: Normal size. No mass.  Adrenals/Urinary Tract: Normal adrenals. No renal stones. No hydronephrosis. Simple 1.4 cm medial interpolar right renal cyst, for which no follow-up is recommended. Otherwise no contour deforming renal masses. Normal bladder. Stomach/Bowel: Normal non-distended stomach. No dilated small bowel loops. Scattered air-fluid levels in the distal small bowel. No small bowel wall thickening. Mildly dilated fluid-filled appendix without appendiceal wall thickening or significant periappendiceal fat stranding. Annular wall thickening at the rectosigmoid junction involving an approximately 8 cm in length segment, proximal to which the colon is prominently distended with widespread colonic air-fluid levels. Cecal diameter 8.8 cm. Vascular/Lymphatic: Normal caliber abdominal aorta. Left para-lymphadenopathy up to 1.0 cm (series 2/image 36), increased from 0.7 cm. Multiple enlarged left perirectal nodes, largest 1.6 cm short axis diameter (series 2/image 65), stable. Reproductive: Status post hysterectomy, with no abnormal findings at the vaginal cuff. No adnexal mass. Other: No pneumoperitoneum. Small volume pelvic ascites. Presacral space ill-defined fluid and perirectal fat stranding is mildly increased. No focal fluid collection. Musculoskeletal: Moderate L1 vertebral compression fracture is new since 02/08/2022 CT, without definite discrete osseous lesions. Mild thoracolumbar spondylosis. IMPRESSION: 1. Distal large bowel obstruction at the site of a large annular mass at the rectosigmoid junction. 2. Progressive liver metastases. 3. Progressive perirectal, left para-aortic and right pericardiophrenic nodal metastases. 4. Small volume pelvic ascites. 5. Moderate L1 vertebral compression fracture, new since 02/08/2022 CT, without definite underlying osseous lesion. Electronically Signed   By: Ilona Sorrel M.D.   On: 03/11/2022 13:38   CT Chest Wo Contrast  Result Date: 02/18/2022 CLINICAL DATA:  A 61 year old female  presents for evaluation of rectal cancer, staging assessment. * Tracking Code: BO * EXAM: CT CHEST WITHOUT CONTRAST TECHNIQUE: Multidetector CT imaging of the chest was performed following the standard protocol without IV contrast. RADIATION DOSE REDUCTION: This exam was performed according to the departmental dose-optimization program which includes automated exposure control, adjustment of the mA and/or kV according to patient size and/or use of iterative reconstruction technique. COMPARISON:  Abdomen and pelvis CT from February 08, 2022. FINDINGS: Cardiovascular: Noncontrast appearance of the heart great vessels is unremarkable aside from minimal calcified coronary artery disease of LEFT coronary circulation. No pericardial effusion or thickening grossly. Central pulmonary vasculature is of normal caliber. Mediastinum/Nodes: Mildly irregular RIGHT posterior mediastinal lymph node measuring 8-9 mm (image 119/2) LEFT retrocrural lymph node un enlarged but seen in  the context of intra-aortocaval lymph node on image 148/2 that measures approximately 8 mm and in the setting of known or potential parental node involvement in the IMA region and scattered borderline LEFT periaortic lymph nodes. LEFT supraclavicular adenopathy (image 17/2) 11 mm Esophagus grossly normal. Lungs/Pleura: No suspicious pulmonary nodules. No consolidation. No pleural effusion. Airways are patent. Upper Abdomen: Multiple hypodense hepatic lesions suspicious for metastatic disease better characterized on dedicated abdominal CT. For instance, a lesion measuring as much as 2.5 cm on image 149/2 is noted with lesions scattered throughout the liver of similar size and smaller, greater than 20 lesions are suspected. Undo that Musculoskeletal: Spinal degenerative changes. No acute or destructive bone process. IMPRESSION: 1. Multiple hypodense hepatic lesions suspicious for metastatic disease better characterized on dedicated abdominal CT. 2. Signs of  adenopathy in the chest. PET imaging could be considered as warranted for further evaluation. 3. Minimal calcified coronary artery disease of LEFT coronary circulation. Electronically Signed   By: Zetta Bills M.D.   On: 02/18/2022 19:33   MR ABDOMEN WWO CONTRAST  Result Date: 03/03/2022 CLINICAL DATA:  61 year old female with history of rectal cancer with multiple liver lesions noted on prior CT examination. Follow-up evaluation to assess for potential metastatic disease to the liver. * Tracking Code: BO * EXAM: MRI ABDOMEN WITHOUT AND WITH CONTRAST TECHNIQUE: Multiplanar multisequence MR imaging of the abdomen was performed both before and after the administration of intravenous contrast. CONTRAST:  48m MULTIHANCE GADOBENATE DIMEGLUMINE 529 MG/ML IV SOLN COMPARISON:  No prior abdominal MRI. CT the chest, CT the abdomen and pelvis 02/08/2022. FINDINGS: Lower chest: Bilateral breast implants are incidentally noted. Hepatobiliary: Multiple hepatic lesions are scattered throughout all aspects of the liver, which are T1 hypointense, mildly T2 hyperintense, demonstrate diffusion restriction and appear hypovascular on post gadolinium images, compatible with widespread metastatic disease to the liver. The largest of these is in segment 8 (axial image 30 of series 13) measuring 1.9 x 1.4 cm. No intra or extrahepatic biliary ductal dilatation. Gallbladder is unremarkable in appearance. Pancreas: No pancreatic mass. No pancreatic ductal dilatation. No pancreatic or peripancreatic fluid collections or inflammatory changes. Spleen:  Unremarkable. Adrenals/Urinary Tract: In the medial aspect of the interpolar region of the right kidney there is a 1.5 cm lesion which is T1 hypointense, T2 hyperintense, and does not enhance, compatible with a simple cyst. Left kidney and bilateral adrenal glands are normal in appearance. No hydroureteronephrosis in the visualized portions of the abdomen. Bilateral adrenal glands are normal  in appearance. Stomach/Bowel: Visualized portions are unremarkable. Vascular/Lymphatic: No aneurysm identified in the visualized abdominal vasculature. Multiple mildly enlarged and borderline enlarged para-aortic lymph nodes measuring up to 1.2 cm in short axis (axial image 65 of series 13) on the left. Other: No significant volume of ascites noted in the visualized portions of the peritoneal cavity. Musculoskeletal: No definite aggressive appearing osseous lesions are noted in the visualized portions of the skeleton. IMPRESSION: 1. Numerous hypovascular hepatic lesions have imaging characteristics considered diagnostic of widespread metastatic disease to the liver. There is also retroperitoneal lymphadenopathy concerning for metastatic disease, as above. Electronically Signed   By: DVinnie LangtonM.D.   On: 03/03/2022 08:16   IR [place] Bx Asp/Drain  Result Date: 02/20/2022 INDICATION: History of rectal carcinoma. Liver lesion noted on recent CT. EXAM: ULTRASOUND-GUIDED CORE LIVER LESION BIOPSY MEDICATIONS: No periprocedural antibiotics were indicated. ANESTHESIA/SEDATION: Intravenous Fentanyl 1527m and Versed 97m26mere administered as conscious sedation during continuous monitoring of the patient's level of  consciousness and physiological / cardiorespiratory status by the radiology RN, with a total moderate sedation time of 38 minutes. COMPLICATIONS: None immediate. PROCEDURE: Informed written consent was obtained from the patient after a thorough discussion of the procedural risks, benefits and alternatives. All questions were addressed. Maximal Sterile Barrier Technique was utilized including caps, mask, sterile gowns, sterile gloves, sterile drape, hand hygiene and skin antiseptic. A timeout was performed prior to the initiation of the procedure. Survey ultrasound of the right lobe of the liver was performed. A representative lesion was localized, and appropriate skin entry site was determined and marked.  Skin was prepped with chlorhexidine, draped in usual sterile fashion, infiltrated locally with 1% lidocaine. Under real-time ultrasound guidance, 17 gauge trocar needle advanced to the margin of the lesion. Multiple coaxial 18 gauge core biopsy samples were obtained under real-time ultrasound guidance. The guide needle was removed. Postprocedure scan shows no hemorrhage or other apparent complication. The patient tolerated the procedure well. IMPRESSION: 1. Technically successful ultrasound-guided core biopsy, liver lesion. Electronically Signed   By: Lucrezia Europe M.D.   On: 02/20/2022 16:24   IR IMAGING GUIDED PORT INSERTION  Result Date: 02/20/2022 CLINICAL DATA:  Rectal carcinoma with liver lesions. Needs durable venous access for planned treatment regimen. EXAM: TUNNELED PORT CATHETER PLACEMENT WITH ULTRASOUND AND FLUOROSCOPIC GUIDANCE FLUOROSCOPY: Radiation Exposure Index (as provided by the fluoroscopic device): 1 mGy air Kerma ANESTHESIA/SEDATION: Intravenous Fentanyl 74mg and Versed 12mwere administered as conscious sedation during continuous monitoring of the patient's level of consciousness and physiological / cardiorespiratory status by the radiology RN, with a total moderate sedation time of 20 minutes. TECHNIQUE: The procedure, risks, benefits, and alternatives were explained to the patient. Questions regarding the procedure were encouraged and answered. The patient understands and consents to the procedure. Patency of the right IJ vein was confirmed with ultrasound with image documentation. An appropriate skin site was determined. Skin site was marked. Region was prepped using maximum barrier technique including cap and mask, sterile gown, sterile gloves, large sterile sheet, and Chlorhexidine as cutaneous antisepsis. The region was infiltrated locally with 1% lidocaine. Under real-time ultrasound guidance, the right IJ vein was accessed with a 21 gauge micropuncture needle; the needle tip within  the vein was confirmed with ultrasound image documentation. Needle was exchanged over a 018 guidewire for transitional dilator, and vascular measurement was performed. A small incision was made on the right anterior chest wall and a subcutaneous pocket fashioned. The power-injectable port was positioned and its catheter tunneled to the right IJ dermatotomy site. The transitional dilator was exchanged over an Amplatz wire for a peel-away sheath, through which the port catheter, which had been trimmed to the appropriate length, was advanced and positioned under fluoroscopy with its tip at the cavoatrial junction. Spot chest radiograph confirms good catheter position and no pneumothorax. The port was flushed per protocol. The pocket was closed with deep interrupted and subcuticular continuous 3-0 Monocryl sutures. The incisions were covered with Dermabond then covered with a sterile dressing. The patient tolerated the procedure well. COMPLICATIONS: COMPLICATIONS None immediate IMPRESSION: Technically successful right IJ power-injectable port catheter placement. Ready for routine use. Electronically Signed   By: D Lucrezia Europe.D.   On: 02/20/2022 16:24     Future Appointments  Date Time Provider [place]2023  7:45 AM [facility] PHLEBOTOMIST [facility] None  03/17/2022  8:00 AM [facility] FLUSH ROOM [facility] None  03/17/2022  8:20 AM ShLadell PierMD [facility] None  03/17/2022  9:15 AM  [facility] CHAIR 1 [facility] None  03/19/2022  1:30 PM [facility] FLUSH ROOM [facility] None  03/31/2022  8:30 AM [facility] PHLEBOTOMIST [facility] None  03/31/2022  8:45 AM [facility] FLUSH ROOM [facility] None  03/31/2022  9:15 AM Owens Shark, NP [facility] None  03/31/2022 10:15 AM [facility] CHAIR 2 [facility] None  04/02/2022  1:15 PM [facility] FLUSH ROOM [facility] None  05/15/2022  3:20 PM Jaquita Folds, MD [facility]      LOS: 1 day

## 2022-03-12 NOTE — Consult Note (Signed)
[place] Nurse requested for preoperative stoma site marking  Discussed surgical procedure and stoma creation with patient.  Explained role of the [place] nurse team.  Provided the patient with educational booklet and provided samples of pouching options.  Answered patient questions.   Examined patient lying and sitting in order to place the marking in the patient's visual field, away from any creases or abdominal contour issues and within the rectus muscle.  Due to abdominal distention, patient is not able to visualize a stoma below her umbilicus today.  She wears her pants at the level of umbilicus.  We have marked her above and below umbilicus in areas free of creasing or abdominal contour that may impede pouch seal.   Marked for colostomy in the LUQ  4 cm to the left of the umbilicus and 2 cm above the umbilicus.  Marked for colostomy in the LLQ  4 cm to the right of the umbilicus and  2 cm below the umbilicus if needed.  At this time, patient has difficulty visualizing this site.  If distention resolves, this location would be fine.   Patient's abdomen cleansed with CHG wipes at site markings, allowed to air dry prior to marking.Covered mark with thin film transparent dressing to preserve mark until date of surgery.   [place] Nurse team will follow up with patient after surgery for continue ostomy care and teaching.   Domenic Moras MSN, RN, FNP-BC [facility]ON Wound, Ostomy, Continence Nurse Pager [phone]

## 2022-03-12 NOTE — Progress Notes (Signed)
  Transition of Care (TOC) Screening Note   Patient Details  Name: Karen Kaufman Date of Birth: [birth year]   Transition of Care [facility]) CM/SW Contact:    Kiwan Gadsden, Marjie Skiff, RN Phone Number: 03/12/2022, 11:21 AM    Transition of Care Department [facility]) has reviewed patient and no TOC needs have been identified at this time. We will continue to monitor patient advancement through interdisciplinary progression rounds. If new patient transition needs arise, please place a TOC consult.

## 2022-03-12 NOTE — Anesthesia Procedure Notes (Signed)
Procedure Name: Intubation Date/Time: 03/12/2022 10:29 AM Performed by: Deliah Boston, CRNA Pre-anesthesia Checklist: Patient identified, Emergency Drugs available, Suction available and Patient being monitored Patient Re-evaluated:Patient Re-evaluated prior to induction Oxygen Delivery Method: Circle system utilized Preoxygenation: Pre-oxygenation with 100% oxygen Induction Type: IV induction, Rapid sequence and Cricoid Pressure applied Laryngoscope Size: Mac and 3 Grade View: Grade II Tube type: Oral Tube size: 7.0 mm Number of attempts: 1 Airway Equipment and Method: Stylet Placement Confirmation: ETT inserted through vocal cords under direct vision, positive ETCO2 and breath sounds checked- equal and bilateral Secured at: 21 cm Tube secured with: Tape Dental Injury: Teeth and Oropharynx as per pre-operative assessment

## 2022-03-12 NOTE — Anesthesia Postprocedure Evaluation (Signed)
Anesthesia Post Note  Patient: Karen Kaufman  Procedure(s) Performed: LAPAROSCOPIC ASSISTED DIVERTING COLOSTOMY (Abdomen)     Patient location during evaluation: PACU Anesthesia Type: General Level of consciousness: awake and alert Pain management: pain level controlled Vital Signs Assessment: post-procedure vital signs reviewed and stable Respiratory status: spontaneous breathing, nonlabored ventilation, respiratory function stable and patient connected to nasal cannula oxygen Cardiovascular status: blood pressure returned to baseline and stable Postop Assessment: no apparent nausea or vomiting Anesthetic complications: no   No notable events documented.  Last Vitals:  Vitals:   03/12/22 0152 03/12/22 0549  BP: 135/78 140/80  Pulse: 98 99  Resp: 16 19  Temp: 36.7 C 37.1 C  SpO2: 96% 92%    Last Pain:  Vitals:   03/12/22 0900  TempSrc:   PainSc: Asleep                 Loriana Samad S

## 2022-03-12 NOTE — Progress Notes (Signed)
Initial Nutrition Assessment  INTERVENTION:   -Needs updated weight for admission (last recorded 5/15)  Once diet advanced: -Ensure Plus High Protein po BID, each supplement provides 350 kcal and 20 grams of protein.  -Multivitamin with minerals daily  NUTRITION DIAGNOSIS:   Increased nutrient needs related to cancer and cancer related treatments as evidenced by estimated needs.  GOAL:   Patient will meet greater than or equal to 90% of their needs  MONITOR:   Diet advancement, Labs, Weight trends, I & O's  REASON FOR ASSESSMENT:   Malnutrition Screening Tool    ASSESSMENT:   61 y.o. female with medical history significant of colorectal cancer, HLD. Presenting with abdominal pain and bloating. She had a fairly recent diagnosis of colorectal cancer. She started chemo last week. Admitted for Colonic obstruction secondary to colorectal cancer and plans for surgery.  Patient currently in OR for scheduled diverting colostomy. Per chart review, pt has been subsisting on full liquids with some mashed potatoes and cream of wheat. Was trying to drink Ensure at home.  Started chemotherapy on 5/15 for metastatic colorectal cancer. Followed by [place] [facility], last seen 5/3.  Pt began have N/V 4 days PTA. Has been constipated.  H/o Vitamin D and Vitamin B-12 deficiency. Per home meds list, pt takes these supplements.   Will order Ensure supplements following diet advancement.   UBW: ~156 lbs. Per weight records, last recorded weight from 5/15: 143 lbs. Needs updated weight for this admission.  Medications reviewed.  Labs reviewed:  Low Na  NUTRITION - FOCUSED PHYSICAL EXAM:  Pt in OR  Diet Order:   Diet Order             Diet NPO time specified  Diet effective midnight                   EDUCATION NEEDS:   Not appropriate for education at this time  Skin:  Skin Assessment: Skin Integrity Issues: Skin Integrity Issues:: Incisions Incisions: 5/5: right  abdomen, right chest, right neck  5/25: abdomen  Last BM:  5/20  Height:   Ht Readings from Last 1 Encounters:  02/27/22 '5\' 6"'$  (1.676 m)    Weight:   Wt Readings from Last 1 Encounters:  03/02/22 65.3 kg    BMI:  There is no height or weight on file to calculate BMI.  Estimated Nutritional Needs:   Kcal:  1800-2000  Protein:  85-100g  Fluid:  2L/day   Clayton Bibles, MS, RD, LDN Inpatient Clinical Dietitian Contact information available via Amion

## 2022-03-12 NOTE — Interval H&P Note (Signed)
History and Physical Interval Note:  03/12/2022 10:10 AM  Karen Kaufman  has presented today for surgery, with the diagnosis of [place].  The various methods of treatment have been discussed with the patient and family. After consideration of risks, benefits and other options for treatment, the patient has consented to  Procedure(s): LAPAROSCOPIC ASSISTED DIVERTING COLOSTOMY (N/A) as a surgical intervention.  The patient's history has been reviewed, patient examined, no change in status, stable for surgery.  I have reviewed the patient's chart and labs.  Questions were answered to the patient's satisfaction.    I have re-reviewed the the patient's records, history, medications, and allergies.  I have re-examined the patient.  I again discussed intraoperative plans and goals of post-operative recovery.  The patient agrees to proceed.  Karen Kaufman  August 25, 1961 [id number]  Patient Care Team: Gwenlyn Perking, FNP as PCP - General (Family Medicine) Algie Coffer, Mindi Slicker, RN as Oncology Nurse Navigator Ladell Pier, MD as Consulting Physician (Oncology) Daryel November, MD as Consulting Physician (Gastroenterology)  Patient Active Problem List   Diagnosis Date Noted   Rectal cancer [facility]) 02/09/2022    Priority: High   Metastatic colorectal cancer to liver ([place]) 03/11/2022   Colonic obstruction ([place]) 03/11/2022   Hypokalemia 03/11/2022   Hyponatremia 03/11/2022   Closed compression fracture of body of L1 vertebra ([place]) 03/11/2022   Insomnia 02/24/2022   Menopausal symptom 02/24/2022   Hematochezia    Abnormal CT scan, colon    Benign neoplasm of transverse colon    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    LLQ abdominal pain    Rectal bleed 02/08/2022   Chronic constipation 01/07/2022   Hypercholesterolemia 11/14/2020    Past Medical History:  Diagnosis Date   Allergy    Cancer ([place])    skin cancer removed by dermatology    Cataract    Hyperlipidemia     Past Surgical History:  Procedure Laterality Date   ABDOMINAL HYSTERECTOMY  2002   BOWEL DECOMPRESSION N/A 02/09/2022   Procedure: BOWEL DECOMPRESSION;  Surgeon: Ladene Artist, MD;  Location: [place];  Service: Gastroenterology;  Laterality: N/A;   [place]   craniotomy-removal of tumor from behind right eye   BREAST SURGERY  2003   breast implants   COLONOSCOPY WITH PROPOFOL N/A 02/09/2022   Procedure: COLONOSCOPY WITH PROPOFOL;  Surgeon: Ladene Artist, MD;  Location: [place];  Service: Gastroenterology;  Laterality: N/A;   EYE SURGERY     bilateral cataract removal   FLEXIBLE SIGMOIDOSCOPY N/A 02/09/2022   Procedure: FLEXIBLE SIGMOIDOSCOPY;  Surgeon: Ladene Artist, MD;  Location: [place];  Service: Gastroenterology;  Laterality: N/A;   IR IMAGING GUIDED PORT INSERTION  02/20/2022   IR US GUIDE BX ASP/DRAIN  02/20/2022   POLYPECTOMY  02/09/2022   Procedure: POLYPECTOMY;  Surgeon: Ladene Artist, MD;  Location: [facility] ENDOSCOPY;  Service: Gastroenterology;;    Social History   Socioeconomic History   Marital status: Married    Spouse name: Not on file   Number of children: 2   Years of education: 13   Highest education level: Some college, no degree  Occupational History   Occupation: retired  Tobacco Use   Smoking status: Never   Smokeless tobacco: Never  Vaping Use   Vaping Use: Never used  Substance and Sexual Activity   Alcohol use: Not Currently   Drug use: Not Currently   Sexual activity: Yes  Birth control/protection: Surgical  Other Topics Concern   Not on file  Social History Narrative   Not on file   Social Determinants of Health   Financial Resource Strain: Low Risk    Difficulty of Paying Living Expenses: Not very hard  Food Insecurity: No Food Insecurity   Worried About Charity fundraiser in the Last Year: Never true   Ran Out of Food in the Last Year: Never true  Transportation Needs:  No Transportation Needs   Lack of Transportation (Medical): No   Lack of Transportation (Non-Medical): No  Physical Activity: Insufficiently Active   Days of Exercise per Week: 3 days   Minutes of Exercise per Session: 20 min  Stress: No Stress Concern Present   Feeling of Stress : Only a little  Social Connections: Engineer, building services of Communication with Friends and Family: Three times a week   Frequency of Social Gatherings with Friends and Family: Three times a week   Attends Religious Services: 1 to 4 times per year   Active Member of Clubs or Organizations: Yes   Attends Archivist Meetings: 1 to 4 times per year   Marital Status: Married  Human resources officer Violence: Not At Risk   Fear of Current or Ex-Partner: No   Emotionally Abused: No   Physically Abused: No   Sexually Abused: No    Family History  Problem Relation Age of Onset   Cancer Mother        breast   Diabetes Mother    Hyperlipidemia Mother    Hypertension Mother    Cancer Father        prostate   Arthritis Maternal Grandmother    Arthritis Maternal Grandfather    Cancer Maternal Grandfather        hodgkins lymphoma   Arthritis Paternal Grandmother    Arthritis Paternal Grandfather    Diabetes Paternal Grandfather    Colon cancer Neg Hx    Pancreatic cancer Neg Hx    Esophageal cancer Neg Hx     Medications Prior to Admission  Medication Sig Dispense Refill Last Dose   atorvastatin (LIPITOR) 20 MG tablet Take 1 tablet (20 mg total) by mouth daily. 90 tablet 3 03/10/2022   dicyclomine (BENTYL) 10 MG capsule Take 2 capsules (20 mg total) by mouth every 6 (six) hours as needed for spasms (cramping). 30 capsule 0 Past Month   docusate sodium (COLACE) 100 MG capsule Take 200 mg by mouth daily.   03/10/2022   HYDROcodone-acetaminophen (NORCO) 5-325 MG tablet Take 1-2 tablets by mouth every 6 (six) hours as needed for moderate pain. 60 tablet 0 03/10/2022   magnesium oxide (MAG-OX) 400  MG tablet Take 400 mg by mouth daily.   03/10/2022   ondansetron (ZOFRAN) 8 MG tablet Take 1 tablet (8 mg total) by mouth every 8 (eight) hours as needed for nausea or vomiting. Do not take until 72 hours after chemo 20 tablet 0 03/10/2022   polyethylene glycol (MIRALAX / GLYCOLAX) 17 g packet Take 17 g by mouth 2 (two) times daily. 60 packet 0 03/10/2022   prochlorperazine (COMPAZINE) 10 MG tablet Take 1 tablet (10 mg total) by mouth every 6 (six) hours as needed for nausea or vomiting. 30 tablet 2 Past Week   simethicone (MYLICON) 80 MG chewable tablet Chew 80 mg by mouth every 6 (six) hours as needed for flatulence.   03/11/2022   vitamin B-12 (CYANOCOBALAMIN) 1000 MCG tablet Take 1 tablet (1,000  mcg total) by mouth daily. 90 tablet 1 03/10/2022   lidocaine-prilocaine (EMLA) cream Apply to port site 1-2 hours prior to use 30 g 2    Vitamin D, Ergocalciferol, (DRISDOL) 1.25 MG (50000 UNIT) CAPS capsule Take 1 capsule (50,000 Units total) by mouth every 7 (seven) days. (Patient not taking: Reported on 02/08/2022) 12 capsule 0     Current Facility-Administered Medications  Medication Dose Route Frequency Provider Last Rate Last Admin   0.9 %  sodium chloride infusion  1,000 mL Intravenous Continuous Marylyn Ishihara, Tyrone A, DO 125 mL/hr at 03/11/22 2133 1,000 mL at 03/11/22 2133   acetaminophen (OFIRMEV) 10 MG/ML IV            [MAR Hold] acetaminophen (TYLENOL) tablet 1,000 mg  1,000 mg Oral On Call to [place], Tyrone A, DO       [MAR Hold] alum & mag hydroxide-simeth (MAALOX/MYLANTA) 200-200-20 MG/5ML suspension 30 mL  30 mL Oral Q6H PRN Marylyn Ishihara, Tyrone A, DO   30 mL at 03/12/22 0044   [MAR Hold] alvimopan (ENTEREG) capsule 12 mg  12 mg Oral On Call to [place], Tyrone A, DO       [MAR Hold] bupivacaine liposome (EXPAREL) 1.3 % injection 266 mg  20 mL Infiltration On Call to [place], Tyrone A, DO       [MAR Hold] cefoTEtan (CEFOTAN) 2 g in sodium chloride 0.9 % 100 mL IVPB  2 g Intravenous On Call to [place], Tyrone  A, DO       [MAR Hold] celecoxib (CELEBREX) capsule 200 mg  200 mg Oral On Call to [place], Tyrone A, DO       [MAR Hold] diphenhydrAMINE (BENADRYL) injection 12.5-25 mg  12.5-25 mg Intravenous Q6H PRN Marylyn Ishihara, Tyrone A, DO       droperidol (INAPSINE) 2.5 MG/ML injection            [MAR Hold] enoxaparin (LOVENOX) injection 40 mg  40 mg Subcutaneous On Call to OR Marylyn Ishihara, Tyrone A, DO       [MAR Hold] feeding supplement (ENSURE PRE-SURGERY) liquid 296 mL  296 mL Oral Once Marylyn Ishihara, Tyrone A, DO       [MAR Hold] feeding supplement (ENSURE PRE-SURGERY) liquid 592 mL  592 mL Oral Once Marylyn Ishihara, Tyrone A, DO       [MAR Hold] gabapentin (NEURONTIN) capsule 300 mg  300 mg Oral On Call to OR Marylyn Ishihara, Tyrone A, DO       [MAR Hold] HYDROmorphone (DILAUDID) injection 0.5-2 mg  0.5-2 mg Intravenous Q2H PRN Marylyn Ishihara, Tyrone A, DO       [MAR Hold] lactated ringers bolus 1,000 mL  1,000 mL Intravenous Q8H PRN [place], Tyrone A, DO       [MAR Hold] lip balm (CARMEX) ointment 1 application.  1 application. Topical BID Marylyn Ishihara, Tyrone A, DO   1 application. at 03/11/22 2130   [MAR Hold] magic mouthwash  15 mL Oral QID PRN Marylyn Ishihara, Tyrone A, DO       [MAR Hold] menthol-cetylpyridinium (CEPACOL) lozenge 3 mg  1 lozenge Oral PRN Marylyn Ishihara, Tyrone A, DO       [MAR Hold] methocarbamol (ROBAXIN) 1,000 mg in dextrose 5 % 100 mL IVPB  1,000 mg Intravenous Q6H PRN Marylyn Ishihara, Tyrone A, DO       neomycin (MYCIFRADIN) tablet 1,000 mg  1,000 mg Oral 3 times per day Cherylann Ratel A, DO   1,000 mg at 03/11/22 2127   And   metroNIDAZOLE (FLAGYL)  tablet 1,000 mg  1,000 mg Oral 3 times per day Cherylann Ratel A, DO   1,000 mg at 03/11/22 2127   [facility] Hold] morphine (PF) 2 MG/ML injection 2-4 mg  2-4 mg Intravenous Q3H PRN Marylyn Ishihara, Tyrone A, DO   4 mg at 03/12/22 0739   [MAR Hold] ondansetron (ZOFRAN) injection 4 mg  4 mg Intravenous Q6H PRN Marylyn Ishihara, Tyrone A, DO       Or   [MAR Hold] ondansetron (ZOFRAN) 8 mg in sodium chloride 0.9 % 50 mL IVPB  8 mg Intravenous Q6H PRN Marylyn Ishihara,  Tyrone A, DO       [MAR Hold] phenol (CHLORASEPTIC) mouth spray 2 spray  2 spray Mouth/Throat PRN Marylyn Ishihara, Tyrone A, DO       [MAR Hold] prochlorperazine (COMPAZINE) injection 5-10 mg  5-10 mg Intravenous Q4H PRN Marylyn Ishihara, Tyrone A, DO   10 mg at 03/11/22 1616   scopolamine (TRANSDERM-SCOP) 1 MG/3DAYS            [MAR Hold] simethicone (MYLICON) 40 QA/8.3MH suspension 80 mg  80 mg Oral QID PRN Marylyn Ishihara, Tyrone A, DO         Allergies  Allergen Reactions   Iodine Anaphylaxis   Shellfish Allergy Anaphylaxis   Hydromorphone     "Burning Sensation"    BP 140/80 (BP Location: Left Arm)   Pulse 99   Temp 98.7 F (37.1 C) (Oral)   Resp 19   SpO2 92%   Labs: Results for orders placed or performed during the hospital encounter of 03/11/22 (from the past 48 hour(s))  Lipase, blood     Status: None   Collection Time: 03/11/22 10:57 AM  Result Value Ref Range   Lipase 24 11 - 51 U/L    Comment: Performed at [facility], [place] [address], [place], [place] [address]  Comprehensive metabolic panel     Status: Abnormal   Collection Time: 03/11/22 10:57 AM  Result Value Ref Range   Sodium 133 (L) 135 - 145 mmol/L   Potassium 3.2 (L) 3.5 - 5.1 mmol/L   Chloride 94 (L) 98 - 111 mmol/L   CO2 27 22 - 32 mmol/L   Glucose, Bld 102 (H) 70 - 99 mg/dL    Comment: Glucose reference range applies only to samples taken after fasting for at least 8 hours.   BUN 9 8 - 23 mg/dL   Creatinine, Ser 0.63 0.44 - 1.00 mg/dL   Calcium 8.9 8.9 - 10.3 mg/dL   Total Protein 6.9 6.5 - 8.1 g/dL   Albumin 3.7 3.5 - 5.0 g/dL   AST 29 15 - 41 U/L   ALT 28 0 - 44 U/L   Alkaline Phosphatase 121 38 - 126 U/L   Total Bilirubin 0.9 0.3 - 1.2 mg/dL   GFR, Estimated >60 >60 mL/min    Comment: (NOTE) Calculated using the CKD-EPI Creatinine Equation (2021)    Anion gap 12 5 - 15    Comment: Performed at [facility], [place] [address], [place], [place] [address]  CBC     Status: Abnormal    Collection Time: 03/11/22 10:57 AM  Result Value Ref Range   WBC 3.2 (L) 4.0 - 10.5 K/uL   RBC 4.56 3.87 - 5.11 MIL/uL   Hemoglobin 14.0 12.0 - 15.0 g/dL   HCT 40.1 36.0 - 46.0 %   MCV 87.9 80.0 - 100.0 fL   MCH 30.7 26.0 - 34.0 pg   MCHC 34.9 30.0 -  36.0 g/dL   RDW 11.4 (L) 11.5 - 15.5 %   Platelets 206 150 - 400 K/uL   nRBC 0.0 0.0 - 0.2 %    Comment: Performed at [facility], [place] [address] [place], [place] [address]  Hemoglobin A1c     Status: None   Collection Time: 03/11/22 10:57 AM  Result Value Ref Range   Hgb A1c MFr Bld 5.5 4.8 - 5.6 %    Comment: (NOTE) Pre diabetes:          5.7%-6.4%  Diabetes:              >6.4%  Glycemic control for   <7.0% adults with diabetes    Mean Plasma Glucose 111.15 mg/dL    Comment: Performed at [place] [address], [place], [place] [address]  Magnesium     Status: None   Collection Time: 03/11/22 10:57 AM  Result Value Ref Range   Magnesium 2.3 1.7 - 2.4 mg/dL    Comment: Performed at [facility], [place] [address] [place], [place] [address]  Urinalysis, Routine w reflex microscopic Urine, Clean Catch     Status: Abnormal   Collection Time: 03/11/22 11:16 AM  Result Value Ref Range   Color, Urine YELLOW YELLOW   APPearance CLEAR CLEAR   Specific Gravity, Urine 1.020 1.005 - 1.030   pH 5.0 5.0 - 8.0   Glucose, UA NEGATIVE NEGATIVE mg/dL   Hgb urine dipstick NEGATIVE NEGATIVE   Bilirubin Urine NEGATIVE NEGATIVE   Ketones, ur 80 (A) NEGATIVE mg/dL   Protein, ur NEGATIVE NEGATIVE mg/dL   Nitrite NEGATIVE NEGATIVE   Leukocytes,Ua NEGATIVE NEGATIVE   RBC / HPF 0-5 0 - 5 RBC/hpf   WBC, UA 0-5 0 - 5 WBC/hpf   Bacteria, UA NONE SEEN NONE SEEN   Squamous Epithelial / LPF 0-5 0 - 5   Mucus PRESENT     Comment: Performed at [facility], [place] [address] [place], [place] [address]  Comprehensive metabolic panel     Status: Abnormal   Collection Time: 03/12/22   9:21 AM  Result Value Ref Range   Sodium 133 (L) 135 - 145 mmol/L   Potassium 3.5 3.5 - 5.1 mmol/L   Chloride 101 98 - 111 mmol/L   CO2 23 22 - 32 mmol/L   Glucose, Bld 110 (H) 70 - 99 mg/dL    Comment: Glucose reference range applies only to samples taken after fasting for at least 8 hours.   BUN 12 8 - 23 mg/dL   Creatinine, Ser 0.59 0.44 - 1.00 mg/dL   Calcium 8.3 (L) 8.9 - 10.3 mg/dL   Total Protein 6.0 (L) 6.5 - 8.1 g/dL   Albumin 3.1 (L) 3.5 - 5.0 g/dL   AST 30 15 - 41 U/L   ALT 30 0 - 44 U/L   Alkaline Phosphatase 125 38 - 126 U/L   Total Bilirubin 1.1 0.3 - 1.2 mg/dL   GFR, Estimated >60 >60 mL/min    Comment: (NOTE) Calculated using the CKD-EPI Creatinine Equation (2021)    Anion gap 9 5 - 15    Comment: Performed at [facility], [place] [address], [place], [place] [address]  CBC     Status: Abnormal   Collection Time: 03/12/22  9:21 AM  Result Value Ref Range   WBC 1.0 (LL) 4.0 - 10.5 K/uL    Comment: REPEATED TO VERIFY THIS CRITICAL RESULT HAS VERIFIED AND  BEEN CALLED TO SMITH,M. RN BY NICOLE MCCOY ON 05 25 2023 AT 1006, AND HAS BEEN READ BACK. CRITICAL RESULT VERIFIED    RBC 4.26 3.87 - 5.11 MIL/uL   Hemoglobin 12.9 12.0 - 15.0 g/dL   HCT 37.1 36.0 - 46.0 %   MCV 87.1 80.0 - 100.0 fL   MCH 30.3 26.0 - 34.0 pg   MCHC 34.8 30.0 - 36.0 g/dL   RDW 11.4 (L) 11.5 - 15.5 %   Platelets 220 150 - 400 K/uL   nRBC 0.0 0.0 - 0.2 %    Comment: Performed at [facility], [place] [address], [place], [place] [address]    Imaging / Studies: CT ABDOMEN PELVIS WO CONTRAST  Result Date: 03/11/2022 CLINICAL DATA:  Abdominal pain and bloating for several days. No bowel movement for 4 days. History of metastatic rectal cancer. * Tracking Code: BO * EXAM: CT ABDOMEN AND PELVIS WITHOUT CONTRAST TECHNIQUE: Multidetector CT imaging of the abdomen and pelvis was performed following the standard protocol without IV contrast. RADIATION DOSE REDUCTION: This  exam was performed according to the departmental dose-optimization program which includes automated exposure control, adjustment of the mA and/or kV according to patient size and/or use of iterative reconstruction technique. COMPARISON:  03/01/2022 MRI abdomen.  02/08/2022 CT abdomen/pelvis. FINDINGS: Lower chest: No acute abnormality at the lung bases. Mildly prominent 0.8 cm right pericardiophrenic lymph node (series 2/image 10), increased from 0.6 cm on 02/08/2022 CT. Partially visualized bilateral breast prostheses. Hepatobiliary: Several (greater than 5) ill-defined hypodense liver masses scattered throughout the liver, appearing mildly increased since 02/08/2022 CT. Representative 2.4 cm posterior right liver mass (series 2/image 17), increased from 1.8 cm. Central right liver 1.5 cm mass (series 2/image 12), mildly increased from 1.2 cm. Anterior left liver 1.3 cm mass (series 2/image 13), increased from 1.1 cm. Normal gallbladder with no radiopaque cholelithiasis. No biliary ductal dilatation. Pancreas: Normal, with no mass or duct dilation. Spleen: Normal size. No mass. Adrenals/Urinary Tract: Normal adrenals. No renal stones. No hydronephrosis. Simple 1.4 cm medial interpolar right renal cyst, for which no follow-up is recommended. Otherwise no contour deforming renal masses. Normal bladder. Stomach/Bowel: Normal non-distended stomach. No dilated small bowel loops. Scattered air-fluid levels in the distal small bowel. No small bowel wall thickening. Mildly dilated fluid-filled appendix without appendiceal wall thickening or significant periappendiceal fat stranding. Annular wall thickening at the rectosigmoid junction involving an approximately 8 cm in length segment, proximal to which the colon is prominently distended with widespread colonic air-fluid levels. Cecal diameter 8.8 cm. Vascular/Lymphatic: Normal caliber abdominal aorta. Left para-lymphadenopathy up to 1.0 cm (series 2/image 36), increased  from 0.7 cm. Multiple enlarged left perirectal nodes, largest 1.6 cm short axis diameter (series 2/image 65), stable. Reproductive: Status post hysterectomy, with no abnormal findings at the vaginal cuff. No adnexal mass. Other: No pneumoperitoneum. Small volume pelvic ascites. Presacral space ill-defined fluid and perirectal fat stranding is mildly increased. No focal fluid collection. Musculoskeletal: Moderate L1 vertebral compression fracture is new since 02/08/2022 CT, without definite discrete osseous lesions. Mild thoracolumbar spondylosis. IMPRESSION: 1. Distal large bowel obstruction at the site of a large annular mass at the rectosigmoid junction. 2. Progressive liver metastases. 3. Progressive perirectal, left para-aortic and right pericardiophrenic nodal metastases. 4. Small volume pelvic ascites. 5. Moderate L1 vertebral compression fracture, new since 02/08/2022 CT, without definite underlying osseous lesion. Electronically Signed   By: Ilona Sorrel M.D.   On: 03/11/2022 13:38   DG Chest 1 View  Result Date: 03/12/2022 CLINICAL DATA:  Port-A-Cath placement EXAM: CHEST  1 VIEW COMPARISON:  Portable exam 0851 hours without priors for comparison FINDINGS: RIGHT jugular Port-A-Cath with tip projecting over SVC. Normal heart size, mediastinal contours, and pulmonary vascularity. Lungs clear. No infiltrate, pleural effusion, or pneumothorax. Bones appear demineralized. IMPRESSION: No pneumothorax following RIGHT jugular Port-A-Cath insertion. Electronically Signed   By: Lavonia Dana M.D.   On: 03/12/2022 09:54   CT Chest Wo Contrast  Result Date: 02/18/2022 CLINICAL DATA:  A 61 year old female presents for evaluation of rectal cancer, staging assessment. * Tracking Code: BO * EXAM: CT CHEST WITHOUT CONTRAST TECHNIQUE: Multidetector CT imaging of the chest was performed following the standard protocol without IV contrast. RADIATION DOSE REDUCTION: This exam was performed according to the departmental  dose-optimization program which includes automated exposure control, adjustment of the mA and/or kV according to patient size and/or use of iterative reconstruction technique. COMPARISON:  Abdomen and pelvis CT from February 08, 2022. FINDINGS: Cardiovascular: Noncontrast appearance of the heart great vessels is unremarkable aside from minimal calcified coronary artery disease of LEFT coronary circulation. No pericardial effusion or thickening grossly. Central pulmonary vasculature is of normal caliber. Mediastinum/Nodes: Mildly irregular RIGHT posterior mediastinal lymph node measuring 8-9 mm (image 119/2) LEFT retrocrural lymph node un enlarged but seen in the context of intra-aortocaval lymph node on image 148/2 that measures approximately 8 mm and in the setting of known or potential parental node involvement in the IMA region and scattered borderline LEFT periaortic lymph nodes. LEFT supraclavicular adenopathy (image 17/2) 11 mm Esophagus grossly normal. Lungs/Pleura: No suspicious pulmonary nodules. No consolidation. No pleural effusion. Airways are patent. Upper Abdomen: Multiple hypodense hepatic lesions suspicious for metastatic disease better characterized on dedicated abdominal CT. For instance, a lesion measuring as much as 2.5 cm on image 149/2 is noted with lesions scattered throughout the liver of similar size and smaller, greater than 20 lesions are suspected. Undo that Musculoskeletal: Spinal degenerative changes. No acute or destructive bone process. IMPRESSION: 1. Multiple hypodense hepatic lesions suspicious for metastatic disease better characterized on dedicated abdominal CT. 2. Signs of adenopathy in the chest. PET imaging could be considered as warranted for further evaluation. 3. Minimal calcified coronary artery disease of LEFT coronary circulation. Electronically Signed   By: Zetta Bills M.D.   On: 02/18/2022 19:33   MR ABDOMEN WWO CONTRAST  Result Date: 03/03/2022 CLINICAL DATA:   61 year old female with history of rectal cancer with multiple liver lesions noted on prior CT examination. Follow-up evaluation to assess for potential metastatic disease to the liver. * Tracking Code: BO * EXAM: MRI ABDOMEN WITHOUT AND WITH CONTRAST TECHNIQUE: Multiplanar multisequence MR imaging of the abdomen was performed both before and after the administration of intravenous contrast. CONTRAST:  74m MULTIHANCE GADOBENATE DIMEGLUMINE 529 MG/ML IV SOLN COMPARISON:  No prior abdominal MRI. CT the chest, CT the abdomen and pelvis 02/08/2022. FINDINGS: Lower chest: Bilateral breast implants are incidentally noted. Hepatobiliary: Multiple hepatic lesions are scattered throughout all aspects of the liver, which are T1 hypointense, mildly T2 hyperintense, demonstrate diffusion restriction and appear hypovascular on post gadolinium images, compatible with widespread metastatic disease to the liver. The largest of these is in segment 8 (axial image 30 of series 13) measuring 1.9 x 1.4 cm. No intra or extrahepatic biliary ductal dilatation. Gallbladder is unremarkable in appearance. Pancreas: No pancreatic mass. No pancreatic ductal dilatation. No pancreatic or peripancreatic fluid collections or inflammatory changes. Spleen:  Unremarkable. Adrenals/Urinary Tract: In the medial  aspect of the interpolar region of the right kidney there is a 1.5 cm lesion which is T1 hypointense, T2 hyperintense, and does not enhance, compatible with a simple cyst. Left kidney and bilateral adrenal glands are normal in appearance. No hydroureteronephrosis in the visualized portions of the abdomen. Bilateral adrenal glands are normal in appearance. Stomach/Bowel: Visualized portions are unremarkable. Vascular/Lymphatic: No aneurysm identified in the visualized abdominal vasculature. Multiple mildly enlarged and borderline enlarged para-aortic lymph nodes measuring up to 1.2 cm in short axis (axial image 65 of series 13) on the left.  Other: No significant volume of ascites noted in the visualized portions of the peritoneal cavity. Musculoskeletal: No definite aggressive appearing osseous lesions are noted in the visualized portions of the skeleton. IMPRESSION: 1. Numerous hypovascular hepatic lesions have imaging characteristics considered diagnostic of widespread metastatic disease to the liver. There is also retroperitoneal lymphadenopathy concerning for metastatic disease, as above. Electronically Signed   By: Vinnie Langton M.D.   On: 03/03/2022 08:16   IR US Guide Bx Asp/Drain  Result Date: 02/20/2022 INDICATION: History of rectal carcinoma. Liver lesion noted on recent CT. EXAM: ULTRASOUND-GUIDED CORE LIVER LESION BIOPSY MEDICATIONS: No periprocedural antibiotics were indicated. ANESTHESIA/SEDATION: Intravenous Fentanyl 146mg and Versed '2mg'$  were administered as conscious sedation during continuous monitoring of the patient's level of consciousness and physiological / cardiorespiratory status by the radiology RN, with a total moderate sedation time of 38 minutes. COMPLICATIONS: None immediate. PROCEDURE: Informed written consent was obtained from the patient after a thorough discussion of the procedural risks, benefits and alternatives. All questions were addressed. Maximal Sterile Barrier Technique was utilized including caps, mask, sterile gowns, sterile gloves, sterile drape, hand hygiene and skin antiseptic. A timeout was performed prior to the initiation of the procedure. Survey ultrasound of the right lobe of the liver was performed. A representative lesion was localized, and appropriate skin entry site was determined and marked. Skin was prepped with chlorhexidine, draped in usual sterile fashion, infiltrated locally with 1% lidocaine. Under real-time ultrasound guidance, 17 gauge trocar needle advanced to the margin of the lesion. Multiple coaxial 18 gauge core biopsy samples were obtained under real-time ultrasound guidance.  The guide needle was removed. Postprocedure scan shows no hemorrhage or other apparent complication. The patient tolerated the procedure well. IMPRESSION: 1. Technically successful ultrasound-guided core biopsy, liver lesion. Electronically Signed   By: DLucrezia EuropeM.D.   On: 02/20/2022 16:24   IR IMAGING GUIDED PORT INSERTION  Result Date: 02/20/2022 CLINICAL DATA:  Rectal carcinoma with liver lesions. Needs durable venous access for planned treatment regimen. EXAM: TUNNELED PORT CATHETER PLACEMENT WITH ULTRASOUND AND FLUOROSCOPIC GUIDANCE FLUOROSCOPY: Radiation Exposure Index (as provided by the fluoroscopic device): 1 mGy air Kerma ANESTHESIA/SEDATION: Intravenous Fentanyl 579m and Versed '1mg'$  were administered as conscious sedation during continuous monitoring of the patient's level of consciousness and physiological / cardiorespiratory status by the radiology RN, with a total moderate sedation time of 20 minutes. TECHNIQUE: The procedure, risks, benefits, and alternatives were explained to the patient. Questions regarding the procedure were encouraged and answered. The patient understands and consents to the procedure. Patency of the right IJ vein was confirmed with ultrasound with image documentation. An appropriate skin site was determined. Skin site was marked. Region was prepped using maximum barrier technique including cap and mask, sterile gown, sterile gloves, large sterile sheet, and Chlorhexidine as cutaneous antisepsis. The region was infiltrated locally with 1% lidocaine. Under real-time ultrasound guidance, the right IJ vein was accessed with a 21 gauge  micropuncture needle; the needle tip within the vein was confirmed with ultrasound image documentation. Needle was exchanged over a 018 guidewire for transitional dilator, and vascular measurement was performed. A small incision was made on the right anterior chest wall and a subcutaneous pocket fashioned. The power-injectable port was positioned  and its catheter tunneled to the right IJ dermatotomy site. The transitional dilator was exchanged over an Amplatz wire for a peel-away sheath, through which the port catheter, which had been trimmed to the appropriate length, was advanced and positioned under fluoroscopy with its tip at the cavoatrial junction. Spot chest radiograph confirms good catheter position and no pneumothorax. The port was flushed per protocol. The pocket was closed with deep interrupted and subcuticular continuous 3-0 Monocryl sutures. The incisions were covered with Dermabond then covered with a sterile dressing. The patient tolerated the procedure well. COMPLICATIONS: COMPLICATIONS None immediate IMPRESSION: Technically successful right IJ power-injectable port catheter placement. Ready for routine use. Electronically Signed   By: Lucrezia Europe M.D.   On: 02/20/2022 16:24     .Adin Hector, M.D., F.A.C.S. Gastrointestinal and Minimally Invasive Surgery [facility] [address], [place], [place] [address] [phone] Main / Paging  03/12/2022 10:10 AM    Adin Hector

## 2022-03-12 NOTE — Progress Notes (Signed)
Pacu RN Report to floor given  Gave report to  [employer]. [id number].    Discussed surgery, meds given in OR and Pacu, VS, IV fluids given, EBL, urine output, pain and other pertinent information. Also discussed if pt had any family or friends here or belongings with them.   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 NGT to intermittent LWS, L nare at 46cm. 3 lapsites w/ 2x2 and tegs, some staining. No bowel sounds. Minimal pain.   Pt exits my care.

## 2022-03-13 ENCOUNTER — Encounter (HOSPITAL_COMMUNITY): Payer: Self-pay | Admitting: Surgery

## 2022-03-13 DIAGNOSIS — T451X5A Adverse effect of antineoplastic and immunosuppressive drugs, initial encounter: Secondary | ICD-10-CM

## 2022-03-13 DIAGNOSIS — K56609 Unspecified intestinal obstruction, unspecified as to partial versus complete obstruction: Secondary | ICD-10-CM | POA: Diagnosis not present

## 2022-03-13 DIAGNOSIS — D701 Agranulocytosis secondary to cancer chemotherapy: Secondary | ICD-10-CM | POA: Diagnosis not present

## 2022-03-13 DIAGNOSIS — S32010A Wedge compression fracture of first lumbar vertebra, initial encounter for closed fracture: Secondary | ICD-10-CM | POA: Diagnosis not present

## 2022-03-13 DIAGNOSIS — E871 Hypo-osmolality and hyponatremia: Secondary | ICD-10-CM | POA: Diagnosis not present

## 2022-03-13 LAB — BASIC METABOLIC PANEL
Anion gap: 8 (ref 5–15)
BUN: 7 mg/dL — ABNORMAL LOW (ref 8–23)
CO2: 24 mmol/L (ref 22–32)
Calcium: 8.2 mg/dL — ABNORMAL LOW (ref 8.9–10.3)
Chloride: 103 mmol/L (ref 98–111)
Creatinine, Ser: 0.61 mg/dL (ref 0.44–1.00)
GFR, Estimated: >60 mL/min (ref >60)
Glucose, Bld: 103 mg/dL — ABNORMAL HIGH (ref 70–99)
Potassium: 3.6 mmol/L (ref 3.5–5.1)
Sodium: 135 mmol/L (ref 135–145)

## 2022-03-13 LAB — CBC WITH DIFFERENTIAL/PLATELET
Abs Immature Granulocytes: 0.07 10*3/uL (ref 0.00–0.07)
Basophils Absolute: 0 10*3/uL (ref 0.0–0.1)
Basophils Relative: 1 %
Eosinophils Absolute: 0 10*3/uL (ref 0.0–0.5)
Eosinophils Relative: 0 %
HCT: 28.2 % — ABNORMAL LOW (ref 36.0–46.0)
Hemoglobin: 10 g/dL — ABNORMAL LOW (ref 12.0–15.0)
Immature Granulocytes: 5 %
Lymphocytes Relative: 27 %
Lymphs Abs: 0.4 10*3/uL — ABNORMAL LOW (ref 0.7–4.0)
MCH: 31.2 pg (ref 26.0–34.0)
MCHC: 35.5 g/dL (ref 30.0–36.0)
MCV: 87.9 fL (ref 80.0–100.0)
Monocytes Absolute: 0.3 10*3/uL (ref 0.1–1.0)
Monocytes Relative: 23 %
Neutro Abs: 0.6 10*3/uL — ABNORMAL LOW (ref 1.7–7.7)
Neutrophils Relative %: 44 %
Platelets: 180 10*3/uL (ref 150–400)
RBC: 3.21 MIL/uL — ABNORMAL LOW (ref 3.87–5.11)
RDW: 11.6 % (ref 11.5–15.5)
WBC: 1.4 10*3/uL — CL (ref 4.0–10.5)
nRBC: 0 % (ref 0.0–0.2)

## 2022-03-13 LAB — MAGNESIUM: Magnesium: 2 mg/dL (ref 1.7–2.4)

## 2022-03-13 LAB — PHOSPHORUS: Phosphorus: 2.1 mg/dL — ABNORMAL LOW (ref 2.5–4.6)

## 2022-03-13 MED ORDER — POTASSIUM PHOSPHATES 15 MMOLE/5ML IV SOLN
30.0000 mmol | Freq: Once | INTRAVENOUS | Status: AC
  Administered 2022-03-13: 30 mmol via INTRAVENOUS
  Filled 2022-03-13: qty 10

## 2022-03-13 MED ORDER — METHOCARBAMOL 1000 MG/10ML IJ SOLN
1000.0000 mg | Freq: Four times a day (QID) | INTRAVENOUS | Status: DC
  Administered 2022-03-13 – 2022-03-20 (×27): 1000 mg via INTRAVENOUS
  Filled 2022-03-13 (×2): qty 1000
  Filled 2022-03-13: qty 10
  Filled 2022-03-13 (×3): qty 1000
  Filled 2022-03-13: qty 10
  Filled 2022-03-13 (×4): qty 1000
  Filled 2022-03-13: qty 10
  Filled 2022-03-13: qty 1000
  Filled 2022-03-13 (×2): qty 10
  Filled 2022-03-13 (×5): qty 1000
  Filled 2022-03-13 (×2): qty 10
  Filled 2022-03-13 (×8): qty 1000

## 2022-03-13 MED ORDER — ACETAMINOPHEN 10 MG/ML IV SOLN
1000.0000 mg | Freq: Four times a day (QID) | INTRAVENOUS | Status: AC
Start: 2022-03-13 — End: 2022-03-14
  Administered 2022-03-13 – 2022-03-14 (×4): 1000 mg via INTRAVENOUS
  Filled 2022-03-13 (×6): qty 100

## 2022-03-13 MED ORDER — MORPHINE SULFATE (PF) 2 MG/ML IV SOLN
2.0000 mg | Freq: Once | INTRAVENOUS | Status: AC
  Administered 2022-03-13: 2 mg via INTRAVENOUS
  Filled 2022-03-13: qty 1

## 2022-03-13 MED ORDER — SODIUM CHLORIDE 0.9 % IV SOLN
1000.0000 mL | INTRAVENOUS | Status: DC
  Administered 2022-03-13: 1000 mL via INTRAVENOUS

## 2022-03-13 MED ORDER — DEXTROSE 5 % IV SOLN
1000.0000 mg | Freq: Four times a day (QID) | INTRAVENOUS | Status: DC
  Filled 2022-03-13: qty 10

## 2022-03-13 MED ORDER — MORPHINE SULFATE (PF) 2 MG/ML IV SOLN
2.0000 mg | INTRAVENOUS | Status: DC | PRN
  Administered 2022-03-13: 2 mg via INTRAVENOUS
  Administered 2022-03-13: 4 mg via INTRAVENOUS
  Administered 2022-03-14 – 2022-03-17 (×15): 2 mg via INTRAVENOUS
  Filled 2022-03-13 (×4): qty 1
  Filled 2022-03-13: qty 2
  Filled 2022-03-13 (×11): qty 1
  Filled 2022-03-13: qty 2
  Filled 2022-03-13: qty 1

## 2022-03-13 MED ORDER — ENOXAPARIN SODIUM 40 MG/0.4ML IJ SOSY
40.0000 mg | PREFILLED_SYRINGE | INTRAMUSCULAR | Status: DC
  Administered 2022-03-13 – 2022-03-19 (×7): 40 mg via SUBCUTANEOUS
  Filled 2022-03-13 (×7): qty 0.4

## 2022-03-13 MED ORDER — SODIUM CHLORIDE 0.9 % IV SOLN
1000.0000 mL | INTRAVENOUS | Status: DC
Start: 2022-03-13 — End: 2022-03-14
  Administered 2022-03-13 (×2): 1000 mL via INTRAVENOUS

## 2022-03-13 MED ORDER — MORPHINE SULFATE (PF) 2 MG/ML IV SOLN: 2.0000 mg | INTRAVENOUS | Status: DC | PRN

## 2022-03-13 NOTE — Progress Notes (Signed)
Patient condition is stable at this time. Just received order from Dr. Georgette Dover to discontinue telemetry orders. We will now put in another bed request for [facility] surgical floor.Roderick Pee

## 2022-03-13 NOTE — Progress Notes (Addendum)
HEMATOLOGY-ONCOLOGY PROGRESS NOTE  ASSESSMENT AND PLAN: Colorectal cancer 02/08/2022 CT abdomen/pelvis without contrast-severe circumferential wall thickening of the distal sigmoid colon and rectum; enlarged perirectal and low left retroperitoneal lymph nodes; multiple ill-defined hypodense liver lesions; trace ascites in the low pelvis.  02/08/2022 Colonoscopy by Dr. Charlyne Mom almost completely obstructing medium-sized mass in the rectosigmoid colon measuring from 12 to 17 cm.  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  Foundation 1-microsatellite stable, tumor mutation burden 5, [id number], K-ras wild-type 02/09/2022 CEA 3.2 CT chest 02/18/2022-chest adenopathy.  Multiple hypodense liver lesions. 02/20/2022 biopsy of liver lesion-fragments of benign liver parenchyma with reactive changes.  No evidence of malignancy. MRI abdomen 03/01/2022-numerous hypovascular hepatic lesions having imaging characteristics consider diagnostic of widespread metastatic disease to the liver.  Retroperitoneal lymphadenopathy concerning for metastatic disease. Cycle 1 FOLFOX 03/02/2022 Diverting colostomy 03/12/2022 Rectal bleeding, lower abdominal pain, constipation secondary to #1 Multiple family members with cancer Hypercholesterolemia Port-A-Cath placement 02/20/2022 Hospital admission 03/11/2022-colonic obstruction secondary to colon cancer. Neutropenia secondary to chemotherapy  Karen Kaufman has metastatic colorectal cancer and is now at day 12 following her first cycle of chemo.  She was completely obstructed from her colon mass and underwent diverting colostomy 03/12/2022.  She has abdominal pain this morning secondary to surgery.  Surgery is following and adjusting pain medication.  When she recovers from surgery, we will plan to resume systemic chemotherapy as an outpatient.  We will consider adding Irinotecan to her FOLFOX regimen.  She has developed severe neutropenia from her  chemotherapy.  Recommend close monitoring of CBC with differential.  Obtain blood and urine cultures and begin broad-spectrum of antibiotics if she develops a fever  Recommendations: 1.  Postop care per general surgery 2.  We will plan to resume outpatient chemotherapy when she has recovered from surgery.  We will consider addition of Irinotecan to FOLFOX regimen. 3.  Daily CBC/differential 4.  Blood/urine cultures and broad-spectrum intravenous antibiotics for a fever spike 5.  Please call Oncology as needed  Mikey Bussing, NP  Karen Kaufman was interviewed and examined.  She underwent a diverting colostomy yesterday and is recovering from surgery.  She has abdominal discomfort and distention.  She has neutropenia at day 12 following chemotherapy.  The plan is to resume systemic chemotherapy as an outpatient.  Please call oncology over the weekend as needed.  I was present for greater than 50% of today's visit.  I performed medical decision making.  SUBJECTIVE: Karen Kaufman diverting colostomy yesterday.  She reports abdominal discomfort.  NG tube in place.  No other complaints this morning.  Oncology History  Rectal cancer ([place])  02/09/2022 Initial Diagnosis   Rectal cancer ([place])   03/02/2022 -  Chemotherapy   Patient is on Treatment Plan : COLORECTAL FOLFOX q14d      PHYSICAL EXAMINATION:  Vitals:   03/12/22 2153 03/13/22 0618  BP: 128/75 120/68  Pulse: 100 (!) 105  Resp: 16 18  Temp: 99.3 F (37.4 C) 99 F (37.2 C)  SpO2: 92% 91%   There were no vitals filed for this visit.  Intake/Output from previous day: 05/25 0701 - 05/26 0700 In: 3489.6 [I.V.:2989.6; IV Piggyback:500] Out: 2180 [Urine:1130; Emesis/NG output:500; Stool:500; Blood:50]  Physical Exam Vitals reviewed.  Constitutional:      General: She is not in acute distress. HENT:     Head: Normocephalic.     Mouth/Throat:     Pharynx: No oropharyngeal exudate or posterior oropharyngeal erythema.  Eyes:      General:  No scleral icterus.    Conjunctiva/sclera: Conjunctivae normal.  Cardiovascular:     Rate and Rhythm: Regular rhythm.  Pulmonary:     Effort: Pulmonary effort is normal. No respiratory distress.  Abdominal:     Comments: Colostomy in the left lower quadrant, small amount of liquid stool  Skin:    General: Skin is warm and dry.  Neurological:     Mental Status: She is alert and oriented to person, place, and time.    LABORATORY DATA:  I have reviewed the data as listed    Latest Ref Rng & Units 03/13/2022    5:49 AM 03/12/2022    9:21 AM 03/11/2022   10:57 AM  CMP  Glucose 70 - 99 mg/dL 103   110   102    BUN 8 - 23 mg/dL '7   12   9    ' Creatinine 0.44 - 1.00 mg/dL 0.61   0.59   0.63    Sodium 135 - 145 mmol/L 135   133   133    Potassium 3.5 - 5.1 mmol/L 3.6   3.5   3.2    Chloride 98 - 111 mmol/L 103   101   94    CO2 22 - 32 mmol/L '24   23   27    ' Calcium 8.9 - 10.3 mg/dL 8.2   8.3   8.9    Total Protein 6.5 - 8.1 g/dL  6.0   6.9    Total Bilirubin 0.3 - 1.2 mg/dL  1.1   0.9    Alkaline Phos 38 - 126 U/L  125   121    AST 15 - 41 U/L  30   29    ALT 0 - 44 U/L  30   28      Lab Results  Component Value Date   WBC 1.4 (LL) 03/13/2022   HGB 10.0 (L) 03/13/2022   HCT 28.2 (L) 03/13/2022   MCV 87.9 03/13/2022   PLT 180 03/13/2022   NEUTROABS 0.6 (L) 03/13/2022    Lab Results  Component Value Date   CEA1 3.2 02/09/2022    CT ABDOMEN PELVIS WO CONTRAST  Result Date: 03/11/2022 CLINICAL DATA:  Abdominal pain and bloating for several days. No bowel movement for 4 days. History of metastatic rectal cancer. * Tracking Code: BO * EXAM: CT ABDOMEN AND PELVIS WITHOUT CONTRAST TECHNIQUE: Multidetector CT imaging of the abdomen and pelvis was performed following the standard protocol without IV contrast. RADIATION DOSE REDUCTION: This exam was performed according to the departmental dose-optimization program which includes automated exposure control, adjustment of the mA  and/or kV according to patient size and/or use of iterative reconstruction technique. COMPARISON:  03/01/2022 MRI abdomen.  02/08/2022 CT abdomen/pelvis. FINDINGS: Lower chest: No acute abnormality at the lung bases. Mildly prominent 0.8 cm right pericardiophrenic lymph node (series 2/image 10), increased from 0.6 cm on 02/08/2022 CT. Partially visualized bilateral breast prostheses. Hepatobiliary: Several (greater than 5) ill-defined hypodense liver masses scattered throughout the liver, appearing mildly increased since 02/08/2022 CT. Representative 2.4 cm posterior right liver mass (series 2/image 17), increased from 1.8 cm. Central right liver 1.5 cm mass (series 2/image 12), mildly increased from 1.2 cm. Anterior left liver 1.3 cm mass (series 2/image 13), increased from 1.1 cm. Normal gallbladder with no radiopaque cholelithiasis. No biliary ductal dilatation. Pancreas: Normal, with no mass or duct dilation. Spleen: Normal size. No mass. Adrenals/Urinary Tract: Normal adrenals. No renal stones. No hydronephrosis. Simple 1.4  cm medial interpolar right renal cyst, for which no follow-up is recommended. Otherwise no contour deforming renal masses. Normal bladder. Stomach/Bowel: Normal non-distended stomach. No dilated small bowel loops. Scattered air-fluid levels in the distal small bowel. No small bowel wall thickening. Mildly dilated fluid-filled appendix without appendiceal wall thickening or significant periappendiceal fat stranding. Annular wall thickening at the rectosigmoid junction involving an approximately 8 cm in length segment, proximal to which the colon is prominently distended with widespread colonic air-fluid levels. Cecal diameter 8.8 cm. Vascular/Lymphatic: Normal caliber abdominal aorta. Left para-lymphadenopathy up to 1.0 cm (series 2/image 36), increased from 0.7 cm. Multiple enlarged left perirectal nodes, largest 1.6 cm short axis diameter (series 2/image 65), stable. Reproductive: Status  post hysterectomy, with no abnormal findings at the vaginal cuff. No adnexal mass. Other: No pneumoperitoneum. Small volume pelvic ascites. Presacral space ill-defined fluid and perirectal fat stranding is mildly increased. No focal fluid collection. Musculoskeletal: Moderate L1 vertebral compression fracture is new since 02/08/2022 CT, without definite discrete osseous lesions. Mild thoracolumbar spondylosis. IMPRESSION: 1. Distal large bowel obstruction at the site of a large annular mass at the rectosigmoid junction. 2. Progressive liver metastases. 3. Progressive perirectal, left para-aortic and right pericardiophrenic nodal metastases. 4. Small volume pelvic ascites. 5. Moderate L1 vertebral compression fracture, new since 02/08/2022 CT, without definite underlying osseous lesion. Electronically Signed   By: Ilona Sorrel M.D.   On: 03/11/2022 13:38   DG Chest 1 View  Addendum Date: 03/12/2022   ADDENDUM REPORT: 03/12/2022 10:53 ADDENDUM: Requested addendum. The Port-A-Cath reservoir appears in expected position, does not appear flipped. Electronically Signed   By: Lavonia Dana M.D.   On: 03/12/2022 10:53   Result Date: 03/12/2022 CLINICAL DATA:  Port-A-Cath placement EXAM: CHEST  1 VIEW COMPARISON:  Portable exam 0851 hours without priors for comparison FINDINGS: RIGHT jugular Port-A-Cath with tip projecting over SVC. Normal heart size, mediastinal contours, and pulmonary vascularity. Lungs clear. No infiltrate, pleural effusion, or pneumothorax. Bones appear demineralized. IMPRESSION: No pneumothorax following RIGHT jugular Port-A-Cath insertion. Electronically Signed: By: Lavonia Dana M.D. On: 03/12/2022 09:54   CT Chest Wo Contrast  Result Date: 02/18/2022 CLINICAL DATA:  A 61 year old female presents for evaluation of rectal cancer, staging assessment. * Tracking Code: BO * EXAM: CT CHEST WITHOUT CONTRAST TECHNIQUE: Multidetector CT imaging of the chest was performed following the standard protocol  without IV contrast. RADIATION DOSE REDUCTION: This exam was performed according to the departmental dose-optimization program which includes automated exposure control, adjustment of the mA and/or kV according to patient size and/or use of iterative reconstruction technique. COMPARISON:  Abdomen and pelvis CT from February 08, 2022. FINDINGS: Cardiovascular: Noncontrast appearance of the heart great vessels is unremarkable aside from minimal calcified coronary artery disease of LEFT coronary circulation. No pericardial effusion or thickening grossly. Central pulmonary vasculature is of normal caliber. Mediastinum/Nodes: Mildly irregular RIGHT posterior mediastinal lymph node measuring 8-9 mm (image 119/2) LEFT retrocrural lymph node un enlarged but seen in the context of intra-aortocaval lymph node on image 148/2 that measures approximately 8 mm and in the setting of known or potential parental node involvement in the IMA region and scattered borderline LEFT periaortic lymph nodes. LEFT supraclavicular adenopathy (image 17/2) 11 mm Esophagus grossly normal. Lungs/Pleura: No suspicious pulmonary nodules. No consolidation. No pleural effusion. Airways are patent. Upper Abdomen: Multiple hypodense hepatic lesions suspicious for metastatic disease better characterized on dedicated abdominal CT. For instance, a lesion measuring as much as 2.5 cm on image 149/2 is noted  with lesions scattered throughout the liver of similar size and smaller, greater than 20 lesions are suspected. Undo that Musculoskeletal: Spinal degenerative changes. No acute or destructive bone process. IMPRESSION: 1. Multiple hypodense hepatic lesions suspicious for metastatic disease better characterized on dedicated abdominal CT. 2. Signs of adenopathy in the chest. PET imaging could be considered as warranted for further evaluation. 3. Minimal calcified coronary artery disease of LEFT coronary circulation. Electronically Signed   By: Zetta Bills  M.D.   On: 02/18/2022 19:33   MR ABDOMEN WWO CONTRAST  Result Date: 03/03/2022 CLINICAL DATA:  61 year old female with history of rectal cancer with multiple liver lesions noted on prior CT examination. Follow-up evaluation to assess for potential metastatic disease to the liver. * Tracking Code: BO * EXAM: MRI ABDOMEN WITHOUT AND WITH CONTRAST TECHNIQUE: Multiplanar multisequence MR imaging of the abdomen was performed both before and after the administration of intravenous contrast. CONTRAST:  83m MULTIHANCE GADOBENATE DIMEGLUMINE 529 MG/ML IV SOLN COMPARISON:  No prior abdominal MRI. CT the chest, CT the abdomen and pelvis 02/08/2022. FINDINGS: Lower chest: Bilateral breast implants are incidentally noted. Hepatobiliary: Multiple hepatic lesions are scattered throughout all aspects of the liver, which are T1 hypointense, mildly T2 hyperintense, demonstrate diffusion restriction and appear hypovascular on post gadolinium images, compatible with widespread metastatic disease to the liver. The largest of these is in segment 8 (axial image 30 of series 13) measuring 1.9 x 1.4 cm. No intra or extrahepatic biliary ductal dilatation. Gallbladder is unremarkable in appearance. Pancreas: No pancreatic mass. No pancreatic ductal dilatation. No pancreatic or peripancreatic fluid collections or inflammatory changes. Spleen:  Unremarkable. Adrenals/Urinary Tract: In the medial aspect of the interpolar region of the right kidney there is a 1.5 cm lesion which is T1 hypointense, T2 hyperintense, and does not enhance, compatible with a simple cyst. Left kidney and bilateral adrenal glands are normal in appearance. No hydroureteronephrosis in the visualized portions of the abdomen. Bilateral adrenal glands are normal in appearance. Stomach/Bowel: Visualized portions are unremarkable. Vascular/Lymphatic: No aneurysm identified in the visualized abdominal vasculature. Multiple mildly enlarged and borderline enlarged  para-aortic lymph nodes measuring up to 1.2 cm in short axis (axial image 65 of series 13) on the left. Other: No significant volume of ascites noted in the visualized portions of the peritoneal cavity. Musculoskeletal: No definite aggressive appearing osseous lesions are noted in the visualized portions of the skeleton. IMPRESSION: 1. Numerous hypovascular hepatic lesions have imaging characteristics considered diagnostic of widespread metastatic disease to the liver. There is also retroperitoneal lymphadenopathy concerning for metastatic disease, as above. Electronically Signed   By: DVinnie LangtonM.D.   On: 03/03/2022 08:16   IR [place] Bx Asp/Drain  Result Date: 02/20/2022 INDICATION: History of rectal carcinoma. Liver lesion noted on recent CT. EXAM: ULTRASOUND-GUIDED CORE LIVER LESION BIOPSY MEDICATIONS: No periprocedural antibiotics were indicated. ANESTHESIA/SEDATION: Intravenous Fentanyl 1521m and Versed 6m37mere administered as conscious sedation during continuous monitoring of the patient's level of consciousness and physiological / cardiorespiratory status by the radiology RN, with a total moderate sedation time of 38 minutes. COMPLICATIONS: None immediate. PROCEDURE: Informed written consent was obtained from the patient after a thorough discussion of the procedural risks, benefits and alternatives. All questions were addressed. Maximal Sterile Barrier Technique was utilized including caps, mask, sterile gowns, sterile gloves, sterile drape, hand hygiene and skin antiseptic. A timeout was performed prior to the initiation of the procedure. Survey ultrasound of the right lobe of the liver was performed. A representative  lesion was localized, and appropriate skin entry site was determined and marked. Skin was prepped with chlorhexidine, draped in usual sterile fashion, infiltrated locally with 1% lidocaine. Under real-time ultrasound guidance, 17 gauge trocar needle advanced to the margin of the  lesion. Multiple coaxial 18 gauge core biopsy samples were obtained under real-time ultrasound guidance. The guide needle was removed. Postprocedure scan shows no hemorrhage or other apparent complication. The patient tolerated the procedure well. IMPRESSION: 1. Technically successful ultrasound-guided core biopsy, liver lesion. Electronically Signed   By: Lucrezia Europe M.D.   On: 02/20/2022 16:24   DG Abd Portable 1V  Result Date: 03/12/2022 CLINICAL DATA:  NG tube placement EXAM: PORTABLE ABDOMEN - 1 VIEW COMPARISON:  01/07/2022 FINDINGS: AP image of upper abdomen and chest shows tip nasogastric tube to be in the region of distal antrum of the stomach. There is another ostomy catheter in the left mid abdomen. Tip of right IJ chest port is seen in the superior vena cava close to the right atrium. Small linear densities in the lower lung fields suggest subsegmental atelectasis. There is mild dilation of small-bowel loops in the upper abdomen. Lower abdomen and pelvis are not included in the image. IMPRESSION: Tip enteric tube is seen in the distal antrum of the stomach. Electronically Signed   By: Elmer Picker M.D.   On: 03/12/2022 13:45   IR IMAGING GUIDED PORT INSERTION  Result Date: 02/20/2022 CLINICAL DATA:  Rectal carcinoma with liver lesions. Needs durable venous access for planned treatment regimen. EXAM: TUNNELED PORT CATHETER PLACEMENT WITH ULTRASOUND AND FLUOROSCOPIC GUIDANCE FLUOROSCOPY: Radiation Exposure Index (as provided by the fluoroscopic device): 1 mGy air Kerma ANESTHESIA/SEDATION: Intravenous Fentanyl 73mg and Versed 196mwere administered as conscious sedation during continuous monitoring of the patient's level of consciousness and physiological / cardiorespiratory status by the radiology RN, with a total moderate sedation time of 20 minutes. TECHNIQUE: The procedure, risks, benefits, and alternatives were explained to the patient. Questions regarding the procedure were encouraged and  answered. The patient understands and consents to the procedure. Patency of the right IJ vein was confirmed with ultrasound with image documentation. An appropriate skin site was determined. Skin site was marked. Region was prepped using maximum barrier technique including cap and mask, sterile gown, sterile gloves, large sterile sheet, and Chlorhexidine as cutaneous antisepsis. The region was infiltrated locally with 1% lidocaine. Under real-time ultrasound guidance, the right IJ vein was accessed with a 21 gauge micropuncture needle; the needle tip within the vein was confirmed with ultrasound image documentation. Needle was exchanged over a 018 guidewire for transitional dilator, and vascular measurement was performed. A small incision was made on the right anterior chest wall and a subcutaneous pocket fashioned. The power-injectable port was positioned and its catheter tunneled to the right IJ dermatotomy site. The transitional dilator was exchanged over an Amplatz wire for a peel-away sheath, through which the port catheter, which had been trimmed to the appropriate length, was advanced and positioned under fluoroscopy with its tip at the cavoatrial junction. Spot chest radiograph confirms good catheter position and no pneumothorax. The port was flushed per protocol. The pocket was closed with deep interrupted and subcuticular continuous 3-0 Monocryl sutures. The incisions were covered with Dermabond then covered with a sterile dressing. The patient tolerated the procedure well. COMPLICATIONS: COMPLICATIONS None immediate IMPRESSION: Technically successful right IJ power-injectable port catheter placement. Ready for routine use. Electronically Signed   By: D Lucrezia Europe.D.   On: 02/20/2022 16:24  Future Appointments  Date Time Provider [place]  03/17/2022  7:45 AM [facility] PHLEBOTOMIST [facility] None  03/17/2022  8:00 AM [facility] FLUSH ROOM [facility] None  03/17/2022  8:20 AM Ladell Pier,  MD [facility] None  03/17/2022  9:15 AM [facility] CHAIR 1 [facility] None  03/19/2022  1:30 PM [facility] FLUSH ROOM [facility] None  03/31/2022  8:30 AM [facility] PHLEBOTOMIST [facility] None  03/31/2022  8:45 AM [facility] FLUSH ROOM [facility] None  03/31/2022  9:15 AM Owens Shark, NP [facility] None  03/31/2022 10:15 AM [facility] CHAIR 2 [facility] None  04/02/2022  1:15 PM [facility] FLUSH ROOM [facility] None  05/15/2022  3:20 PM Jaquita Folds, MD [facility]      LOS: 2 days

## 2022-03-13 NOTE — Progress Notes (Signed)
Patient was transferred to 3rd floor via wheelchair with no complaints.  No changes from previous RN assessment.  3rd floor RN assessed tube site and colostomy site with this RN. NG was flushed with air (per orders) prior to bringing patient to floor. Karen Kaufman

## 2022-03-13 NOTE — Progress Notes (Addendum)
Patient ID: Karen Kaufman, female   DOB: 05/13/1961, 61 y.o.   MRN: [id number] [facility] Surgery Progress Note  1 Day Post-Op  Subjective: CC-  Having a lot of abdominal discomfort. Morphine does help some. Bloating is less than prior to surgery. NG with 500cc bilious output. Colostomy productive. Foley just removed a little earlier this morning, has not yet voided. Has not been OOB since surgery.  Objective: Vital signs in last 24 hours: Temp:  [97.9 F (36.6 C)-99.3 F (37.4 C)] 99 F (37.2 C) (05/26 0618) Pulse Rate:  [95-106] 105 (05/26 0618) Resp:  [16-23] 18 (05/26 0618) BP: (120-149)/(68-84) 120/68 (05/26 0618) SpO2:  [91 %-100 %] 91 % (05/26 0618) Last BM Date : 03/13/22  Intake/Output from previous day: 05/25 0701 - 05/26 0700 In: 3489.6 [I.V.:2989.6; IV Piggyback:500] Out: 2180 [Urine:1130; Emesis/NG output:500; Stool:500; Blood:50] Intake/Output this shift: Total I/O In: -  Out: 900 [Urine:900]  PE: Gen:  Alert, NAD but uncomfortable, pleasant Pulm: rate and effort normal on room air Abd: soft, mild distension, appropriately tender, lap incisions with cdi dressings, ostomy viable with liquid brown stool in pouch and small amount of bright red blood, malecot in ostomy  Lab Results:  Recent Labs    03/12/22 0921 03/13/22 0549  WBC 1.0* 1.4*  HGB 12.9 10.0*  HCT 37.1 28.2*  PLT 220 180   BMET Recent Labs    03/12/22 0921 03/13/22 0549  NA 133* 135  K 3.5 3.6  CL 101 103  CO2 23 24  GLUCOSE 110* 103*  BUN 12 7*  CREATININE 0.59 0.61  CALCIUM 8.3* 8.2*   PT/INR No results for input(s): LABPROT, INR in the last 72 hours. CMP     Component Value Date/Time   NA 135 03/13/2022 0549   NA 143 01/26/2022 0830   K 3.6 03/13/2022 0549   CL 103 03/13/2022 0549   CO2 24 03/13/2022 0549   GLUCOSE 103 (H) 03/13/2022 0549   BUN 7 (L) 03/13/2022 0549   BUN 5 (L) 01/26/2022 0830   CREATININE 0.61 03/13/2022 0549   CALCIUM 8.2 (L) 03/13/2022 0549    PROT 6.0 (L) 03/12/2022 0921   PROT 6.5 01/26/2022 0830   ALBUMIN 3.1 (L) 03/12/2022 0921   ALBUMIN 4.5 01/26/2022 0830   AST 30 03/12/2022 0921   ALT 30 03/12/2022 0921   ALKPHOS 125 03/12/2022 0921   BILITOT 1.1 03/12/2022 0921   BILITOT 0.4 01/26/2022 0830   GFRNONAA >60 03/13/2022 0549   GFRAA 92 11/28/2020 0817   Lipase     Component Value Date/Time   LIPASE 24 03/11/2022 1057       Studies/Results: CT ABDOMEN PELVIS WO CONTRAST  Result Date: 03/11/2022 CLINICAL DATA:  Abdominal pain and bloating for several days. No bowel movement for 4 days. History of metastatic rectal cancer. * Tracking Code: BO * EXAM: CT ABDOMEN AND PELVIS WITHOUT CONTRAST TECHNIQUE: Multidetector CT imaging of the abdomen and pelvis was performed following the standard protocol without IV contrast. RADIATION DOSE REDUCTION: This exam was performed according to the departmental dose-optimization program which includes automated exposure control, adjustment of the mA and/or kV according to patient size and/or use of iterative reconstruction technique. COMPARISON:  03/01/2022 MRI abdomen.  02/08/2022 CT abdomen/pelvis. FINDINGS: Lower chest: No acute abnormality at the lung bases. Mildly prominent 0.8 cm right pericardiophrenic lymph node (series 2/image 10), increased from 0.6 cm on 02/08/2022 CT. Partially visualized bilateral breast prostheses. Hepatobiliary: Several (greater than 5) ill-defined hypodense liver masses  scattered throughout the liver, appearing mildly increased since 02/08/2022 CT. Representative 2.4 cm posterior right liver mass (series 2/image 17), increased from 1.8 cm. Central right liver 1.5 cm mass (series 2/image 12), mildly increased from 1.2 cm. Anterior left liver 1.3 cm mass (series 2/image 13), increased from 1.1 cm. Normal gallbladder with no radiopaque cholelithiasis. No biliary ductal dilatation. Pancreas: Normal, with no mass or duct dilation. Spleen: Normal size. No mass.  Adrenals/Urinary Tract: Normal adrenals. No renal stones. No hydronephrosis. Simple 1.4 cm medial interpolar right renal cyst, for which no follow-up is recommended. Otherwise no contour deforming renal masses. Normal bladder. Stomach/Bowel: Normal non-distended stomach. No dilated small bowel loops. Scattered air-fluid levels in the distal small bowel. No small bowel wall thickening. Mildly dilated fluid-filled appendix without appendiceal wall thickening or significant periappendiceal fat stranding. Annular wall thickening at the rectosigmoid junction involving an approximately 8 cm in length segment, proximal to which the colon is prominently distended with widespread colonic air-fluid levels. Cecal diameter 8.8 cm. Vascular/Lymphatic: Normal caliber abdominal aorta. Left para-lymphadenopathy up to 1.0 cm (series 2/image 36), increased from 0.7 cm. Multiple enlarged left perirectal nodes, largest 1.6 cm short axis diameter (series 2/image 65), stable. Reproductive: Status post hysterectomy, with no abnormal findings at the vaginal cuff. No adnexal mass. Other: No pneumoperitoneum. Small volume pelvic ascites. Presacral space ill-defined fluid and perirectal fat stranding is mildly increased. No focal fluid collection. Musculoskeletal: Moderate L1 vertebral compression fracture is new since 02/08/2022 CT, without definite discrete osseous lesions. Mild thoracolumbar spondylosis. IMPRESSION: 1. Distal large bowel obstruction at the site of a large annular mass at the rectosigmoid junction. 2. Progressive liver metastases. 3. Progressive perirectal, left para-aortic and right pericardiophrenic nodal metastases. 4. Small volume pelvic ascites. 5. Moderate L1 vertebral compression fracture, new since 02/08/2022 CT, without definite underlying osseous lesion. Electronically Signed   By: Ilona Sorrel M.D.   On: 03/11/2022 13:38   DG Chest 1 View  Addendum Date: 03/12/2022   ADDENDUM REPORT: 03/12/2022 10:53  ADDENDUM: Requested addendum. The Port-A-Cath reservoir appears in expected position, does not appear flipped. Electronically Signed   By: Lavonia Dana M.D.   On: 03/12/2022 10:53   Result Date: 03/12/2022 CLINICAL DATA:  Port-A-Cath placement EXAM: CHEST  1 VIEW COMPARISON:  Portable exam 0851 hours without priors for comparison FINDINGS: RIGHT jugular Port-A-Cath with tip projecting over SVC. Normal heart size, mediastinal contours, and pulmonary vascularity. Lungs clear. No infiltrate, pleural effusion, or pneumothorax. Bones appear demineralized. IMPRESSION: No pneumothorax following RIGHT jugular Port-A-Cath insertion. Electronically Signed: By: Lavonia Dana M.D. On: 03/12/2022 09:54   DG Abd Portable 1V  Result Date: 03/12/2022 CLINICAL DATA:  NG tube placement EXAM: PORTABLE ABDOMEN - 1 VIEW COMPARISON:  01/07/2022 FINDINGS: AP image of upper abdomen and chest shows tip nasogastric tube to be in the region of distal antrum of the stomach. There is another ostomy catheter in the left mid abdomen. Tip of right IJ chest port is seen in the superior vena cava close to the right atrium. Small linear densities in the lower lung fields suggest subsegmental atelectasis. There is mild dilation of small-bowel loops in the upper abdomen. Lower abdomen and pelvis are not included in the image. IMPRESSION: Tip enteric tube is seen in the distal antrum of the stomach. Electronically Signed   By: Elmer Picker M.D.   On: 03/12/2022 13:45    Anti-infectives: Anti-infectives (From admission, onward)    Start     Dose/Rate Route Frequency Ordered Stop  03/12/22 0600  cefoTEtan (CEFOTAN) 2 g in sodium chloride 0.9 % 100 mL IVPB        2 g 200 mL/hr over 30 Minutes Intravenous On call to O.R. 03/11/22 1508 03/12/22 1032   03/11/22 2000  neomycin (MYCIFRADIN) tablet 1,000 mg       See Hyperspace for full Linked Orders Report.   1,000 mg Oral 3 times per day 03/11/22 1508 03/12/22 1459   03/11/22 2000   metroNIDAZOLE (FLAGYL) tablet 1,000 mg       See Hyperspace for full Linked Orders Report.   1,000 mg Oral 3 times per day 03/11/22 1508 03/12/22 1459        Assessment/Plan Large bowel obstruction secondary to Metastatic colorectal cancer with liver mets and LN mets, on chemo -POD#1 s/p LAPAROSCOPIC ASSISTED DIVERTING COLOSTOMY 5/25 Dr. Johney Maine - continue NPO/NGT to [facility] and await return in bowel function - WOC consult for new ostomy. Patient had discomfort trying to remove malecot this morning. Will see if she can premedicate and have [place] nurse remove this later today vs I will come back later after she gets some pain medication on board - schedule IV tylenol and IV robaxin for better pain control - mobilize, will ask PT to see   FEN: IVF, NPO/NGT to [facility]WS VTE: SCDs, ok for chemical DVT prophylaxis from surgical standpoint ID: none currently Foley: removed today 5/26 monitor for ability to void  Anemia Neutropenia L1 vertebral compression fx    LOS: 2 days    Wellington Hampshire, [facility] Surgery 03/13/2022, 9:00 AM Please see Amion for pager number during day hours 7:00am-4:30pm

## 2022-03-13 NOTE — Progress Notes (Signed)
[facility] PROGRESS NOTE   Karen Kaufman CWC:[id number] DOB: 1961-10-10 DOA: 03/11/2022  PCP: Gwenlyn Perking, FNP  Brief History/Interval Summary: 61 y.o. female with medical history significant for colorectal cancer, HLD.  Presented with abdominal pain and distention.  Was found to have small bowel obstruction from her colonic mass.  Hospitalized for further management.    Consultants: Medical oncology.  General surgery  Procedures: Laparoscopic assisted diverting colostomy on 5/25    Subjective/Interval History: Patient mentioned that she is feeling better compared to yesterday but continues to have abdominal pain with cramps.  No nausea vomiting.  Would like the NG tube out if possible.  Denies any shortness of breath or chest pain.     Assessment/Plan:  Bowel obstruction secondary to colorectal cancer Patient with known history of colorectal cancer.  Seen by general surgery.   Underwent laparoscopic assisted diverting colostomy on 5/25.  Defer management to general surgery.  Defer NG tube to general surgery.  Pain could be controlled better.  She is on multiple as needed morphine.  Looks like general surgery has added scheduled intravenous acetaminophen.    Colorectal cancer with metastases Followed by medical oncology.  Diagnosed in April 2023.  She is status post 1 cycle of FOLFOX.  Medical oncology is following.  Chest x-ray showed that Port-A-Cath was in good position.  Neutropenia Probably secondary to chemotherapeutic infusion that she received on May 15.  WBC and neutrophil counts remain low.  Remains afebrile.  If she start spiking fever she will need to be placed on broad-spectrum antibiotics along with further evaluation including blood cultures.  Will defer marrow stimulating agents to medical oncology.    L1 vertebral compression fracture Does not appear to be particularly symptomatic from this.  PT and OT evaluation after surgery.  Hypokalemia Potassium  level has improved.  Magnesium is 2.0.  Hypophosphatemia Potassium phosphate ordered intravenously.  Hyponatremia Improved.  DVT Prophylaxis: Initiate Lovenox Code Status: Full code Family Communication: Discussed with patient Disposition Plan: To be determined  Status is: Inpatient Remains inpatient appropriate because: Small bowel obstruction      Medications: Scheduled:  Chlorhexidine Gluconate Cloth  6 each Topical Daily   lip balm  1 application. Topical BID   mouth rinse  15 mL Mouth Rinse BID   Continuous:  sodium chloride 1,000 mL (03/13/22 0936)   acetaminophen     lactated ringers     methocarbamol (ROBAXIN) IV     ondansetron (ZOFRAN) IV     potassium PHOSPHATE IVPB (in mmol) 30 mmol (03/13/22 0745)   VOH:YWVP & mag hydroxide-simeth, diphenhydrAMINE, lactated ringers, magic mouthwash, menthol-cetylpyridinium, morphine injection, ondansetron (ZOFRAN) IV **OR** ondansetron (ZOFRAN) IV, phenol, prochlorperazine, simethicone, sodium chloride flush  Antibiotics: Anti-infectives (From admission, onward)    Start     Dose/Rate Route Frequency Ordered Stop   03/12/22 0600  cefoTEtan (CEFOTAN) 2 g in sodium chloride 0.9 % 100 mL IVPB        2 g 200 mL/hr over 30 Minutes Intravenous On call to O.R. 03/11/22 1508 03/12/22 1032   03/11/22 2000  neomycin (MYCIFRADIN) tablet 1,000 mg       See Hyperspace for full Linked Orders Report.   1,000 mg Oral 3 times per day 03/11/22 1508 03/12/22 1459   03/11/22 2000  metroNIDAZOLE (FLAGYL) tablet 1,000 mg       See Hyperspace for full Linked Orders Report.   1,000 mg Oral 3 times per day 03/11/22 1508 03/12/22 1459  Objective:  Vital Signs  Vitals:   03/12/22 1500 03/12/22 1518 03/12/22 2153 03/13/22 0618  BP: 125/73 125/71 128/75 120/68  Pulse: (!) 104 96 100 (!) 105  Resp: (!) '21  16 18  '$ Temp: 98 F (36.7 C) 98.1 F (36.7 C) 99.3 F (37.4 C) 99 F (37.2 C)  TempSrc:  Oral Oral Oral  SpO2: 98% 97% 92%  91%    Intake/Output Summary (Last 24 hours) at 03/13/2022 0940 Last data filed at 03/13/2022 0740 Gross per 24 hour  Intake 2952.61 ml  Output 2780 ml  Net 172.61 ml    There were no vitals filed for this visit.  General appearance: Awake alert.  In no distress. in moderate discomfort. Resp: Clear to auscultation bilaterally.  Normal effort Cardio: S1-S2 is normal regular.  No S3-S4.  No rubs murmurs or bruit GI: Abdomen is soft.  Ostomy is noted with liquid stool.  Bowel sounds present.  Tender to palpate without any rebound rigidity or guarding. Extremities: No edema.  Moving all of her extremities Neurologic: Alert and oriented x3.  No focal neurological deficits.     Lab Results:  Data Reviewed: I have personally reviewed following labs and reports of the imaging studies  CBC: Recent Labs  Lab 03/11/22 1057 03/12/22 0921 03/13/22 0549  WBC 3.2* 1.0* 1.4*  NEUTROABS  --  0.6* 0.6*  HGB 14.0 12.9 10.0*  HCT 40.1 37.1 28.2*  MCV 87.9 87.1 87.9  PLT 206 220 180     Basic Metabolic Panel: Recent Labs  Lab 03/11/22 1057 03/12/22 0921 03/13/22 0549  NA 133* 133* 135  K 3.2* 3.5 3.6  CL 94* 101 103  CO2 '27 23 24  '$ GLUCOSE 102* 110* 103*  BUN 9 12 7*  CREATININE 0.63 0.59 0.61  CALCIUM 8.9 8.3* 8.2*  MG 2.3  --  2.0  PHOS  --   --  2.1*     GFR: Estimated Creatinine Clearance: 68.3 mL/min (by C-G formula based on SCr of 0.61 mg/dL).  Liver Function Tests: Recent Labs  Lab 03/11/22 1057 03/12/22 0921  AST 29 30  ALT 28 30  ALKPHOS 121 125  BILITOT 0.9 1.1  PROT 6.9 6.0*  ALBUMIN 3.7 3.1*     Recent Labs  Lab 03/11/22 1057  LIPASE 24     HbA1C: Recent Labs    03/11/22 1057  HGBA1C 5.5     Radiology Studies: CT ABDOMEN PELVIS WO CONTRAST  Result Date: 03/11/2022 CLINICAL DATA:  Abdominal pain and bloating for several days. No bowel movement for 4 days. History of metastatic rectal cancer. * Tracking Code: BO * EXAM: CT ABDOMEN AND  PELVIS WITHOUT CONTRAST TECHNIQUE: Multidetector CT imaging of the abdomen and pelvis was performed following the standard protocol without IV contrast. RADIATION DOSE REDUCTION: This exam was performed according to the departmental dose-optimization program which includes automated exposure control, adjustment of the mA and/or kV according to patient size and/or use of iterative reconstruction technique. COMPARISON:  03/01/2022 MRI abdomen.  02/08/2022 CT abdomen/pelvis. FINDINGS: Lower chest: No acute abnormality at the lung bases. Mildly prominent 0.8 cm right pericardiophrenic lymph node (series 2/image 10), increased from 0.6 cm on 02/08/2022 CT. Partially visualized bilateral breast prostheses. Hepatobiliary: Several (greater than 5) ill-defined hypodense liver masses scattered throughout the liver, appearing mildly increased since 02/08/2022 CT. Representative 2.4 cm posterior right liver mass (series 2/image 17), increased from 1.8 cm. Central right liver 1.5 cm mass (series 2/image 12), mildly increased from 1.2  cm. Anterior left liver 1.3 cm mass (series 2/image 13), increased from 1.1 cm. Normal gallbladder with no radiopaque cholelithiasis. No biliary ductal dilatation. Pancreas: Normal, with no mass or duct dilation. Spleen: Normal size. No mass. Adrenals/Urinary Tract: Normal adrenals. No renal stones. No hydronephrosis. Simple 1.4 cm medial interpolar right renal cyst, for which no follow-up is recommended. Otherwise no contour deforming renal masses. Normal bladder. Stomach/Bowel: Normal non-distended stomach. No dilated small bowel loops. Scattered air-fluid levels in the distal small bowel. No small bowel wall thickening. Mildly dilated fluid-filled appendix without appendiceal wall thickening or significant periappendiceal fat stranding. Annular wall thickening at the rectosigmoid junction involving an approximately 8 cm in length segment, proximal to which the colon is prominently distended with  widespread colonic air-fluid levels. Cecal diameter 8.8 cm. Vascular/Lymphatic: Normal caliber abdominal aorta. Left para-lymphadenopathy up to 1.0 cm (series 2/image 36), increased from 0.7 cm. Multiple enlarged left perirectal nodes, largest 1.6 cm short axis diameter (series 2/image 65), stable. Reproductive: Status post hysterectomy, with no abnormal findings at the vaginal cuff. No adnexal mass. Other: No pneumoperitoneum. Small volume pelvic ascites. Presacral space ill-defined fluid and perirectal fat stranding is mildly increased. No focal fluid collection. Musculoskeletal: Moderate L1 vertebral compression fracture is new since 02/08/2022 CT, without definite discrete osseous lesions. Mild thoracolumbar spondylosis. IMPRESSION: 1. Distal large bowel obstruction at the site of a large annular mass at the rectosigmoid junction. 2. Progressive liver metastases. 3. Progressive perirectal, left para-aortic and right pericardiophrenic nodal metastases. 4. Small volume pelvic ascites. 5. Moderate L1 vertebral compression fracture, new since 02/08/2022 CT, without definite underlying osseous lesion. Electronically Signed   By: Ilona Sorrel M.D.   On: 03/11/2022 13:38   DG Chest 1 View  Addendum Date: 03/12/2022   ADDENDUM REPORT: 03/12/2022 10:53 ADDENDUM: Requested addendum. The Port-A-Cath reservoir appears in expected position, does not appear flipped. Electronically Signed   By: Lavonia Dana M.D.   On: 03/12/2022 10:53   Result Date: 03/12/2022 CLINICAL DATA:  Port-A-Cath placement EXAM: CHEST  1 VIEW COMPARISON:  Portable exam 0851 hours without priors for comparison FINDINGS: RIGHT jugular Port-A-Cath with tip projecting over SVC. Normal heart size, mediastinal contours, and pulmonary vascularity. Lungs clear. No infiltrate, pleural effusion, or pneumothorax. Bones appear demineralized. IMPRESSION: No pneumothorax following RIGHT jugular Port-A-Cath insertion. Electronically Signed: By: Lavonia Dana M.D.  On: 03/12/2022 09:54   DG Abd Portable 1V  Result Date: 03/12/2022 CLINICAL DATA:  NG tube placement EXAM: PORTABLE ABDOMEN - 1 VIEW COMPARISON:  01/07/2022 FINDINGS: AP image of upper abdomen and chest shows tip nasogastric tube to be in the region of distal antrum of the stomach. There is another ostomy catheter in the left mid abdomen. Tip of right IJ chest port is seen in the superior vena cava close to the right atrium. Small linear densities in the lower lung fields suggest subsegmental atelectasis. There is mild dilation of small-bowel loops in the upper abdomen. Lower abdomen and pelvis are not included in the image. IMPRESSION: Tip enteric tube is seen in the distal antrum of the stomach. Electronically Signed   By: Elmer Picker M.D.   On: 03/12/2022 13:45       LOS: 2 days   [facility] Pager on [email or web address]  03/13/2022, 9:40 AM

## 2022-03-13 NOTE — Consult Note (Signed)
[place] Nurse ostomy follow up Patient receiving care in [place]. Spouse present and observing all steps of ostomy care.  Stoma type/location: LUQ colostomy Stomal assessment/size: 2 1/8 inches, round, sutures intact, red, moist. Malecot tube in place. I tried twice with gentle traction to remove the tube AFTER the patient was medicated with IV morphine. I could not get it out. I called K. Osbourne, PA-C to room and she was able to remove the tube. Peristomal assessment: intact Treatment options for stomal/peristomal skin: barrier ring Output: thin brown  Ostomy pouching: 1pc. Pouch Lawson [phone] and barrier ring Kellie Simmering [phone]. I have requested 6 of each to be placed in her room. Education provided:  The patient was sleepy but the spouse, Shanon Brow, watched the entire pouch emptying, removal, new system preparation and application process.  He asked questions along the way.  Enrolled patient in [place] Start Discharge program: Yes  Val Riles, RN, MSN, [facility], CNS-BC, pager [phone]

## 2022-03-13 NOTE — Progress Notes (Signed)
Date and time results received: 03/13/22 0622  Test: CBC Critical Value: WBC 1.4  Name of Provider Notified: Clarene Essex NP  Orders Received? Or Actions Taken?: Actions Taken: Being reviewed.

## 2022-03-14 DIAGNOSIS — E876 Hypokalemia: Secondary | ICD-10-CM | POA: Diagnosis not present

## 2022-03-14 DIAGNOSIS — K56609 Unspecified intestinal obstruction, unspecified as to partial versus complete obstruction: Secondary | ICD-10-CM | POA: Diagnosis not present

## 2022-03-14 DIAGNOSIS — D701 Agranulocytosis secondary to cancer chemotherapy: Secondary | ICD-10-CM | POA: Diagnosis not present

## 2022-03-14 DIAGNOSIS — S32010A Wedge compression fracture of first lumbar vertebra, initial encounter for closed fracture: Secondary | ICD-10-CM | POA: Diagnosis not present

## 2022-03-14 LAB — RETICULOCYTES
Immature Retic Fract: 14.4 % (ref 2.3–15.9)
RBC.: 3.7 MIL/uL — ABNORMAL LOW (ref 3.87–5.11)
Retic Count, Absolute: 57.7 10*3/uL (ref 19.0–186.0)
Retic Ct Pct: 1.6 % (ref 0.4–3.1)

## 2022-03-14 LAB — CBC WITH DIFFERENTIAL/PLATELET
Abs Immature Granulocytes: 0.15 10*3/uL — ABNORMAL HIGH (ref 0.00–0.07)
Basophils Absolute: 0 10*3/uL (ref 0.0–0.1)
Basophils Relative: 1 %
Eosinophils Absolute: 0.1 10*3/uL (ref 0.0–0.5)
Eosinophils Relative: 5 %
HCT: 28.1 % — ABNORMAL LOW (ref 36.0–46.0)
Hemoglobin: 9.4 g/dL — ABNORMAL LOW (ref 12.0–15.0)
Immature Granulocytes: 7 %
Lymphocytes Relative: 33 %
Lymphs Abs: 0.7 10*3/uL (ref 0.7–4.0)
MCH: 29.8 pg (ref 26.0–34.0)
MCHC: 33.5 g/dL (ref 30.0–36.0)
MCV: 89.2 fL (ref 80.0–100.0)
Monocytes Absolute: 0.4 10*3/uL (ref 0.1–1.0)
Monocytes Relative: 20 %
Neutro Abs: 0.7 10*3/uL — ABNORMAL LOW (ref 1.7–7.7)
Neutrophils Relative %: 34 %
Platelets: 185 10*3/uL (ref 150–400)
RBC: 3.15 MIL/uL — ABNORMAL LOW (ref 3.87–5.11)
RDW: 11.9 % (ref 11.5–15.5)
WBC: 2.1 10*3/uL — ABNORMAL LOW (ref 4.0–10.5)
nRBC: 0 % (ref 0.0–0.2)

## 2022-03-14 LAB — BASIC METABOLIC PANEL
Anion gap: 8 (ref 5–15)
BUN: 8 mg/dL (ref 8–23)
CO2: 25 mmol/L (ref 22–32)
Calcium: 7.9 mg/dL — ABNORMAL LOW (ref 8.9–10.3)
Chloride: 102 mmol/L (ref 98–111)
Creatinine, Ser: 0.49 mg/dL (ref 0.44–1.00)
GFR, Estimated: >60 mL/min (ref >60)
Glucose, Bld: 90 mg/dL (ref 70–99)
Potassium: 3.3 mmol/L — ABNORMAL LOW (ref 3.5–5.1)
Sodium: 135 mmol/L (ref 135–145)

## 2022-03-14 LAB — PHOSPHORUS: Phosphorus: 1.8 mg/dL — ABNORMAL LOW (ref 2.5–4.6)

## 2022-03-14 LAB — VITAMIN B12: Vitamin B-12: 1450 pg/mL — ABNORMAL HIGH (ref 180–914)

## 2022-03-14 LAB — IRON AND TIBC
Iron: 10 ug/dL — ABNORMAL LOW (ref 28–170)
Saturation Ratios: 7 % — ABNORMAL LOW (ref 10.4–31.8)
TIBC: 138 ug/dL — ABNORMAL LOW (ref 250–450)
UIBC: 128 ug/dL

## 2022-03-14 LAB — FOLATE: Folate: 15.5 ng/mL (ref >5.9)

## 2022-03-14 LAB — FERRITIN: Ferritin: 532 ng/mL — ABNORMAL HIGH (ref 11–307)

## 2022-03-14 MED ORDER — POTASSIUM PHOSPHATES 15 MMOLE/5ML IV SOLN
30.0000 mmol | Freq: Two times a day (BID) | INTRAVENOUS | Status: AC
  Administered 2022-03-14 (×2): 30 mmol via INTRAVENOUS
  Filled 2022-03-14 (×2): qty 10

## 2022-03-14 MED ORDER — KCL IN DEXTROSE-NACL 20-5-0.45 MEQ/L-%-% IV SOLN
INTRAVENOUS | Status: DC
  Filled 2022-03-14 (×13): qty 1000

## 2022-03-14 NOTE — Progress Notes (Signed)
[facility] PROGRESS NOTE   Karen Kaufman WER:[id number] DOB: May 19, 1961 DOA: 03/11/2022  PCP: Gwenlyn Perking, FNP  Brief History/Interval Summary: 61 y.o. female with medical history significant for colorectal cancer, HLD.  Presented with abdominal pain and distention.  Was found to have small bowel obstruction from her colonic mass.  Hospitalized for further management.    Consultants: Medical oncology.  General surgery  Procedures: Laparoscopic assisted diverting colostomy on 5/25    Subjective/Interval History: Patient mentions that her abdominal pain is much better compared to yesterday.  Complaining of some soreness in her throat from the NG tube.  Denies any other complaints at this time.  Specifically no chest pain or shortness of breath.   Assessment/Plan:  Bowel obstruction secondary to colorectal cancer Patient with known history of colorectal cancer.  Seen by general surgery.   Underwent laparoscopic assisted diverting colostomy on 5/25.  Defer management to general surgery.  Defer NG tube to general surgery.      Colorectal cancer with metastases Followed by medical oncology.  Diagnosed in April 2023.  She is status post 1 cycle of FOLFOX.  Medical oncology is following.  Chest x-ray showed that Port-A-Cath was in good position.  Neutropenia Probably secondary to chemotherapeutic infusion that she received on May 15.   If she start spiking fever she will need to be placed on broad-spectrum antibiotics along with further evaluation including blood cultures.  Will defer marrow stimulating agents to medical oncology.   WBC noted to be slightly higher today.  Continue to monitor on a daily basis.  Normocytic anemia Drop in hemoglobin also most likely due to combination of chemotherapy as well as surgery.  No overt blood loss noted otherwise. Check anemia panel in the morning.  L1 vertebral compression fracture Does not appear to be particularly symptomatic  from this.  PT and OT evaluation.  Hypokalemia Replace potassium.  Magnesium was 2.0 yesterday.  Hypophosphatemia Phosphorus supplementation ordered.  Hyponatremia Improved.  DVT Prophylaxis: Lovenox Code Status: Full code Family Communication: Discussed with patient Disposition Plan: To be determined  Status is: Inpatient Remains inpatient appropriate because: Small bowel obstruction      Medications: Scheduled:  Chlorhexidine Gluconate Cloth  6 each Topical Daily   enoxaparin (LOVENOX) injection  40 mg Subcutaneous Q24H   lip balm  1 application. Topical BID   mouth rinse  15 mL Mouth Rinse BID   Continuous:  dextrose 5 % and 0.45 % NaCl with KCl 20 mEq/L 75 mL/hr at 03/14/22 0830   lactated ringers     methocarbamol (ROBAXIN) IV 1,000 mg (03/14/22 0554)   ondansetron (ZOFRAN) IV     potassium PHOSPHATE IVPB (in mmol)     PPJ:KDTO & mag hydroxide-simeth, diphenhydrAMINE, lactated ringers, magic mouthwash, menthol-cetylpyridinium, morphine injection, ondansetron (ZOFRAN) IV **OR** ondansetron (ZOFRAN) IV, phenol, prochlorperazine, simethicone, sodium chloride flush  Antibiotics: Anti-infectives (From admission, onward)    Start     Dose/Rate Route Frequency Ordered Stop   03/12/22 0600  cefoTEtan (CEFOTAN) 2 g in sodium chloride 0.9 % 100 mL IVPB        2 g 200 mL/hr over 30 Minutes Intravenous On call to O.R. 03/11/22 1508 03/12/22 1032   03/11/22 2000  neomycin (MYCIFRADIN) tablet 1,000 mg       See Hyperspace for full Linked Orders Report.   1,000 mg Oral 3 times per day 03/11/22 1508 03/12/22 1459   03/11/22 2000  metroNIDAZOLE (FLAGYL) tablet 1,000 mg  See Hyperspace for full Linked Orders Report.   1,000 mg Oral 3 times per day 03/11/22 1508 03/12/22 1459       Objective:  Vital Signs  Vitals:   03/13/22 2023 03/13/22 2114 03/14/22 0144 03/14/22 0558  BP: 117/67 115/74 104/63 98/66  Pulse: 86 89 78 89  Resp: '16 16 16 18  '$ Temp: (!) 97.3 F  (36.3 C) 98.4 F (36.9 C) 98.6 F (37 C) 99.5 F (37.5 C)  TempSrc: Oral Oral Oral Oral  SpO2: 93% 95% 95% 96%    Intake/Output Summary (Last 24 hours) at 03/14/2022 0831 Last data filed at 03/14/2022 0600 Gross per 24 hour  Intake 2506.98 ml  Output 2175 ml  Net 331.98 ml    There were no vitals filed for this visit.  General appearance: Awake alert.  In no distress NG tube noted Resp: Clear to auscultation bilaterally.  Normal effort Cardio: S1-S2 is normal regular.  No S3-S4.  No rubs murmurs or bruit GI: Abdomen is soft.  Bowel sounds present.  Deferred palpation and evaluation of ostomy to general surgery. Extremities: No edema.  Moving all 4 extremities Neurologic: Alert and oriented x3.  No focal neurological deficits.    Lab Results:  Data Reviewed: I have personally reviewed following labs and reports of the imaging studies  CBC: Recent Labs  Lab 03/11/22 1057 03/12/22 0921 03/13/22 0549 03/14/22 0404  WBC 3.2* 1.0* 1.4* 2.1*  NEUTROABS  --  0.6* 0.6* 0.7*  HGB 14.0 12.9 10.0* 9.4*  HCT 40.1 37.1 28.2* 28.1*  MCV 87.9 87.1 87.9 89.2  PLT 206 220 180 185     Basic Metabolic Panel: Recent Labs  Lab 03/11/22 1057 03/12/22 0921 03/13/22 0549 03/14/22 0404  NA 133* 133* 135 135  K 3.2* 3.5 3.6 3.3*  CL 94* 101 103 102  CO2 '27 23 24 25  '$ GLUCOSE 102* 110* 103* 90  BUN 9 12 7* 8  CREATININE 0.63 0.59 0.61 0.49  CALCIUM 8.9 8.3* 8.2* 7.9*  MG 2.3  --  2.0  --   PHOS  --   --  2.1* 1.8*     GFR: Estimated Creatinine Clearance: 68.3 mL/min (by C-G formula based on SCr of 0.49 mg/dL).  Liver Function Tests: Recent Labs  Lab 03/11/22 1057 03/12/22 0921  AST 29 30  ALT 28 30  ALKPHOS 121 125  BILITOT 0.9 1.1  PROT 6.9 6.0*  ALBUMIN 3.7 3.1*     Recent Labs  Lab 03/11/22 1057  LIPASE 24     HbA1C: Recent Labs    03/11/22 1057  HGBA1C 5.5     Radiology Studies: DG Chest 1 View  Addendum Date: 03/12/2022   ADDENDUM REPORT:  03/12/2022 10:53 ADDENDUM: Requested addendum. The Port-A-Cath reservoir appears in expected position, does not appear flipped. Electronically Signed   By: Lavonia Dana M.D.   On: 03/12/2022 10:53   Result Date: 03/12/2022 CLINICAL DATA:  Port-A-Cath placement EXAM: CHEST  1 VIEW COMPARISON:  Portable exam 0851 hours without priors for comparison FINDINGS: RIGHT jugular Port-A-Cath with tip projecting over SVC. Normal heart size, mediastinal contours, and pulmonary vascularity. Lungs clear. No infiltrate, pleural effusion, or pneumothorax. Bones appear demineralized. IMPRESSION: No pneumothorax following RIGHT jugular Port-A-Cath insertion. Electronically Signed: By: Lavonia Dana M.D. On: 03/12/2022 09:54   DG Abd Portable 1V  Result Date: 03/12/2022 CLINICAL DATA:  NG tube placement EXAM: PORTABLE ABDOMEN - 1 VIEW COMPARISON:  01/07/2022 FINDINGS: AP image of upper abdomen and  chest shows tip nasogastric tube to be in the region of distal antrum of the stomach. There is another ostomy catheter in the left mid abdomen. Tip of right IJ chest port is seen in the superior vena cava close to the right atrium. Small linear densities in the lower lung fields suggest subsegmental atelectasis. There is mild dilation of small-bowel loops in the upper abdomen. Lower abdomen and pelvis are not included in the image. IMPRESSION: Tip enteric tube is seen in the distal antrum of the stomach. Electronically Signed   By: Elmer Picker M.D.   On: 03/12/2022 13:45       LOS: 3 days   [facility] Pager on [email or web address]  03/14/2022, 8:31 AM

## 2022-03-14 NOTE — Progress Notes (Signed)
PT Cancellation Note  Patient Details Name: Alesana Magistro MRN: [id number] DOB: 1961-03-19   Cancelled Treatment:    Reason Eval/Treat Not Completed: Other (comment)Checked on patient x 2, was in recliner earlier and not ready, now has just returned too bed. Will check back another time. [place] Pager [phone] Office [phone]    Claretha Cooper 03/14/2022, 2:18 PM

## 2022-03-14 NOTE — Progress Notes (Signed)
2 Days Post-Op   Subjective/Chief Complaint: Pain is better controlled today Less distended NG tube 600 cc output Ostomy output - thin 225 cc No nausea   Objective: Vital signs in last 24 hours: Temp:  [97.3 F (36.3 C)-99.5 F (37.5 C)] 99.5 F (37.5 C) (05/27 0558) Pulse Rate:  [78-99] 89 (05/27 0558) Resp:  [16-18] 18 (05/27 0558) BP: (98-117)/(63-74) 98/66 (05/27 0558) SpO2:  [93 %-96 %] 96 % (05/27 0558) Last BM Date : 03/13/22  Intake/Output from previous day: 05/26 0701 - 05/27 0700 In: 2507 [P.O.:90; I.V.:1281.5; IV Piggyback:1135.5] Out: 7915 [Urine:2250; Emesis/NG output:600; Stool:225] Intake/Output this shift: No intake/output data recorded.  Gen:  Alert, NAD; appears comfortable Pulm: rate and effort normal on room air Abd: soft, mild distension, appropriately tender, lap incisions with cdi dressings, ostomy viable with liquid brown stool in pouch  Lab Results:  Recent Labs    03/13/22 0549 03/14/22 0404  WBC 1.4* 2.1*  HGB 10.0* 9.4*  HCT 28.2* 28.1*  PLT 180 185   BMET Recent Labs    03/13/22 0549 03/14/22 0404  NA 135 135  K 3.6 3.3*  CL 103 102  CO2 24 25  GLUCOSE 103* 90  BUN 7* 8  CREATININE 0.61 0.49  CALCIUM 8.2* 7.9*   PT/INR No results for input(s): LABPROT, INR in the last 72 hours. ABG No results for input(s): PHART, HCO3 in the last 72 hours.  Invalid input(s): PCO2, PO2  Studies/Results: DG Abd Portable 1V  Result Date: 03/12/2022 CLINICAL DATA:  NG tube placement EXAM: PORTABLE ABDOMEN - 1 VIEW COMPARISON:  01/07/2022 FINDINGS: AP image of upper abdomen and chest shows tip nasogastric tube to be in the region of distal antrum of the stomach. There is another ostomy catheter in the left mid abdomen. Tip of right IJ chest port is seen in the superior vena cava close to the right atrium. Small linear densities in the lower lung fields suggest subsegmental atelectasis. There is mild dilation of small-bowel loops in the upper  abdomen. Lower abdomen and pelvis are not included in the image. IMPRESSION: Tip enteric tube is seen in the distal antrum of the stomach. Electronically Signed   By: Elmer Picker M.D.   On: 03/12/2022 13:45    Anti-infectives: Anti-infectives (From admission, onward)    Start     Dose/Rate Route Frequency Ordered Stop   03/12/22 0600  cefoTEtan (CEFOTAN) 2 g in sodium chloride 0.9 % 100 mL IVPB        2 g 200 mL/hr over 30 Minutes Intravenous On call to O.R. 03/11/22 1508 03/12/22 1032   03/11/22 2000  neomycin (MYCIFRADIN) tablet 1,000 mg       See Hyperspace for full Linked Orders Report.   1,000 mg Oral 3 times per day 03/11/22 1508 03/12/22 1459   03/11/22 2000  metroNIDAZOLE (FLAGYL) tablet 1,000 mg       See Hyperspace for full Linked Orders Report.   1,000 mg Oral 3 times per day 03/11/22 1508 03/12/22 1459       Assessment/Plan: Large bowel obstruction secondary to Metastatic colorectal cancer with liver mets and LN mets, on chemo -POD#2 s/p LAPAROSCOPIC ASSISTED DIVERTING COLOSTOMY 5/25 Dr. Johney Maine - discontinue NG tube - ice chips/ sips of water - WOC consult for new ostomy.  - schedule IV tylenol and IV robaxin for better pain control - mobilize, will ask PT to see   FEN: IVF, NPO/NGT to LIWS VTE: SCDs, ok for chemical DVT prophylaxis  from surgical standpoint ID: none currently Foley: removed 5/26    Anemia Neutropenia L1 vertebral compression fx   LOS: 3 days    Karen Kaufman 03/14/2022

## 2022-03-15 ENCOUNTER — Other Ambulatory Visit: Payer: Self-pay | Admitting: Oncology

## 2022-03-15 DIAGNOSIS — E876 Hypokalemia: Secondary | ICD-10-CM | POA: Diagnosis not present

## 2022-03-15 DIAGNOSIS — D701 Agranulocytosis secondary to cancer chemotherapy: Secondary | ICD-10-CM | POA: Diagnosis not present

## 2022-03-15 DIAGNOSIS — K56609 Unspecified intestinal obstruction, unspecified as to partial versus complete obstruction: Secondary | ICD-10-CM | POA: Diagnosis not present

## 2022-03-15 DIAGNOSIS — S32010A Wedge compression fracture of first lumbar vertebra, initial encounter for closed fracture: Secondary | ICD-10-CM | POA: Diagnosis not present

## 2022-03-15 LAB — CBC WITH DIFFERENTIAL/PLATELET
Abs Immature Granulocytes: 0.04 10*3/uL (ref 0.00–0.07)
Basophils Absolute: 0 10*3/uL (ref 0.0–0.1)
Basophils Relative: 0 %
Eosinophils Absolute: 0.1 10*3/uL (ref 0.0–0.5)
Eosinophils Relative: 5 %
HCT: 30.2 % — ABNORMAL LOW (ref 36.0–46.0)
Hemoglobin: 10.2 g/dL — ABNORMAL LOW (ref 12.0–15.0)
Immature Granulocytes: 2 %
Lymphocytes Relative: 27 %
Lymphs Abs: 0.7 10*3/uL (ref 0.7–4.0)
MCH: 30.4 pg (ref 26.0–34.0)
MCHC: 33.8 g/dL (ref 30.0–36.0)
MCV: 90.1 fL (ref 80.0–100.0)
Monocytes Absolute: 0.7 10*3/uL (ref 0.1–1.0)
Monocytes Relative: 25 %
Neutro Abs: 1.1 10*3/uL — ABNORMAL LOW (ref 1.7–7.7)
Neutrophils Relative %: 41 %
Platelets: 262 10*3/uL (ref 150–400)
RBC: 3.35 MIL/uL — ABNORMAL LOW (ref 3.87–5.11)
RDW: 12.2 % (ref 11.5–15.5)
WBC: 2.6 10*3/uL — ABNORMAL LOW (ref 4.0–10.5)
nRBC: 0 % (ref 0.0–0.2)

## 2022-03-15 LAB — BASIC METABOLIC PANEL
Anion gap: 7 (ref 5–15)
BUN: <5 mg/dL — ABNORMAL LOW (ref 8–23)
CO2: 26 mmol/L (ref 22–32)
Calcium: 7.8 mg/dL — ABNORMAL LOW (ref 8.9–10.3)
Chloride: 98 mmol/L (ref 98–111)
Creatinine, Ser: 0.48 mg/dL (ref 0.44–1.00)
GFR, Estimated: >60 mL/min (ref >60)
Glucose, Bld: 141 mg/dL — ABNORMAL HIGH (ref 70–99)
Potassium: 3.7 mmol/L (ref 3.5–5.1)
Sodium: 131 mmol/L — ABNORMAL LOW (ref 135–145)

## 2022-03-15 LAB — MAGNESIUM: Magnesium: 2 mg/dL (ref 1.7–2.4)

## 2022-03-15 LAB — PHOSPHORUS: Phosphorus: 3.3 mg/dL (ref 2.5–4.6)

## 2022-03-15 MED ORDER — LIP MEDEX EX OINT: TOPICAL_OINTMENT | CUTANEOUS | Status: DC | PRN

## 2022-03-15 NOTE — Evaluation (Signed)
Physical Therapy Evaluation Patient Details Name: Karen Kaufman MRN: [id number] DOB: [birth year] Today's Date: 03/15/2022  History of Present Illness  Patient has a history of recently diagnosed metastatic colorectal cancer.  Patient had a CT scan on April 23 that showed a circumferential wall thickening around her sigmoid colon and rectum concerning for rectal cancer.  She ended up having MRI that showed evidence of liver metastases.S/P LAPAROSCOPIC ASSISTED DIVERTING COLOSTOMYon 03/12/22,  Clinical Impression  Pt admitted as above and presenting with functional mobility limitations 2* post op pain, mild ambulatory balance deficits and limited endurance.  Pt should progress to dc home with family assist.     Recommendations for follow up therapy are one component of a multi-disciplinary discharge planning process, led by the attending physician.  Recommendations may be updated based on patient status, additional functional criteria and insurance authorization.  Follow Up Recommendations No PT follow up    Assistance Recommended at Discharge Intermittent Supervision/Assistance  Patient can return home with the following  A little help with walking and/or transfers;A lot of help with bathing/dressing/bathroom;Assist for transportation;Help with stairs or ramp for entrance    Equipment Recommendations None recommended by PT  Recommendations for Other Services       Functional Status Assessment Patient has had a recent decline in their functional status and demonstrates the ability to make significant improvements in function in a reasonable and predictable amount of time.     Precautions / Restrictions Precautions Precautions: Fall Precaution Comments: abdominal surgery Restrictions Weight Bearing Restrictions: No      Mobility  Bed Mobility Overal bed mobility: Needs Assistance Bed Mobility: Sit to Supine       Sit to supine: Min assist   General bed mobility comments: with  education on how to log roll to avoid pain in back and abdomen.    Transfers Overall transfer level: Needs assistance Equipment used: Rolling walker (2 wheels) Transfers: Sit to/from Stand Sit to Stand: Min guard           General transfer comment: steady assist only to stand from chair    Ambulation/Gait Ambulation/Gait assistance: Min guard, Supervision Gait Distance (Feet): 280 Feet Assistive device: Rolling walker (2 wheels) Gait Pattern/deviations: Step-through pattern, Trunk flexed, Shuffle       General Gait Details: min cues for posture and position from [employer]            Wheelchair Mobility    Modified Rankin (Stroke Patients Only)       Balance Overall balance assessment: Mild deficits observed, not formally tested                                           Pertinent Vitals/Pain Pain Assessment Pain Assessment: 0-10 Pain Score: 8  Pain Location: abdominal area Pain Descriptors / Indicators: Discomfort, Operative site guarding, Grimacing Pain Intervention(s): Limited activity within patient's tolerance    Home Living Family/patient expects to be discharged to:: Private residence Living Arrangements: Spouse/significant other Available Help at Discharge: Family;Available 24 hours/day Type of Home: House Home Access: Stairs to enter   Entrance Stairs-Number of Steps: 3     Home Equipment: Conservation officer, nature (2 wheels);Rollator (4 wheels)      Prior Function Prior Level of Function : Independent/Modified Independent                     Hand  Dominance        Extremity/Trunk Assessment   Upper Extremity Assessment Upper Extremity Assessment: Overall WFL for tasks assessed    Lower Extremity Assessment Lower Extremity Assessment: Overall WFL for tasks assessed    Cervical / Trunk Assessment Cervical / Trunk Assessment: Normal  Communication   Communication: No difficulties  Cognition Arousal/Alertness:  Awake/alert Behavior During Therapy: WFL for tasks assessed/performed Overall Cognitive Status: Within Functional Limits for tasks assessed                                          General Comments      Exercises     Assessment/Plan    PT Assessment Patient needs continued PT services  PT Problem List Decreased activity tolerance;Decreased balance;Decreased mobility;Pain       PT Treatment Interventions DME instruction;Gait training;Stair training;Functional mobility training;Therapeutic activities;Therapeutic exercise;Patient/family education    PT Goals (Current goals can be found in the Care Plan section)  Acute Rehab PT Goals Patient Stated Goal: Regain PT Goal Formulation: With patient Time For Goal Achievement: 03/15/22 Potential to Achieve Goals: Good    Frequency Min 3X/week     Co-evaluation               AM-PAC PT "6 Clicks" Mobility  Outcome Measure Help needed turning from your back to your side while in a flat bed without using bedrails?: A Little Help needed moving from lying on your back to sitting on the side of a flat bed without using bedrails?: A Little Help needed moving to and from a bed to a chair (including a wheelchair)?: A Little Help needed standing up from a chair using your arms (e.g., wheelchair or bedside chair)?: A Little Help needed to walk in hospital room?: A Little Help needed climbing 3-5 steps with a railing? : A Little 6 Click Score: 18    End of Session Equipment Utilized During Treatment: Gait belt Activity Tolerance: Patient tolerated treatment well Patient left: Other (comment) (bathroom with OT) Nurse Communication: Mobility status;Patient requests pain meds PT Visit Diagnosis: Unsteadiness on feet (R26.81)    Time: 5625-6389 PT Time Calculation (min) (ACUTE ONLY): 23 min   Charges:   PT Evaluation $PT Eval Low Complexity: 1 Low          Debe Coder PT Acute Rehabilitation  Services Pager [phone] Office [phone]   Robertson Colclough 03/15/2022, 3:14 PM

## 2022-03-15 NOTE — Progress Notes (Signed)
[facility] PROGRESS NOTE   Karen Kaufman FMB:[id number] DOB: 21-Sep-1961 DOA: 03/11/2022  PCP: Gwenlyn Perking, FNP  Brief History/Interval Summary: 61 y.o. female with medical history significant for colorectal cancer, HLD.  Presented with abdominal pain and distention.  Was found to have small bowel obstruction from her colonic mass.  Hospitalized for further management.    Consultants: Medical oncology.  General surgery  Procedures: Laparoscopic assisted diverting colostomy on 5/25    Subjective/Interval History: Patient continues to have some abdominal cramping and some pain in the upper abdomen.  Denies any nausea vomiting.  No chest pain or shortness of breath.     Assessment/Plan:  Bowel obstruction secondary to colorectal cancer Patient with known history of colorectal cancer.  Seen by general surgery.   Underwent laparoscopic assisted diverting colostomy on 5/25.  Defer management to general surgery.  NG tube was removed yesterday.  Colorectal cancer with metastases Followed by medical oncology.  Diagnosed in April 2023.  She is status post 1 cycle of FOLFOX.  Medical oncology is following.  Chest x-ray showed that Port-A-Cath was in good position.  Neutropenia Probably secondary to chemotherapeutic infusion that she received on May 15.   If she starts spiking fever she will need to be placed on broad-spectrum antibiotics along with further evaluation including blood cultures.  Will defer marrow stimulating agents to medical oncology.   WBC seems to be slowly improving.  Neutrophil counts have also improved.  Remains afebrile.  Continue to monitor.  Normocytic anemia Drop in hemoglobin also most likely due to combination of chemotherapy as well as surgery.  No overt blood loss noted otherwise. Anemia panel reviewed.  No clear-cut deficiencies identified.  L1 vertebral compression fracture Does not appear to be particularly symptomatic from this.  PT and OT  evaluation.  Hypokalemia Potassium improved to 3.7.  Magnesium 2.0.  Hypophosphatemia Phosphorus supplementation ordered.  Recheck phosphorus levels.  Hyponatremia Stable.  Continue to monitor resolved.  DVT Prophylaxis: Lovenox Code Status: Full code Family Communication: Discussed with patient Disposition Plan: To be determined  Status is: Inpatient Remains inpatient appropriate because: Small bowel obstruction      Medications: Scheduled:  Chlorhexidine Gluconate Cloth  6 each Topical Daily   enoxaparin (LOVENOX) injection  40 mg Subcutaneous Q24H   lip balm  1 application. Topical BID   mouth rinse  15 mL Mouth Rinse BID   Continuous:  dextrose 5 % and 0.45 % NaCl with KCl 20 mEq/L 75 mL/hr at 03/15/22 0757   methocarbamol (ROBAXIN) IV Stopped (03/15/22 0616)   ondansetron (ZOFRAN) IV     TSV:XBLT & mag hydroxide-simeth, diphenhydrAMINE, magic mouthwash, menthol-cetylpyridinium, morphine injection, ondansetron (ZOFRAN) IV **OR** ondansetron (ZOFRAN) IV, phenol, prochlorperazine, simethicone, sodium chloride flush  Antibiotics: Anti-infectives (From admission, onward)    Start     Dose/Rate Route Frequency Ordered Stop   03/12/22 0600  cefoTEtan (CEFOTAN) 2 g in sodium chloride 0.9 % 100 mL IVPB        2 g 200 mL/hr over 30 Minutes Intravenous On call to O.R. 03/11/22 1508 03/12/22 1032   03/11/22 2000  neomycin (MYCIFRADIN) tablet 1,000 mg       See Hyperspace for full Linked Orders Report.   1,000 mg Oral 3 times per day 03/11/22 1508 03/12/22 1459   03/11/22 2000  metroNIDAZOLE (FLAGYL) tablet 1,000 mg       See Hyperspace for full Linked Orders Report.   1,000 mg Oral 3 times per day 03/11/22 1508 03/12/22  1459       Objective:  Vital Signs  Vitals:   03/14/22 0934 03/14/22 1403 03/14/22 2201 03/15/22 0519  BP: 121/65 123/69 133/67 123/76  Pulse: 82 78 81 77  Resp: '14 14 18 20  '$ Temp: 98.9 F (37.2 C) 97.6 F (36.4 C) (!) 97.5 F (36.4 C) (!)  97.4 F (36.3 C)  TempSrc: Oral Oral Oral Oral  SpO2: 97% 100% 99% 97%    Intake/Output Summary (Last 24 hours) at 03/15/2022 1601 Last data filed at 03/15/2022 0757 Gross per 24 hour  Intake 2131.05 ml  Output 1075 ml  Net 1056.05 ml    There were no vitals filed for this visit.  General appearance: Awake alert.  In no distress Resp: Normal effort at rest.  Diminished air entry at the bases.  No wheezing or rhonchi. Cardio: S1-S2 is normal regular.  No S3-S4.  No rubs murmurs or bruit GI: Abdomen is soft.  Ostomy is noted.  Bowel sounds present but sluggish. Extremities: No edema.  Moving all of her extremities Neurologic: Alert and oriented x3.  No focal neurological deficits.     Lab Results:  Data Reviewed: I have personally reviewed following labs and reports of the imaging studies  CBC: Recent Labs  Lab 03/11/22 1057 03/12/22 0921 03/13/22 0549 03/14/22 0404 03/15/22 0335  WBC 3.2* 1.0* 1.4* 2.1* 2.6*  NEUTROABS  --  0.6* 0.6* 0.7* 1.1*  HGB 14.0 12.9 10.0* 9.4* 10.2*  HCT 40.1 37.1 28.2* 28.1* 30.2*  MCV 87.9 87.1 87.9 89.2 90.1  PLT 206 220 180 185 262     Basic Metabolic Panel: Recent Labs  Lab 03/11/22 1057 03/12/22 0921 03/13/22 0549 03/14/22 0404 03/15/22 0335  NA 133* 133* 135 135 131*  K 3.2* 3.5 3.6 3.3* 3.7  CL 94* 101 103 102 98  CO2 '27 23 24 25 26  '$ GLUCOSE 102* 110* 103* 90 141*  BUN 9 12 7* 8 <5*  CREATININE 0.63 0.59 0.61 0.49 0.48  CALCIUM 8.9 8.3* 8.2* 7.9* 7.8*  MG 2.3  --  2.0  --  2.0  PHOS  --   --  2.1* 1.8*  --      GFR: Estimated Creatinine Clearance: 68.3 mL/min (by C-G formula based on SCr of 0.48 mg/dL).  Liver Function Tests: Recent Labs  Lab 03/11/22 1057 03/12/22 0921  AST 29 30  ALT 28 30  ALKPHOS 121 125  BILITOT 0.9 1.1  PROT 6.9 6.0*  ALBUMIN 3.7 3.1*     Recent Labs  Lab 03/11/22 1057  LIPASE 24       Radiology Studies: No results found.     LOS: 4 days   Mallori Araque [employer] on [email or web address]  03/15/2022, 8:32 AM

## 2022-03-15 NOTE — Evaluation (Signed)
Occupational Therapy Evaluation Patient Details Name: Karen Kaufman MRN: [id number] DOB: 02-20-1961 Today's Date: 03/15/2022   History of Present Illness Patient has a history of recently diagnosed metastatic colorectal cancer.  Patient had a CT scan on April 23 that showed a circumferential wall thickening around her sigmoid colon and rectum concerning for rectal cancer.  She ended up having MRI that showed evidence of liver metastases.S/P LAPAROSCOPIC ASSISTED DIVERTING COLOSTOMYon 03/12/22,   Clinical Impression   Patient is a 61 year old female who was admitted for the above. Patient was living at home independently with no AD prior level. Patient was noted to have increased pain and decreased functional activity tolerance impacting participation in ADLs. Patient was noted to have increased difficulty with LB dressing and hygiene tasks (max A). Anticipate patient will be able to progress during acute stay. Patient would continue to benefit from skilled OT services at this time while admitted to address noted deficits in order to improve overall safety and independence in ADLs.       Recommendations for follow up therapy are one component of a multi-disciplinary discharge planning process, led by the attending physician.  Recommendations may be updated based on patient status, additional functional criteria and insurance authorization.   Follow Up Recommendations  No OT follow up    Assistance Recommended at Discharge Intermittent Supervision/Assistance  Patient can return home with the following A little help with bathing/dressing/bathroom;Assistance with cooking/housework;Direct supervision/assist for financial management;Assist for transportation;Help with stairs or ramp for entrance;Direct supervision/assist for medications management    Functional Status Assessment  Patient has had a recent decline in their functional status and demonstrates the ability to make significant improvements in  function in a reasonable and predictable amount of time.  Equipment Recommendations  Other (comment) (TBD)    Recommendations for Other Services       Precautions / Restrictions Precautions Precaution Comments: abdominal surgery Restrictions Weight Bearing Restrictions: No      Mobility Bed Mobility Overal bed mobility: Needs Assistance Bed Mobility: Sit to Supine       Sit to supine: Min assist   General bed mobility comments: with education on how to log roll to avoid pain in back and abdomen.    Transfers                          Balance Overall balance assessment: Mild deficits observed, not formally tested                                         ADL either performed or assessed with clinical judgement   ADL Overall ADL's : Needs assistance/impaired Eating/Feeding: Set up;Sitting Eating/Feeding Details (indicate cue type and reason): only just cleared for full liquids as of 5/24 per patient report/ Grooming: Wash/dry face;Sitting;Set up   Upper Body Bathing: Minimal assistance;Sitting   Lower Body Bathing: Moderate assistance;Sitting/lateral leans   Upper Body Dressing : Minimal assistance;Sitting   Lower Body Dressing: Maximal assistance;Sitting/lateral leans   Toilet Transfer: Minimal assistance;Rolling walker (2 wheels);Ambulation Toilet Transfer Details (indicate cue type and reason): with use of grab bar and increased time. Toileting- Clothing Manipulation and Hygiene: Maximal assistance;Sit to/from stand Toileting - Clothing Manipulation Details (indicate cue type and reason): patient was unable to reach posteriorly and complete hygiene tasks. is typically able to compelte at home     Functional mobility during  ADLs: Min guard;Rolling walker (2 wheels)       Vision Patient Visual Report: No change from baseline       Perception     Praxis      Pertinent Vitals/Pain Pain Assessment Pain Assessment: 0-10 Pain  Score: 8  Pain Location: abdominal area Pain Descriptors / Indicators: Discomfort, Operative site guarding, Grimacing Pain Intervention(s): Monitored during session, Repositioned, Patient requesting pain meds-RN notified, RN gave pain meds during session     Hand Dominance     Extremity/Trunk Assessment Upper Extremity Assessment Upper Extremity Assessment: Overall WFL for tasks assessed   Lower Extremity Assessment Lower Extremity Assessment: Defer to PT evaluation   Cervical / Trunk Assessment Cervical / Trunk Assessment: Normal   Communication Communication Communication: No difficulties   Cognition Arousal/Alertness: Awake/alert Behavior During Therapy: WFL for tasks assessed/performed Overall Cognitive Status: Within Functional Limits for tasks assessed                                       General Comments       Exercises     Shoulder Instructions      Home Living Family/patient expects to be discharged to:: Private residence Living Arrangements: Spouse/significant other Available Help at Discharge: Family;Available 24 hours/day Type of Home: House Home Access: Stairs to enter [employer] of Steps: 3         Bathroom Shower/Tub: Tub/shower unit         Home Equipment: Conservation officer, nature (2 wheels);Rollator (4 wheels)          Prior Functioning/Environment Prior Level of Function : Independent/Modified Independent                        OT Problem List: Decreased activity tolerance;Impaired balance (sitting and/or standing);Decreased safety awareness;Decreased knowledge of precautions;Decreased knowledge of use of DME or AE      OT Treatment/Interventions: Self-care/ADL training;Therapeutic exercise;Neuromuscular education;Energy conservation;DME and/or AE instruction;Therapeutic activities;Balance training;Patient/family education    OT Goals(Current goals can be found in the care plan section) Acute Rehab OT  Goals Patient Stated Goal: to get pain and bloating under control OT Goal Formulation: With patient Time For Goal Achievement: 03/29/22 Potential to Achieve Goals: Good  OT Frequency: Min 2X/week    Co-evaluation              AM-PAC OT "6 Clicks" Daily Activity     Outcome Measure Help from another person eating meals?: A Little Help from another person taking care of personal grooming?: A Little Help from another person toileting, which includes using toliet, bedpan, or urinal?: A Lot Help from another person bathing (including washing, rinsing, drying)?: A Lot Help from another person to put on and taking off regular upper body clothing?: A Little Help from another person to put on and taking off regular lower body clothing?: A Lot 6 Click Score: 15   End of Session Equipment Utilized During Treatment: Rolling walker (2 wheels) Nurse Communication: Patient requests pain meds  Activity Tolerance: Patient tolerated treatment well Patient left: in bed;with call bell/phone within reach;with nursing/sitter in room  OT Visit Diagnosis: Unsteadiness on feet (R26.81);Other abnormalities of gait and mobility (R26.89);Pain                Time: 4627-0350 OT Time Calculation (min): 29 min Charges:  OT General Charges $OT Visit: 1 Visit OT Evaluation $OT Eval  Moderate Complexity: 1 Mod  Jackelyn Poling OTR/L, [place] Acute Rehabilitation Department Office# [phone] Pager# [phone]   Marcellina Millin 03/15/2022, 1:06 PM

## 2022-03-15 NOTE — Plan of Care (Signed)
  Problem: Clinical Measurements: Goal: Diagnostic test results will improve Outcome: Progressing   Problem: Clinical Measurements: Goal: Respiratory complications will improve Outcome: Progressing   Problem: Clinical Measurements: Goal: Cardiovascular complication will be avoided Outcome: Progressing   Problem: Safety: Goal: Ability to remain free from injury will improve Outcome: Progressing   Problem: Skin Integrity: Goal: Risk for impaired skin integrity will decrease Outcome: Progressing

## 2022-03-15 NOTE — Progress Notes (Signed)
3 Days Post-Op   Subjective/Chief Complaint: Resting comfortably Feels better since NG removed Mild nausea - no vomiting Ostomy - 75 cc of brownish thin output   Objective: Vital signs in last 24 hours: Temp:  [97.4 F (36.3 C)-98.9 F (37.2 C)] 97.4 F (36.3 C) (05/28 0519) Pulse Rate:  [77-82] 77 (05/28 0519) Resp:  [14-20] 20 (05/28 0519) BP: (121-133)/(65-76) 123/76 (05/28 0519) SpO2:  [97 %-100 %] 97 % (05/28 0519) Last BM Date : 03/13/22  Intake/Output from previous day: 05/27 0701 - 05/28 0700 In: 1929.5 [P.O.:120; I.V.:459.1; IV Piggyback:1350.5] Out: 1075 [Urine:1000; Stool:75] Intake/Output this shift: Total I/O In: 201.6 [I.V.:146.3; IV Piggyback:55.3] Out: -   Gen:  Alert, NAD; appears comfortable Pulm: rate and effort normal on room air Abd: soft, mild distension, appropriately tender, lap incisions dressings removed - c/d/i, ostomy viable with liquid brown stool in pouch  Lab Results:  Recent Labs    03/14/22 0404 03/15/22 0335  WBC 2.1* 2.6*  HGB 9.4* 10.2*  HCT 28.1* 30.2*  PLT 185 262   BMET Recent Labs    03/14/22 0404 03/15/22 0335  NA 135 131*  K 3.3* 3.7  CL 102 98  CO2 25 26  GLUCOSE 90 141*  BUN 8 <5*  CREATININE 0.49 0.48  CALCIUM 7.9* 7.8*   PT/INR No results for input(s): LABPROT, INR in the last 72 hours. ABG No results for input(s): PHART, HCO3 in the last 72 hours.  Invalid input(s): PCO2, PO2  Studies/Results: No results found.  Anti-infectives: Anti-infectives (From admission, onward)    Start     Dose/Rate Route Frequency Ordered Stop   03/12/22 0600  cefoTEtan (CEFOTAN) 2 g in sodium chloride 0.9 % 100 mL IVPB        2 g 200 mL/hr over 30 Minutes Intravenous On call to O.R. 03/11/22 1508 03/12/22 1032   03/11/22 2000  neomycin (MYCIFRADIN) tablet 1,000 mg       See Hyperspace for full Linked Orders Report.   1,000 mg Oral 3 times per day 03/11/22 1508 03/12/22 1459   03/11/22 2000  metroNIDAZOLE (FLAGYL)  tablet 1,000 mg       See Hyperspace for full Linked Orders Report.   1,000 mg Oral 3 times per day 03/11/22 1508 03/12/22 1459       Assessment/Plan: -POD#3 s/p LAPAROSCOPIC ASSISTED DIVERTING COLOSTOMY 5/25 Dr. Johney Maine - clear liquids - WOC consult for new ostomy.  - schedule IV tylenol and IV robaxin for better pain control - mobilize, will ask PT to see   FEN: IVF, NPO/NGT to LIWS VTE: SCDs, ok for chemical DVT prophylaxis from surgical standpoint ID: none currently Foley: removed 5/26    Anemia Neutropenia L1 vertebral compression fx   LOS: 4 days    Karen Kaufman 03/15/2022

## 2022-03-16 ENCOUNTER — Other Ambulatory Visit: Payer: Self-pay | Admitting: Oncology

## 2022-03-16 DIAGNOSIS — K56609 Unspecified intestinal obstruction, unspecified as to partial versus complete obstruction: Secondary | ICD-10-CM | POA: Diagnosis not present

## 2022-03-16 DIAGNOSIS — T451X5A Adverse effect of antineoplastic and immunosuppressive drugs, initial encounter: Secondary | ICD-10-CM | POA: Diagnosis not present

## 2022-03-16 DIAGNOSIS — S32010A Wedge compression fracture of first lumbar vertebra, initial encounter for closed fracture: Secondary | ICD-10-CM | POA: Diagnosis not present

## 2022-03-16 DIAGNOSIS — D701 Agranulocytosis secondary to cancer chemotherapy: Secondary | ICD-10-CM | POA: Diagnosis not present

## 2022-03-16 LAB — CBC WITH DIFFERENTIAL/PLATELET
Abs Immature Granulocytes: 0.06 10*3/uL (ref 0.00–0.07)
Basophils Absolute: 0 10*3/uL (ref 0.0–0.1)
Basophils Relative: 0 %
Eosinophils Absolute: 0.1 10*3/uL (ref 0.0–0.5)
Eosinophils Relative: 5 %
HCT: 30.7 % — ABNORMAL LOW (ref 36.0–46.0)
Hemoglobin: 10.2 g/dL — ABNORMAL LOW (ref 12.0–15.0)
Immature Granulocytes: 2 %
Lymphocytes Relative: 31 %
Lymphs Abs: 0.9 10*3/uL (ref 0.7–4.0)
MCH: 30.2 pg (ref 26.0–34.0)
MCHC: 33.2 g/dL (ref 30.0–36.0)
MCV: 90.8 fL (ref 80.0–100.0)
Monocytes Absolute: 0.9 10*3/uL (ref 0.1–1.0)
Monocytes Relative: 30 %
Neutro Abs: 1 10*3/uL — ABNORMAL LOW (ref 1.7–7.7)
Neutrophils Relative %: 32 %
Platelets: 328 10*3/uL (ref 150–400)
RBC: 3.38 MIL/uL — ABNORMAL LOW (ref 3.87–5.11)
RDW: 12.2 % (ref 11.5–15.5)
WBC: 3 10*3/uL — ABNORMAL LOW (ref 4.0–10.5)
nRBC: 0 % (ref 0.0–0.2)

## 2022-03-16 LAB — BASIC METABOLIC PANEL
Anion gap: 5 (ref 5–15)
BUN: <5 mg/dL — ABNORMAL LOW (ref 8–23)
CO2: 27 mmol/L (ref 22–32)
Calcium: 8.2 mg/dL — ABNORMAL LOW (ref 8.9–10.3)
Chloride: 104 mmol/L (ref 98–111)
Creatinine, Ser: 0.43 mg/dL — ABNORMAL LOW (ref 0.44–1.00)
GFR, Estimated: >60 mL/min (ref >60)
Glucose, Bld: 125 mg/dL — ABNORMAL HIGH (ref 70–99)
Potassium: 3.8 mmol/L (ref 3.5–5.1)
Sodium: 136 mmol/L (ref 135–145)

## 2022-03-16 MED ORDER — METOCLOPRAMIDE HCL 5 MG/ML IJ SOLN
10.0000 mg | Freq: Four times a day (QID) | INTRAMUSCULAR | Status: DC
  Administered 2022-03-16 – 2022-03-20 (×16): 10 mg via INTRAVENOUS
  Filled 2022-03-16 (×16): qty 2

## 2022-03-16 NOTE — Consult Note (Addendum)
[facility] Nurse ostomy consult note Pouch change and teaching session performed with patient and husband at the bedside. Pt's abd is very tight and swollen and tender to touch when the pouch was removed.  Stoma type/location: Stoma is dusky purple and edematous, 2 inches, above skin level.  Peristomal assessment: intact skin surrounding Output: pt states she has small amt flatus, no stool.  50cc pink liquid in the pouch Ostomy pouching: 1pc.  Education provided:  Demonstrated pouch  change using barrier ring Kellie Simmering # [id number] and one piece pouch Lawson # [id number].  Pt was able to assist with barrier ring and pouch application using a hand held mirror and could open and close velcro to empty independently. Discussed pouching routines and ordering supplies.  Educational materials left in room, along with 5 sets of barrier rings and pouches. Pt and husband asked appropriate questions.  Enrolled patient in [employer] program: Yes, previously. Julien Girt MSN, RN, [place], [place], [place]

## 2022-03-16 NOTE — Progress Notes (Signed)
Physical Therapy Treatment Patient Details Name: Karen Kaufman MRN: [id number] DOB: [birth year] Today's Date: 03/16/2022   History of Present Illness Patient has a history of recently diagnosed metastatic colorectal cancer.  Patient had a CT scan on April 23 that showed a circumferential wall thickening around her sigmoid colon and rectum concerning for rectal cancer.  She ended up having MRI that showed evidence of liver metastases.S/P LAPAROSCOPIC ASSISTED DIVERTING COLOSTOMYon 03/12/22,    PT Comments    Pt agreeable with encouragement. Amb hallway distance, having some L abd pain especially with bed mobility. Continue PT, encouraged attempts to [facility]   Recommendations for follow up therapy are one component of a multi-disciplinary discharge planning process, led by the attending physician.  Recommendations may be updated based on patient status, additional functional criteria and insurance authorization.  Follow Up Recommendations  No PT follow up     Assistance Recommended at Discharge Intermittent Supervision/Assistance  Patient can return home with the following A little help with walking and/or transfers;Assist for transportation;Help with stairs or ramp for entrance;A little help with bathing/dressing/bathroom   Equipment Recommendations  None recommended by PT    Recommendations for Other Services       Precautions / Restrictions Precautions Precaution Comments: abdominal surgery Restrictions Weight Bearing Restrictions: No     Mobility  Bed Mobility Overal bed mobility: Needs Assistance Bed Mobility: Supine to Sit, Sit to Supine     Supine to sit: Min guard, Min assist Sit to supine: Min guard, Supervision   General bed mobility comments: with education on how to log roll to avoid pain in and abdomen; light assist to come to sit d/t pain. pt was ble to lift LEs on to bed with min/guard    Transfers Overall transfer level: Needs assistance Equipment used:  Rolling walker (2 wheels) Transfers: Sit to/from Stand Sit to Stand: Min guard           General transfer comment: cues for hand placement \    Ambulation/Gait Ambulation/Gait assistance: Min guard, Supervision Gait Distance (Feet): 140 Feet Assistive device: Rolling walker (2 wheels) Gait Pattern/deviations: Step-through pattern, Decreased stride length       General Gait Details: min cues for posture and position from [employer]             Wheelchair Mobility    Modified Rankin (Stroke Patients Only)       Balance                                            Cognition Arousal/Alertness: Awake/alert Behavior During Therapy: WFL for tasks assessed/performed Overall Cognitive Status: Within Functional Limits for tasks assessed                                          Exercises      General Comments        Pertinent Vitals/Pain Pain Assessment Pain Assessment: 0-10 Pain Score: 5  Pain Location: abdominal area Pain Descriptors / Indicators: Discomfort, Operative site guarding, Grimacing Pain Intervention(s): Limited activity within patient's tolerance, Monitored during session, Repositioned, Premedicated before session    Home Living  Prior Function            PT Goals (current goals can now be found in the care plan section) Acute Rehab PT Goals Patient Stated Goal: Regain PT Goal Formulation: With patient Time For Goal Achievement: 03/15/22 Potential to Achieve Goals: Good Progress towards PT goals: Progressing toward goals    Frequency    Min 3X/week      PT Plan Current plan remains appropriate    Co-evaluation              AM-PAC PT "6 Clicks" Mobility   Outcome Measure  Help needed turning from your back to your side while in a flat bed without using bedrails?: A Little Help needed moving from lying on your back to sitting on the side of a flat  bed without using bedrails?: A Little Help needed moving to and from a bed to a chair (including a wheelchair)?: A Little Help needed standing up from a chair using your arms (e.g., wheelchair or bedside chair)?: A Little Help needed to walk in hospital room?: A Little Help needed climbing 3-5 steps with a railing? : A Little 6 Click Score: 18    End of Session Equipment Utilized During Treatment: Gait belt Activity Tolerance: Patient tolerated treatment well Patient left: in bed;with call bell/phone within reach;with bed alarm set;with family/visitor present   PT Visit Diagnosis: Unsteadiness on feet (R26.81)     Time: 6270-3500 PT Time Calculation (min) (ACUTE ONLY): 10 min  Charges:  $Gait Training: 8-22 mins                     Baxter Flattery, PT  Acute Rehab Dept ([place]) [phone] Pager [phone]  03/16/2022    [facility] 03/16/2022, 1:19 PM

## 2022-03-16 NOTE — TOC Progression Note (Signed)
Transition of Care [facility]) - Progression Note   Patient Details  Name: Karen Kaufman MRN: [id number] Date of Birth: [birth year]  Transition of Care [facility]) CM/SW [place], LCSW Phone Number: 03/16/2022, 9:22 AM  Clinical Narrative: PT and OT evaluations did not recommend HH or any DME at this time.    Readmission Risk Interventions    03/16/2022    9:22 AM  Readmission Risk Prevention Plan  HRI or Home Care Consult Complete  Social Work Consult for [place] Planning/Counseling Complete  Palliative Care Screening Not Applicable

## 2022-03-16 NOTE — Progress Notes (Signed)
[facility] PROGRESS NOTE   Karen Kaufman KJZ:[id number] DOB: 1961/03/19 DOA: 03/11/2022  PCP: Gwenlyn Perking, FNP  Brief History/Interval Summary: 61 y.o. female with medical history significant for colorectal cancer, HLD.  Presented with abdominal pain and distention.  Was found to have small bowel obstruction from her colonic mass.  Hospitalized for further management.    Consultants: Medical oncology.  General surgery  Procedures: Laparoscopic assisted diverting colostomy on 5/25    Subjective/Interval History: Overall she feels better but still has abdominal pain at times.  Reasonably well controlled with pain medications.  No nausea or vomiting currently.  Denies any chest pain or shortness of breath.   Assessment/Plan:  Bowel obstruction secondary to colorectal cancer Patient with known history of colorectal cancer.  Seen by general surgery.   Underwent laparoscopic assisted diverting colostomy on 5/25.  NG tube was removed a few days ago.  Defer management to general surgery.  Looks like they are adding metoclopramide to help with bowel function.  Colorectal cancer with metastases Followed by medical oncology.  Diagnosed in April 2023.  She is status post 1 cycle of FOLFOX.  Medical oncology is following.  Chest x-ray showed that Port-A-Cath was in good position.  Neutropenia Probably secondary to chemotherapeutic infusion that she received on May 15.   If she starts spiking fever she will need to be placed on broad-spectrum antibiotics along with further evaluation including blood cultures.  Will defer marrow stimulating agents to medical oncology.   Total count has improved to 3000.  Neutrophil count has improved to 1000.  Remains afebrile.  Normocytic anemia Drop in hemoglobin also most likely due to combination of chemotherapy as well as surgery.  No overt blood loss noted otherwise. Anemia panel reviewed.  No clear-cut deficiencies identified. Hemoglobin is  stable.  L1 vertebral compression fracture Does not appear to be particularly symptomatic from this.  Seen by PT and OT.  Ambulated well.  No significant back pain.  Does not appear to require any intervention at this time.  Hypokalemia Improved.  Hypophosphatemia Improved with supplementation.  Hyponatremia Stable.  Continue to monitor resolved.  DVT Prophylaxis: Lovenox Code Status: Full code Family Communication: Discussed with patient Disposition Plan: Home when improved.  Status is: Inpatient Remains inpatient appropriate because: Small bowel obstruction      Medications: Scheduled:  Chlorhexidine Gluconate Cloth  6 each Topical Daily   enoxaparin (LOVENOX) injection  40 mg Subcutaneous Q24H   lip balm  1 application. Topical BID   mouth rinse  15 mL Mouth Rinse BID   metoCLOPramide (REGLAN) injection  10 mg Intravenous Q6H   Continuous:  dextrose 5 % and 0.45 % NaCl with KCl 20 mEq/L 75 mL/hr at 03/16/22 0152   methocarbamol (ROBAXIN) IV 1,000 mg (03/16/22 0551)   ondansetron (ZOFRAN) IV     XYI:AXKP & mag hydroxide-simeth, diphenhydrAMINE, lip balm, magic mouthwash, menthol-cetylpyridinium, morphine injection, ondansetron (ZOFRAN) IV **OR** ondansetron (ZOFRAN) IV, phenol, prochlorperazine, simethicone, sodium chloride flush  Antibiotics: Anti-infectives (From admission, onward)    Start     Dose/Rate Route Frequency Ordered Stop   03/12/22 0600  cefoTEtan (CEFOTAN) 2 g in sodium chloride 0.9 % 100 mL IVPB        2 g 200 mL/hr over 30 Minutes Intravenous On call to O.R. 03/11/22 1508 03/12/22 1032   03/11/22 2000  neomycin (MYCIFRADIN) tablet 1,000 mg       See Hyperspace for full Linked Orders Report.   1,000 mg Oral 3  times per day 03/11/22 1508 03/12/22 1459   03/11/22 2000  metroNIDAZOLE (FLAGYL) tablet 1,000 mg       See Hyperspace for full Linked Orders Report.   1,000 mg Oral 3 times per day 03/11/22 1508 03/12/22 1459       Objective:  Vital  Signs  Vitals:   03/15/22 1325 03/15/22 1500 03/15/22 2130 03/16/22 0549  BP: 112/66  118/71 114/67  Pulse: 81  79 80  Resp: '16  16 17  '$ Temp: 98.5 F (36.9 C)  (!) 97.5 F (36.4 C) 97.6 F (36.4 C)  TempSrc: Oral  Oral Oral  SpO2: 98%  96% 96%  Height:  '5\' 6"'$  (1.676 m)      Intake/Output Summary (Last 24 hours) at 03/16/2022 0854 Last data filed at 03/16/2022 0551 Gross per 24 hour  Intake 1997.92 ml  Output 550 ml  Net 1447.92 ml    There were no vitals filed for this visit.  General appearance: Awake alert.  In no distress Resp: Clear to auscultation bilaterally.  Normal effort Cardio: S1-S2 is normal regular.  No S3-S4.  No rubs murmurs or bruit GI: Abdomen is soft.  Ostomy is noted.  Mildly distended.  Bowel sounds sluggish.  Tender to palpate. Extremities: No edema.  Full range of motion of lower extremities. Neurologic: Alert and oriented x3.  No focal neurological deficits.      Lab Results:  Data Reviewed: I have personally reviewed following labs and reports of the imaging studies  CBC: Recent Labs  Lab 03/12/22 0921 03/13/22 0549 03/14/22 0404 03/15/22 0335 03/16/22 0320  WBC 1.0* 1.4* 2.1* 2.6* 3.0*  NEUTROABS 0.6* 0.6* 0.7* 1.1* 1.0*  HGB 12.9 10.0* 9.4* 10.2* 10.2*  HCT 37.1 28.2* 28.1* 30.2* 30.7*  MCV 87.1 87.9 89.2 90.1 90.8  PLT 220 180 185 262 328     Basic Metabolic Panel: Recent Labs  Lab 03/11/22 1057 03/12/22 0921 03/13/22 0549 03/14/22 0404 03/15/22 0335 03/16/22 0320  NA 133* 133* 135 135 131* 136  K 3.2* 3.5 3.6 3.3* 3.7 3.8  CL 94* 101 103 102 98 104  CO2 '27 23 24 25 26 27  '$ GLUCOSE 102* 110* 103* 90 141* 125*  BUN 9 12 7* 8 <5* <5*  CREATININE 0.63 0.59 0.61 0.49 0.48 0.43*  CALCIUM 8.9 8.3* 8.2* 7.9* 7.8* 8.2*  MG 2.3  --  2.0  --  2.0  --   PHOS  --   --  2.1* 1.8* 3.3  --      GFR: Estimated Creatinine Clearance: 68.3 mL/min (A) (by C-G formula based on SCr of 0.43 mg/dL (L)).  Liver Function Tests: Recent  Labs  Lab 03/11/22 1057 03/12/22 0921  AST 29 30  ALT 28 30  ALKPHOS 121 125  BILITOT 0.9 1.1  PROT 6.9 6.0*  ALBUMIN 3.7 3.1*     Recent Labs  Lab 03/11/22 1057  LIPASE 24      Radiology Studies: No results found.     LOS: 5 days   Alexandre Lightsey [employer] on [email or web address]  03/16/2022, 8:54 AM

## 2022-03-16 NOTE — Progress Notes (Signed)
Occupational Therapy Treatment Patient Details Name: Karen Kaufman MRN: [id number] DOB: 1961/04/10 Today's Date: 03/16/2022   History of present illness Patient has a history of recently diagnosed metastatic colorectal cancer.  Patient had a CT scan on April 23 that showed a circumferential wall thickening around her sigmoid colon and rectum concerning for rectal cancer.  She ended up having MRI that showed evidence of liver metastases.S/P LAPAROSCOPIC ASSISTED DIVERTING COLOSTOMYon 03/12/22,   OT comments  Patient was educated on AE for LB Dressing with patient able to don/doff simulated pants with reacher over feet to knees. Patient reported she would order reacher and toileting buddy. Patient was noted to have continued pain limiting hygiene tasks posteriorly. Patient was educated on toileting wands and toileting buddies. Patient's discharge plan remains appropriate at this time. OT will continue to follow acutely.     Recommendations for follow up therapy are one component of a multi-disciplinary discharge planning process, led by the attending physician.  Recommendations may be updated based on patient status, additional functional criteria and insurance authorization.    Follow Up Recommendations  No OT follow up    Assistance Recommended at Discharge Intermittent Supervision/Assistance  Patient can return home with the following  A little help with bathing/dressing/bathroom;Assistance with cooking/housework;Direct supervision/assist for financial management;Assist for transportation;Help with stairs or ramp for entrance;Direct supervision/assist for medications management   Equipment Recommendations       Recommendations for Other Services      Precautions / Restrictions Precautions Precautions: Fall Precaution Comments: abdominal surgery Restrictions Weight Bearing Restrictions: No       Mobility Bed Mobility Overal bed mobility: Needs Assistance Bed Mobility: Supine to Sit,  Sit to Supine     Supine to sit: Min guard Sit to supine: Min guard   General bed mobility comments: with eduation on log rolling    Transfers                         Balance                                           ADL either performed or assessed with clinical judgement   ADL Overall ADL's : Needs assistance/impaired                     Lower Body Dressing: Minimal assistance;Sit to/from stand;Sitting/lateral leans Lower Body Dressing Details (indicate cue type and reason): with simulation of pants with reacher eduation with MI with reacher. Toilet Transfer: Minimal assistance;Rolling walker (2 wheels);Ambulation Toilet Transfer Details (indicate cue type and reason): with use of grab bar and increased time. Toileting- Clothing Manipulation and Hygiene: Maximal assistance;Sit to/from stand Toileting - Clothing Manipulation Details (indicate cue type and reason): patient was unable to reach posteriorly and complete hygiene tasks. patient and husband were educated on toileting wands. patient verbalized and demonstrated understanding.     Functional mobility during ADLs: Min guard;Rolling walker (2 wheels) General ADL Comments: in hallway    Extremity/Trunk Assessment              Vision       Perception     Praxis      Cognition Arousal/Alertness: Awake/alert Behavior During Therapy: WFL for tasks assessed/performed Overall Cognitive Status: Within Functional Limits for tasks assessed  Exercises      Shoulder Instructions       General Comments      Pertinent Vitals/ Pain       Pain Assessment Pain Assessment: Faces Faces Pain Scale: Hurts even more Pain Location: abdominal area Pain Descriptors / Indicators: Discomfort, Operative site guarding, Grimacing Pain Intervention(s): Limited activity within patient's tolerance, Monitored during session, Patient  requesting pain meds-RN notified, Repositioned  Home Living                                          Prior Functioning/Environment              Frequency  Min 2X/week        Progress Toward Goals  OT Goals(current goals can now be found in the care plan section)  Progress towards OT goals: Progressing toward goals     Plan Discharge plan remains appropriate    Co-evaluation                 AM-PAC OT "6 Clicks" Daily Activity     Outcome Measure   Help from another person eating meals?: A Little Help from another person taking care of personal grooming?: A Little Help from another person toileting, which includes using toliet, bedpan, or urinal?: A Little Help from another person bathing (including washing, rinsing, drying)?: A Little Help from another person to put on and taking off regular upper body clothing?: A Little Help from another person to put on and taking off regular lower body clothing?: A Little 6 Click Score: 18    End of Session Equipment Utilized During Treatment: Rolling walker (2 wheels)  OT Visit Diagnosis: Unsteadiness on feet (R26.81);Other abnormalities of gait and mobility (R26.89);Pain   Activity Tolerance Patient tolerated treatment well   Patient Left in bed;with call bell/phone within reach;with nursing/sitter in room   Nurse Communication Patient requests pain meds        Time: 8638-1771 OT Time Calculation (min): 44 min  Charges: OT General Charges $OT Visit: 1 Visit OT Treatments $Self Care/Home Management : 38-52 mins  Jackelyn Poling OTR/L, MS Acute Rehabilitation Department Office# [phone] Pager# [phone]   Marcellina Millin 03/16/2022, 4:03 PM

## 2022-03-16 NOTE — Progress Notes (Signed)
4 Days Post-Op   Subjective/Chief Complaint: Mild nausea with clear liquids yesterday - no vomiting Ostomy output still serosanguinous   Objective: Vital signs in last 24 hours: Temp:  [97.5 F (36.4 C)-98.5 F (36.9 C)] 97.6 F (36.4 C) (05/29 0549) Pulse Rate:  [79-81] 80 (05/29 0549) Resp:  [16-17] 17 (05/29 0549) BP: (112-118)/(66-71) 114/67 (05/29 0549) SpO2:  [96 %-98 %] 96 % (05/29 0549) Last BM Date : 03/15/22 (bloody output from colostomy)  Intake/Output from previous day: 05/28 0701 - 05/29 0700 In: 2199.5 [P.O.:900; I.V.:944.1; IV Piggyback:355.4] Out: 550 [Urine:500; Stool:50] Intake/Output this shift: No intake/output data recorded.  Gen:  Alert, NAD; appears comfortable Pulm: rate and effort normal on room air Abd: soft, mild distension, appropriately tender, lap incisions dressings removed - c/d/i, ostomy viable with serosanguinous output in pouch  Lab Results:  Recent Labs    03/15/22 0335 03/16/22 0320  WBC 2.6* 3.0*  HGB 10.2* 10.2*  HCT 30.2* 30.7*  PLT 262 328   BMET Recent Labs    03/15/22 0335 03/16/22 0320  NA 131* 136  K 3.7 3.8  CL 98 104  CO2 26 27  GLUCOSE 141* 125*  BUN <5* <5*  CREATININE 0.48 0.43*  CALCIUM 7.8* 8.2*   PT/INR No results for input(s): LABPROT, INR in the last 72 hours. ABG No results for input(s): PHART, HCO3 in the last 72 hours.  Invalid input(s): PCO2, PO2  Studies/Results: No results found.  Anti-infectives: Anti-infectives (From admission, onward)    Start     Dose/Rate Route Frequency Ordered Stop   03/12/22 0600  cefoTEtan (CEFOTAN) 2 g in sodium chloride 0.9 % 100 mL IVPB        2 g 200 mL/hr over 30 Minutes Intravenous On call to O.R. 03/11/22 1508 03/12/22 1032   03/11/22 2000  neomycin (MYCIFRADIN) tablet 1,000 mg       See Hyperspace for full Linked Orders Report.   1,000 mg Oral 3 times per day 03/11/22 1508 03/12/22 1459   03/11/22 2000  metroNIDAZOLE (FLAGYL) tablet 1,000 mg        See Hyperspace for full Linked Orders Report.   1,000 mg Oral 3 times per day 03/11/22 1508 03/12/22 1459       Assessment/Plan: -POD#4 s/p LAPAROSCOPIC ASSISTED DIVERTING COLOSTOMY 5/25 Dr. Johney Maine - clear liquids - Add Reglan 10 mg IV to see if it improved bowel function - WOC consult for new ostomy.  - schedule IV tylenol and IV robaxin for better pain control - mobilize, will ask PT to see   FEN: IVF, clear liquids VTE: SCDs, ok for chemical DVT prophylaxis from surgical standpoint ID: none currently Foley: removed 5/26    Anemia Neutropenia L1 vertebral compression fx   LOS: 5 days    Karen Kaufman 03/16/2022

## 2022-03-17 ENCOUNTER — Inpatient Hospital Stay: Payer: BC Managed Care – PPO

## 2022-03-17 ENCOUNTER — Inpatient Hospital Stay: Payer: BC Managed Care – PPO | Admitting: Oncology

## 2022-03-17 DIAGNOSIS — K56609 Unspecified intestinal obstruction, unspecified as to partial versus complete obstruction: Secondary | ICD-10-CM | POA: Diagnosis not present

## 2022-03-17 DIAGNOSIS — D701 Agranulocytosis secondary to cancer chemotherapy: Secondary | ICD-10-CM | POA: Diagnosis not present

## 2022-03-17 DIAGNOSIS — T451X5A Adverse effect of antineoplastic and immunosuppressive drugs, initial encounter: Secondary | ICD-10-CM | POA: Diagnosis not present

## 2022-03-17 DIAGNOSIS — S32010A Wedge compression fracture of first lumbar vertebra, initial encounter for closed fracture: Secondary | ICD-10-CM | POA: Diagnosis not present

## 2022-03-17 LAB — CBC WITH DIFFERENTIAL/PLATELET
Abs Immature Granulocytes: 0.27 10*3/uL — ABNORMAL HIGH (ref 0.00–0.07)
Basophils Absolute: 0 10*3/uL (ref 0.0–0.1)
Basophils Relative: 1 %
Eosinophils Absolute: 0.1 10*3/uL (ref 0.0–0.5)
Eosinophils Relative: 2 %
HCT: 29.4 % — ABNORMAL LOW (ref 36.0–46.0)
Hemoglobin: 9.6 g/dL — ABNORMAL LOW (ref 12.0–15.0)
Immature Granulocytes: 7 %
Lymphocytes Relative: 27 %
Lymphs Abs: 1.1 10*3/uL (ref 0.7–4.0)
MCH: 29.9 pg (ref 26.0–34.0)
MCHC: 32.7 g/dL (ref 30.0–36.0)
MCV: 91.6 fL (ref 80.0–100.0)
Monocytes Absolute: 0.9 10*3/uL (ref 0.1–1.0)
Monocytes Relative: 24 %
Neutro Abs: 1.6 10*3/uL — ABNORMAL LOW (ref 1.7–7.7)
Neutrophils Relative %: 39 %
Platelets: 301 10*3/uL (ref 150–400)
RBC: 3.21 MIL/uL — ABNORMAL LOW (ref 3.87–5.11)
RDW: 12.2 % (ref 11.5–15.5)
WBC: 4 10*3/uL (ref 4.0–10.5)
nRBC: 0 % (ref 0.0–0.2)

## 2022-03-17 LAB — BASIC METABOLIC PANEL
Anion gap: 5 (ref 5–15)
BUN: <5 mg/dL — ABNORMAL LOW (ref 8–23)
CO2: 27 mmol/L (ref 22–32)
Calcium: 8.1 mg/dL — ABNORMAL LOW (ref 8.9–10.3)
Chloride: 103 mmol/L (ref 98–111)
Creatinine, Ser: 0.44 mg/dL (ref 0.44–1.00)
GFR, Estimated: >60 mL/min (ref >60)
Glucose, Bld: 134 mg/dL — ABNORMAL HIGH (ref 70–99)
Potassium: 3.8 mmol/L (ref 3.5–5.1)
Sodium: 135 mmol/L (ref 135–145)

## 2022-03-17 MED ORDER — MORPHINE SULFATE (PF) 2 MG/ML IV SOLN
2.0000 mg | INTRAVENOUS | Status: DC | PRN
  Administered 2022-03-17 – 2022-03-19 (×6): 2 mg via INTRAVENOUS
  Filled 2022-03-17 (×6): qty 1

## 2022-03-17 MED ORDER — OXYCODONE-ACETAMINOPHEN 5-325 MG PO TABS
1.0000 | ORAL_TABLET | ORAL | Status: DC | PRN
  Administered 2022-03-17: 2 via ORAL
  Administered 2022-03-17: 1 via ORAL
  Filled 2022-03-17 (×2): qty 2

## 2022-03-17 MED ORDER — ENSURE ENLIVE PO LIQD
237.0000 mL | Freq: Two times a day (BID) | ORAL | Status: DC
  Administered 2022-03-17: 237 mL via ORAL

## 2022-03-17 NOTE — Progress Notes (Signed)
5 Days Post-Op  Subjective: CC: Feeling better today.  Tolerating CLD without nausea or vomiting.  Feels less bloated/distended and that her abdomen is more soft. Passing flatus via stoma in room. Some liquid stool in ostomy bag. Some pain in left upper portion of her abdomen that is also improving. Mobilizing. Voiding.   Objective: Vital signs in last 24 hours: Temp:  [98.1 F (36.7 C)-98.6 F (37 C)] 98.1 F (36.7 C) (05/30 0544) Pulse Rate:  [83-85] 84 (05/30 0544) Resp:  [17-18] 18 (05/30 0544) BP: (100-115)/(66-69) 100/66 (05/30 0544) SpO2:  [96 %-98 %] 98 % (05/30 0544) Last BM Date : 03/16/22  Intake/Output from previous day: 05/29 0701 - 05/30 0700 In: 2682.3 [P.O.:420; I.V.:1762.4; IV Piggyback:499.9] Out: 400 [Urine:300; Stool:100] Intake/Output this shift: No intake/output data recorded.  PE: Gen:  Alert, NAD, pleasant Lungs: Normal rate and effort Abd: Mild distention but soft. Some tenderness of the LUQ near ostomy but no peritonitis. +BS. Ostomy above skin level, dusky but productive with air and liquid stool in ostomy appliance. Laparoscopic incisions, c/d/i Ext:  No LE edema  Psych: A&Ox3   Lab Results:  Recent Labs    03/16/22 0320 03/17/22 0553  WBC 3.0* 4.0  HGB 10.2* 9.6*  HCT 30.7* 29.4*  PLT 328 301   BMET Recent Labs    03/16/22 0320 03/17/22 0553  NA 136 135  K 3.8 3.8  CL 104 103  CO2 27 27  GLUCOSE 125* 134*  BUN <5* <5*  CREATININE 0.43* 0.44  CALCIUM 8.2* 8.1*   PT/INR No results for input(s): LABPROT, INR in the last 72 hours. CMP     Component Value Date/Time   NA 135 03/17/2022 0553   NA 143 01/26/2022 0830   K 3.8 03/17/2022 0553   CL 103 03/17/2022 0553   CO2 27 03/17/2022 0553   GLUCOSE 134 (H) 03/17/2022 0553   BUN <5 (L) 03/17/2022 0553   BUN 5 (L) 01/26/2022 0830   CREATININE 0.44 03/17/2022 0553   CALCIUM 8.1 (L) 03/17/2022 0553   PROT 6.0 (L) 03/12/2022 0921   PROT 6.5 01/26/2022 0830   ALBUMIN 3.1  (L) 03/12/2022 0921   ALBUMIN 4.5 01/26/2022 0830   AST 30 03/12/2022 0921   ALT 30 03/12/2022 0921   ALKPHOS 125 03/12/2022 0921   BILITOT 1.1 03/12/2022 0921   BILITOT 0.4 01/26/2022 0830   GFRNONAA >60 03/17/2022 0553   GFRAA 92 11/28/2020 0817   Lipase     Component Value Date/Time   LIPASE 24 03/11/2022 1057    Studies/Results: No results found.  Anti-infectives: Anti-infectives (From admission, onward)    Start     Dose/Rate Route Frequency Ordered Stop   03/12/22 0600  cefoTEtan (CEFOTAN) 2 g in sodium chloride 0.9 % 100 mL IVPB        2 g 200 mL/hr over 30 Minutes Intravenous On call to O.R. 03/11/22 1508 03/12/22 1032   03/11/22 2000  neomycin (MYCIFRADIN) tablet 1,000 mg       See Hyperspace for full Linked Orders Report.   1,000 mg Oral 3 times per day 03/11/22 1508 03/12/22 1459   03/11/22 2000  metroNIDAZOLE (FLAGYL) tablet 1,000 mg       See Hyperspace for full Linked Orders Report.   1,000 mg Oral 3 times per day 03/11/22 1508 03/12/22 1459        Assessment/Plan POD#5 s/p LAPAROSCOPIC ASSISTED DIVERTING COLOSTOMY 5/25 Dr. Johney Maine for Large bowel obstruction secondary to Metastatic  colorectal cancer with liver mets and LN mets, on chemo - Adv to FLD - Monitor ostomy. Dusky but productive.  - WOCN following for new ostomy.  Malecot was removed on 5/26 - Oncology has seen and plans to resume systemic chemotherapy as outpatient - Mobilize, PT, no f/u   FEN:FLD, IVF per [facility] VTE: SCDs, Lovenox ID: none currently Foley: None    LOS: 6 days    Karen Kaufman , [facility] Surgery 03/17/2022, 9:00 AM Please see Amion for pager number during day hours 7:00am-4:30pm

## 2022-03-17 NOTE — Progress Notes (Signed)
[facility] PROGRESS NOTE   Karen Kaufman WUX:[id number] DOB: [birth year] DOA: 03/11/2022  PCP: Gwenlyn Perking, FNP  Brief History/Interval Summary: 61 y.o. female with medical history significant for colorectal cancer, HLD.  Presented with abdominal pain and distention.  Was found to have small bowel obstruction from her colonic mass.  Hospitalized for further management.    Consultants: Medical oncology.  General surgery  Procedures: Laparoscopic assisted diverting colostomy on 5/25    Subjective/Interval History: Patient mentions that she is improving from a pain standpoint.  Still experiencing some cramping.  Did tolerate clear liquid diet yesterday without any nausea or vomiting.  More gas was noted in the ostomy bag with some stool yesterday.   Assessment/Plan:  Bowel obstruction secondary to colorectal cancer Patient with known history of colorectal cancer.  Seen by general surgery.   Underwent laparoscopic assisted diverting colostomy on 5/25.  NG tube was removed a few days ago.   Started on metoclopramide by general surgery to help with bowel function. She is tolerating a clear liquid diet. Further management per general surgery.    Colorectal cancer with metastases Followed by medical oncology.  Diagnosed in April 2023.  She is status post 1 cycle of FOLFOX on 03/02/22.  Medical oncology is following.  Chest x-ray showed that Port-A-Cath was in good position.  Neutropenia Probably secondary to chemotherapeutic infusion that she received on May 15.   If she starts spiking fever she will need to be placed on broad-spectrum antibiotics along with further evaluation including blood cultures.  Will defer marrow stimulating agents to medical oncology.   Neutrophil counts have improved to 1600.  Total leukocyte count is 4000. Remains afebrile.  Normocytic anemia Drop in hemoglobin also most likely due to combination of chemotherapy as well as surgery.  No overt blood  loss noted otherwise. Anemia panel reviewed.  No clear-cut deficiencies identified. Hemoglobin is stable.  L1 vertebral compression fracture Does not appear to be particularly symptomatic from this.  Seen by PT and OT.  Ambulated well.  No significant back pain.  Does not appear to require any intervention at this time.  Hypokalemia Improved.  Magnesium 2.02 days ago.  Will recheck tomorrow.  Hypophosphatemia Improved with supplementation.  Recheck level tomorrow.  Hyponatremia Stable.  Continue to monitor resolved.  DVT Prophylaxis: Lovenox Code Status: Full code Family Communication: Discussed with patient Disposition Plan: Home when improved.  Status is: Inpatient Remains inpatient appropriate because: Small bowel obstruction      Medications: Scheduled:  Chlorhexidine Gluconate Cloth  6 each Topical Daily   enoxaparin (LOVENOX) injection  40 mg Subcutaneous Q24H   lip balm  1 application. Topical BID   mouth rinse  15 mL Mouth Rinse BID   metoCLOPramide (REGLAN) injection  10 mg Intravenous Q6H   Continuous:  dextrose 5 % and 0.45 % NaCl with KCl 20 mEq/L 75 mL/hr at 03/17/22 0510   methocarbamol (ROBAXIN) IV 1,000 mg (03/17/22 0516)   ondansetron (ZOFRAN) IV     OZD:GUYQ & mag hydroxide-simeth, diphenhydrAMINE, lip balm, magic mouthwash, menthol-cetylpyridinium, morphine injection, ondansetron (ZOFRAN) IV **OR** ondansetron (ZOFRAN) IV, oxyCODONE-acetaminophen, phenol, prochlorperazine, simethicone, sodium chloride flush  Antibiotics: Anti-infectives (From admission, onward)    Start     Dose/Rate Route Frequency Ordered Stop   03/12/22 0600  cefoTEtan (CEFOTAN) 2 g in sodium chloride 0.9 % 100 mL IVPB        2 g 200 mL/hr over 30 Minutes Intravenous On call to O.R. 03/11/22 1508 03/12/22  1032   03/11/22 2000  neomycin (MYCIFRADIN) tablet 1,000 mg       See Hyperspace for full Linked Orders Report.   1,000 mg Oral 3 times per day 03/11/22 1508 03/12/22 1459    03/11/22 2000  metroNIDAZOLE (FLAGYL) tablet 1,000 mg       See Hyperspace for full Linked Orders Report.   1,000 mg Oral 3 times per day 03/11/22 1508 03/12/22 1459       Objective:  Vital Signs  Vitals:   03/16/22 0549 03/16/22 1450 03/16/22 2119 03/17/22 0544  BP: 114/67 108/69 115/69 100/66  Pulse: 80 83 85 84  Resp: '17 18 17 18  '$ Temp: 97.6 F (36.4 C) 98.6 F (37 C) 98.5 F (36.9 C) 98.1 F (36.7 C)  TempSrc: Oral Oral Oral Oral  SpO2: 96% 97% 96% 98%  Height:        Intake/Output Summary (Last 24 hours) at 03/17/2022 0841 Last data filed at 03/17/2022 0600 Gross per 24 hour  Intake 2682.26 ml  Output 400 ml  Net 2282.26 ml    There were no vitals filed for this visit.   General appearance: Awake alert.  In no distress Resp: Clear to auscultation bilaterally.  Normal effort Cardio: S1-S2 is normal regular.  No S3-S4.  No rubs murmurs or bruit GI: Abdomen is soft.  Tender in the epigastric area without any rebound rigidity or guarding.  Ostomy is noted.  Bowel sounds present. Extremities: No edema.  Full range of motion of lower extremities. Neurologic: Alert and oriented x3.  No focal neurological deficits.      Lab Results:  Data Reviewed: I have personally reviewed following labs and reports of the imaging studies  CBC: Recent Labs  Lab 03/13/22 0549 03/14/22 0404 03/15/22 0335 03/16/22 0320 03/17/22 0553  WBC 1.4* 2.1* 2.6* 3.0* 4.0  NEUTROABS 0.6* 0.7* 1.1* 1.0* 1.6*  HGB 10.0* 9.4* 10.2* 10.2* 9.6*  HCT 28.2* 28.1* 30.2* 30.7* 29.4*  MCV 87.9 89.2 90.1 90.8 91.6  PLT 180 185 262 328 301     Basic Metabolic Panel: Recent Labs  Lab 03/11/22 1057 03/12/22 0921 03/13/22 0549 03/14/22 0404 03/15/22 0335 03/16/22 0320 03/17/22 0553  NA 133*   < > 135 135 131* 136 135  K 3.2*   < > 3.6 3.3* 3.7 3.8 3.8  CL 94*   < > 103 102 98 104 103  CO2 27   < > '24 25 26 27 27  '$ GLUCOSE 102*   < > 103* 90 141* 125* 134*  BUN 9   < > 7* 8 <5* <5*  <5*  CREATININE 0.63   < > 0.61 0.49 0.48 0.43* 0.44  CALCIUM 8.9   < > 8.2* 7.9* 7.8* 8.2* 8.1*  MG 2.3  --  2.0  --  2.0  --   --   PHOS  --   --  2.1* 1.8* 3.3  --   --    < > = values in this interval not displayed.     GFR: CrCl cannot be calculated (Unknown ideal weight.).  Liver Function Tests: Recent Labs  Lab 03/11/22 1057 03/12/22 0921  AST 29 30  ALT 28 30  ALKPHOS 121 125  BILITOT 0.9 1.1  PROT 6.9 6.0*  ALBUMIN 3.7 3.1*     Recent Labs  Lab 03/11/22 1057  LIPASE 24     Radiology Studies: No results found.     LOS: 6 days   Bonnielee Haff  [facility] [employer] operational officer on [email or web address]  03/17/2022, 8:41 AM

## 2022-03-17 NOTE — Progress Notes (Signed)
Physical Therapy Treatment Patient Details Name: Karen Kaufman MRN: [id number] DOB: 02-21-1961 Today's Date: 03/17/2022   History of Present Illness Patient has a history of recently diagnosed metastatic colorectal cancer.  Patient had a CT scan on April 23 that showed a circumferential wall thickening around her sigmoid colon and rectum concerning for rectal cancer.  She ended up having MRI that showed evidence of liver metastases.S/P LAPAROSCOPIC ASSISTED DIVERTING COLOSTOMYon 03/12/22,    PT Comments    Pt progressing very well. Reports she has not had pain meds since early am, pt verbalizes she is going to switch to po meds per discussion with her MD.  Pt tol exercises and incr distance amb today.   Recommendations for follow up therapy are one component of a multi-disciplinary discharge planning process, led by the attending physician.  Recommendations may be updated based on patient status, additional functional criteria and insurance authorization.  Follow Up Recommendations  No PT follow up     Assistance Recommended at Discharge Intermittent Supervision/Assistance  Patient can return home with the following Assist for transportation;Help with stairs or ramp for entrance;A little help with bathing/dressing/bathroom;Assistance with cooking/housework   Equipment Recommendations  None recommended by PT    Recommendations for Other Services       Precautions / Restrictions Precautions Precaution Comments: abdominal surgery, colostomy Restrictions Weight Bearing Restrictions: No     Mobility  Bed Mobility Overal bed mobility: Needs Assistance Bed Mobility: Sit to Supine, Rolling, Sidelying to Sit Rolling: Modified independent (Device/Increase time) Sidelying to sit: Modified independent (Device/Increase time), HOB elevated   Sit to supine: Modified independent (Device/Increase time)   General bed mobility comments: pt able to perform modified roll to R side, use of rail, HOB  elevated, no physical assist. pt plans to sleep on multiple pillows to elevat HOB at home, has a wedge but prefers not to use it.    Transfers Overall transfer level: Needs assistance Equipment used: Rolling walker (2 wheels) Transfers: Sit to/from Stand Sit to Stand: Supervision           General transfer comment: cues for hand placement    Ambulation/Gait Ambulation/Gait assistance: Supervision Gait Distance (Feet): 360 Feet Assistive device: Rolling walker (2 wheels) Gait Pattern/deviations: Step-through pattern, Decreased stride length       General Gait Details: supervision for safety, lines. 3 brief standing rests d/t pain. verbal cues for trunk extension as tol. pt demonstrates carryover with RW safety   Stairs             Wheelchair Mobility    Modified Rankin (Stroke Patients Only)       Balance                                            Cognition Arousal/Alertness: Awake/alert Behavior During Therapy: WFL for tasks assessed/performed Overall Cognitive Status: Within Functional Limits for tasks assessed                                          Exercises General Exercises - Lower Extremity Ankle Circles/Pumps: AROM, Both, 10 reps Quad Sets: AROM, Both, 10 reps Heel Slides: AROM, Both, 10 reps Hip ABduction/ADduction: AROM, Both, 10 reps Straight Leg Raises: AROM, Strengthening, Both, 10 reps    General Comments  Pertinent Vitals/Pain Pain Assessment Pain Assessment: Faces Faces Pain Scale: Hurts little more Pain Location: abdominal area Pain Descriptors / Indicators: Discomfort, Operative site guarding, Grimacing Pain Intervention(s): Limited activity within patient's tolerance, Monitored during session, Repositioned, Patient requesting pain meds-RN notified    Home Living                          Prior Function            PT Goals (current goals can now be found in the care  plan section) Acute Rehab PT Goals Patient Stated Goal: Regain Independence PT Goal Formulation: With patient Time For Goal Achievement: 03/15/22 Potential to Achieve Goals: Good Progress towards PT goals: Progressing toward goals    Frequency    Min 3X/week      PT Plan Current plan remains appropriate    Co-evaluation              AM-PAC PT "6 Clicks" Mobility   Outcome Measure  Help needed turning from your back to your side while in a flat bed without using bedrails?: A Little Help needed moving from lying on your back to sitting on the side of a flat bed without using bedrails?: A Little Help needed moving to and from a bed to a chair (including a wheelchair)?: A Little Help needed standing up from a chair using your arms (e.g., wheelchair or bedside chair)?: A Little Help needed to walk in hospital room?: A Little Help needed climbing 3-5 steps with a railing? : A Little 6 Click Score: 18    End of Session Equipment Utilized During Treatment: Gait belt Activity Tolerance: Patient tolerated treatment well Patient left: in bed;with call bell/phone within reach;with family/visitor present Nurse Communication: Mobility status;Patient requests pain meds PT Visit Diagnosis: Unsteadiness on feet (R26.81)     Time: 0240-9735 PT Time Calculation (min) (ACUTE ONLY): 17 min  Charges:  $Therapeutic Exercise: 8-22 mins                     Baxter Flattery, PT  Acute Rehab Dept ([place]) [phone] Pager [phone]  03/17/2022    [facility] 03/17/2022, 11:08 AM

## 2022-03-18 ENCOUNTER — Inpatient Hospital Stay (HOSPITAL_COMMUNITY): Payer: BC Managed Care – PPO

## 2022-03-18 DIAGNOSIS — K56609 Unspecified intestinal obstruction, unspecified as to partial versus complete obstruction: Secondary | ICD-10-CM | POA: Diagnosis not present

## 2022-03-18 DIAGNOSIS — E876 Hypokalemia: Secondary | ICD-10-CM | POA: Diagnosis not present

## 2022-03-18 DIAGNOSIS — E871 Hypo-osmolality and hyponatremia: Secondary | ICD-10-CM | POA: Diagnosis not present

## 2022-03-18 DIAGNOSIS — S32010A Wedge compression fracture of first lumbar vertebra, initial encounter for closed fracture: Secondary | ICD-10-CM | POA: Diagnosis not present

## 2022-03-18 LAB — CBC WITH DIFFERENTIAL/PLATELET
Abs Immature Granulocytes: 0.4 10*3/uL — ABNORMAL HIGH (ref 0.00–0.07)
Basophils Absolute: 0 10*3/uL (ref 0.0–0.1)
Basophils Relative: 0 %
Eosinophils Absolute: 0.1 10*3/uL (ref 0.0–0.5)
Eosinophils Relative: 2 %
HCT: 31.5 % — ABNORMAL LOW (ref 36.0–46.0)
Hemoglobin: 10.5 g/dL — ABNORMAL LOW (ref 12.0–15.0)
Immature Granulocytes: 7 %
Lymphocytes Relative: 15 %
Lymphs Abs: 0.9 10*3/uL (ref 0.7–4.0)
MCH: 30.3 pg (ref 26.0–34.0)
MCHC: 33.3 g/dL (ref 30.0–36.0)
MCV: 90.8 fL (ref 80.0–100.0)
Monocytes Absolute: 1.1 10*3/uL — ABNORMAL HIGH (ref 0.1–1.0)
Monocytes Relative: 18 %
Neutro Abs: 3.6 10*3/uL (ref 1.7–7.7)
Neutrophils Relative %: 58 %
Platelets: 458 10*3/uL — ABNORMAL HIGH (ref 150–400)
RBC: 3.47 MIL/uL — ABNORMAL LOW (ref 3.87–5.11)
RDW: 12.2 % (ref 11.5–15.5)
WBC: 6.2 10*3/uL (ref 4.0–10.5)
nRBC: 0.3 % — ABNORMAL HIGH (ref 0.0–0.2)

## 2022-03-18 LAB — BASIC METABOLIC PANEL
Anion gap: 7 (ref 5–15)
BUN: <5 mg/dL — ABNORMAL LOW (ref 8–23)
CO2: 29 mmol/L (ref 22–32)
Calcium: 8.7 mg/dL — ABNORMAL LOW (ref 8.9–10.3)
Chloride: 100 mmol/L (ref 98–111)
Creatinine, Ser: 0.45 mg/dL (ref 0.44–1.00)
GFR, Estimated: >60 mL/min (ref >60)
Glucose, Bld: 121 mg/dL — ABNORMAL HIGH (ref 70–99)
Potassium: 3.9 mmol/L (ref 3.5–5.1)
Sodium: 136 mmol/L (ref 135–145)

## 2022-03-18 LAB — MAGNESIUM: Magnesium: 1.9 mg/dL (ref 1.7–2.4)

## 2022-03-18 LAB — PHOSPHORUS: Phosphorus: 3.6 mg/dL (ref 2.5–4.6)

## 2022-03-18 MED ORDER — PANTOPRAZOLE SODIUM 40 MG IV SOLR
40.0000 mg | Freq: Two times a day (BID) | INTRAVENOUS | Status: DC
  Administered 2022-03-18 – 2022-03-20 (×5): 40 mg via INTRAVENOUS
  Filled 2022-03-18 (×5): qty 10

## 2022-03-18 MED ORDER — HYDROCODONE-ACETAMINOPHEN 5-325 MG PO TABS: 1.0000 | ORAL_TABLET | ORAL | Status: DC | PRN

## 2022-03-18 MED ORDER — PROCHLORPERAZINE EDISYLATE 10 MG/2ML IJ SOLN: 10.0000 mg | INTRAMUSCULAR | Status: DC | PRN

## 2022-03-18 NOTE — Progress Notes (Signed)
Occupational Therapy Treatment Patient Details Name: Karen Kaufman MRN: [id number] DOB: 1961/03/22 Today's Date: 03/18/2022   History of present illness Patient has a history of recently diagnosed metastatic colorectal cancer.  Patient had a CT scan on April 23 that showed a circumferential wall thickening around her sigmoid colon and rectum concerning for rectal cancer.  She ended up having MRI that showed evidence of liver metastases.S/P LAPAROSCOPIC ASSISTED DIVERTING COLOSTOMYon 03/12/22,   OT comments  Patient was educated on ECT and importance of self monitoring during healing process. Patient verbalized and demonstrated understanding with SUP for transfers with RW on this date. Patient was educated on progress in therapy with patient verbalizing understanding. Patient was d/c from OT services on this date.    Recommendations for follow up therapy are one component of a multi-disciplinary discharge planning process, led by the attending physician.  Recommendations may be updated based on patient status, additional functional criteria and insurance authorization.    Follow Up Recommendations  No OT follow up    Assistance Recommended at Discharge Intermittent Supervision/Assistance  Patient can return home with the following  A little help with bathing/dressing/bathroom;Assistance with cooking/housework;Direct supervision/assist for financial management;Assist for transportation;Help with stairs or ramp for entrance;Direct supervision/assist for medications management   Equipment Recommendations  Other (comment) (reacher and toileting buddy)    Recommendations for Other Services      Precautions / Restrictions Precautions Precautions: Fall Precaution Comments: abdominal surgery, colostomy Restrictions Weight Bearing Restrictions: No       Mobility Bed Mobility               General bed mobility comments: patient was up in recliner and returned to the same    Transfers                          Balance                                           ADL either performed or assessed with clinical judgement   ADL Overall ADL's : Needs assistance/impaired                                       General ADL Comments: patient is able to complete transfers on this date with SUP with patietn reporting husband assisted her to bathroom earlier on this date with no issues. patient reported she does not have any questions about AE recommended and that she ordered them. patient is MI for functional mobility in room and hallway with RW with obstacle navitagion and threshold navigation. patient endorses not needing OT at this time.    Extremity/Trunk Assessment              Vision       Perception     Praxis      Cognition Arousal/Alertness: Awake/alert Behavior During Therapy: WFL for tasks assessed/performed Overall Cognitive Status: Within Functional Limits for tasks assessed                                          Exercises      Shoulder Instructions       General Comments  Pertinent Vitals/ Pain       Pain Assessment Pain Assessment: Faces Faces Pain Scale: Hurts little more Pain Location: abdominal area Pain Descriptors / Indicators: Discomfort, Operative site guarding, Grimacing Pain Intervention(s): Limited activity within patient's tolerance, Monitored during session, Repositioned  Home Living                                          Prior Functioning/Environment              Frequency           Progress Toward Goals  OT Goals(current goals can now be found in the care plan section)  Progress towards OT goals: Goals met/education completed, patient discharged from [facility] Discharge plan remains appropriate    Co-evaluation                 AM-PAC OT "6 Clicks" Daily Activity     Outcome Measure   Help from another person  eating meals?: None Help from another person taking care of personal grooming?: None Help from another person toileting, which includes using toliet, bedpan, or urinal?: A Little Help from another person bathing (including washing, rinsing, drying)?: A Little Help from another person to put on and taking off regular upper body clothing?: None Help from another person to put on and taking off regular lower body clothing?: A Little 6 Click Score: 21    End of Session Equipment Utilized During Treatment: Rolling walker (2 wheels)  OT Visit Diagnosis: Unsteadiness on feet (R26.81);Other abnormalities of gait and mobility (R26.89);Pain   Activity Tolerance Patient tolerated treatment well   Patient Left with call bell/phone within reach;in chair;with family/visitor present   Nurse Communication          Time: 1413-1430 OT Time Calculation (min): 17 min  Charges: OT General Charges $OT Visit: 1 Visit OT Treatments $Therapeutic Activity: 8-22 mins  Jackelyn Poling OTR/L, MS Acute Rehabilitation Department Office# [phone] Pager# [phone]   Marcellina Millin 03/18/2022, 2:36 PM

## 2022-03-18 NOTE — Progress Notes (Signed)
[facility] PROGRESS NOTE    Progress Note  Bertrice Leder  CHY:[id number] DOB: 1961-01-05 DOA: 03/11/2022 PCP: Gwenlyn Perking, FNP     Brief Narrative:   Lucill Mauck is an 61 y.o. female past medical history significant for colorectal cancer presented with abdominal pain and distention found to have have a small bowel obstruction from her colonic mass lab works scopic assisted diverting colostomy 03/12/2022.   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 NG tube was discontinued. Started on Reglan per surgery. With nausea and vomiting overnight. Placed n.p.o. surgery on board they are getting an x-ray.  Colorectal cancer with metastases: Follow-up with medical oncology diagnosed in April 2023. Status post 1 cycle of chemotherapy.  Neutropenia: Likely due to chemotherapy. White blood cell count today 6.2.  Normocytic anemia: Drop in hemoglobin likely multifactorial in the setting of chemotherapy and surgery. Hemoglobin is stable no identifiable cause of anemia.  L1 vertebral compression fracture: Currently asymptomatic ambulating continue to work with physical therapy.  Hypokalemia: Resolved try to keep potassium greater than 4 magnesium greater than 2  Hypophosphatemia: Proved with oral supplementation.  Hypovolemic hyponatremia: Now resolved.   DVT prophylaxis: lovenox Family Communication:husband Status is: Inpatient Remains inpatient appropriate because: Acute colonic obstruction    Code Status:     Code Status Orders  (From admission, onward)           Start     Ordered   03/11/22 1945  Full code  Continuous        03/11/22 1944           Code Status History     Date Active Date Inactive Code Status Order ID Comments User Context   02/08/2022 1836 02/10/2022 2013 Full Code [id number]  Marcelyn Bruins, MD ED         IV Access:   Peripheral  IV   Procedures and diagnostic studies:   DG Abd Portable 1V  Result Date: 03/18/2022 CLINICAL DATA:  New generalized abdominal pain with worsening nausea overnight. EXAM: PORTABLE ABDOMEN - 1 VIEW COMPARISON:  Radiograph Mar 12, 2022 and CT Mar 11, 2022 FINDINGS: Nasogastric tube has been removed. No gas over the rectal vault. Gaseously distended loops of large and small bowel without pathologic dilation. IMPRESSION: Nasogastric tube has been removed. Gaseously distended loops of large and small bowel without pathologic dilation but no gas visualized over the rectal vault, in combination with findings from prior CT this may reflect an early/partial bowel obstruction consider continued radiographic surveillance. Electronically Signed   By: Dahlia Bailiff M.D.   On: 03/18/2022 08:57     Medical Consultants:   None.   Subjective:    Dalani Chiou complaining of nausea and vomiting overnight  Objective:    Vitals:   03/17/22 0544 03/17/22 1244 03/17/22 2106 03/18/22 0622  BP: 100/66 107/62 117/66 114/65  Pulse: 84 87 84 85  Resp: '18 16 18 18  '$ Temp: 98.1 F (36.7 C)  98 F (36.7 C) 98.6 F (37 C)  TempSrc: Oral  Oral Oral  SpO2: 98% 100% 96% 99%  Height:       SpO2: 99 % O2 Flow Rate (L/min): 8 L/min   Intake/Output Summary (Last 24 hours) at 03/18/2022 1059 Last data filed at 03/18/2022 1000 Gross per 24 hour  Intake 3277.48 ml  Output 825 ml  Net 2452.48 ml   There were no vitals filed for this visit.  Exam: General  exam: In no acute distress. Respiratory system: Good air movement and clear to auscultation. Cardiovascular system: S1 & S2 heard, RRR. No JVD. Gastrointestinal system: Abdomen is nondistended, soft and nontender.  Extremities: No pedal edema. Skin: No rashes, lesions or ulcers Psychiatry: Judgement and insight appear normal. Mood & affect appropriate.    Data Reviewed:    Labs: Basic Metabolic Panel: Recent Labs  Lab 03/13/22 0549  03/14/22 0404 03/15/22 0335 03/16/22 0320 03/17/22 0553 03/18/22 0353  NA 135 135 131* 136 135 136  K 3.6 3.3* 3.7 3.8 3.8 3.9  CL 103 102 98 104 103 100  CO2 '24 25 26 27 27 29  '$ GLUCOSE 103* 90 141* 125* 134* 121*  BUN 7* 8 <5* <5* <5* <5*  CREATININE 0.61 0.49 0.48 0.43* 0.44 0.45  CALCIUM 8.2* 7.9* 7.8* 8.2* 8.1* 8.7*  MG 2.0  --  2.0  --   --  1.9  PHOS 2.1* 1.8* 3.3  --   --  3.6   GFR CrCl cannot be calculated (Unknown ideal weight.). Liver Function Tests: Recent Labs  Lab 03/12/22 0921  AST 30  ALT 30  ALKPHOS 125  BILITOT 1.1  PROT 6.0*  ALBUMIN 3.1*   No results for input(s): LIPASE, AMYLASE in the last 168 hours. No results for input(s): AMMONIA in the last 168 hours. Coagulation profile No results for input(s): INR, PROTIME in the last 168 hours. COVID-19 Labs  No results for input(s): DDIMER, FERRITIN, LDH, CRP in the last 72 hours.  No results found for: SARSCOV2NAA  CBC: Recent Labs  Lab 03/14/22 0404 03/15/22 0335 03/16/22 0320 03/17/22 0553 03/18/22 0353  WBC 2.1* 2.6* 3.0* 4.0 6.2  NEUTROABS 0.7* 1.1* 1.0* 1.6* 3.6  HGB 9.4* 10.2* 10.2* 9.6* 10.5*  HCT 28.1* 30.2* 30.7* 29.4* 31.5*  MCV 89.2 90.1 90.8 91.6 90.8  PLT 185 262 328 301 458*   Cardiac Enzymes: No results for input(s): CKTOTAL, CKMB, CKMBINDEX, TROPONINI in the last 168 hours. BNP (last 3 results) No results for input(s): PROBNP in the last 8760 hours. CBG: No results for input(s): GLUCAP in the last 168 hours. D-Dimer: No results for input(s): DDIMER in the last 72 hours. Hgb A1c: No results for input(s): HGBA1C in the last 72 hours. Lipid Profile: No results for input(s): CHOL, HDL, LDLCALC, TRIG, CHOLHDL, LDLDIRECT in the last 72 hours. Thyroid function studies: No results for input(s): TSH, T4TOTAL, T3FREE, THYROIDAB in the last 72 hours.  Invalid input(s): FREET3 Anemia work up: No results for input(s): VITAMINB12, FOLATE, FERRITIN, TIBC, IRON, RETICCTPCT in the  last 72 hours. Sepsis Labs: Recent Labs  Lab 03/15/22 0335 03/16/22 0320 03/17/22 0553 03/18/22 0353  WBC 2.6* 3.0* 4.0 6.2   Microbiology No results found for this or any previous visit (from the past 240 hour(s)).   Medications:    Chlorhexidine Gluconate Cloth  6 each Topical Daily   enoxaparin (LOVENOX) injection  40 mg Subcutaneous Q24H   feeding supplement  237 mL Oral BID BM   lip balm  1 application. Topical BID   mouth rinse  15 mL Mouth Rinse BID   metoCLOPramide (REGLAN) injection  10 mg Intravenous Q6H   Continuous Infusions:  dextrose 5 % and 0.45 % NaCl with KCl 20 mEq/L 75 mL/hr at 03/18/22 0431   methocarbamol (ROBAXIN) IV 1,000 mg (03/18/22 0531)   ondansetron (ZOFRAN) IV        LOS: 7 days   Charlynne Cousins  [facility]  03/18/2022,  10:59 AM

## 2022-03-18 NOTE — Progress Notes (Signed)
6 Days Post-Op  Subjective: CC: Patient reports she had a rough night.  She reports after having Percocet yesterday she became nauseous.  She was unable to take anything p.o. except for small sips of liquids after this and felt more distended with associated burping/belching.  Had difficulty sleeping and had 1 episode of emesis at 130.  Nausea slightly improved after Zofran but still present.  She reports prior to taking Percocet she had no nausea and was doing okay with p.o. intake. This was the first time she took percocet. She has stable central to LUQ abdominal pain compared to yesterday.  Still having ostomy output with 625cc/24 hours  Objective: Vital signs in last 24 hours: Temp:  [98 F (36.7 C)-98.6 F (37 C)] 98.6 F (37 C) (05/31 0622) Pulse Rate:  [84-87] 85 (05/31 0622) Resp:  [16-18] 18 (05/31 0622) BP: (107-117)/(62-66) 114/65 (05/31 0622) SpO2:  [96 %-100 %] 99 % (05/31 0622) Last BM Date : 03/17/22  Intake/Output from previous day: 05/30 0701 - 05/31 0700 In: 3625.8 [P.O.:1440; I.V.:1792.4; IV Piggyback:393.4] Out: 825 [Emesis/NG output:200; Stool:625] Intake/Output this shift: No intake/output data recorded.  PE: Gen:  Alert, NAD, pleasant Lungs: Normal rate and effort Abd: Appears more distended today but still soft. Some increased tenderness of the epigastrium, periumbilical abdomen and LUQ near ostomy compared to yesterday but no peritonitis. +BS. Ostomy above skin level, dusky but productive with air and liquid stool in ostomy appliance. Laparoscopic incisions, c/d/i Ext:  No LE edema  Psych: A&Ox3     Lab Results:  Recent Labs    03/17/22 0553 03/18/22 0353  WBC 4.0 6.2  HGB 9.6* 10.5*  HCT 29.4* 31.5*  PLT 301 458*   BMET Recent Labs    03/17/22 0553 03/18/22 0353  NA 135 136  K 3.8 3.9  CL 103 100  CO2 27 29  GLUCOSE 134* 121*  BUN <5* <5*  CREATININE 0.44 0.45  CALCIUM 8.1* 8.7*   PT/INR No results for input(s): LABPROT, INR in  the last 72 hours. CMP     Component Value Date/Time   NA 136 03/18/2022 0353   NA 143 01/26/2022 0830   K 3.9 03/18/2022 0353   CL 100 03/18/2022 0353   CO2 29 03/18/2022 0353   GLUCOSE 121 (H) 03/18/2022 0353   BUN <5 (L) 03/18/2022 0353   BUN 5 (L) 01/26/2022 0830   CREATININE 0.45 03/18/2022 0353   CALCIUM 8.7 (L) 03/18/2022 0353   PROT 6.0 (L) 03/12/2022 0921   PROT 6.5 01/26/2022 0830   ALBUMIN 3.1 (L) 03/12/2022 0921   ALBUMIN 4.5 01/26/2022 0830   AST 30 03/12/2022 0921   ALT 30 03/12/2022 0921   ALKPHOS 125 03/12/2022 0921   BILITOT 1.1 03/12/2022 0921   BILITOT 0.4 01/26/2022 0830   GFRNONAA >60 03/18/2022 0353   GFRAA 92 11/28/2020 0817   Lipase     Component Value Date/Time   LIPASE 24 03/11/2022 1057    Studies/Results: No results found.  Anti-infectives: Anti-infectives (From admission, onward)    Start     Dose/Rate Route Frequency Ordered Stop   03/12/22 0600  cefoTEtan (CEFOTAN) 2 g in sodium chloride 0.9 % 100 mL IVPB        2 g 200 mL/hr over 30 Minutes Intravenous On call to O.R. 03/11/22 1508 03/12/22 1032   03/11/22 2000  neomycin (MYCIFRADIN) tablet 1,000 mg       See Hyperspace for full Linked Orders Report.  1,000 mg Oral 3 times per day 03/11/22 1508 03/12/22 1459   03/11/22 2000  metroNIDAZOLE (FLAGYL) tablet 1,000 mg       See Hyperspace for full Linked Orders Report.   1,000 mg Oral 3 times per day 03/11/22 1508 03/12/22 1459        Assessment/Plan POD#6 s/p LAPAROSCOPIC ASSISTED DIVERTING COLOSTOMY 5/25 Dr. Johney Maine for Large bowel obstruction secondary to Metastatic colorectal cancer with liver mets and LN mets, on chemo - N/v overnight. Unclear if related to medications or developing ileus etc (although still having bowel function). Will make NPO and obtain abd xray. D/c Percocet. Switched to PO Norco (takes as outpt and has tolerated).  - Monitor ostomy. Dusky but productive.  - WOCN following for new ostomy.  Malecot was removed  on 5/26 - Oncology has seen and plans to resume systemic chemotherapy as outpatient - Mobilize, PT, no f/u   FEN: NPO until xray, IVF per [facility] VTE: SCDs, Lovenox ID: none currently. Afebrile. VSS. WBC wnl.  Foley: None    LOS: 7 days    Jillyn Ledger , [facility] 03/18/2022, 8:21 AM Please see Amion for pager number during day hours 7:00am-4:30pm

## 2022-03-18 NOTE — Progress Notes (Signed)
Nutrition Follow-up  INTERVENTION:   If diet unable to be advance beyond liquids within 24 hours, recommend initiation of TPN (pt unable to have diet maintained beyond full liquids since admission 5/24-7 days)  If diet advanced -While on clears, Boost Breeze po TID, each supplement provides 250 kcal and 9 grams of protein  -Continue Ensure Plus High Protein po BID, each supplement provides 350 kcal and 20 grams of protein.   -Needs weight for admission   NUTRITION DIAGNOSIS:   Increased nutrient needs related to cancer and cancer related treatments as evidenced by estimated needs.  Ongoing.  GOAL:   Patient will meet greater than or equal to 90% of their needs  Not meeting.  MONITOR:   Diet advancement, Labs, Weight trends, I & O's  ASSESSMENT:   62 y.o. female with medical history significant of colorectal cancer, HLD. Presenting with abdominal pain and bloating. She had a fairly recent diagnosis of colorectal cancer. She started chemo last week. Admitted for Colonic obstruction secondary to colorectal cancer and plans for surgery.  5/24: admitted 5/25: s/p lap diverting colostomy  Patient was NPO this morning for abdominal x-ray. Surgery suspecting ileus. Pt was having some nausea and vomiting and could not take in much PO d/t increased burping/belching. Pt was on a full liquid diet. Now on clears. Will add Boost Breeze while on clear liquids.   Still needs weight for this admission.  Medications: IV Reglan, D5 infusion, IV Zofran  Labs reviewed: Low iron (10)  Diet Order:   Diet Order             Diet clear liquid Room service appropriate? Yes; Fluid consistency: Thin  Diet effective now                   EDUCATION NEEDS:   Not appropriate for education at this time  Skin:  Skin Assessment: Skin Integrity Issues: Skin Integrity Issues:: Incisions Incisions: 5/5: right abdomen, right chest, right neck  5/25: abdomen  Last BM:  5/31  -colostomy  Height:   Ht Readings from Last 1 Encounters:  03/15/22 '5\' 6"'$  (1.676 m)    Weight:   Wt Readings from Last 1 Encounters:  03/02/22 65.3 kg    BMI:  Body mass index is 23.24 kg/m.  Estimated Nutritional Needs:   Kcal:  1800-2000  Protein:  85-100g  Fluid:  2L/day  Clayton Bibles, MS, RD, LDN Inpatient Clinical Dietitian Contact information available via Amion

## 2022-03-19 ENCOUNTER — Inpatient Hospital Stay: Payer: BC Managed Care – PPO

## 2022-03-19 DIAGNOSIS — E871 Hypo-osmolality and hyponatremia: Secondary | ICD-10-CM | POA: Diagnosis not present

## 2022-03-19 DIAGNOSIS — K56609 Unspecified intestinal obstruction, unspecified as to partial versus complete obstruction: Secondary | ICD-10-CM | POA: Diagnosis not present

## 2022-03-19 DIAGNOSIS — S32010A Wedge compression fracture of first lumbar vertebra, initial encounter for closed fracture: Secondary | ICD-10-CM | POA: Diagnosis not present

## 2022-03-19 DIAGNOSIS — E876 Hypokalemia: Secondary | ICD-10-CM | POA: Diagnosis not present

## 2022-03-19 LAB — CBC WITH DIFFERENTIAL/PLATELET
Abs Immature Granulocytes: 0.59 10*3/uL — ABNORMAL HIGH (ref 0.00–0.07)
Basophils Absolute: 0 10*3/uL (ref 0.0–0.1)
Basophils Relative: 0 %
Eosinophils Absolute: 0.1 10*3/uL (ref 0.0–0.5)
Eosinophils Relative: 1 %
HCT: 29.1 % — ABNORMAL LOW (ref 36.0–46.0)
Hemoglobin: 9.3 g/dL — ABNORMAL LOW (ref 12.0–15.0)
Immature Granulocytes: 9 %
Lymphocytes Relative: 20 %
Lymphs Abs: 1.3 10*3/uL (ref 0.7–4.0)
MCH: 29.5 pg (ref 26.0–34.0)
MCHC: 32 g/dL (ref 30.0–36.0)
MCV: 92.4 fL (ref 80.0–100.0)
Monocytes Absolute: 0.9 10*3/uL (ref 0.1–1.0)
Monocytes Relative: 14 %
Neutro Abs: 3.5 10*3/uL (ref 1.7–7.7)
Neutrophils Relative %: 56 %
Platelets: 345 10*3/uL (ref 150–400)
RBC: 3.15 MIL/uL — ABNORMAL LOW (ref 3.87–5.11)
RDW: 12.5 % (ref 11.5–15.5)
WBC: 6.3 10*3/uL (ref 4.0–10.5)
nRBC: 0 % (ref 0.0–0.2)

## 2022-03-19 NOTE — Consult Note (Addendum)
[place] Nurse ostomy follow-up consult note Pouch change and teaching session performed with patient and husband at the bedside.  Stoma type/location: Stoma remains dusky purple and edematous, surgical team aware, 2 inches, above skin level.  Peristomal assessment: intact skin surrounding Output: 50cc semiformed brown stool in the pouch Ostomy pouching: 1pc.  Education provided:  Pt was able to perform all steps independently without assistance, using a hand held mirror to visualize her stoma. Used barrier ring Kellie Simmering # [id number] and one piece pouch Lawson # [id number].  Pt applied barrier ring and pouch and could open and close velcro to empty.  Reviewed pouching routines and ordering supplies.  Educational materials are in the room, along with 5 sets of barrier rings and pouches. Pt and husband asked appropriate questions.  Enrolled patient in [place] program: Yes, previously. Julien Girt MSN, RN, [place], [place], [place]

## 2022-03-19 NOTE — Progress Notes (Signed)
[facility] PROGRESS NOTE    Progress Note  Karen Kaufman  IAX:[id number] DOB: May 15, 1961 DOA: 03/11/2022 PCP: Gwenlyn Perking, FNP     Brief Narrative:   Karen Kaufman is an 61 y.o. female past medical history significant for colorectal cancer presented with abdominal pain and distention found to have have a small bowel obstruction from her colonic mass lab works scopic assisted diverting colostomy 03/12/2022.   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 NG tube was discontinued. Abdominal x-ray showed gaseous distended loops and large and small bowel.  She is having regular bowel movements. Continue Reglan, NPO. Further management per surgery.  Colorectal cancer with metastases: Follow-up with medical oncology diagnosed in April 2023. Status post 1 cycle of chemotherapy.  Neutropenia: Likely due to chemotherapy. White blood cell count today 6.2.  Normocytic anemia: Drop in hemoglobin likely multifactorial in the setting of chemotherapy and surgery. Hemoglobin is stable.  L1 vertebral compression fracture: Currently asymptomatic ambulating continue to work with physical therapy.  Hypokalemia: Resolved try to keep potassium greater than 4 magnesium greater than 2  Hypophosphatemia: Proved with oral supplementation.  Hypovolemic hyponatremia: Now resolved.   DVT prophylaxis: lovenox Family Communication:husband Status is: Inpatient Remains inpatient appropriate because: Acute colonic obstruction    Code Status:     Code Status Orders  (From admission, onward)           Start     Ordered   03/11/22 1945  Full code  Continuous        03/11/22 1944           Code Status History     Date Active Date Inactive Code Status Order ID Comments User Context   02/08/2022 1836 02/10/2022 2013 Full Code [id number]  Marcelyn Bruins, MD ED         IV Access:    Peripheral IV   Procedures and diagnostic studies:   DG Abd Portable 1V  Result Date: 03/18/2022 CLINICAL DATA:  New generalized abdominal pain with worsening nausea overnight. EXAM: PORTABLE ABDOMEN - 1 VIEW COMPARISON:  Radiograph Mar 12, 2022 and CT Mar 11, 2022 FINDINGS: Nasogastric tube has been removed. No gas over the rectal vault. Gaseously distended loops of large and small bowel without pathologic dilation. IMPRESSION: Nasogastric tube has been removed. Gaseously distended loops of large and small bowel without pathologic dilation but no gas visualized over the rectal vault, in combination with findings from prior CT this may reflect an early/partial bowel obstruction consider continued radiographic surveillance. Electronically Signed   By: Dahlia Bailiff M.D.   On: 03/18/2022 08:57     Medical Consultants:   None.   Subjective:    Karen Kaufman nausea and vomiting has resolved eager to eat  Objective:    Vitals:   03/18/22 0622 03/18/22 1304 03/18/22 2118 03/19/22 0635  BP: 114/65 114/68 108/65 106/61  Pulse: 85 91 97 84  Resp: '18 15 18 16  '$ Temp: 98.6 F (37 C) 98.4 F (36.9 C) 98.1 F (36.7 C) 98.6 F (37 C)  TempSrc: Oral Oral Oral Oral  SpO2: 99% 98% 96% 96%  Height:       SpO2: 96 % O2 Flow Rate (L/min): 8 L/min   Intake/Output Summary (Last 24 hours) at 03/19/2022 0817 Last data filed at 03/19/2022 0600 Gross per 24 hour  Intake 2593.88 ml  Output 2300 ml  Net 293.88 ml    There were no vitals filed  for this visit.  Exam: General exam: In no acute distress. Respiratory system: Good air movement and clear to auscultation. Cardiovascular system: S1 & S2 heard, RRR. No JVD. Gastrointestinal system: Abdomen is nondistended, soft and nontender, ostomy in place. Extremities: No pedal edema. Skin: No rashes, lesions or ulcers Psychiatry: Judgement and insight appear normal. Mood & affect appropriate.   Data Reviewed:    Labs: Basic Metabolic  Panel: Recent Labs  Lab 03/13/22 0549 03/14/22 0404 03/15/22 0335 03/16/22 0320 03/17/22 0553 03/18/22 0353  NA 135 135 131* 136 135 136  K 3.6 3.3* 3.7 3.8 3.8 3.9  CL 103 102 98 104 103 100  CO2 '24 25 26 27 27 29  '$ GLUCOSE 103* 90 141* 125* 134* 121*  BUN 7* 8 <5* <5* <5* <5*  CREATININE 0.61 0.49 0.48 0.43* 0.44 0.45  CALCIUM 8.2* 7.9* 7.8* 8.2* 8.1* 8.7*  MG 2.0  --  2.0  --   --  1.9  PHOS 2.1* 1.8* 3.3  --   --  3.6    GFR CrCl cannot be calculated (Unknown ideal weight.). Liver Function Tests: Recent Labs  Lab 03/12/22 0921  AST 30  ALT 30  ALKPHOS 125  BILITOT 1.1  PROT 6.0*  ALBUMIN 3.1*    No results for input(s): LIPASE, AMYLASE in the last 168 hours. No results for input(s): AMMONIA in the last 168 hours. Coagulation profile No results for input(s): INR, PROTIME in the last 168 hours. COVID-19 Labs  No results for input(s): DDIMER, FERRITIN, LDH, CRP in the last 72 hours.  No results found for: SARSCOV2NAA  CBC: Recent Labs  Lab 03/15/22 0335 03/16/22 0320 03/17/22 0553 03/18/22 0353 03/19/22 0358  WBC 2.6* 3.0* 4.0 6.2 6.3  NEUTROABS 1.1* 1.0* 1.6* 3.6 3.5  HGB 10.2* 10.2* 9.6* 10.5* 9.3*  HCT 30.2* 30.7* 29.4* 31.5* 29.1*  MCV 90.1 90.8 91.6 90.8 92.4  PLT 262 328 301 458* 345    Cardiac Enzymes: No results for input(s): CKTOTAL, CKMB, CKMBINDEX, TROPONINI in the last 168 hours. BNP (last 3 results) No results for input(s): PROBNP in the last 8760 hours. CBG: No results for input(s): GLUCAP in the last 168 hours. D-Dimer: No results for input(s): DDIMER in the last 72 hours. Hgb A1c: No results for input(s): HGBA1C in the last 72 hours. Lipid Profile: No results for input(s): CHOL, HDL, LDLCALC, TRIG, CHOLHDL, LDLDIRECT in the last 72 hours. Thyroid function studies: No results for input(s): TSH, T4TOTAL, T3FREE, THYROIDAB in the last 72 hours.  Invalid input(s): FREET3 Anemia work up: No results for input(s): VITAMINB12,  FOLATE, FERRITIN, TIBC, IRON, RETICCTPCT in the last 72 hours. Sepsis Labs: Recent Labs  Lab 03/16/22 0320 03/17/22 0553 03/18/22 0353 03/19/22 0358  WBC 3.0* 4.0 6.2 6.3    Microbiology No results found for this or any previous visit (from the past 240 hour(s)).   Medications:    Chlorhexidine Gluconate Cloth  6 each Topical Daily   enoxaparin (LOVENOX) injection  40 mg Subcutaneous Q24H   feeding supplement  237 mL Oral BID BM   lip balm  1 application. Topical BID   mouth rinse  15 mL Mouth Rinse BID   metoCLOPramide (REGLAN) injection  10 mg Intravenous Q6H   pantoprazole (PROTONIX) IV  40 mg Intravenous Q12H   Continuous Infusions:  dextrose 5 % and 0.45 % NaCl with KCl 20 mEq/L 75 mL/hr at 03/19/22 0513   methocarbamol (ROBAXIN) IV 1,000 mg (03/19/22 0514)   ondansetron (ZOFRAN)  IV        LOS: 8 days   Charlynne Cousins  [facility]  03/19/2022, 8:17 AM

## 2022-03-19 NOTE — Progress Notes (Signed)
7 Days Post-Op  Subjective: CC: Feeling much better.  Reports some epigastric abdominal pain that is improved compared to yesterday.  Nausea, bloating, burping and belching have resolved.  No further emesis.  Tolerating small sips of CLD and about to try her first CLD tray this morning.  Having ostomy output.  Mobilizing in halls. Voiding.   Objective: Vital signs in last 24 hours: Temp:  [98.1 F (36.7 C)-98.6 F (37 C)] 98.6 F (37 C) (06/01 0635) Pulse Rate:  [84-97] 84 (06/01 0635) Resp:  [15-18] 16 (06/01 0635) BP: (106-114)/(61-68) 106/61 (06/01 0635) SpO2:  [96 %-98 %] 96 % (06/01 0635) Last BM Date : 03/18/22  Intake/Output from previous day: 05/31 0701 - 06/01 0700 In: 2593.9 [P.O.:390; I.V.:1797.4; IV Piggyback:406.4] Out: 2300 [Urine:1600; Stool:700] Intake/Output this shift: No intake/output data recorded.  PE: Gen:  Alert, NAD, pleasant Lungs: Normal rate and effort Abd: Less distended and soft. Some epigastric that is improved compared to yesterday and without peritonitis. +BS. Ostomy above skin level, dusky but productive with air and liquid stool in ostomy appliance. Laparoscopic incisions, c/d/i Ext:  No LE edema  Psych: A&Ox3   Lab Results:  Recent Labs    03/18/22 0353 03/19/22 0358  WBC 6.2 6.3  HGB 10.5* 9.3*  HCT 31.5* 29.1*  PLT 458* 345   BMET Recent Labs    03/17/22 0553 03/18/22 0353  NA 135 136  K 3.8 3.9  CL 103 100  CO2 27 29  GLUCOSE 134* 121*  BUN <5* <5*  CREATININE 0.44 0.45  CALCIUM 8.1* 8.7*   PT/INR No results for input(s): LABPROT, INR in the last 72 hours. CMP     Component Value Date/Time   NA 136 03/18/2022 0353   NA 143 01/26/2022 0830   K 3.9 03/18/2022 0353   CL 100 03/18/2022 0353   CO2 29 03/18/2022 0353   GLUCOSE 121 (H) 03/18/2022 0353   BUN <5 (L) 03/18/2022 0353   BUN 5 (L) 01/26/2022 0830   CREATININE 0.45 03/18/2022 0353   CALCIUM 8.7 (L) 03/18/2022 0353   PROT 6.0 (L) 03/12/2022 0921    PROT 6.5 01/26/2022 0830   ALBUMIN 3.1 (L) 03/12/2022 0921   ALBUMIN 4.5 01/26/2022 0830   AST 30 03/12/2022 0921   ALT 30 03/12/2022 0921   ALKPHOS 125 03/12/2022 0921   BILITOT 1.1 03/12/2022 0921   BILITOT 0.4 01/26/2022 0830   GFRNONAA >60 03/18/2022 0353   GFRAA 92 11/28/2020 0817   Lipase     Component Value Date/Time   LIPASE 24 03/11/2022 1057    Studies/Results: DG Abd Portable 1V  Result Date: 03/18/2022 CLINICAL DATA:  New generalized abdominal pain with worsening nausea overnight. EXAM: PORTABLE ABDOMEN - 1 VIEW COMPARISON:  Radiograph Mar 12, 2022 and CT Mar 11, 2022 FINDINGS: Nasogastric tube has been removed. No gas over the rectal vault. Gaseously distended loops of large and small bowel without pathologic dilation. IMPRESSION: Nasogastric tube has been removed. Gaseously distended loops of large and small bowel without pathologic dilation but no gas visualized over the rectal vault, in combination with findings from prior CT this may reflect an early/partial bowel obstruction consider continued radiographic surveillance. Electronically Signed   By: Dahlia Bailiff M.D.   On: 03/18/2022 08:57    Anti-infectives: Anti-infectives (From admission, onward)    Start     Dose/Rate Route Frequency Ordered Stop   03/12/22 0600  cefoTEtan (CEFOTAN) 2 g in sodium chloride 0.9 % 100 mL  IVPB        2 g 200 mL/hr over 30 Minutes Intravenous On call to O.R. 03/11/22 1508 03/12/22 1032   03/11/22 2000  neomycin (MYCIFRADIN) tablet 1,000 mg       See Hyperspace for full Linked Orders Report.   1,000 mg Oral 3 times per day 03/11/22 1508 03/12/22 1459   03/11/22 2000  metroNIDAZOLE (FLAGYL) tablet 1,000 mg       See Hyperspace for full Linked Orders Report.   1,000 mg Oral 3 times per day 03/11/22 1508 03/12/22 1459        Assessment/Plan POD# 7 s/p LAPAROSCOPIC ASSISTED DIVERTING COLOSTOMY 5/25 Dr. Johney Maine for Large bowel obstruction secondary to Metastatic colorectal cancer  with liver mets and LN mets, on chemo - Ileus seems to be improving.  We will ensure she is tolerating CLD before advancing diet - Monitor ostomy. Dusky but productive.  - WOCN following for new ostomy.  Malecot was removed on 5/26 - Oncology has seen and plans to resume systemic chemotherapy as outpatient - Mobilize, PT, no f/u   FEN: CLD, possible FLD this PM, IVF per [facility] VTE: SCDs, Lovenox ID: none currently. Afebrile. VSS. WBC wnl.  Foley: None    LOS: 8 days    Karen Kaufman , [facility] 03/19/2022, 8:21 AM Please see Amion for pager number during day hours 7:00am-4:30pm

## 2022-03-19 NOTE — TOC Progression Note (Signed)
Transition of Care [facility]) - Progression Note   Patient Details  Name: Karen Kaufman MRN: [id number] Date of Birth: October 08, 1961  Transition of Care [facility]) CM/SW [employer], LCSW Phone Number: 03/19/2022, 11:34 AM  Clinical Narrative: Patient will need [facility] for a new ostomy. Patient agreeable to referral to [facility] agencies. Patient referred to Cindie with [facility]. Referral was accepted, but the start of care will be 03/23/22 pending discharge. CSW updated patient. [place] agency added to AVS.  Expected Discharge Plan: [place] Barriers to Discharge: Continued Medical Work up  Expected Discharge Plan and Services Expected Discharge Plan: [place] In-house Referral: Clinical Social Work Post Acute Care Choice: [place] arrangements for the past 2 months: Single Family Home           DME Arranged: N/A DME Agency: NA HH Arranged: RN [place] Agency: [place] Date HH Agency Contacted: 03/19/22 Time HH Agency Contacted: 93 Representative spoke with at [place]: [place]  Readmission Risk Interventions    03/16/2022    9:22 AM  Readmission Risk Prevention Plan  [place] or Home Care Consult Complete  Social Work Consult for [place] Planning/Counseling Complete  Palliative Care Screening Not Applicable

## 2022-03-20 DIAGNOSIS — C189 Malignant neoplasm of colon, unspecified: Secondary | ICD-10-CM

## 2022-03-20 DIAGNOSIS — C787 Secondary malignant neoplasm of liver and intrahepatic bile duct: Secondary | ICD-10-CM

## 2022-03-20 DIAGNOSIS — E7800 Pure hypercholesterolemia, unspecified: Secondary | ICD-10-CM

## 2022-03-20 DIAGNOSIS — C20 Malignant neoplasm of rectum: Secondary | ICD-10-CM

## 2022-03-20 DIAGNOSIS — C19 Malignant neoplasm of rectosigmoid junction: Secondary | ICD-10-CM

## 2022-03-20 LAB — CBC WITH DIFFERENTIAL/PLATELET
Abs Immature Granulocytes: 0.59 10*3/uL — ABNORMAL HIGH (ref 0.00–0.07)
Basophils Absolute: 0 10*3/uL (ref 0.0–0.1)
Basophils Relative: 0 %
Eosinophils Absolute: 0.1 10*3/uL (ref 0.0–0.5)
Eosinophils Relative: 1 %
HCT: 29.8 % — ABNORMAL LOW (ref 36.0–46.0)
Hemoglobin: 9.7 g/dL — ABNORMAL LOW (ref 12.0–15.0)
Immature Granulocytes: 6 %
Lymphocytes Relative: 13 %
Lymphs Abs: 1.4 10*3/uL (ref 0.7–4.0)
MCH: 29.8 pg (ref 26.0–34.0)
MCHC: 32.6 g/dL (ref 30.0–36.0)
MCV: 91.7 fL (ref 80.0–100.0)
Monocytes Absolute: 0.9 10*3/uL (ref 0.1–1.0)
Monocytes Relative: 9 %
Neutro Abs: 7.5 10*3/uL (ref 1.7–7.7)
Neutrophils Relative %: 71 %
Platelets: 383 10*3/uL (ref 150–400)
RBC: 3.25 MIL/uL — ABNORMAL LOW (ref 3.87–5.11)
RDW: 12.4 % (ref 11.5–15.5)
WBC: 10.5 10*3/uL (ref 4.0–10.5)
nRBC: 0 % (ref 0.0–0.2)

## 2022-03-20 MED ORDER — HEPARIN SOD (PORK) LOCK FLUSH 100 UNIT/ML IV SOLN
500.0000 [IU] | INTRAVENOUS | Status: AC | PRN
  Administered 2022-03-20: 500 [IU]

## 2022-03-20 MED ORDER — ONDANSETRON 4 MG PO TBDP: 4.0000 mg | ORAL_TABLET | Freq: Three times a day (TID) | ORAL | 0 refills | Status: DC | PRN

## 2022-03-20 MED ORDER — HYDROCODONE-ACETAMINOPHEN 5-325 MG PO TABS: 1.0000 | ORAL_TABLET | Freq: Four times a day (QID) | ORAL | 0 refills | Status: AC | PRN

## 2022-03-20 NOTE — Progress Notes (Signed)
8 Days Post-Op  Subjective: CC: Doing well. Tolerating fld without n/v. Epigastric abdominal pain improving. Working on transitioning from IV pain meds to oral pain meds. Feels less bloated/distended. Having ostomy output. Patient worked with wocn yesterday and was able to perform all steps independently of apply pouch and opening/closing the bag. Mobilizing. Voiding.   Objective: Vital signs in last 24 hours: Temp:  [98.2 F (36.8 C)-99 F (37.2 C)] 98.3 F (36.8 C) (06/02 0557) Pulse Rate:  [87-96] 87 (06/02 0557) Resp:  [18] 18 (06/02 0557) BP: (109-124)/(65-69) 109/65 (06/02 0557) SpO2:  [94 %-99 %] 95 % (06/02 0557) Weight:  [65.3 kg] 65.3 kg (06/01 1100) Last BM Date : 03/19/22  Intake/Output from previous day: 06/01 0701 - 06/02 0700 In: 2669.4 [P.O.:1180; I.V.:1189.4; IV Piggyback:300] Out: 2325 [Urine:1800; Stool:525] Intake/Output this shift: No intake/output data recorded.  PE: Gen:  Alert, NAD, pleasant Lungs: Normal rate and effort Abd: Soft and ND. Some epigastric that is improved compared to yesterday and without peritonitis. +BS. Ostomy above skin level, dusky but productive with air and liquid stool in ostomy appliance. Laparoscopic incisions, c/d/i Psych: A&Ox3   Lab Results:  Recent Labs    03/19/22 0358 03/20/22 0335  WBC 6.3 10.5  HGB 9.3* 9.7*  HCT 29.1* 29.8*  PLT 345 383   BMET Recent Labs    03/18/22 0353  NA 136  K 3.9  CL 100  CO2 29  GLUCOSE 121*  BUN <5*  CREATININE 0.45  CALCIUM 8.7*   PT/INR No results for input(s): LABPROT, INR in the last 72 hours. CMP     Component Value Date/Time   NA 136 03/18/2022 0353   NA 143 01/26/2022 0830   K 3.9 03/18/2022 0353   CL 100 03/18/2022 0353   CO2 29 03/18/2022 0353   GLUCOSE 121 (H) 03/18/2022 0353   BUN <5 (L) 03/18/2022 0353   BUN 5 (L) 01/26/2022 0830   CREATININE 0.45 03/18/2022 0353   CALCIUM 8.7 (L) 03/18/2022 0353   PROT 6.0 (L) 03/12/2022 0921   PROT 6.5  01/26/2022 0830   ALBUMIN 3.1 (L) 03/12/2022 0921   ALBUMIN 4.5 01/26/2022 0830   AST 30 03/12/2022 0921   ALT 30 03/12/2022 0921   ALKPHOS 125 03/12/2022 0921   BILITOT 1.1 03/12/2022 0921   BILITOT 0.4 01/26/2022 0830   GFRNONAA >60 03/18/2022 0353   GFRAA 92 11/28/2020 0817   Lipase     Component Value Date/Time   LIPASE 24 03/11/2022 1057    Studies/Results: DG Abd Portable 1V  Result Date: 03/18/2022 CLINICAL DATA:  New generalized abdominal pain with worsening nausea overnight. EXAM: PORTABLE ABDOMEN - 1 VIEW COMPARISON:  Radiograph Mar 12, 2022 and CT Mar 11, 2022 FINDINGS: Nasogastric tube has been removed. No gas over the rectal vault. Gaseously distended loops of large and small bowel without pathologic dilation. IMPRESSION: Nasogastric tube has been removed. Gaseously distended loops of large and small bowel without pathologic dilation but no gas visualized over the rectal vault, in combination with findings from prior CT this may reflect an early/partial bowel obstruction consider continued radiographic surveillance. Electronically Signed   By: Dahlia Bailiff M.D.   On: 03/18/2022 08:57    Anti-infectives: Anti-infectives (From admission, onward)    Start     Dose/Rate Route Frequency Ordered Stop   03/12/22 0600  cefoTEtan (CEFOTAN) 2 g in sodium chloride 0.9 % 100 mL IVPB        2 g 200 mL/hr  over 30 Minutes Intravenous On call to O.R. 03/11/22 1508 03/12/22 1032   03/11/22 2000  neomycin (MYCIFRADIN) tablet 1,000 mg       See Hyperspace for full Linked Orders Report.   1,000 mg Oral 3 times per day 03/11/22 1508 03/12/22 1459   03/11/22 2000  metroNIDAZOLE (FLAGYL) tablet 1,000 mg       See Hyperspace for full Linked Orders Report.   1,000 mg Oral 3 times per day 03/11/22 1508 03/12/22 1459        Assessment/Plan POD# 8 s/p LAPAROSCOPIC ASSISTED DIVERTING COLOSTOMY 5/25 Dr. Johney Maine for Large bowel obstruction secondary to Metastatic colorectal cancer with liver  mets and LN mets, on chemo - Monitor ostomy. Dusky but productive.  - WOCN following for new ostomy.  Malecot was removed on 5/26. Will refer to ostomy clinic - Oncology has seen and plans to resume systemic chemotherapy as outpatient - Mobilize, PT, no f/u - Transition to PO pain meds - Adv diet - If patient is tolerating diet and pain well controlled on oral pain medication, she is okay for discharge from our standpoint. Discussed discharge instructions, restrictions and return/call back precautions with patient. Will arrange follow up and let [facility] know.   FEN: Soft diet. IVF per [facility] VTE: SCDs, Lovenox ID: Periop abx. None currently. Afebrile. VSS. WBC wnl at 10.5  Foley: None    LOS: 9 days    Jillyn Ledger , [facility] 03/20/2022, 7:55 AM Please see Amion for pager number during day hours 7:00am-4:30pm

## 2022-03-20 NOTE — TOC Transition Note (Signed)
Transition of Care [facility]) - CM/SW Discharge Note  Patient Details  Name: Karen Kaufman MRN: [id number] Date of Birth: 1961/01/15  Transition [facility]) CM/SW Contact:  Sherie Don, LCSW Phone Number: 03/20/2022, 12:20 PM  Clinical Narrative: Patient discharging home today. HHRN orders are in. CSW notified Cindie with [facility] of discharge. TOC signing off.  Final next level of care: [place] Barriers to Discharge: Barriers Resolved  Patient Goals and CMS Choice Patient states their goals for this hospitalization and ongoing recovery are:: Return home with [facility] for new ostomy CMS Medicare.gov Compare Post Acute Care list provided to:: Patient Choice offered to / list presented to : Patient  Discharge Plan and Services In-house Referral: Clinical Social Work Post Acute Care Choice: Home Health          DME Arranged: N/A DME Agency: NA HH Arranged: RN [place] Agency: [place] Date [facility] Agency Contacted: 03/19/22 Time HH Agency Contacted: 34 Representative spoke with at [place]: [place]  Readmission Risk Interventions    03/16/2022    9:22 AM  Readmission Risk Prevention Plan  [place] or Home Care Consult Complete  Social Work Consult for [place] Planning/Counseling Complete  Palliative Care Screening Not Applicable

## 2022-03-20 NOTE — Discharge Instructions (Signed)
[facility]  Please arrive at least 30 min before your appointment to complete your check in paperwork.  If you are unable to arrive 30 min prior to your appointment time we may have to cancel or reschedule you. LAPAROSCOPIC SURGERY: POST OP INSTRUCTIONS Always review your discharge instruction sheet given to you by the facility where your surgery was performed. IF YOU HAVE DISABILITY OR FAMILY LEAVE FORMS, YOU MUST BRING THEM TO THE OFFICE FOR PROCESSING.   DO NOT GIVE THEM TO YOUR DOCTOR.  PAIN CONTROL  First take acetaminophen (Tylenol) AND/or ibuprofen (Advil) to control your pain after surgery.  Follow directions on package.  Taking acetaminophen (Tylenol) and/or ibuprofen (Advil) regularly after surgery will help to control your pain and lower the amount of prescription pain medication you may need.  You should not take more than 4,000 mg (4 grams) of acetaminophen (Tylenol) in 24 hours.  You should not take ibuprofen (Advil), aleve, motrin, naprosyn or other NSAIDS if you have a history of stomach ulcers or chronic kidney disease.  A prescription for pain medication may be given to you upon discharge.  Take your pain medication as prescribed, if you still have uncontrolled pain after taking acetaminophen (Tylenol) or ibuprofen (Advil). Use ice packs to help control pain. If you need a refill on your pain medication, please contact your pharmacy.  They will contact our office to request authorization. Prescriptions will not be filled after 5pm or on week-ends.  HOME MEDICATIONS Take your usually prescribed medications unless otherwise directed.  DIET You should follow a light diet the first few days after arrival home.  Be sure to include lots of fluids daily. Avoid fatty, fried foods.   CONSTIPATION It is common to experience some constipation after surgery and if you are taking pain medication.  Increasing fluid intake and taking a stool softener (such as Colace)  will usually help or prevent this problem from occurring.  A mild laxative (Milk of Magnesia or Miralax) should be taken according to package instructions if there are no bowel movements after 48 hours.  WOUND/INCISION CARE Most patients will experience some swelling and bruising in the area of the incisions.  Ice packs will help.  Swelling and bruising can take several days to resolve.  Unless discharge instructions indicate otherwise, follow guidelines below  STERI-STRIPS - you may remove your outer bandages 48 hours after surgery, and you may shower at that time.  You have steri-strips (small skin tapes) in place directly over the incision.  These strips should be left on the skin for 7-10 days.   DERMABOND/SKIN GLUE - you may shower in 24 hours.  The glue will flake off over the next 2-3 weeks. Any sutures or staples will be removed at the office during your follow-up visit.  ACTIVITIES You may resume regular (light) daily activities beginning the next day--such as daily self-care, walking, climbing stairs--gradually increasing activities as tolerated.  You may have sexual intercourse when it is comfortable.  Refrain from any heavy lifting or straining until approved by your doctor. You may drive when you are no longer taking prescription pain medication, you can comfortably wear a seatbelt, and you can safely maneuver your car and apply brakes.  FOLLOW-UP You should see your doctor in the office for a follow-up appointment approximately 2-3 weeks after your surgery.  You should have been given your post-op/follow-up appointment when your surgery was scheduled.  If you did not receive a post-op/follow-up appointment, make sure   that you call for this appointment within a day or two after you arrive home to insure a convenient appointment time.   WHEN TO CALL YOUR DOCTOR: Fever over 101.0 Inability to urinate Continued bleeding from incision. Increased pain, redness, or drainage from the  incision. Increasing abdominal pain  The clinic staff is available to answer your questions during regular business hours.  Please don't hesitate to call and ask to speak to one of the nurses for clinical concerns.  If you have a medical emergency, go to the nearest emergency room or call 911.  A surgeon from [facility] is always on call at the hospital. [address], [address], [place], [place]  [address] ? [address] [address], [place], [place]   [address] [phone] ? [phone] ? FAX [phone]  

## 2022-03-20 NOTE — Progress Notes (Signed)
Reviewed written d/c instructions w pt and all questions answered. She verbalized understanding. D/C via w/c w all belongings in stable condition. 

## 2022-03-20 NOTE — Discharge Summary (Addendum)
Physician Discharge Summary  Karen Kaufman HUT:[id number] DOB: Mar 08, 1961 DOA: 03/11/2022  PCP: Gwenlyn Perking, FNP  Admit date: 03/11/2022 Discharge date: 03/20/2022  Admitted From: Home Disposition:  Home  Recommendations for Outpatient Follow-up:  Follow up with Surgery in 1-2 weeks Please obtain BMP/CBC in one week   Home Health:No Equipment/Devices:None  Discharge Condition:Stable CODE STATUS:Full Diet recommendation: Heart Healthy   Brief/Interim Summary:  61 y.o. female past medical history significant for colorectal cancer presented with abdominal pain and distention found to have have a small bowel obstruction from her colonic mass lab works scopic assisted diverting colostomy 03/12/2022.  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 NG tube was placed she was to follow with serial abdominal x-rays and bowel gas pattern improved. NG tube was discontinued, her diet was advanced. She will go home on Zofran and follow-up with surgery as an outpatient.  Colorectal cancer with metastases: Follow-up with oncology as an outpatient diagnosed in April 2023. She status post 1 cycle of chemotherapy as an outpatient. She will resume chemotherapy as per oncology as an outpatient.  Neutropenia: Likely due to chemotherapy resolved.  Normocytic anemia: Multifactorial likely due to chemotherapy and surgery her hemoglobin remained relatively stable she will follow-up with oncology as an outpatient to get CBC as an outpatient.  L1 vertebral compression fracture: She  remained symptomatic renal therapy evaluated the patient recommended home health PT.  Hypokalemia: It was repleted orally we will try to keep potassium greater than 4 magnesium greater than 2.  Hypophosphatemia: Improved with oral supplementation now resolved.  Hypovolemic hyponatremia: Resolved with fluid resuscitation resolved  Discharge Instructions  Discharge Instructions     Amb Referral to Ostomy Clinic   Complete by: As directed    Reason for referral modifiers: Pre and post-operative counseling for ostomy management   Diet - low sodium heart healthy   Complete by: As directed    Increase activity slowly   Complete by: As directed    No wound care   Complete by: As directed       Allergies as of 03/20/2022       Reactions   Iodine Anaphylaxis   Shellfish Allergy Anaphylaxis   Hydromorphone    "Burning Sensation"        Medication List     STOP taking these medications    polyethylene glycol 17 g packet Commonly known as: MIRALAX / GLYCOLAX   vitamin B-12 1000 MCG tablet Commonly known as: CYANOCOBALAMIN       TAKE these medications    atorvastatin 20 MG tablet Commonly known as: LIPITOR Take 1 tablet (20 mg total) by mouth daily.   dicyclomine 10 MG capsule Commonly known as: BENTYL Take 2 capsules (20 mg total) by mouth every 6 (six) hours as needed for spasms (cramping).   docusate sodium 100 MG capsule Commonly known as: COLACE Take 200 mg by mouth daily.   HYDROcodone-acetaminophen 5-325 MG tablet Commonly known as: Norco Take 1-2 tablets by mouth every 6 (six) hours as needed for up to 5 days for moderate pain.   lidocaine-prilocaine cream Commonly known as: EMLA Apply to  port site 1-2 hours prior to use   magnesium oxide 400 MG tablet Commonly known as: MAG-OX Take 400 mg by mouth daily.   ondansetron 4 MG disintegrating tablet Commonly known as: ZOFRAN-ODT Take 1 tablet (4 mg total) by mouth every 8 (eight) hours as needed  for nausea or vomiting.   ondansetron 8 MG tablet Commonly known as: ZOFRAN Take 1 tablet (8 mg total) by mouth every 8 (eight) hours as needed for nausea or vomiting. Do not take until 72 hours after chemo   prochlorperazine 10 MG tablet Commonly known as: COMPAZINE Take 1 tablet (10 mg total) by mouth every 6 (six) hours as needed for nausea or vomiting.   simethicone 80 MG chewable tablet Commonly known as: MYLICON Chew 80 mg by mouth every 6 (six) hours as needed for flatulence.   Vitamin D (Ergocalciferol) 1.25 MG (50000 UNIT) Caps capsule Commonly known as: DRISDOL Take 1 capsule (50,000 Units total) by mouth every 7 (seven) days.        Follow-up Information     [facility] Follow up.   Specialty: Home Health Services Why: Alvis Lemmings will provide nursing for your new ostomy. Contact information: [address] [place] [address] [phone]         Gwenlyn Perking, FNP Follow up.   Specialty: Family Medicine Contact information: [place] [address] [phone]         Ladell Pier, MD. Call.   Specialty: Oncology Contact information: [place] [address] [phone]         Michael Boston, MD Follow up on 04/06/2022.   Specialties: General Surgery, Colon and Rectal Surgery Why: 6/19 arrive at 1:15. Please bring a copy of your photo ID and insurance card. Please arrive 30 minutes prior to your appointment for paperwork. Contact information: [address] [place] [address] [phone]                Allergies  Allergen Reactions   Iodine Anaphylaxis   Shellfish Allergy Anaphylaxis   Hydromorphone     "Burning Sensation"    Consultations: General surgery   Procedures/Studies: CT ABDOMEN PELVIS WO CONTRAST  Result Date: 03/11/2022 CLINICAL DATA:  Abdominal pain and bloating for several days. No bowel movement for 4 days. History of metastatic  rectal cancer. * Tracking Code: BO * EXAM: CT ABDOMEN AND PELVIS WITHOUT CONTRAST TECHNIQUE: Multidetector CT imaging of the abdomen and pelvis was performed following the standard protocol without IV contrast. RADIATION DOSE REDUCTION: This exam was performed according to the departmental dose-optimization program which includes automated exposure control, adjustment of the mA and/or kV according to patient size and/or use of iterative reconstruction technique. COMPARISON:  03/01/2022 MRI abdomen.  02/08/2022 CT abdomen/pelvis. FINDINGS: Lower chest: No acute abnormality at the lung bases. Mildly prominent 0.8 cm right pericardiophrenic lymph node (series 2/image 10), increased from 0.6 cm on 02/08/2022 CT. Partially visualized bilateral breast prostheses. Hepatobiliary: Several (greater than 5) ill-defined hypodense liver masses scattered throughout the liver, appearing mildly increased since 02/08/2022 CT. Representative 2.4 cm posterior right liver mass (series 2/image 17), increased from 1.8 cm. Central right liver 1.5 cm mass (series 2/image 12), mildly increased from 1.2 cm. Anterior left liver 1.3 cm mass (series 2/image 13), increased from 1.1 cm. Normal gallbladder with no radiopaque cholelithiasis. No biliary ductal dilatation. Pancreas: Normal, with no mass or duct dilation. Spleen: Normal size. No mass. Adrenals/Urinary Tract:  Normal adrenals. No renal stones. No hydronephrosis. Simple 1.4 cm medial interpolar right renal cyst, for which no follow-up is recommended. Otherwise no contour deforming renal masses. Normal bladder. Stomach/Bowel: Normal non-distended stomach. No dilated small bowel loops. Scattered air-fluid levels in the distal small bowel. No small bowel wall thickening. Mildly dilated fluid-filled appendix without appendiceal wall thickening or significant periappendiceal fat stranding. Annular wall thickening at the rectosigmoid junction involving an approximately 8 cm in length segment,  proximal to which the colon is prominently distended with widespread colonic air-fluid levels. Cecal diameter 8.8 cm. Vascular/Lymphatic: Normal caliber abdominal aorta. Left para-lymphadenopathy up to 1.0 cm (series 2/image 36), increased from 0.7 cm. Multiple enlarged left perirectal nodes, largest 1.6 cm short axis diameter (series 2/image 65), stable. Reproductive: Status post hysterectomy, with no abnormal findings at the vaginal cuff. No adnexal mass. Other: No pneumoperitoneum. Small volume pelvic ascites. Presacral space ill-defined fluid and perirectal fat stranding is mildly increased. No focal fluid collection. Musculoskeletal: Moderate L1 vertebral compression fracture is new since 02/08/2022 CT, without definite discrete osseous lesions. Mild thoracolumbar spondylosis. IMPRESSION: 1. Distal large bowel obstruction at the site of a large annular mass at the rectosigmoid junction. 2. Progressive liver metastases. 3. Progressive perirectal, left para-aortic and right pericardiophrenic nodal metastases. 4. Small volume pelvic ascites. 5. Moderate L1 vertebral compression fracture, new since 02/08/2022 CT, without definite underlying osseous lesion. Electronically Signed   By: Ilona Sorrel M.D.   On: 03/11/2022 13:38   DG Chest 1 View  Addendum Date: 03/12/2022   ADDENDUM REPORT: 03/12/2022 10:53 ADDENDUM: Requested addendum. The Port-A-Cath reservoir appears in expected position, does not appear flipped. Electronically Signed   By: Lavonia Dana M.D.   On: 03/12/2022 10:53   Result Date: 03/12/2022 CLINICAL DATA:  Port-A-Cath placement EXAM: CHEST  1 VIEW COMPARISON:  Portable exam 0851 hours without priors for comparison FINDINGS: RIGHT jugular Port-A-Cath with tip projecting over SVC. Normal heart size, mediastinal contours, and pulmonary vascularity. Lungs clear. No infiltrate, pleural effusion, or pneumothorax. Bones appear demineralized. IMPRESSION: No pneumothorax following RIGHT jugular  Port-A-Cath insertion. Electronically Signed: By: Lavonia Dana M.D. On: 03/12/2022 09:54   MR ABDOMEN WWO CONTRAST  Result Date: 03/03/2022 CLINICAL DATA:  61 year old female with history of rectal cancer with multiple liver lesions noted on prior CT examination. Follow-up evaluation to assess for potential metastatic disease to the liver. * Tracking Code: BO * EXAM: MRI ABDOMEN WITHOUT AND WITH CONTRAST TECHNIQUE: Multiplanar multisequence MR imaging of the abdomen was performed both before and after the administration of intravenous contrast. CONTRAST:  53m MULTIHANCE GADOBENATE DIMEGLUMINE 529 MG/ML IV SOLN COMPARISON:  No prior abdominal MRI. CT the chest, CT the abdomen and pelvis 02/08/2022. FINDINGS: Lower chest: Bilateral breast implants are incidentally noted. Hepatobiliary: Multiple hepatic lesions are scattered throughout all aspects of the liver, which are T1 hypointense, mildly T2 hyperintense, demonstrate diffusion restriction and appear hypovascular on post gadolinium images, compatible with widespread metastatic disease to the liver. The largest of these is in segment 8 (axial image 30 of series 13) measuring 1.9 x 1.4 cm. No intra or extrahepatic biliary ductal dilatation. Gallbladder is unremarkable in appearance. Pancreas: No pancreatic mass. No pancreatic ductal dilatation. No pancreatic or peripancreatic fluid collections or inflammatory changes. Spleen:  Unremarkable. Adrenals/Urinary Tract: In the medial aspect of the interpolar region of the right kidney there is a 1.5 cm lesion which is T1 hypointense, T2 hyperintense, and does not enhance, compatible with a simple cyst. Left kidney and bilateral  adrenal glands are normal in appearance. No hydroureteronephrosis in the visualized portions of the abdomen. Bilateral adrenal glands are normal in appearance. Stomach/Bowel: Visualized portions are unremarkable. Vascular/Lymphatic: No aneurysm identified in the visualized abdominal vasculature.  Multiple mildly enlarged and borderline enlarged para-aortic lymph nodes measuring up to 1.2 cm in short axis (axial image 65 of series 13) on the left. Other: No significant volume of ascites noted in the visualized portions of the peritoneal cavity. Musculoskeletal: No definite aggressive appearing osseous lesions are noted in the visualized portions of the skeleton. IMPRESSION: 1. Numerous hypovascular hepatic lesions have imaging characteristics considered diagnostic of widespread metastatic disease to the liver. There is also retroperitoneal lymphadenopathy concerning for metastatic disease, as above. Electronically Signed   By: Vinnie Langton M.D.   On: 03/03/2022 08:16   IR US Guide Bx Asp/Drain  Result Date: 02/20/2022 INDICATION: History of rectal carcinoma. Liver lesion noted on recent CT. EXAM: ULTRASOUND-GUIDED CORE LIVER LESION BIOPSY MEDICATIONS: No periprocedural antibiotics were indicated. ANESTHESIA/SEDATION: Intravenous Fentanyl 121mg and Versed '2mg'$  were administered as conscious sedation during continuous monitoring of the patient's level of consciousness and physiological / cardiorespiratory status by the radiology RN, with a total moderate sedation time of 38 minutes. COMPLICATIONS: None immediate. PROCEDURE: Informed written consent was obtained from the patient after a thorough discussion of the procedural risks, benefits and alternatives. All questions were addressed. Maximal Sterile Barrier Technique was utilized including caps, mask, sterile gowns, sterile gloves, sterile drape, hand hygiene and skin antiseptic. A timeout was performed prior to the initiation of the procedure. Survey ultrasound of the right lobe of the liver was performed. A representative lesion was localized, and appropriate skin entry site was determined and marked. Skin was prepped with chlorhexidine, draped in usual sterile fashion, infiltrated locally with 1% lidocaine. Under real-time ultrasound guidance, 17  gauge trocar needle advanced to the margin of the lesion. Multiple coaxial 18 gauge core biopsy samples were obtained under real-time ultrasound guidance. The guide needle was removed. Postprocedure scan shows no hemorrhage or other apparent complication. The patient tolerated the procedure well. IMPRESSION: 1. Technically successful ultrasound-guided core biopsy, liver lesion. Electronically Signed   By: DLucrezia EuropeM.D.   On: 02/20/2022 16:24   DG Abd Portable 1V  Result Date: 03/18/2022 CLINICAL DATA:  New generalized abdominal pain with worsening nausea overnight. EXAM: PORTABLE ABDOMEN - 1 VIEW COMPARISON:  Radiograph Mar 12, 2022 and CT Mar 11, 2022 FINDINGS: Nasogastric tube has been removed. No gas over the rectal vault. Gaseously distended loops of large and small bowel without pathologic dilation. IMPRESSION: Nasogastric tube has been removed. Gaseously distended loops of large and small bowel without pathologic dilation but no gas visualized over the rectal vault, in combination with findings from prior CT this may reflect an early/partial bowel obstruction consider continued radiographic surveillance. Electronically Signed   By: JDahlia BailiffM.D.   On: 03/18/2022 08:57   DG Abd Portable 1V  Result Date: 03/12/2022 CLINICAL DATA:  NG tube placement EXAM: PORTABLE ABDOMEN - 1 VIEW COMPARISON:  01/07/2022 FINDINGS: AP image of upper abdomen and chest shows tip nasogastric tube to be in the region of distal antrum of the stomach. There is another ostomy catheter in the left mid abdomen. Tip of right IJ chest port is seen in the superior vena cava close to the right atrium. Small linear densities in the lower lung fields suggest subsegmental atelectasis. There is mild dilation of small-bowel loops in the upper abdomen. Lower abdomen and pelvis  are not included in the image. IMPRESSION: Tip enteric tube is seen in the distal antrum of the stomach. Electronically Signed   By: Elmer Picker M.D.    On: 03/12/2022 13:45   IR IMAGING GUIDED PORT INSERTION  Result Date: 02/20/2022 CLINICAL DATA:  Rectal carcinoma with liver lesions. Needs durable venous access for planned treatment regimen. EXAM: TUNNELED PORT CATHETER PLACEMENT WITH ULTRASOUND AND FLUOROSCOPIC GUIDANCE FLUOROSCOPY: Radiation Exposure Index (as provided by the fluoroscopic device): 1 mGy air Kerma ANESTHESIA/SEDATION: Intravenous Fentanyl 50mg and Versed '1mg'$  were administered as conscious sedation during continuous monitoring of the patient's level of consciousness and physiological / cardiorespiratory status by the radiology RN, with a total moderate sedation time of 20 minutes. TECHNIQUE: The procedure, risks, benefits, and alternatives were explained to the patient. Questions regarding the procedure were encouraged and answered. The patient understands and consents to the procedure. Patency of the right IJ vein was confirmed with ultrasound with image documentation. An appropriate skin site was determined. Skin site was marked. Region was prepped using maximum barrier technique including cap and mask, sterile gown, sterile gloves, large sterile sheet, and Chlorhexidine as cutaneous antisepsis. The region was infiltrated locally with 1% lidocaine. Under real-time ultrasound guidance, the right IJ vein was accessed with a 21 gauge micropuncture needle; the needle tip within the vein was confirmed with ultrasound image documentation. Needle was exchanged over a 018 guidewire for transitional dilator, and vascular measurement was performed. A small incision was made on the right anterior chest wall and a subcutaneous pocket fashioned. The power-injectable port was positioned and its catheter tunneled to the right IJ dermatotomy site. The transitional dilator was exchanged over an Amplatz wire for a peel-away sheath, through which the port catheter, which had been trimmed to the appropriate length, was advanced and positioned under fluoroscopy  with its tip at the cavoatrial junction. Spot chest radiograph confirms good catheter position and no pneumothorax. The port was flushed per protocol. The pocket was closed with deep interrupted and subcuticular continuous 3-0 Monocryl sutures. The incisions were covered with Dermabond then covered with a sterile dressing. The patient tolerated the procedure well. COMPLICATIONS: COMPLICATIONS None immediate IMPRESSION: Technically successful right IJ power-injectable port catheter placement. Ready for routine use. Electronically Signed   By: DLucrezia EuropeM.D.   On: 02/20/2022 16:24   (Echo, Carotid, EGD, Colonoscopy, ERCP)    Subjective: No complaints  Discharge Exam: Vitals:   03/19/22 2130 03/20/22 0557  BP: 124/65 109/65  Pulse: 89 87  Resp: 18 18  Temp: 99 F (37.2 C) 98.3 F (36.8 C)  SpO2: 94% 95%   Vitals:   03/19/22 1100 03/19/22 1402 03/19/22 2130 03/20/22 0557  BP:  113/67 124/65 109/65  Pulse:  87 89 87  Resp:  '18 18 18  '$ Temp:  98.2 F (36.8 C) 99 F (37.2 C) 98.3 F (36.8 C)  TempSrc:  Oral Oral Oral  SpO2:  96% 94% 95%  Weight: 65.3 kg     Height: '5\' 6"'$  (1.676 m)       General: Pt is alert, awake, not in acute distress Cardiovascular: RRR, S1/S2 +, no rubs, no gallops Respiratory: CTA bilaterally, no wheezing, no rhonchi Abdominal: Soft, NT, ND, bowel sounds + Extremities: no edema, no cyanosis    The results of significant diagnostics from this hospitalization (including imaging, microbiology, ancillary and laboratory) are listed below for reference.     Microbiology: No results found for this or any previous visit (from the past 240  hour(s)).   Labs: BNP (last 3 results) No results for input(s): BNP in the last 8760 hours. Basic Metabolic Panel: Recent Labs  Lab 03/14/22 0404 03/15/22 0335 03/16/22 0320 03/17/22 0553 03/18/22 0353  NA 135 131* 136 135 136  K 3.3* 3.7 3.8 3.8 3.9  CL 102 98 104 103 100  CO2 '25 26 27 27 29  '$ GLUCOSE 90 141* 125*  134* 121*  BUN 8 <5* <5* <5* <5*  CREATININE 0.49 0.48 0.43* 0.44 0.45  CALCIUM 7.9* 7.8* 8.2* 8.1* 8.7*  MG  --  2.0  --   --  1.9  PHOS 1.8* 3.3  --   --  3.6   Liver Function Tests: No results for input(s): AST, ALT, ALKPHOS, BILITOT, PROT, ALBUMIN in the last 168 hours. No results for input(s): LIPASE, AMYLASE in the last 168 hours. No results for input(s): AMMONIA in the last 168 hours. CBC: Recent Labs  Lab 03/16/22 0320 03/17/22 0553 03/18/22 0353 03/19/22 0358 03/20/22 0335  WBC 3.0* 4.0 6.2 6.3 10.5  NEUTROABS 1.0* 1.6* 3.6 3.5 7.5  HGB 10.2* 9.6* 10.5* 9.3* 9.7*  HCT 30.7* 29.4* 31.5* 29.1* 29.8*  MCV 90.8 91.6 90.8 92.4 91.7  PLT 328 301 458* 345 383   Cardiac Enzymes: No results for input(s): CKTOTAL, CKMB, CKMBINDEX, TROPONINI in the last 168 hours. BNP: Invalid input(s): POCBNP CBG: No results for input(s): GLUCAP in the last 168 hours. D-Dimer No results for input(s): DDIMER in the last 72 hours. Hgb A1c No results for input(s): HGBA1C in the last 72 hours. Lipid Profile No results for input(s): CHOL, HDL, LDLCALC, TRIG, CHOLHDL, LDLDIRECT in the last 72 hours. Thyroid function studies No results for input(s): TSH, T4TOTAL, T3FREE, THYROIDAB in the last 72 hours.  Invalid input(s): FREET3 Anemia work up No results for input(s): VITAMINB12, FOLATE, FERRITIN, TIBC, IRON, RETICCTPCT in the last 72 hours. Urinalysis    Component Value Date/Time   COLORURINE YELLOW 03/11/2022 1116   APPEARANCEUR CLEAR 03/11/2022 1116   LABSPEC 1.020 03/11/2022 1116   PHURINE 5.0 03/11/2022 1116   GLUCOSEU NEGATIVE 03/11/2022 1116   HGBUR NEGATIVE 03/11/2022 1116   BILIRUBINUR NEGATIVE 03/11/2022 1116   KETONESUR 80 (A) 03/11/2022 1116   PROTEINUR NEGATIVE 03/11/2022 1116   NITRITE NEGATIVE 03/11/2022 1116   [place] 03/11/2022 1116   Sepsis Labs Invalid input(s): PROCALCITONIN,  WBC,  LACTICIDVEN Microbiology No results found for this or any  previous visit (from the past 240 hour(s)).    SIGNED:   Charlynne Cousins, MD  [facility] 03/20/2022, 11:31 AM Pager   If 7PM-7AM, please contact night-coverage [email or web address] Password [email or web address]

## 2022-03-20 NOTE — Consult Note (Signed)
[place] Nurse ostomy follow up Requested to see patient prior to discharge.  Patient was able to perform pouch change independently yesterday with husband at the bedside during teaching session.  Offered to perform pouch change again with patient but she declined and stated, "I fell fine doing it." She requested practicing emptying again and was able to open and close Velcro to empty 50 cc brown liquid stool without assistance.  Answered various ostomy-related questions and reviewed pouching routines. Pt plans to discharge today. Please re-consult if further assistance is needed.  Thank-you,  Julien Girt MSN, [place], [place], [place], [place]

## 2022-03-25 ENCOUNTER — Telehealth: Payer: Self-pay | Admitting: *Deleted

## 2022-03-25 NOTE — Telephone Encounter (Signed)
Karen Kaufman was contacted by telephone to verify understanding of discharge instructions status post their most recent discharge from the hospital on the date:  03/20/2022.  Inpatient discharge AVS was re-reviewed with patient, along with cancer center appointments.  Verification of understanding for oncology specific follow-up was validated using the Teach Back method.   She is doing well with her colostomy and reports feeling much better now. Energy coming back and eating better. Home Health RN saw her once and she has been discharged. Sees wound/ostomy nurse in clinic on 6/09.  Transportation to appointments were confirmed for the patient as being self/caregiver.  Henry Decamp's questions were addressed to their satisfaction upon completion of this post discharge follow-up call for outpatient oncology.

## 2022-03-27 ENCOUNTER — Encounter (HOSPITAL_COMMUNITY)
Admission: RE | Admit: 2022-03-27 | Discharge: 2022-03-27 | Disposition: A | Discharge status: Home / Self Care | Payer: BC Managed Care – PPO | Source: Ambulatory Visit | Attending: Nurse Practitioner | Admitting: Nurse Practitioner

## 2022-03-27 DIAGNOSIS — C20 Malignant neoplasm of rectum: Secondary | ICD-10-CM | POA: Insufficient documentation

## 2022-03-27 DIAGNOSIS — Z933 Colostomy status: Secondary | ICD-10-CM | POA: Insufficient documentation

## 2022-03-27 DIAGNOSIS — K9400 Colostomy complication, unspecified: Secondary | ICD-10-CM

## 2022-03-27 NOTE — Discharge Instructions (Signed)
Pouch # [id number] with filter Barrier ring 4"  M9

## 2022-03-27 NOTE — Progress Notes (Signed)
[facility]   Reason for visit:  LUQ colostomy HPI:  Rectal cancer, colostomy ROS  Review of Systems  Constitutional: Negative.   Gastrointestinal:        LUQ colostomy  Skin: Negative.   All other systems reviewed and are negative.  Vital signs:  BP 127/79 (BP Location: Right Arm)   Pulse 95   Temp 98.1 F (36.7 C) (Oral)   Resp 20   SpO2 99%  Exam:  Physical Exam Abdominal:     General: Bowel sounds are normal.  Skin:    General: Skin is warm and dry.  Neurological:     Mental Status: She is alert.     Stoma type/location:  LUQ colostomy Stomal assessment/size:  2" ruborous (was dusky in hospital) is moist today Peristomal assessment:  intact Treatment options for stomal/peristomal skin: barrier ring and 2 piece pouch Output: soft brown stool Ostomy pouching:   1piece filtered pouch with barrier ring Education provided:  Patient and spouse agree that pouching has been going smoothly.      Impression/dx  Colostomy complication Discussion  Set up with [employer] for supplies  PRefers transparent pouches Plan  Set up with [employer]der supplies POuch [phone] Barrier ring M9 deodorant See back as needed    Visit time: 40 minutes.   Domenic Moras FNP-BC

## 2022-03-28 ENCOUNTER — Other Ambulatory Visit: Payer: Self-pay | Admitting: Oncology

## 2022-03-30 ENCOUNTER — Inpatient Hospital Stay: Payer: BC Managed Care – PPO

## 2022-03-30 ENCOUNTER — Inpatient Hospital Stay: Payer: BC Managed Care – PPO | Admitting: Nurse Practitioner

## 2022-03-31 ENCOUNTER — Inpatient Hospital Stay: Payer: BC Managed Care – PPO

## 2022-03-31 ENCOUNTER — Other Ambulatory Visit: Payer: Self-pay

## 2022-03-31 ENCOUNTER — Encounter: Payer: Self-pay | Admitting: Nurse Practitioner

## 2022-03-31 ENCOUNTER — Inpatient Hospital Stay: Payer: BC Managed Care – PPO | Attending: Hematology & Oncology

## 2022-03-31 ENCOUNTER — Inpatient Hospital Stay: Payer: BC Managed Care – PPO | Admitting: Nurse Practitioner

## 2022-03-31 VITALS — BP 115/70 | HR 85 | Temp 98.1°F | Resp 18 | Ht 66.0 in | Wt 136.2 lb

## 2022-03-31 DIAGNOSIS — Z5111 Encounter for antineoplastic chemotherapy: Secondary | ICD-10-CM | POA: Diagnosis present

## 2022-03-31 DIAGNOSIS — Z452 Encounter for adjustment and management of vascular access device: Secondary | ICD-10-CM | POA: Diagnosis not present

## 2022-03-31 DIAGNOSIS — R1030 Lower abdominal pain, unspecified: Secondary | ICD-10-CM | POA: Insufficient documentation

## 2022-03-31 DIAGNOSIS — T451X5A Adverse effect of antineoplastic and immunosuppressive drugs, initial encounter: Secondary | ICD-10-CM | POA: Insufficient documentation

## 2022-03-31 DIAGNOSIS — C20 Malignant neoplasm of rectum: Secondary | ICD-10-CM

## 2022-03-31 DIAGNOSIS — D701 Agranulocytosis secondary to cancer chemotherapy: Secondary | ICD-10-CM | POA: Insufficient documentation

## 2022-03-31 DIAGNOSIS — E7800 Pure hypercholesterolemia, unspecified: Secondary | ICD-10-CM | POA: Insufficient documentation

## 2022-03-31 DIAGNOSIS — K625 Hemorrhage of anus and rectum: Secondary | ICD-10-CM | POA: Insufficient documentation

## 2022-03-31 DIAGNOSIS — Z933 Colostomy status: Secondary | ICD-10-CM | POA: Diagnosis not present

## 2022-03-31 LAB — CMP (CANCER CENTER ONLY)
ALT: 13 U/L (ref 0–44)
AST: 17 U/L (ref 15–41)
Albumin: 3.7 g/dL (ref 3.5–5.0)
Alkaline Phosphatase: 118 U/L (ref 38–126)
Anion gap: 10 (ref 5–15)
BUN: 6 mg/dL — ABNORMAL LOW (ref 8–23)
CO2: 30 mmol/L (ref 22–32)
Calcium: 9.5 mg/dL (ref 8.9–10.3)
Chloride: 102 mmol/L (ref 98–111)
Creatinine: 0.61 mg/dL (ref 0.44–1.00)
GFR, Estimated: >60 mL/min (ref >60)
Glucose, Bld: 85 mg/dL (ref 70–99)
Potassium: 4.1 mmol/L (ref 3.5–5.1)
Sodium: 142 mmol/L (ref 135–145)
Total Bilirubin: 0.5 mg/dL (ref 0.3–1.2)
Total Protein: 6 g/dL — ABNORMAL LOW (ref 6.5–8.1)

## 2022-03-31 LAB — CBC WITH DIFFERENTIAL (CANCER CENTER ONLY)
Abs Immature Granulocytes: 0.02 10*3/uL (ref 0.00–0.07)
Basophils Absolute: 0.1 10*3/uL (ref 0.0–0.1)
Basophils Relative: 1 %
Eosinophils Absolute: 0.2 10*3/uL (ref 0.0–0.5)
Eosinophils Relative: 2 %
HCT: 31.6 % — ABNORMAL LOW (ref 36.0–46.0)
Hemoglobin: 10.2 g/dL — ABNORMAL LOW (ref 12.0–15.0)
Immature Granulocytes: 0 %
Lymphocytes Relative: 18 %
Lymphs Abs: 1.3 10*3/uL (ref 0.7–4.0)
MCH: 29.8 pg (ref 26.0–34.0)
MCHC: 32.3 g/dL (ref 30.0–36.0)
MCV: 92.4 fL (ref 80.0–100.0)
Monocytes Absolute: 0.5 10*3/uL (ref 0.1–1.0)
Monocytes Relative: 7 %
Neutro Abs: 4.8 10*3/uL (ref 1.7–7.7)
Neutrophils Relative %: 72 %
Platelet Count: 247 10*3/uL (ref 150–400)
RBC: 3.42 MIL/uL — ABNORMAL LOW (ref 3.87–5.11)
RDW: 14 % (ref 11.5–15.5)
WBC Count: 6.8 10*3/uL (ref 4.0–10.5)
nRBC: 0 % (ref 0.0–0.2)

## 2022-03-31 MED ORDER — DEXTROSE 5 % IV SOLN: Freq: Once | INTRAVENOUS | Status: AC

## 2022-03-31 MED ORDER — OXALIPLATIN CHEMO INJECTION 100 MG/20ML
85.0000 mg/m2 | Freq: Once | INTRAVENOUS | Status: AC
  Administered 2022-03-31: 150 mg via INTRAVENOUS
  Filled 2022-03-31: qty 20

## 2022-03-31 MED ORDER — SODIUM CHLORIDE 0.9 % IV SOLN
10.0000 mg | Freq: Once | INTRAVENOUS | Status: AC
  Administered 2022-03-31: 10 mg via INTRAVENOUS
  Filled 2022-03-31: qty 10

## 2022-03-31 MED ORDER — SODIUM CHLORIDE 0.9 % IV SOLN
2400.0000 mg/m2 | INTRAVENOUS | Status: DC
  Administered 2022-03-31: 4200 mg via INTRAVENOUS
  Filled 2022-03-31: qty 84

## 2022-03-31 MED ORDER — FLUOROURACIL CHEMO INJECTION 2.5 GM/50ML
400.0000 mg/m2 | Freq: Once | INTRAVENOUS | Status: AC
  Administered 2022-03-31: 700 mg via INTRAVENOUS
  Filled 2022-03-31: qty 14

## 2022-03-31 MED ORDER — PALONOSETRON HCL INJECTION 0.25 MG/5ML
0.2500 mg | Freq: Once | INTRAVENOUS | Status: AC
  Administered 2022-03-31: 0.25 mg via INTRAVENOUS
  Filled 2022-03-31: qty 5

## 2022-03-31 MED ORDER — LEUCOVORIN CALCIUM INJECTION 350 MG
400.0000 mg/m2 | Freq: Once | INTRAVENOUS | Status: AC
  Administered 2022-03-31: 696 mg via INTRAVENOUS
  Filled 2022-03-31: qty 25

## 2022-03-31 NOTE — Patient Instructions (Signed)
[facility]   Discharge Instructions: Thank you for choosing [facility] to provide your oncology and hematology care.   If you have a lab appointment with the [facility], please go directly to the [facility] and check in at the registration area.   Wear comfortable clothing and clothing appropriate for easy access to any Portacath or PICC line.   We strive to give you quality time with your provider. You may need to reschedule your appointment if you arrive late (15 or more minutes).  Arriving late affects you and other patients whose appointments are after yours.  Also, if you miss three or more appointments without notifying the office, you may be dismissed from the clinic at the provider's discretion.      For prescription refill requests, have your pharmacy contact our office and allow 72 hours for refills to be completed.    Today you received the following chemotherapy and/or immunotherapy agents Oxaliplatin (ELOXATIN), Leucovorin & Flourouracil (ADRUCIL).      To help prevent nausea and vomiting after your treatment, we encourage you to take your nausea medication as directed.  BELOW ARE SYMPTOMS THAT SHOULD BE REPORTED IMMEDIATELY: *FEVER GREATER THAN 100.4 F (38 C) OR HIGHER *CHILLS OR SWEATING *NAUSEA AND VOMITING THAT IS NOT CONTROLLED WITH YOUR NAUSEA MEDICATION *UNUSUAL SHORTNESS OF BREATH *UNUSUAL BRUISING OR BLEEDING *URINARY PROBLEMS (pain or burning when urinating, or frequent urination) *BOWEL PROBLEMS (unusual diarrhea, constipation, pain near the anus) TENDERNESS IN MOUTH AND THROAT WITH OR WITHOUT PRESENCE OF ULCERS (sore throat, sores in mouth, or a toothache) UNUSUAL RASH, SWELLING OR PAIN  UNUSUAL VAGINAL DISCHARGE OR ITCHING   Items with * indicate a potential emergency and should be followed up as soon as possible or go to the Emergency Department if any problems should occur.  Please show the CHEMOTHERAPY ALERT  CARD or IMMUNOTHERAPY ALERT CARD at check-in to the Emergency Department and triage nurse.  Should you have questions after your visit or need to cancel or reschedule your appointment, please contact [facility]  Dept: [phone]  and follow the prompts.  Office hours are 8:00 a.m. to 4:30 p.m. Monday - Friday. Please note that voicemails left after 4:00 p.m. may not be returned until the following business day.  We are closed weekends and major holidays. You have access to a nurse at all times for urgent questions. Please call the main number to the clinic Dept: [phone] and follow the prompts.   For any non-urgent questions, you may also contact your provider using MyChart. We now offer e-Visits for anyone 77 and older to request care online for non-urgent symptoms. For details visit [email or web address].   Also download the MyChart app! Go to the app store, search "MyChart", open the app, select [facility], and log in with your MyChart username and password.  Masks are optional in the cancer centers. If you would like for your care team to wear a mask while they are taking care of you, please let them know. For doctor visits, patients may have with them one support person who is at least 62 years old. At this time, visitors are not allowed in the infusion area.  Oxaliplatin Injection What is this medication? OXALIPLATIN (ox AL i PLA tin) is a chemotherapy drug. It targets fast dividing cells, like cancer cells, and causes these cells to die. This medicine is used to treat cancers of the colon and rectum, and many  other cancers. This medicine may be used for other purposes; ask your health care provider or pharmacist if you have questions. COMMON BRAND NAME(S): Eloxatin What should I tell my care team before I take this medication? They need to know if you have any of these conditions: heart disease history of irregular heartbeat liver disease low blood  counts, like white cells, platelets, or red blood cells lung or breathing disease, like asthma take medicines that treat or prevent blood clots tingling of the fingers or toes, or other nerve disorder an unusual or allergic reaction to oxaliplatin, other chemotherapy, other medicines, foods, dyes, or preservatives pregnant or trying to get pregnant breast-feeding How should I use this medication? This drug is given as an infusion into a vein. It is administered in a hospital or clinic by a specially trained health care professional. Talk to your pediatrician regarding the use of this medicine in children. Special care may be needed. Overdosage: If you think you have taken too much of this medicine contact a poison control center or emergency room at once. NOTE: This medicine is only for you. Do not share this medicine with others. What if I miss a dose? It is important not to miss a dose. Call your doctor or health care professional if you are unable to keep an appointment. What may interact with this medication? Do not take this medicine with any of the following medications: cisapride dronedarone pimozide thioridazine This medicine may also interact with the following medications: aspirin and aspirin-like medicines certain medicines that treat or prevent blood clots like warfarin, apixaban, dabigatran, and rivaroxaban cisplatin cyclosporine diuretics medicines for infection like acyclovir, adefovir, amphotericin B, bacitracin, cidofovir, foscarnet, ganciclovir, gentamicin, pentamidine, vancomycin NSAIDs, medicines for pain and inflammation, like ibuprofen or naproxen other medicines that prolong the QT interval (an abnormal heart rhythm) pamidronate zoledronic acid This list may not describe all possible interactions. Give your health care provider a list of all the medicines, herbs, non-prescription drugs, or dietary supplements you use. Also tell them if you smoke, drink alcohol,  or use illegal drugs. Some items may interact with your medicine. What should I watch for while using this medication? Your condition will be monitored carefully while you are receiving this medicine. You may need blood work done while you are taking this medicine. This medicine may make you feel generally unwell. This is not uncommon as chemotherapy can affect healthy cells as well as cancer cells. Report any side effects. Continue your course of treatment even though you feel ill unless your healthcare professional tells you to stop. This medicine can make you more sensitive to cold. Do not drink cold drinks or use ice. Cover exposed skin before coming in contact with cold temperatures or cold objects. When out in cold weather wear warm clothing and cover your mouth and nose to warm the air that goes into your lungs. Tell your doctor if you get sensitive to the cold. Do not become pregnant while taking this medicine or for 9 months after stopping it. Women should inform their health care professional if they wish to become pregnant or think they might be pregnant. Men should not father a child while taking this medicine and for 6 months after stopping it. There is potential for serious side effects to an unborn child. Talk to your health care professional for more information. Do not breast-feed a child while taking this medicine or for 3 months after stopping it. This medicine has caused ovarian failure in some  women. This medicine may make it more difficult to get pregnant. Talk to your health care professional if you are concerned about your fertility. This medicine has caused decreased sperm counts in some men. This may make it more difficult to father a child. Talk to your health care professional if you are concerned about your fertility. This medicine may increase your risk of getting an infection. Call your health care professional for advice if you get a fever, chills, or sore throat, or other  symptoms of a cold or flu. Do not treat yourself. Try to avoid being around people who are sick. Avoid taking medicines that contain aspirin, acetaminophen, ibuprofen, naproxen, or ketoprofen unless instructed by your health care professional. These medicines may hide a fever. Be careful brushing or flossing your teeth or using a toothpick because you may get an infection or bleed more easily. If you have any dental work done, tell your dentist you are receiving this medicine. What side effects may I notice from receiving this medication? Side effects that you should report to your doctor or health care professional as soon as possible: allergic reactions like skin rash, itching or hives, swelling of the face, lips, or tongue breathing problems cough low blood counts - this medicine may decrease the number of white blood cells, red blood cells, and platelets. You may be at increased risk for infections and bleeding nausea, vomiting pain, redness, or irritation at site where injected pain, tingling, numbness in the hands or feet signs and symptoms of bleeding such as bloody or black, tarry stools; red or dark brown urine; spitting up blood or brown material that looks like coffee grounds; red spots on the skin; unusual bruising or bleeding from the eyes, gums, or nose signs and symptoms of a dangerous change in heartbeat or heart rhythm like chest pain; dizziness; fast, irregular heartbeat; palpitations; feeling faint or lightheaded; falls signs and symptoms of infection like fever; chills; cough; sore throat; pain or trouble passing urine signs and symptoms of liver injury like dark yellow or brown urine; general ill feeling or flu-like symptoms; light-colored stools; loss of appetite; nausea; right upper belly pain; unusually weak or tired; yellowing of the eyes or skin signs and symptoms of low red blood cells or anemia such as unusually weak or tired; feeling faint or lightheaded; falls signs and  symptoms of muscle injury like dark urine; trouble passing urine or change in the amount of urine; unusually weak or tired; muscle pain; back pain Side effects that usually do not require medical attention (report to your doctor or health care professional if they continue or are bothersome): changes in taste diarrhea gas hair loss loss of appetite mouth sores This list may not describe all possible side effects. Call your doctor for medical advice about side effects. You may report side effects to FDA at 1-800-FDA-1088. Where should I keep my medication? This drug is given in a hospital or clinic and will not be stored at home. NOTE: This sheet is a summary. It may not cover all possible information. If you have questions about this medicine, talk to your doctor, pharmacist, or health care provider.  2023 Elsevier/Gold Standard (2021-09-05 00:00:00)  Leucovorin injection What is this medication? LEUCOVORIN (loo koe VOR in) is used to prevent or treat the harmful effects of some medicines. This medicine is used to treat anemia caused by a low amount of folic acid in the body. It is also used with 5-fluorouracil (5-FU) to treat colon cancer.  This medicine may be used for other purposes; ask your health care provider or pharmacist if you have questions. What should I tell my care team before I take this medication? They need to know if you have any of these conditions: anemia from low levels of vitamin B-12 in the blood an unusual or allergic reaction to leucovorin, folic acid, other medicines, foods, dyes, or preservatives pregnant or trying to get pregnant breast-feeding How should I use this medication? This medicine is for injection into a muscle or into a vein. It is given by a health care professional in a hospital or clinic setting. Talk to your pediatrician regarding the use of this medicine in children. Special care may be needed. Overdosage: If you think you have taken too much of  this medicine contact a poison control center or emergency room at once. NOTE: This medicine is only for you. Do not share this medicine with others. What if I miss a dose? This does not apply. What may interact with this medication? capecitabine fluorouracil phenobarbital phenytoin primidone trimethoprim-sulfamethoxazole This list may not describe all possible interactions. Give your health care provider a list of all the medicines, herbs, non-prescription drugs, or dietary supplements you use. Also tell them if you smoke, drink alcohol, or use illegal drugs. Some items may interact with your medicine. What should I watch for while using this medication? Your condition will be monitored carefully while you are receiving this medicine. This medicine may increase the side effects of 5-fluorouracil, 5-FU. Tell your doctor or health care professional if you have diarrhea or mouth sores that do not get better or that get worse. What side effects may I notice from receiving this medication? Side effects that you should report to your doctor or health care professional as soon as possible: allergic reactions like skin rash, itching or hives, swelling of the face, lips, or tongue breathing problems fever, infection mouth sores unusual bleeding or bruising unusually weak or tired Side effects that usually do not require medical attention (report to your doctor or health care professional if they continue or are bothersome): constipation or diarrhea loss of appetite nausea, vomiting This list may not describe all possible side effects. Call your doctor for medical advice about side effects. You may report side effects to FDA at 1-800-FDA-1088. Where should I keep my medication? This drug is given in a hospital or clinic and will not be stored at home. NOTE: This sheet is a summary. It may not cover all possible information. If you have questions about this medicine, talk to your doctor,  pharmacist, or health care provider.  2023 Elsevier/Gold Standard (2008-04-12 00:00:00) Fluorouracil, 5-FU injection What is this medication? FLUOROURACIL, 5-FU (flure oh YOOR a sil) is a chemotherapy drug. It slows the growth of cancer cells. This medicine is used to treat many types of cancer like breast cancer, colon or rectal cancer, pancreatic cancer, and stomach cancer. This medicine may be used for other purposes; ask your health care provider or pharmacist if you have questions. COMMON BRAND NAME(S): Adrucil What should I tell my care team before I take this medication? They need to know if you have any of these conditions: blood disorders dihydropyrimidine dehydrogenase (DPD) deficiency infection (especially a virus infection such as chickenpox, cold sores, or herpes) kidney disease liver disease malnourished, poor nutrition recent or ongoing radiation therapy an unusual or allergic reaction to fluorouracil, other chemotherapy, other medicines, foods, dyes, or preservatives pregnant or trying to get pregnant breast-feeding How  should I use this medication? This drug is given as an infusion or injection into a vein. It is administered in a hospital or clinic by a specially trained health care professional. Talk to your pediatrician regarding the use of this medicine in children. Special care may be needed. Overdosage: If you think you have taken too much of this medicine contact a poison control center or emergency room at once. NOTE: This medicine is only for you. Do not share this medicine with others. What if I miss a dose? It is important not to miss your dose. Call your doctor or health care professional if you are unable to keep an appointment. What may interact with this medication? Do not take this medicine with any of the following medications: live virus vaccines This medicine may also interact with the following medications: medicines that treat or prevent blood  clots like warfarin, enoxaparin, and dalteparin This list may not describe all possible interactions. Give your health care provider a list of all the medicines, herbs, non-prescription drugs, or dietary supplements you use. Also tell them if you smoke, drink alcohol, or use illegal drugs. Some items may interact with your medicine. What should I watch for while using this medication? Visit your doctor for checks on your progress. This drug may make you feel generally unwell. This is not uncommon, as chemotherapy can affect healthy cells as well as cancer cells. Report any side effects. Continue your course of treatment even though you feel ill unless your doctor tells you to stop. In some cases, you may be given additional medicines to help with side effects. Follow all directions for their use. Call your doctor or health care professional for advice if you get a fever, chills or sore throat, or other symptoms of a cold or flu. Do not treat yourself. This drug decreases your body's ability to fight infections. Try to avoid being around people who are sick. This medicine may increase your risk to bruise or bleed. Call your doctor or health care professional if you notice any unusual bleeding. Be careful brushing and flossing your teeth or using a toothpick because you may get an infection or bleed more easily. If you have any dental work done, tell your dentist you are receiving this medicine. Avoid taking products that contain aspirin, acetaminophen, ibuprofen, naproxen, or ketoprofen unless instructed by your doctor. These medicines may hide a fever. Do not become pregnant while taking this medicine. Women should inform their doctor if they wish to become pregnant or think they might be pregnant. There is a potential for serious side effects to an unborn child. Talk to your health care professional or pharmacist for more information. Do not breast-feed an infant while taking this medicine. Men should  inform their doctor if they wish to father a child. This medicine may lower sperm counts. Do not treat diarrhea with over the counter products. Contact your doctor if you have diarrhea that lasts more than 2 days or if it is severe and watery. This medicine can make you more sensitive to the sun. Keep out of the sun. If you cannot avoid being in the sun, wear protective clothing and use sunscreen. Do not use sun lamps or tanning beds/booths. What side effects may I notice from receiving this medication? Side effects that you should report to your doctor or health care professional as soon as possible: allergic reactions like skin rash, itching or hives, swelling of the face, lips, or tongue low blood counts -  this medicine may decrease the number of white blood cells, red blood cells and platelets. You may be at increased risk for infections and bleeding. signs of infection - fever or chills, cough, sore throat, pain or difficulty passing urine signs of decreased platelets or bleeding - bruising, pinpoint red spots on the skin, black, tarry stools, blood in the urine signs of decreased red blood cells - unusually weak or tired, fainting spells, lightheadedness breathing problems changes in vision chest pain mouth sores nausea and vomiting pain, swelling, redness at site where injected pain, tingling, numbness in the hands or feet redness, swelling, or sores on hands or feet stomach pain unusual bleeding Side effects that usually do not require medical attention (report to your doctor or health care professional if they continue or are bothersome): changes in finger or toe nails diarrhea dry or itchy skin hair loss headache loss of appetite sensitivity of eyes to the light stomach upset unusually teary eyes This list may not describe all possible side effects. Call your doctor for medical advice about side effects. You may report side effects to FDA at 1-800-FDA-1088. Where should I  keep my medication? This drug is given in a hospital or clinic and will not be stored at home. NOTE: This sheet is a summary. It may not cover all possible information. If you have questions about this medicine, talk to your doctor, pharmacist, or health care provider.  2023 Elsevier/Gold Standard (2021-09-05 00:00:00)  The chemotherapy medication bag should finish at 46 hours, 96 hours, or 7 days. For example, if your pump is scheduled for 46 hours and it was put on at 4:00 p.m., it should finish at 2:00 p.m. the day it is scheduled to come off regardless of your appointment time.     Estimated time to finish at 11:40 a.m. on Thursday 04/02/2022.   If the display on your pump reads "Low Volume" and it is beeping, take the batteries out of the pump and come to the cancer center for it to be taken off.   If the pump alarms go off prior to the pump reading "Low Volume" then call [phone] and someone can assist you.  If the plunger comes out and the chemotherapy medication is leaking out, please use your home chemo spill kit to clean up the spill. Do NOT use paper towels or other household products.  If you have problems or questions regarding your pump, please call either [phone] (24 hours a day) or the cancer center Monday-Friday 8:00 a.m.- 4:30 p.m. at the clinic number and we will assist you. If you are unable to get assistance, then go to the nearest Emergency Department and ask the staff to contact the IV team for assistance.

## 2022-03-31 NOTE — Progress Notes (Signed)
[facility] OFFICE PROGRESS NOTE   Diagnosis: Rectal cancer  INTERVAL HISTORY:   Karen Kaufman returns as scheduled.  She completed cycle 1 FOLFOX 03/02/2022.  She was hospitalized 03/11/2022 through 03/20/2022 with obstructive symptoms.  She underwent surgery with a diverting colostomy 03/12/2022.  She is seen today prior to resuming chemotherapy.    Colostomy is functioning well.  She is providing the care.  She has a good appetite.  She is gaining weight.  Main pain is in her back.  She takes pain medication as needed.  She had mild nausea after the first chemotherapy.  No vomiting.  No mouth sores.  No diarrhea.  Objective:  Vital signs in last 24 hours:  Blood pressure 115/70, pulse 85, temperature 98.1 F (36.7 C), temperature source Oral, resp. rate 18, height $RemoveBe'5\' 6"'nJhTqtsHj$  (1.676 m), weight 136 lb 3.2 oz (61.8 kg), SpO2 100 %.    HEENT: No thrush or ulcers. Resp: Lungs clear bilaterally. Cardio: Regular rate and rhythm. GI: Abdomen soft and nontender.  No hepatomegaly.  Left lower quadrant colostomy. Vascular: No leg edema. Skin: Palms without erythema. Port-A-Cath without erythema.   Lab Results:  Lab Results  Component Value Date   WBC 6.8 03/31/2022   HGB 10.2 (L) 03/31/2022   HCT 31.6 (L) 03/31/2022   MCV 92.4 03/31/2022   PLT 247 03/31/2022   NEUTROABS 4.8 03/31/2022    Imaging:  No results found.  Medications: I have reviewed the patient's current medications.  Assessment/Plan: Colorectal cancer 02/08/2022 CT abdomen/pelvis without contrast-severe circumferential wall thickening of the distal sigmoid colon and rectum; enlarged perirectal and low left retroperitoneal lymph nodes; multiple ill-defined hypodense liver lesions; trace ascites in the low pelvis.  02/08/2022 Colonoscopy by Dr. Charlyne Mom almost completely obstructing medium-sized mass in the rectosigmoid colon measuring from 12 to 17 cm.  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  Foundation 1-microsatellite stable, tumor mutation burden 5, BRAFV600E, K-ras wild-type 02/09/2022 CEA 3.2 CT chest 02/18/2022-chest adenopathy.  Multiple hypodense liver lesions. 02/20/2022 biopsy of liver lesion-fragments of benign liver parenchyma with reactive changes.  No evidence of malignancy. MRI abdomen 03/01/2022-numerous hypovascular hepatic lesions having imaging characteristics consider diagnostic of widespread metastatic disease to the liver.  Retroperitoneal lymphadenopathy concerning for metastatic disease. Cycle 1 FOLFOX 03/02/2022 03/11/2022 CTs abdomen/pelvis-distal large bowel obstruction at the site of a large annular mass at the rectosigmoid junction.  Progressive liver metastases. Compared to 4/23 CT. Progressive perirectal, left periaortic and right pericardiophrenic nodal metastases.  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 Cycle 2 FOLFOX 03/31/2022 Rectal bleeding, lower abdominal pain, constipation secondary to #1 Multiple family members with cancer Hypercholesterolemia Port-A-Cath placement 02/20/2022 Hospital admission 03/11/2022-colonic obstruction secondary to colon cancer. Neutropenia secondary to chemotherapy    Disposition: Karen Kaufman appears stable.  She has completed 1 cycle of FOLFOX.  She was subsequently admitted with a bowel obstruction and underwent surgery, now has a diverting colostomy.  Plan to proceed with cycle 2 FOLFOX today as scheduled.  Addition of irinotecan will be considered with the next cycle of chemotherapy pending tolerance.  Potential toxicities reviewed including bone marrow toxicity, nausea, hair loss, diarrhea.  CBC and chemistry panel from today reviewed.  Labs are adequate to proceed as above.  She will return for lab, follow-up, chemotherapy in 2 weeks.  She will contact the office in the interim with any problems.  Patient seen with Dr. Benay Spice.    Ned Card ANP/GNP-BC   03/31/2022   9:08 AM  This was a shared visit with Ned Card.  Karen Kaufman has recovered from the diverting colostomy procedure.  She will complete cycle 2 Folfox today.  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  We will consider adding irinotecan or panitumumab within the next few cycles of treatment.  I was present for greater than 50% of today's visit.  I performed medical decision making.  Julieanne Manson, MD

## 2022-03-31 NOTE — Progress Notes (Signed)
Patient seen by Lisa Thomas NP today  Vitals are within treatment parameters.  Labs reviewed by Lisa Thomas NP and are within treatment parameters.  Per physician team, patient is ready for treatment and there are NO modifications to the treatment plan.     

## 2022-04-01 ENCOUNTER — Inpatient Hospital Stay: Payer: BC Managed Care – PPO

## 2022-04-02 ENCOUNTER — Inpatient Hospital Stay: Payer: BC Managed Care – PPO

## 2022-04-02 VITALS — BP 109/72 | HR 87 | Temp 98.1°F | Resp 18

## 2022-04-02 DIAGNOSIS — C20 Malignant neoplasm of rectum: Secondary | ICD-10-CM

## 2022-04-02 DIAGNOSIS — Z5111 Encounter for antineoplastic chemotherapy: Secondary | ICD-10-CM | POA: Diagnosis not present

## 2022-04-02 MED ORDER — SODIUM CHLORIDE 0.9% FLUSH
10.0000 mL | INTRAVENOUS | Status: DC | PRN
  Administered 2022-04-02: 10 mL

## 2022-04-02 MED ORDER — HEPARIN SOD (PORK) LOCK FLUSH 100 UNIT/ML IV SOLN
500.0000 [IU] | Freq: Once | INTRAVENOUS | Status: AC | PRN
  Administered 2022-04-02: 500 [IU]

## 2022-04-02 NOTE — Patient Instructions (Signed)

## 2022-04-06 DIAGNOSIS — Z933 Colostomy status: Secondary | ICD-10-CM | POA: Insufficient documentation

## 2022-04-12 ENCOUNTER — Other Ambulatory Visit: Payer: Self-pay | Admitting: Oncology

## 2022-04-13 ENCOUNTER — Encounter: Payer: Self-pay | Admitting: Nurse Practitioner

## 2022-04-13 ENCOUNTER — Encounter: Payer: Self-pay | Admitting: *Deleted

## 2022-04-13 ENCOUNTER — Inpatient Hospital Stay: Payer: BC Managed Care – PPO

## 2022-04-13 ENCOUNTER — Inpatient Hospital Stay (HOSPITAL_BASED_OUTPATIENT_CLINIC_OR_DEPARTMENT_OTHER): Payer: BC Managed Care – PPO | Admitting: Nurse Practitioner

## 2022-04-13 VITALS — BP 120/70 | HR 80 | Temp 97.9°F | Resp 18 | Ht 66.0 in | Wt 135.8 lb

## 2022-04-13 VITALS — BP 111/66 | HR 73 | Resp 18

## 2022-04-13 DIAGNOSIS — Z5111 Encounter for antineoplastic chemotherapy: Secondary | ICD-10-CM | POA: Diagnosis not present

## 2022-04-13 DIAGNOSIS — C20 Malignant neoplasm of rectum: Secondary | ICD-10-CM

## 2022-04-13 LAB — CMP (CANCER CENTER ONLY)
ALT: 10 U/L (ref 0–44)
AST: 15 U/L (ref 15–41)
Albumin: 3.7 g/dL (ref 3.5–5.0)
Alkaline Phosphatase: 133 U/L — ABNORMAL HIGH (ref 38–126)
Anion gap: 11 (ref 5–15)
BUN: <5 mg/dL — ABNORMAL LOW (ref 8–23)
CO2: 29 mmol/L (ref 22–32)
Calcium: 9.2 mg/dL (ref 8.9–10.3)
Chloride: 104 mmol/L (ref 98–111)
Creatinine: 0.6 mg/dL (ref 0.44–1.00)
GFR, Estimated: >60 mL/min (ref >60)
Glucose, Bld: 102 mg/dL — ABNORMAL HIGH (ref 70–99)
Potassium: 3.4 mmol/L — ABNORMAL LOW (ref 3.5–5.1)
Sodium: 144 mmol/L (ref 135–145)
Total Bilirubin: 0.5 mg/dL (ref 0.3–1.2)
Total Protein: 5.9 g/dL — ABNORMAL LOW (ref 6.5–8.1)

## 2022-04-13 LAB — CBC WITH DIFFERENTIAL (CANCER CENTER ONLY)
Abs Immature Granulocytes: 0.01 10*3/uL (ref 0.00–0.07)
Basophils Absolute: 0 10*3/uL (ref 0.0–0.1)
Basophils Relative: 1 %
Eosinophils Absolute: 0.5 10*3/uL (ref 0.0–0.5)
Eosinophils Relative: 10 %
HCT: 30.2 % — ABNORMAL LOW (ref 36.0–46.0)
Hemoglobin: 9.8 g/dL — ABNORMAL LOW (ref 12.0–15.0)
Immature Granulocytes: 0 %
Lymphocytes Relative: 21 %
Lymphs Abs: 1 10*3/uL (ref 0.7–4.0)
MCH: 30.2 pg (ref 26.0–34.0)
MCHC: 32.5 g/dL (ref 30.0–36.0)
MCV: 92.9 fL (ref 80.0–100.0)
Monocytes Absolute: 0.3 10*3/uL (ref 0.1–1.0)
Monocytes Relative: 7 %
Neutro Abs: 2.9 10*3/uL (ref 1.7–7.7)
Neutrophils Relative %: 61 %
Platelet Count: 154 10*3/uL (ref 150–400)
RBC: 3.25 MIL/uL — ABNORMAL LOW (ref 3.87–5.11)
RDW: 14.2 % (ref 11.5–15.5)
WBC Count: 4.7 10*3/uL (ref 4.0–10.5)
nRBC: 0 % (ref 0.0–0.2)

## 2022-04-13 MED ORDER — HYDROCODONE-ACETAMINOPHEN 5-325 MG PO TABS: 1.0000 | ORAL_TABLET | Freq: Four times a day (QID) | ORAL | 0 refills | Status: DC | PRN

## 2022-04-13 MED ORDER — SODIUM CHLORIDE 0.9 % IV SOLN
10.0000 mg | Freq: Once | INTRAVENOUS | Status: AC
  Administered 2022-04-13: 10 mg via INTRAVENOUS
  Filled 2022-04-13: qty 1

## 2022-04-13 MED ORDER — OXALIPLATIN CHEMO INJECTION 100 MG/20ML
85.0000 mg/m2 | Freq: Once | INTRAVENOUS | Status: AC
  Administered 2022-04-13: 150 mg via INTRAVENOUS
  Filled 2022-04-13: qty 20

## 2022-04-13 MED ORDER — LEUCOVORIN CALCIUM INJECTION 350 MG
400.0000 mg/m2 | Freq: Once | INTRAVENOUS | Status: AC
  Administered 2022-04-13: 696 mg via INTRAVENOUS
  Filled 2022-04-13: qty 34.8

## 2022-04-13 MED ORDER — SODIUM CHLORIDE 0.9 % IV SOLN
2400.0000 mg/m2 | INTRAVENOUS | Status: DC
  Administered 2022-04-13: 4200 mg via INTRAVENOUS
  Filled 2022-04-13: qty 84

## 2022-04-13 MED ORDER — FLUOROURACIL CHEMO INJECTION 2.5 GM/50ML
400.0000 mg/m2 | Freq: Once | INTRAVENOUS | Status: AC
  Administered 2022-04-13: 700 mg via INTRAVENOUS
  Filled 2022-04-13: qty 14

## 2022-04-13 MED ORDER — DEXTROSE 5 % IV SOLN: Freq: Once | INTRAVENOUS | Status: AC

## 2022-04-13 MED ORDER — PALONOSETRON HCL INJECTION 0.25 MG/5ML
0.2500 mg | Freq: Once | INTRAVENOUS | Status: AC
  Administered 2022-04-13: 0.25 mg via INTRAVENOUS
  Filled 2022-04-13: qty 5

## 2022-04-14 ENCOUNTER — Telehealth: Payer: Self-pay

## 2022-04-14 ENCOUNTER — Other Ambulatory Visit: Payer: Self-pay

## 2022-04-14 DIAGNOSIS — C20 Malignant neoplasm of rectum: Secondary | ICD-10-CM

## 2022-04-15 ENCOUNTER — Inpatient Hospital Stay: Payer: BC Managed Care – PPO

## 2022-04-15 VITALS — BP 113/69 | HR 79 | Temp 98.1°F | Resp 16

## 2022-04-15 DIAGNOSIS — C20 Malignant neoplasm of rectum: Secondary | ICD-10-CM

## 2022-04-15 DIAGNOSIS — Z5111 Encounter for antineoplastic chemotherapy: Secondary | ICD-10-CM | POA: Diagnosis not present

## 2022-04-15 LAB — URINALYSIS, COMPLETE (UACMP) WITH MICROSCOPIC
Bilirubin Urine: NEGATIVE
Glucose, UA: NEGATIVE mg/dL
Hgb urine dipstick: NEGATIVE
Ketones, ur: NEGATIVE mg/dL
Leukocytes,Ua: NEGATIVE
Nitrite: NEGATIVE
Protein, ur: NEGATIVE mg/dL
Specific Gravity, Urine: 1.011 (ref 1.005–1.030)
pH: 5.5 (ref 5.0–8.0)

## 2022-04-15 MED ORDER — SODIUM CHLORIDE 0.9% FLUSH
10.0000 mL | INTRAVENOUS | Status: DC | PRN
  Administered 2022-04-15: 10 mL

## 2022-04-15 MED ORDER — HEPARIN SOD (PORK) LOCK FLUSH 100 UNIT/ML IV SOLN
500.0000 [IU] | Freq: Once | INTRAVENOUS | Status: AC | PRN
  Administered 2022-04-15: 500 [IU]

## 2022-04-16 ENCOUNTER — Telehealth: Payer: Self-pay

## 2022-04-16 NOTE — Telephone Encounter (Signed)
Per Ned Card, NP, pt informed that her recent urinalysis was negative. Pt verbalizes understanding and will call [place] with further questions/concerns

## 2022-04-16 NOTE — Telephone Encounter (Signed)
-----   Message from Owens Shark, NP sent at 04/15/2022  4:17 PM EDT ----- Please let her know the urinalysis is negative.

## 2022-04-24 ENCOUNTER — Encounter (HOSPITAL_COMMUNITY): Payer: Self-pay | Admitting: Nurse Practitioner

## 2022-04-25 ENCOUNTER — Other Ambulatory Visit: Payer: Self-pay | Admitting: Oncology

## 2022-04-28 ENCOUNTER — Inpatient Hospital Stay: Payer: BC Managed Care – PPO

## 2022-04-28 ENCOUNTER — Inpatient Hospital Stay: Payer: BC Managed Care – PPO | Attending: Oncology | Admitting: Oncology

## 2022-04-28 ENCOUNTER — Encounter: Payer: Self-pay | Admitting: *Deleted

## 2022-04-28 VITALS — BP 126/74 | HR 88 | Temp 97.8°F | Resp 18 | Ht 66.0 in | Wt 134.4 lb

## 2022-04-28 DIAGNOSIS — R1030 Lower abdominal pain, unspecified: Secondary | ICD-10-CM | POA: Diagnosis not present

## 2022-04-28 DIAGNOSIS — K625 Hemorrhage of anus and rectum: Secondary | ICD-10-CM | POA: Diagnosis not present

## 2022-04-28 DIAGNOSIS — C20 Malignant neoplasm of rectum: Secondary | ICD-10-CM | POA: Insufficient documentation

## 2022-04-28 DIAGNOSIS — E7800 Pure hypercholesterolemia, unspecified: Secondary | ICD-10-CM | POA: Insufficient documentation

## 2022-04-28 DIAGNOSIS — D701 Agranulocytosis secondary to cancer chemotherapy: Secondary | ICD-10-CM | POA: Insufficient documentation

## 2022-04-28 DIAGNOSIS — Z933 Colostomy status: Secondary | ICD-10-CM | POA: Insufficient documentation

## 2022-04-28 DIAGNOSIS — T451X5D Adverse effect of antineoplastic and immunosuppressive drugs, subsequent encounter: Secondary | ICD-10-CM | POA: Diagnosis not present

## 2022-04-28 DIAGNOSIS — Z95828 Presence of other vascular implants and grafts: Secondary | ICD-10-CM

## 2022-04-28 LAB — CBC WITH DIFFERENTIAL (CANCER CENTER ONLY)
Abs Immature Granulocytes: 0 10*3/uL (ref 0.00–0.07)
Basophils Absolute: 0 10*3/uL (ref 0.0–0.1)
Basophils Relative: 0 %
Eosinophils Absolute: 0 10*3/uL (ref 0.0–0.5)
Eosinophils Relative: 2 %
HCT: 31.5 % — ABNORMAL LOW (ref 36.0–46.0)
Hemoglobin: 10.4 g/dL — ABNORMAL LOW (ref 12.0–15.0)
Immature Granulocytes: 0 %
Lymphocytes Relative: 40 %
Lymphs Abs: 0.9 10*3/uL (ref 0.7–4.0)
MCH: 30 pg (ref 26.0–34.0)
MCHC: 33 g/dL (ref 30.0–36.0)
MCV: 90.8 fL (ref 80.0–100.0)
Monocytes Absolute: 0.5 10*3/uL (ref 0.1–1.0)
Monocytes Relative: 19 %
Neutro Abs: 0.9 10*3/uL — ABNORMAL LOW (ref 1.7–7.7)
Neutrophils Relative %: 39 %
Platelet Count: 79 10*3/uL — ABNORMAL LOW (ref 150–400)
RBC: 3.47 MIL/uL — ABNORMAL LOW (ref 3.87–5.11)
RDW: 14 % (ref 11.5–15.5)
WBC Count: 2.3 10*3/uL — ABNORMAL LOW (ref 4.0–10.5)
nRBC: 0 % (ref 0.0–0.2)

## 2022-04-28 LAB — CMP (CANCER CENTER ONLY)
ALT: 20 U/L (ref 0–44)
AST: 32 U/L (ref 15–41)
Albumin: 4.1 g/dL (ref 3.5–5.0)
Alkaline Phosphatase: 163 U/L — ABNORMAL HIGH (ref 38–126)
Anion gap: 12 (ref 5–15)
BUN: 7 mg/dL — ABNORMAL LOW (ref 8–23)
CO2: 28 mmol/L (ref 22–32)
Calcium: 9.7 mg/dL (ref 8.9–10.3)
Chloride: 104 mmol/L (ref 98–111)
Creatinine: 0.58 mg/dL (ref 0.44–1.00)
GFR, Estimated: >60 mL/min (ref >60)
Glucose, Bld: 115 mg/dL — ABNORMAL HIGH (ref 70–99)
Potassium: 3.8 mmol/L (ref 3.5–5.1)
Sodium: 144 mmol/L (ref 135–145)
Total Bilirubin: 0.6 mg/dL (ref 0.3–1.2)
Total Protein: 6.4 g/dL — ABNORMAL LOW (ref 6.5–8.1)

## 2022-04-28 MED ORDER — SODIUM CHLORIDE 0.9% FLUSH
10.0000 mL | INTRAVENOUS | Status: AC | PRN
  Administered 2022-04-28: 10 mL via INTRAVENOUS

## 2022-04-28 MED ORDER — HYDROCODONE-ACETAMINOPHEN 5-325 MG PO TABS: 1.0000 | ORAL_TABLET | ORAL | 0 refills | Status: DC | PRN

## 2022-04-28 MED ORDER — HEPARIN SOD (PORK) LOCK FLUSH 100 UNIT/ML IV SOLN
500.0000 [IU] | Freq: Once | INTRAVENOUS | Status: AC
  Administered 2022-04-28: 500 [IU] via INTRAVENOUS

## 2022-04-28 NOTE — Patient Instructions (Signed)
Neutropenia °Neutropenia is a condition that occurs when you have low levels of neutrophils. Neutrophils are a type of white blood cells. They are made in the spongy center of bones (bone marrow). They fight infections. °Neutrophils are your body's main defense against infections. The fewer neutrophils you have and the longer your body remains without them, the greater your risk of getting a severe infection. °What are the causes? °This condition can occur if your body uses up or destroys neutrophils faster than your bone marrow can make them. Neutropenia may be caused by: °A bacterial or fungal infection. °Allergic disorders. °Reactions to some medicines. °An autoimmune disease. °An enlarged spleen. °This condition can also occur if your bone marrow does not produce enough neutrophils. This problem may be caused by: °Cancer. °Cancer treatments, such as radiation or chemotherapy. °Viral infections. °Medicines, such as phenytoin. °Vitamin B12 deficiency. °Diseases of the bone marrow. °Environmental toxins, such as insecticides. °What are the signs or symptoms? °This condition does not usually cause symptoms. If symptoms are present, they are usually caused by an underlying infection. Symptoms of an infection may include: °Fever. °Chills. °Swollen glands. °Mouth ulcers. °Cough. °Rash or skin infection. Skin may be red, swollen, or painful. °Abdominal or rectal pain. °Frequent urination or pain or burning with urination. °Because neutropenia weakens the immune system, symptoms of infection may be reduced. It is important to be aware of any changes in your body and talk to your health care provider. °How is this diagnosed? °This condition is diagnosed based on your medical history and a physical exam. Tests will also be done, such as: °A complete blood count (CBC). °Bone marrow biopsy. This is collecting a sample of bone marrow for testing. °A chest X-ray. °A urine culture. °A blood culture. °How is this  treated? °Treatment depends on the underlying cause and severity of your condition. Mild neutropenia may not require treatment. Treatment may include medicines, such as: °Antibiotic medicine given through an IV. °Antiviral medicines. °Antifungal medicines. °A medicine to increase production of neutrophils (colony-stimulating factor). You may get this medicine through an IV or by injection. °Steroids given through an IV. °If an underlying condition is causing neutropenia, you may need treatment for that condition. If medicines or cancer treatments are causing neutropenia, your health care provider may have you stop the medicines or treatment. °Follow these instructions at home: °Medicines ° °Take over-the-counter and prescription medicines only as told by your health care provider. °Get an annual flu shot. Ask your health care provider whether you or anyone you live with needs any other vaccines. °Eating and drinking °Do not share food utensils. °Do not eat unpasteurized foods. °Do not eat raw or undercooked meat, eggs, or seafood. °Do not eat unwashed, raw fruits or vegetables. °Lifestyle °Avoid exposure to groups of people or children. °Avoid being around people who are sick. °Avoid being around live plants or fresh flowers. °Avoid being around dirt or dust, such as in construction areas or gardens. Wear gloves if you are going to do yard work or gardening. °Do not provide direct care for pets. Avoid animal droppings. Do not clean litter boxes and bird cages. °Do not have sex unless your health care provider has approved. °Hygiene ° °Bathe daily. °Clean the area between the genitals and the anus (perineal area) after you urinate or have a bowel movement. If you are female, wipe from front to back. °Get regular dental care and brush your teeth with a soft toothbrush before and after meals. °Do not use   a regular razor. Use an electric razor to remove hair. Wash your hands often with soap and water for at least 20  seconds. Make sure others who come in contact with you also wash their hands. If soap and water are not available, use hand sanitizer. General instructions Take steps to reduce your risk of injury or infection. Follow any precautions as told by your health care provider. Take actions to avoid cuts and burns. For example: Be cautious when you use knives. Always cut away from yourself. Keep knives in protective sheaths or guards when not in use. Use oven mitts when you cook with a hot stove, oven, or grill. Stand a safe distance away from open fires. Do not use tampons, enemas, or rectal suppositories unless your health care provider has approved. Keep all follow-up visits. This is important. Contact a health care provider if: You have a cough. You have a sore throat. You develop sores in your mouth or anus. You have a warm, red, or tender area on your skin. You have red streaks on the skin. You develop a rash. You have swollen lymph nodes. You have frequent or painful urination. You have vaginal discharge or itching. Get help right away if: You have a fever. You have chills or shaking. You have nausea or vomiting. You have a lot of fatigue. You have shortness of breath. Summary Neutropenia is a condition that occurs when you have a lower-than-normal level of a type of white blood cell (neutrophils) in your body. This condition can occur if your body uses up or destroys neutrophils faster than your bone marrow can make them. Treatment depends on the underlying cause and severity of your condition. Mild neutropenia may not require treatment. Follow any precautions as told by your health care provider to reduce your risk for injury or infection. This information is not intended to replace advice given to you by your health care provider. Make sure you discuss any questions you have with your health care provider. Document Revised: 04/02/2021 Document Reviewed: 04/02/2021 Elsevier Patient  Education  [place].

## 2022-04-28 NOTE — Addendum Note (Signed)
Addended by: Lenox Ponds E on: 04/28/2022 10:22 AM   Modules accepted: Orders

## 2022-04-28 NOTE — Patient Instructions (Signed)

## 2022-04-28 NOTE — Progress Notes (Signed)
[facility] OFFICE PROGRESS NOTE   Diagnosis: Rectal cancer  INTERVAL HISTORY:   Karen Kaufman complete another cycle of FOLFOX on 04/13/2022.  No nausea/vomiting, mouth sores, diarrhea, or peripheral numbness.  Rectal pain remains improved.  She has "constant "discomfort at the right lower back.  She reports weakness of the right leg.  No difficulty with bowel or bladder control.  Objective:  Vital signs in last 24 hours:  Blood pressure 126/74, pulse 88, temperature 97.8 F (36.6 C), temperature source Oral, resp. rate 18, height '5\' 6"'  (1.676 m), weight 134 lb 6.4 oz (61 kg), SpO2 99 %.    HEENT: No thrush or ulcers Resp: Lungs clear bilaterally Cardio: Regular rate and rhythm GI: No hepatosplenomegaly, left lower quadrant colostomy Vascular: No leg edema Neuro: 4/5 strength with flexion at the right hip and extension at the right knee, the deep tendon reflexes are intact at the knee bilaterally Musculoskeletal: The area of discomfort is at the level of the upper right iliac  Portacath/PICC-without erythema  Lab Results:  Lab Results  Component Value Date   WBC 2.3 (L) 04/28/2022   HGB 10.4 (L) 04/28/2022   HCT 31.5 (L) 04/28/2022   MCV 90.8 04/28/2022   PLT 79 (L) 04/28/2022   NEUTROABS 0.9 (L) 04/28/2022    CMP  Lab Results  Component Value Date   NA 144 04/28/2022   K 3.8 04/28/2022   CL 104 04/28/2022   CO2 28 04/28/2022   GLUCOSE 115 (H) 04/28/2022   BUN 7 (L) 04/28/2022   CREATININE 0.58 04/28/2022   CALCIUM 9.7 04/28/2022   PROT 6.4 (L) 04/28/2022   ALBUMIN 4.1 04/28/2022   AST 32 04/28/2022   ALT 20 04/28/2022   ALKPHOS 163 (H) 04/28/2022   BILITOT 0.6 04/28/2022   GFRNONAA >60 04/28/2022   GFRAA 92 11/28/2020    Lab Results  Component Value Date   CEA1 3.2 02/09/2022    Medications: I have reviewed the patient's current medications.   Assessment/Plan:  Colorectal cancer 02/08/2022 CT abdomen/pelvis without contrast-severe  circumferential wall thickening of the distal sigmoid colon and rectum; enlarged perirectal and low left retroperitoneal lymph nodes; multiple ill-defined hypodense liver lesions; trace ascites in the low pelvis.  02/08/2022 Colonoscopy by Dr. Charlyne Mom almost completely obstructing medium-sized mass in the rectosigmoid colon measuring from 12 to 17 cm.  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  Foundation 1-microsatellite stable, tumor mutation burden 5, BRAFV600E, K-ras wild-type 02/09/2022 CEA 3.2 CT chest 02/18/2022-chest adenopathy.  Multiple hypodense liver lesions. 02/20/2022 biopsy of liver lesion-fragments of benign liver parenchyma with reactive changes.  No evidence of malignancy. MRI abdomen 03/01/2022-numerous hypovascular hepatic lesions having imaging characteristics consider diagnostic of widespread metastatic disease to the liver.  Retroperitoneal lymphadenopathy concerning for metastatic disease. Cycle 1 FOLFOX 03/02/2022 03/11/2022 CTs abdomen/pelvis-distal large bowel obstruction at the site of a large annular mass at the rectosigmoid junction.  Progressive liver metastases. Compared to 4/23 CT. Progressive perirectal, left periaortic and right pericardiophrenic nodal metastases.  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 Cycle 2 FOLFOX 03/31/2022 Cycle 3 FOLFOX 04/13/2022 Cycle 4 FOLFOX delayed 04/28/2022 secondary to neutropenia and thrombocytopenia Rectal bleeding, lower abdominal pain, constipation secondary to #1 Multiple family members with cancer Hypercholesterolemia Port-A-Cath placement 02/20/2022 Hospital admission 03/11/2022-colonic obstruction secondary to colon cancer. Neutropenia secondary to chemotherapy,   Disposition: Karen Kaufman has completed 3 cycles of FOLFOX.  She tolerated the last cycle of chemotherapy well, but she has developed significant neutropenia and  thrombocytopenia.  Chemotherapy will be held today.  She  will be scheduled for cycle 4 FOLFOX with G-CSF support in 1 week.  We reviewed potential toxicities associated with G-CSF including the chance of bone pain, rash, and splenic rupture.  She agrees to proceed.  She has increased pain at the lower back with right leg weakness.  I am concerned the compression fracture noted on a CT in May may be a pathologic fracture with involvement of the right sided nerve root.  She will be referred for an urgent MRI of the lumbar spine.  Karen Kaufman will return for scheduled chemotherapy in 1 week.  We will contact her when the MRI result is available.  I refilled her prescription for hydrocodone.  She will be referred for a restaging CT abdomen/pelvis after cycle 4 FOLFOX.  Betsy Coder, MD  04/28/2022  9:28 AM

## 2022-04-28 NOTE — Progress Notes (Signed)
Patient seen by Dr. Benay Spice today  Vitals are within treatment parameters.  Labs reviewed by Dr. Benay Spice and are not all within treatment parameters. Platelet count 79,000 and ANC 0.9-- Hold tx today  Per physician team, patient will not be receiving treatment today.

## 2022-04-29 ENCOUNTER — Other Ambulatory Visit: Payer: Self-pay | Admitting: Oncology

## 2022-04-29 DIAGNOSIS — C20 Malignant neoplasm of rectum: Secondary | ICD-10-CM

## 2022-04-30 ENCOUNTER — Inpatient Hospital Stay: Payer: BC Managed Care – PPO

## 2022-05-01 ENCOUNTER — Encounter (HOSPITAL_COMMUNITY): Payer: Self-pay | Admitting: Nurse Practitioner

## 2022-05-04 ENCOUNTER — Other Ambulatory Visit: Payer: Self-pay

## 2022-05-04 ENCOUNTER — Inpatient Hospital Stay: Payer: BC Managed Care – PPO

## 2022-05-04 VITALS — BP 117/74 | HR 63 | Temp 99.1°F | Resp 18 | Ht 66.0 in | Wt 134.0 lb

## 2022-05-04 DIAGNOSIS — Z95828 Presence of other vascular implants and grafts: Secondary | ICD-10-CM

## 2022-05-04 DIAGNOSIS — C20 Malignant neoplasm of rectum: Secondary | ICD-10-CM

## 2022-05-04 LAB — CBC WITH DIFFERENTIAL (CANCER CENTER ONLY)
Abs Immature Granulocytes: 0.02 10*3/uL (ref 0.00–0.07)
Basophils Absolute: 0 10*3/uL (ref 0.0–0.1)
Basophils Relative: 0 %
Eosinophils Absolute: 0.1 10*3/uL (ref 0.0–0.5)
Eosinophils Relative: 3 %
HCT: 31.5 % — ABNORMAL LOW (ref 36.0–46.0)
Hemoglobin: 10.4 g/dL — ABNORMAL LOW (ref 12.0–15.0)
Immature Granulocytes: 1 %
Lymphocytes Relative: 43 %
Lymphs Abs: 1.1 10*3/uL (ref 0.7–4.0)
MCH: 30.1 pg (ref 26.0–34.0)
MCHC: 33 g/dL (ref 30.0–36.0)
MCV: 91 fL (ref 80.0–100.0)
Monocytes Absolute: 0.4 10*3/uL (ref 0.1–1.0)
Monocytes Relative: 17 %
Neutro Abs: 0.9 10*3/uL — ABNORMAL LOW (ref 1.7–7.7)
Neutrophils Relative %: 36 %
Platelet Count: 122 10*3/uL — ABNORMAL LOW (ref 150–400)
RBC: 3.46 MIL/uL — ABNORMAL LOW (ref 3.87–5.11)
RDW: 14.3 % (ref 11.5–15.5)
WBC Count: 2.5 10*3/uL — ABNORMAL LOW (ref 4.0–10.5)
nRBC: 0 % (ref 0.0–0.2)

## 2022-05-04 MED ORDER — SODIUM CHLORIDE 0.9% FLUSH
10.0000 mL | INTRAVENOUS | Status: DC | PRN
  Administered 2022-05-04: 10 mL via INTRAVENOUS

## 2022-05-04 MED ORDER — HEPARIN SOD (PORK) LOCK FLUSH 100 UNIT/ML IV SOLN
500.0000 [IU] | Freq: Once | INTRAVENOUS | Status: AC
  Administered 2022-05-04: 500 [IU] via INTRAVENOUS

## 2022-05-04 NOTE — Progress Notes (Signed)
Per MD Sherrill, no tx today. [place] does not meet parameters at 0.9 K/ul. Per MD Benay Spice, pt will be rescheduled for next week. Scheduling message sent. Pt will call [facility] wil further questions/concerns.

## 2022-05-04 NOTE — Patient Instructions (Signed)
Neutropenia °Neutropenia is a condition that occurs when you have low levels of neutrophils. Neutrophils are a type of white blood cells. They are made in the spongy center of bones (bone marrow). They fight infections. °Neutrophils are your body's main defense against infections. The fewer neutrophils you have and the longer your body remains without them, the greater your risk of getting a severe infection. °What are the causes? °This condition can occur if your body uses up or destroys neutrophils faster than your bone marrow can make them. Neutropenia may be caused by: °A bacterial or fungal infection. °Allergic disorders. °Reactions to some medicines. °An autoimmune disease. °An enlarged spleen. °This condition can also occur if your bone marrow does not produce enough neutrophils. This problem may be caused by: °Cancer. °Cancer treatments, such as radiation or chemotherapy. °Viral infections. °Medicines, such as phenytoin. °Vitamin B12 deficiency. °Diseases of the bone marrow. °Environmental toxins, such as insecticides. °What are the signs or symptoms? °This condition does not usually cause symptoms. If symptoms are present, they are usually caused by an underlying infection. Symptoms of an infection may include: °Fever. °Chills. °Swollen glands. °Mouth ulcers. °Cough. °Rash or skin infection. Skin may be red, swollen, or painful. °Abdominal or rectal pain. °Frequent urination or pain or burning with urination. °Because neutropenia weakens the immune system, symptoms of infection may be reduced. It is important to be aware of any changes in your body and talk to your health care provider. °How is this diagnosed? °This condition is diagnosed based on your medical history and a physical exam. Tests will also be done, such as: °A complete blood count (CBC). °Bone marrow biopsy. This is collecting a sample of bone marrow for testing. °A chest X-ray. °A urine culture. °A blood culture. °How is this  treated? °Treatment depends on the underlying cause and severity of your condition. Mild neutropenia may not require treatment. Treatment may include medicines, such as: °Antibiotic medicine given through an IV. °Antiviral medicines. °Antifungal medicines. °A medicine to increase production of neutrophils (colony-stimulating factor). You may get this medicine through an IV or by injection. °Steroids given through an IV. °If an underlying condition is causing neutropenia, you may need treatment for that condition. If medicines or cancer treatments are causing neutropenia, your health care provider may have you stop the medicines or treatment. °Follow these instructions at home: °Medicines ° °Take over-the-counter and prescription medicines only as told by your health care provider. °Get an annual flu shot. Ask your health care provider whether you or anyone you live with needs any other vaccines. °Eating and drinking °Do not share food utensils. °Do not eat unpasteurized foods. °Do not eat raw or undercooked meat, eggs, or seafood. °Do not eat unwashed, raw fruits or vegetables. °Lifestyle °Avoid exposure to groups of people or children. °Avoid being around people who are sick. °Avoid being around live plants or fresh flowers. °Avoid being around dirt or dust, such as in construction areas or gardens. Wear gloves if you are going to do yard work or gardening. °Do not provide direct care for pets. Avoid animal droppings. Do not clean litter boxes and bird cages. °Do not have sex unless your health care provider has approved. °Hygiene ° °Bathe daily. °Clean the area between the genitals and the anus (perineal area) after you urinate or have a bowel movement. If you are female, wipe from front to back. °Get regular dental care and brush your teeth with a soft toothbrush before and after meals. °Do not use   a regular razor. Use an electric razor to remove hair. Wash your hands often with soap and water for at least 20  seconds. Make sure others who come in contact with you also wash their hands. If soap and water are not available, use hand sanitizer. General instructions Take steps to reduce your risk of injury or infection. Follow any precautions as told by your health care provider. Take actions to avoid cuts and burns. For example: Be cautious when you use knives. Always cut away from yourself. Keep knives in protective sheaths or guards when not in use. Use oven mitts when you cook with a hot stove, oven, or grill. Stand a safe distance away from open fires. Do not use tampons, enemas, or rectal suppositories unless your health care provider has approved. Keep all follow-up visits. This is important. Contact a health care provider if: You have a cough. You have a sore throat. You develop sores in your mouth or anus. You have a warm, red, or tender area on your skin. You have red streaks on the skin. You develop a rash. You have swollen lymph nodes. You have frequent or painful urination. You have vaginal discharge or itching. Get help right away if: You have a fever. You have chills or shaking. You have nausea or vomiting. You have a lot of fatigue. You have shortness of breath. Summary Neutropenia is a condition that occurs when you have a lower-than-normal level of a type of white blood cell (neutrophils) in your body. This condition can occur if your body uses up or destroys neutrophils faster than your bone marrow can make them. Treatment depends on the underlying cause and severity of your condition. Mild neutropenia may not require treatment. Follow any precautions as told by your health care provider to reduce your risk for injury or infection. This information is not intended to replace advice given to you by your health care provider. Make sure you discuss any questions you have with your health care provider. Document Revised: 04/02/2021 Document Reviewed: 04/02/2021 Elsevier Patient  Education  [place].

## 2022-05-04 NOTE — Patient Instructions (Signed)

## 2022-05-06 ENCOUNTER — Inpatient Hospital Stay: Payer: BC Managed Care – PPO

## 2022-05-07 ENCOUNTER — Ambulatory Visit
Admission: RE | Admit: 2022-05-07 | Discharge: 2022-05-07 | Disposition: A | Discharge status: Home / Self Care | Payer: BC Managed Care – PPO | Source: Ambulatory Visit | Attending: Oncology | Admitting: Oncology

## 2022-05-07 DIAGNOSIS — C20 Malignant neoplasm of rectum: Secondary | ICD-10-CM

## 2022-05-08 ENCOUNTER — Telehealth: Payer: Self-pay | Admitting: *Deleted

## 2022-05-08 ENCOUNTER — Ambulatory Visit
Admission: RE | Admit: 2022-05-08 | Discharge: 2022-05-08 | Disposition: A | Discharge status: Home / Self Care | Payer: BC Managed Care – PPO | Source: Ambulatory Visit | Attending: Radiation Oncology | Admitting: Radiation Oncology

## 2022-05-08 ENCOUNTER — Ambulatory Visit (HOSPITAL_BASED_OUTPATIENT_CLINIC_OR_DEPARTMENT_OTHER)
Admission: RE | Admit: 2022-05-08 | Discharge: 2022-05-08 | Disposition: A | Discharge status: Home / Self Care | Payer: BC Managed Care – PPO | Source: Ambulatory Visit | Attending: Oncology | Admitting: Oncology

## 2022-05-08 ENCOUNTER — Other Ambulatory Visit: Payer: Self-pay

## 2022-05-08 DIAGNOSIS — C7951 Secondary malignant neoplasm of bone: Secondary | ICD-10-CM | POA: Insufficient documentation

## 2022-05-08 DIAGNOSIS — C20 Malignant neoplasm of rectum: Secondary | ICD-10-CM | POA: Insufficient documentation

## 2022-05-08 DIAGNOSIS — E785 Hyperlipidemia, unspecified: Secondary | ICD-10-CM | POA: Diagnosis not present

## 2022-05-08 DIAGNOSIS — Z85828 Personal history of other malignant neoplasm of skin: Secondary | ICD-10-CM | POA: Diagnosis not present

## 2022-05-08 DIAGNOSIS — Z79899 Other long term (current) drug therapy: Secondary | ICD-10-CM | POA: Diagnosis not present

## 2022-05-08 DIAGNOSIS — Z8042 Family history of malignant neoplasm of prostate: Secondary | ICD-10-CM | POA: Diagnosis not present

## 2022-05-08 DIAGNOSIS — Z510 Encounter for antineoplastic radiation therapy: Secondary | ICD-10-CM | POA: Diagnosis not present

## 2022-05-08 DIAGNOSIS — G893 Neoplasm related pain (acute) (chronic): Secondary | ICD-10-CM | POA: Diagnosis not present

## 2022-05-08 DIAGNOSIS — M47816 Spondylosis without myelopathy or radiculopathy, lumbar region: Secondary | ICD-10-CM | POA: Insufficient documentation

## 2022-05-08 DIAGNOSIS — Z923 Personal history of irradiation: Secondary | ICD-10-CM | POA: Insufficient documentation

## 2022-05-08 NOTE — Progress Notes (Signed)
  Radiation Oncology         [phone] ________________________________  Name: Karen Kaufman MRN: [id number]  Date: 05/08/2022  DOB: 11/10/1960  SIMULATION AND TREATMENT PLANNING NOTE    ICD-10-CM   1. Rectal cancer ([facility])  C20     2. Pain from bone metastases ([facility])  G89.3    C79.51       DIAGNOSIS:  61 y.o. 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  Identity was confirmed.  All relevant records and images related to the planned course of therapy were reviewed.  The patient freely provided informed written consent to proceed with treatment after reviewing the details related to the planned course of therapy. The consent form was witnessed and verified by the simulation staff.  Then, the patient was set-up in a stable reproducible  supine position for radiation therapy.  CT images were obtained.  Surface markings were placed.  The CT images were loaded into the planning software.  Then the target and avoidance structures were contoured including kidneys.  Treatment planning then occurred.  The radiation prescription was entered and confirmed.  Then, I designed and supervised the construction of a total of 3 medically necessary complex treatment devices with VacLoc positioner and 2 MLCs to shield kidneys.  I have requested : 3D Simulation  I have requested a DVH of the following structures: Left Kidney, Right Kidney and target.  PLAN:  The patient will receive 30 Gy in 10 fractions.  ________________________________  Sheral Apley Tammi Klippel, M.D.

## 2022-05-08 NOTE — Progress Notes (Signed)
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 Pain from bone metastases (HCC)  G89.3    C79.51       HISTORY OF PRESENT ILLNESS::Karen Kaufman is a 61 y.o. woman developed intermittent fecal incontinence in early 2022.  She noted onset of constipation in December 2022.  This progressively worsened necessitating use of a laxative.  She developed a constant aching discomfort at the low abdomen about 2 weeks ago.  On 02/08/2022 she had an episode of rectal bleeding.  This prompted a visit to the emergency department.  CT abdomen/pelvis without contrast showed severe circumferential wall thickening of the distal sigmoid colon and rectum; enlarged perirectal and low left retroperitoneal lymph nodes; multiple ill-defined hypodense liver lesions; trace ascites in the low pelvis.  She underwent a colonoscopy by Dr. Fuller Plan on 02/08/2022.  A fungating almost completely obstructing medium-sized mass was found in the rectosigmoid colon measuring from 12 to 17 cm.  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   Foundation 1-microsatellite stable, tumor mutation burden 5, [id number], K-ras wild-type.  On 02/09/2022 CEA was 3.2.  She had CT chest 02/18/2022 showing chest adenopathy and Multiple hypodense liver lesions.  On 02/20/2022 biopsy of liver lesion yielded fragments of benign liver parenchyma with reactive changes.  No evidence of malignancy.  She had MRI abdomen on 03/01/2022 confirmed numerous hypovascular hepatic lesions have imaging characteristics considered diagnostic of widespread metastatic disease to the liver. There is also retroperitoneal  lymphadenopathy concerning for metastatic  disease.  She started Cycle 1 FOLFOX 03/02/2022.  She developed back pain and MRI lumbar spine yesterday showed Pathologic compression fractures of L1 and L3 with associated 60% and 40% vertebral body height loss, respectively. No retropulsion. She has kindly been referred today for consideration of palliative radiotherapy.  PREVIOUS RADIATION THERAPY: No  Past Medical History:  Diagnosis Date   Allergy    Cancer ([place])    skin cancer removed by dermatology   Cataract    Hyperlipidemia   :   Past Surgical History:  Procedure Laterality Date   ABDOMINAL HYSTERECTOMY  2002   BOWEL DECOMPRESSION N/A 02/09/2022   Procedure: BOWEL DECOMPRESSION;  Surgeon: Ladene Artist, MD;  Location: [facility];  Service: Gastroenterology;  Laterality: N/A;   Silex   craniotomy-removal of tumor from behind right eye   BREAST SURGERY  2003   breast implants   COLONOSCOPY WITH PROPOFOL N/A 02/09/2022   Procedure: COLONOSCOPY WITH PROPOFOL;  Surgeon: Ladene Artist, MD;  Location: [facility];  Service: Gastroenterology;  Laterality: N/A;   EYE SURGERY     bilateral cataract removal   FLEXIBLE SIGMOIDOSCOPY N/A 02/09/2022   Procedure: FLEXIBLE SIGMOIDOSCOPY;  Surgeon: Ladene Artist, MD;  Location: [facility];  Service: Gastroenterology;  Laterality: N/A;   ILEO LOOP COLOSTOMY CLOSURE N/A 03/12/2022   Procedure: LAPAROSCOPIC ASSISTED DIVERTING COLOSTOMY;  Surgeon: Michael Boston, MD;  Location: [facility];  Service: General;  Laterality: N/A;   IR IMAGING GUIDED PORT INSERTION  02/20/2022   IR US GUIDE BX ASP/DRAIN  02/20/2022   POLYPECTOMY  02/09/2022   Procedure: POLYPECTOMY;  Surgeon: Ladene Artist, MD;  Location: [facility];  Service: Gastroenterology;;  :  Current Outpatient Medications:    atorvastatin (LIPITOR) 20 MG tablet, Take 1 tablet (20 mg total) by mouth daily., Disp: 90 tablet, Rfl: 3   HYDROcodone-acetaminophen (NORCO) 5-325 MG tablet, Take 1-2 tablets by mouth every 4  (four) hours as needed for moderate pain., Disp: 60 tablet, Rfl: 0   lidocaine-prilocaine (EMLA) cream, Apply to port site 1-2 hours prior to use, Disp: 30 g, Rfl: 2   magnesium oxide (MAG-OX) 400 MG tablet, Take 400 mg by mouth daily., Disp: , Rfl:    ondansetron (ZOFRAN) 8 MG tablet, Take 1 tablet (8 mg total) by mouth every 8 (eight) hours as needed for nausea or vomiting. Do not take until 72 hours after chemo, Disp: 20 tablet, Rfl: 0   ondansetron (ZOFRAN-ODT) 4 MG disintegrating tablet, Take 1 tablet (4 mg total) by mouth every 8 (eight) hours as needed for nausea or vomiting. (Patient not taking: Reported on 04/28/2022), Disp: 20 tablet, Rfl: 0   polyethylene glycol (MIRALAX / GLYCOLAX) 17 g packet, Take 17 g by mouth daily., Disp: , Rfl:    prochlorperazine (COMPAZINE) 10 MG tablet, Take 1 tablet (10 mg total) by mouth every 6 (six) hours as needed for nausea or vomiting. (Patient not taking: Reported on 04/28/2022), Disp: 30 tablet, Rfl: 2 No current facility-administered medications for this visit.  Facility-Administered Medications Ordered in Other Visits:    sodium chloride flush (NS) 0.9 % injection 10 mL, 10 mL, Intravenous, PRN, Ladell Pier, MD, 10 mL at 04/28/22 1017:   Allergies  Allergen Reactions   Iodine Anaphylaxis   Shellfish Allergy Anaphylaxis   Hydromorphone     "Burning Sensation"  :   Family History  Problem Relation Age of Onset   Cancer Mother        breast   Diabetes Mother    Hyperlipidemia Mother    Hypertension Mother    Cancer Father        prostate   Arthritis Maternal Grandmother    Arthritis Maternal Grandfather    Cancer Maternal Grandfather        hodgkins lymphoma   Arthritis Paternal Grandmother    Arthritis Paternal Grandfather    Diabetes Paternal Grandfather    Colon cancer Neg Hx    Pancreatic cancer Neg Hx    Esophageal cancer Neg Hx   :   Social History   Socioeconomic History   Marital status: Married    Spouse name:  Not on file   Number of children: 2   Years of education: 13   Highest education level: Some college, no degree  Occupational History   Occupation: retired  Tobacco Use   Smoking status: Never   Smokeless tobacco: Never  Vaping Use   Vaping Use: Never used  Substance and Sexual Activity   Alcohol use: Not Currently   Drug use: Not Currently   Sexual activity: Yes    Birth control/protection: Surgical  Other Topics Concern   Not on file  Social History Narrative   Not on file   Social Determinants of Health   Financial Resource Strain: Low Risk  (02/17/2022)   Overall Financial Resource Strain (CARDIA)    Difficulty of Paying Living Expenses: Not very hard  Food Insecurity: No Food Insecurity (02/17/2022)   Hunger Vital Sign    Worried About Running Out of Food in the Last Year: Never true    Ran Out of Food in the Last Year: Never true  Transportation Needs: No Transportation Needs (02/17/2022)  PRAPARE - Hydrologist (Medical): No    Lack of Transportation (Non-Medical): No  Physical Activity: Insufficiently Active (02/17/2022)   Exercise Vital Sign    Days of Exercise per Week: 3 days    Minutes of Exercise per Session: 20 min  Stress: No Stress Concern Present (02/17/2022)   [place]    Feeling of Stress : Only a little  Social Connections: Socially Integrated (02/17/2022)   Social Connection and Isolation Panel [NHANES]    Frequency of Communication with Friends and Family: Three times a week    Frequency of Social Gatherings with Friends and Family: Three times a week    Attends Religious Services: 1 to 4 times per year    Active Member of Clubs or Organizations: Yes    Attends Archivist Meetings: 1 to 4 times per year    Marital Status: Married  Human resources officer Violence: Not At Risk (02/17/2022)   Humiliation, Afraid, Rape, and Kick questionnaire    Fear of Current  or Ex-Partner: No    Emotionally Abused: No    Physically Abused: No    Sexually Abused: No  :  REVIEW OF SYSTEMS:  A 15 point review of systems is documented in the electronic medical record. This was obtained by the nursing staff. However, I reviewed this with the patient to discuss relevant findings and make appropriate changes.  Pertinent items are noted in HPI.   PHYSICAL EXAM:  There were no vitals taken for this visit. Motor strength lower extremities 5/5 and light touch intact without nerve level.  KPS = 80  100 - Normal; no complaints; no evidence of disease. 90   - Able to carry on normal activity; minor signs or symptoms of disease. 80   - Normal activity with effort; some signs or symptoms of disease. 82   - Cares for self; unable to carry on normal activity or to do active work. 60   - Requires occasional assistance, but is able to care for most of his personal needs. 50   - Requires considerable assistance and frequent medical care. 58   - Disabled; requires special care and assistance. 88   - Severely disabled; hospital admission is indicated although death not imminent. 52   - Very sick; hospital admission necessary; active supportive treatment necessary. 10   - Moribund; fatal processes progressing rapidly. 0     - Dead  Karnofsky DA, Abelmann [place], Craver LS and Burchenal [facility] [phone]) The use of the nitrogen mustards in the palliative treatment of carcinoma: with particular reference to bronchogenic carcinoma Cancer 1 634-56  LABORATORY DATA:  Lab Results  Component Value Date   WBC 2.5 (L) 05/04/2022   HGB 10.4 (L) 05/04/2022   HCT 31.5 (L) 05/04/2022   MCV 91.0 05/04/2022   PLT 122 (L) 05/04/2022   Lab Results  Component Value Date   NA 144 04/28/2022   K 3.8 04/28/2022   CL 104 04/28/2022   CO2 28 04/28/2022   Lab Results  Component Value Date   ALT 20 04/28/2022   AST 32 04/28/2022   ALKPHOS 163 (H) 04/28/2022   BILITOT 0.6 04/28/2022      RADIOGRAPHY: MR Lumbar Spine Wo Contrast  Result Date: 05/07/2022 CLINICAL DATA:  Rectal cancer ([place]) C20 (ICD-10-CM). Low back pain, cancer suspected; Lumbar plexopathy, malignancy; metastatic rectal cancer, back pain, rt. leg weakness. EXAM: MRI LUMBAR SPINE WITHOUT CONTRAST TECHNIQUE: Multiplanar,  multisequence MR imaging of the lumbar spine was performed. No intravenous contrast was administered. COMPARISON:  None Available. FINDINGS: Segmentation:  Standard. Alignment:  Physiologic. Vertebrae: Compression fractures of the superior endplate of L1 and L3 with associated marked if trees T1 signal in the anterior aspect of the L1 vertebral body and involving the right lateral aspect of the L3 vertebral body extending into the corresponding pedicle, concerning for malignancy. There is approximately 60% and 40% vertebral body height loss, respectively. No retropulsion. No associated soft tissue component. T1 hypointense lesion without associated fracture is seen in the posterior aspect of the L5 vertebral body, eccentric to the right. Small T1 hypointense lesions are also seen in the L1 and L3 spinous processes, sacrum and left iliac bone. Conus medullaris and cauda equina: Conus extends to the L1-2 level. Conus and cauda equina appear normal. Paraspinal and other soft tissues: Bilateral renal cysts. Disc levels: T12-L1: Shallow disc bulge. No significant spinal canal or neural foraminal stenosis. L1-2: Shallow disc bulge and mild facet degenerative changes. No significant spinal canal or neural foraminal stenosis. L2-3: Shallow disc bulge and mild facet degenerative changes. No spinal canal or neural foraminal stenosis. L3-4: Mild facet degenerative changes. No spinal canal or neural foraminal stenosis. L4-5: Shallow disc bulge and mild facet degenerative changes without significant spinal canal or neural foraminal stenosis. L5-S1: Shallow disc bulge with associated right subarticular annular tear and mild facet  degenerative changes. No significant spinal canal or neural foraminal stenosis. IMPRESSION: 1. Pathologic compression fractures of L1 and L3 with associated 60% and 40% vertebral body height loss, respectively. No retropulsion. 2. Multiple other T1 hypointense lesion scattered in the visualized osseous structures, concerning for metastatic disease. 3. Mild degenerative changes of the lumbar spine without high-grade spinal canal or neural foraminal stenosis at any level. Electronically Signed   By: Pedro Earls M.D.   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  She may benefit from palliative radiotherapy to help reduce pain and preserve spinal cord/nerve root function.  PLAN:Today, I talked to the patient and family about the findings and work-up thus far.  We discussed the natural history of spinal metastases and general treatment, highlighting the role of radiotherapy in the management.  We discussed the available radiation techniques, and focused on the details of logistics and delivery.  We reviewed the anticipated acute and late sequelae associated with radiation in this setting.  The patient was encouraged to ask questions that I answered to the best of my ability.    The patient would like to proceed with radiation and will be scheduled for CT simulation.  I personally spent 45 minutes in this encounter including chart review, reviewing radiological studies, meeting face-to-face with the patient, entering orders and completing documentation.    ------------------------------------------------   Tyler Pita, MD [place] Director and Director of Stereotactic Radiosurgery Direct Dial: [phone]  Fax: [phone] [email or web address]  Skype  LinkedIn

## 2022-05-08 NOTE — Telephone Encounter (Signed)
Per Dr. Benay Spice: Able to move her CT scan to today at 5:45/6 pm at [facility]. She will be NPO after 2 pm and consume oral contrast at 4 pm and 5 pm. Will not need port accessed since CT is without oral contrast. MD will see her on Monday. She is aware. Reports sees radiation oncology today for [facility] and may be able to have 1st treatment.

## 2022-05-08 NOTE — Progress Notes (Signed)
Histology and Location of Primary Cancer: Rectal Ca  Sites of Visceral and Bony Metastatic Disease: Lumbar Spine  05/07/2022 Dr. Benay Spice MR Lumbar Spine CLINICAL DATA:  Rectal cancer ([place]) C20 (ICD-10-CM). Low back pain, cancer suspected; Lumbar plexopathy, malignancy; metastatic rectal cancer, back pain, rt. leg weakness  FINDINGS: Segmentation:  Standard.   Alignment:  Physiologic.   Vertebrae: Compression fractures of the superior endplate of L1 and L3 with associated marked if trees T1 signal in the anterior aspect of the L1 vertebral body and involving the right lateral aspect of the L3 vertebral body extending into the corresponding pedicle, concerning for malignancy. There is approximately 60% and 40% vertebral body height loss, respectively. No retropulsion. No associated soft tissue component. T1 hypointense lesion without associated fracture is seen in the posterior aspect of the L5 vertebral body, eccentric to the right. Small T1 hypointense lesions are also seen in the L1 and L3 spinous processes, sacrum and left iliac bone.   Conus medullaris and cauda equina: Conus extends to the L1-2 level.  Conus and cauda equina appear normal.   Paraspinal and other soft tissues: Bilateral renal cysts.   Disc levels:   T12-L1: Shallow disc bulge. No significant spinal canal or neural foraminal stenosis.   L1-2: Shallow disc bulge and mild facet degenerative changes. No significant spinal canal or neural foraminal stenosis.   L2-3: Shallow disc bulge and mild facet degenerative changes. No spinal canal or neural foraminal stenosis.   L3-4: Mild facet degenerative changes. No spinal canal or neural foraminal stenosis.   L4-5: Shallow disc bulge and mild facet degenerative changes without significant spinal canal or neural foraminal stenosis.   L5-S1: Shallow disc bulge with associated right subarticular annular tear and mild facet degenerative changes. No significant spinal canal or  neural foraminal stenosis.   IMPRESSION: 1. Pathologic compression fractures of L1 and L3 with associated 60% and 40% vertebral body height loss, respectively. No retropulsion. 2. Multiple other T1 hypointense lesion scattered in the visualized osseous structures, concerning for metastatic disease. 3. Mild degenerative changes of the lumbar spine without high-grade spinal canal or neural foraminal stenosis at any level.   03/11/2022  Dr. Tomi Bamberger CT Abdomen Pelvis,without Contrast CLINICAL DATA:  Abdominal pain and bloating for several days. No bowel movement for 4 days. History of metastatic rectal cancer.   FINDINGS: Lower chest: No acute abnormality at the lung bases. Mildly prominent 0.8 cm right pericardiophrenic lymph node (series 2/image 10), increased from 0.6 cm on 02/08/2022 CT. Partially visualized bilateral breast prostheses.   Hepatobiliary: Several (greater than 5) ill-defined hypodense liver masses scattered throughout the liver, appearing mildly increased since 02/08/2022 CT. Representative 2.4 cm posterior right liver mass (series 2/image 17), increased from 1.8 cm. Central right liver 1.5 cm mass (series 2/image 12), mildly increased from 1.2 cm.  Anterior left liver 1.3 cm mass (series 2/image 13), increased from 1.1 cm. Normal gallbladder with no radiopaque cholelithiasis. No biliary ductal dilatation.   Pancreas: Normal, with no mass or duct dilation.   Spleen: Normal size. No mass.   Adrenals/Urinary Tract: Normal adrenals. No renal stones. No hydronephrosis. Simple 1.4 cm medial interpolar right renal cyst, for which no follow-up is recommended. Otherwise no contour deforming renal masses. Normal bladder.   Stomach/Bowel: Normal non-distended stomach. No dilated small bowel loops. Scattered air-fluid levels in the distal small bowel. No small bowel wall thickening. Mildly dilated fluid-filled appendix without appendiceal wall thickening or significant periappendiceal fat  stranding. Annular wall thickening at the rectosigmoid  junction involving an approximately 8 cm in length segment, proximal to which the colon is prominently distended with widespread colonic air-fluid levels. Cecal diameter 8.8 cm.   Vascular/Lymphatic: Normal caliber abdominal aorta. Left para-lymphadenopathy up to 1.0 cm (series 2/image 36), increased from 0.7 cm. Multiple enlarged left perirectal nodes, largest 1.6 cm short axis diameter (series 2/image 65), stable.   Reproductive: Status post hysterectomy, with no abnormal findings at the vaginal cuff. No adnexal mass.   Other: No pneumoperitoneum. Small volume pelvic ascites. Presacral space ill-defined fluid and perirectal fat stranding is mildly increased. No focal fluid collection.   Musculoskeletal: Moderate L1 vertebral compression fracture is new since 02/08/2022 CT, without definite discrete osseous lesions. Mild thoracolumbar spondylosis.   IMPRESSION: 1. Distal large bowel obstruction at the site of a large annular mass at the rectosigmoid junction. 2. Progressive liver metastases. 3. Progressive perirectal, left para-aortic and right pericardiophrenic nodal metastases. 4. Small volume pelvic ascites. 5. Moderate L1 vertebral compression fracture, new since 02/08/2022 CT, without definite underlying osseous lesion.   Location(s) of Symptomatic Metastases: Thoracic/Lumbar spine  Past/Anticipated chemotherapy by medical oncology, if any:   Pain on a scale of 0-10 is:  2-3/10 lumbar spine   If Spine Met(s), symptoms, if any, include: Bowel/Bladder retention or incontinence (please describe): No, has colostomy. Numbness or weakness in extremities (please describe): No Current Decadron regimen, if applicable: No  Ambulatory status? Walker? Wheelchair?:   SAFETY ISSUES: Prior radiation?  No Pacemaker/ICD? No Possible current pregnancy? No Is the patient on methotrexate? No  Current Complaints / other details:   Port-A-Cath on right chest.

## 2022-05-11 ENCOUNTER — Other Ambulatory Visit: Payer: Self-pay

## 2022-05-11 ENCOUNTER — Ambulatory Visit
Admission: RE | Admit: 2022-05-11 | Discharge: 2022-05-11 | Disposition: A | Discharge status: Home / Self Care | Payer: BC Managed Care – PPO | Source: Ambulatory Visit | Attending: Radiation Oncology | Admitting: Radiation Oncology

## 2022-05-11 ENCOUNTER — Inpatient Hospital Stay (HOSPITAL_BASED_OUTPATIENT_CLINIC_OR_DEPARTMENT_OTHER): Payer: BC Managed Care – PPO | Admitting: Oncology

## 2022-05-11 ENCOUNTER — Inpatient Hospital Stay: Payer: BC Managed Care – PPO

## 2022-05-11 ENCOUNTER — Ambulatory Visit (HOSPITAL_BASED_OUTPATIENT_CLINIC_OR_DEPARTMENT_OTHER)
Admission: RE | Admit: 2022-05-11 | Discharge: 2022-05-11 | Disposition: A | Discharge status: Home / Self Care | Payer: BC Managed Care – PPO | Source: Ambulatory Visit | Attending: Oncology | Admitting: Oncology

## 2022-05-11 ENCOUNTER — Other Ambulatory Visit: Payer: Self-pay | Admitting: Oncology

## 2022-05-11 VITALS — BP 128/74 | HR 89 | Temp 98.2°F | Resp 18 | Wt 133.2 lb

## 2022-05-11 DIAGNOSIS — C20 Malignant neoplasm of rectum: Secondary | ICD-10-CM

## 2022-05-11 DIAGNOSIS — Z510 Encounter for antineoplastic radiation therapy: Secondary | ICD-10-CM | POA: Diagnosis not present

## 2022-05-11 LAB — RAD ONC ARIA SESSION SUMMARY
Course Elapsed Days: 0
Plan Fractions Treated to Date: 1
Plan Prescribed Dose Per Fraction: 3 Gy
Plan Total Fractions Prescribed: 10
Plan Total Prescribed Dose: 30 Gy
Reference Point Dosage Given to Date: 3 Gy
Reference Point Session Dosage Given: 3 Gy
Session Number: 1

## 2022-05-11 LAB — CBC WITH DIFFERENTIAL (CANCER CENTER ONLY)
Abs Immature Granulocytes: 0.03 10*3/uL (ref 0.00–0.07)
Basophils Absolute: 0.1 10*3/uL (ref 0.0–0.1)
Basophils Relative: 1 %
Eosinophils Absolute: 0.3 10*3/uL (ref 0.0–0.5)
Eosinophils Relative: 5 %
HCT: 35.7 % — ABNORMAL LOW (ref 36.0–46.0)
Hemoglobin: 11.8 g/dL — ABNORMAL LOW (ref 12.0–15.0)
Immature Granulocytes: 1 %
Lymphocytes Relative: 21 %
Lymphs Abs: 1.1 10*3/uL (ref 0.7–4.0)
MCH: 30.3 pg (ref 26.0–34.0)
MCHC: 33.1 g/dL (ref 30.0–36.0)
MCV: 91.8 fL (ref 80.0–100.0)
Monocytes Absolute: 0.5 10*3/uL (ref 0.1–1.0)
Monocytes Relative: 9 %
Neutro Abs: 3.4 10*3/uL (ref 1.7–7.7)
Neutrophils Relative %: 63 %
Platelet Count: 122 10*3/uL — ABNORMAL LOW (ref 150–400)
RBC: 3.89 MIL/uL (ref 3.87–5.11)
RDW: 14.3 % (ref 11.5–15.5)
WBC Count: 5.4 10*3/uL (ref 4.0–10.5)
nRBC: 0 % (ref 0.0–0.2)

## 2022-05-11 LAB — CMP (CANCER CENTER ONLY)
ALT: 21 U/L (ref 0–44)
AST: 28 U/L (ref 15–41)
Albumin: 4.3 g/dL (ref 3.5–5.0)
Alkaline Phosphatase: 141 U/L — ABNORMAL HIGH (ref 38–126)
Anion gap: 10 (ref 5–15)
BUN: 6 mg/dL — ABNORMAL LOW (ref 8–23)
CO2: 30 mmol/L (ref 22–32)
Calcium: 9.7 mg/dL (ref 8.9–10.3)
Chloride: 103 mmol/L (ref 98–111)
Creatinine: 0.61 mg/dL (ref 0.44–1.00)
GFR, Estimated: >60 mL/min (ref >60)
Glucose, Bld: 103 mg/dL — ABNORMAL HIGH (ref 70–99)
Potassium: 3.8 mmol/L (ref 3.5–5.1)
Sodium: 143 mmol/L (ref 135–145)
Total Bilirubin: 0.5 mg/dL (ref 0.3–1.2)
Total Protein: 6.8 g/dL (ref 6.5–8.1)

## 2022-05-11 NOTE — Progress Notes (Signed)
[facility] OFFICE PROGRESS NOTE   Diagnosis: Rectal cancer  INTERVAL HISTORY:   Karen Kaufman returns as scheduled.  She continues to have lower back pain.  No right leg weakness.  She has pain in the right thigh that is worse with weightbearing.  She has intermittent discomfort in the right groin.  The MRI of the lumbar spine 05/07/2022 revealed pathologic compression fractures at L1 and L3.  She saw Dr. Tammi Klippel and is scheduled to begin palliative radiation today.  Objective:  Vital signs in last 24 hours:  Blood pressure 128/74, pulse 89, temperature 98.2 F (36.8 C), temperature source Oral, resp. rate 18, weight 133 lb 3.2 oz (60.4 kg), SpO2 100 %.    HEENT: No thrush or ulcers Lymphatics: No inguinal or femoral nodes Resp: Lungs clear bilaterally Cardio: Regular rate and rhythm GI: No hepatosplenomegaly, left lower quadrant colostomy Vascular: No leg edema Neuro: The motor exam appears intact in the legs and feet bilaterally, slight weakness with flexion at the right hip versus guarding secondary to pain Musculoskeletal: Tender to percussion at the right thigh  Portacath/PICC-without erythema  Lab Results:  Lab Results  Component Value Date   WBC 2.5 (L) 05/04/2022   HGB 10.4 (L) 05/04/2022   HCT 31.5 (L) 05/04/2022   MCV 91.0 05/04/2022   PLT 122 (L) 05/04/2022   NEUTROABS 0.9 (L) 05/04/2022    CMP  Lab Results  Component Value Date   NA 144 04/28/2022   K 3.8 04/28/2022   CL 104 04/28/2022   CO2 28 04/28/2022   GLUCOSE 115 (H) 04/28/2022   BUN 7 (L) 04/28/2022   CREATININE 0.58 04/28/2022   CALCIUM 9.7 04/28/2022   PROT 6.4 (L) 04/28/2022   ALBUMIN 4.1 04/28/2022   AST 32 04/28/2022   ALT 20 04/28/2022   ALKPHOS 163 (H) 04/28/2022   BILITOT 0.6 04/28/2022   GFRNONAA >60 04/28/2022   GFRAA 92 11/28/2020    Lab Results  Component Value Date   CEA1 3.2 02/09/2022    Lab Results  Component Value Date   INR 0.9 02/20/2022   LABPROT  11.9 02/20/2022    Imaging:  CT ABDOMEN PELVIS WO CONTRAST  Result Date: 05/11/2022 CLINICAL DATA:  Rectal cancer staging.  * Tracking Code: BO * EXAM: CT ABDOMEN AND PELVIS WITHOUT CONTRAST TECHNIQUE: Multidetector CT imaging of the abdomen and pelvis was performed following the standard protocol without IV contrast. RADIATION DOSE REDUCTION: This exam was performed according to the departmental dose-optimization program which includes automated exposure control, adjustment of the mA and/or kV according to patient size and/or use of iterative reconstruction technique. COMPARISON:  MR lumbar spine 05/07/2022, CT abdomen pelvis 03/11/2022, MR abdomen 03/01/2022 and CT abdomen pelvis 02/08/2022. FINDINGS: Lower chest: Trace right pleural fluid. Lung bases are otherwise clear. Heart size normal. Prepericardiac/bilateral juxtadiaphragmatic lymph nodes measure up to 8 mm on the right, similar. Distal periaortic lymph nodes measure up to 6 mm, also similar. Distal esophagus is grossly unremarkable. Hepatobiliary: Subtle hypodense lesions in both lobes of the liver have decreased in size in the interval. Index right hepatic lobe nodule measures 12 mm (2/16), previously 15 mm. A second index nodule in the left hepatic lobe measures 9 mm (2/14), previously 13 mm. Liver and gallbladder are otherwise unremarkable. No biliary ductal dilatation. Pancreas: Negative. Spleen: Negative. Adrenals/Urinary Tract: Adrenal glands are unremarkable. Low-attenuation lesions in the kidneys measure up to 13 mm on the right and are likely cysts. No specific follow-up necessary. Kidneys  are otherwise unremarkable. Ureters are decompressed. Bladder is relatively low in volume. Stomach/Bowel: Stomach and majority of the small bowel are unremarkable. Interval diverting ileal loop colostomy in the left lower quadrant. Appendix and colon are otherwise unremarkable. Rectal wall thickening with perirectal inflammatory stranding/haziness and  presacral thickening, possibly minimally improved from 03/11/2022. Previously measured perirectal lymph nodes have decreased in size, now measuring up to 10 mm (2/55), previously 1.6 cm. Vascular/Lymphatic: Vascular structures are unremarkable. Improving perirectal adenopathy is discussed above. Right-sided iliac chain lymph nodes are subcentimeter in size. Index right external iliac lymph node measures 5 mm (2/56), decreased from 9 mm. Left external iliac lymph nodes measure up to 6 mm (2/58). Reproductive: Hysterectomy.  No adnexal mass. Other: No free fluid. Mesenteries and peritoneum are otherwise unremarkable. Musculoskeletal: New 1.6 cm lytic lesion in L5 (2/43). Pathologic fractures of L1 and L3, as better seen and described on comparison lumbar spine MR from yesterday. Additional suspicious lesions in the visualized osseous structures, better seen on that study as well. IMPRESSION: 1. Mixed response to therapy as evidenced by decrease in size of liver metastases and improving pelvic retroperitoneal and perirectal adenopathy. Similar prepericardiac/juxtadiaphragmatic lymph nodes. New L5 metastasis. 2. Interval diverting ileal loop colostomy in the left lower quadrant. 3. L3 and L5 pathologic compression fractures and additional suspicious osseous lesions, better evaluated on MR lumbar spine yesterday. 4. Trace right pleural fluid. Electronically Signed   By: Lorin Picket M.D.   On: 05/11/2022 08:55    Medications: I have reviewed the patient's current medications.   Assessment/Plan:  Colorectal cancer 02/08/2022 CT abdomen/pelvis without contrast-severe circumferential wall thickening of the distal sigmoid colon and rectum; enlarged perirectal and low left retroperitoneal lymph nodes; multiple ill-defined hypodense liver lesions; trace ascites in the low pelvis.  02/08/2022 Colonoscopy by Dr. Charlyne Mom almost completely obstructing medium-sized mass in the rectosigmoid colon measuring from 12  to 17 cm.  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  Foundation 1-microsatellite stable, tumor mutation burden 5, BRAFV600E, K-ras wild-type 02/09/2022 CEA 3.2 CT chest 02/18/2022-chest adenopathy.  Multiple hypodense liver lesions. 02/20/2022 biopsy of liver lesion-fragments of benign liver parenchyma with reactive changes.  No evidence of malignancy. MRI abdomen 03/01/2022-numerous hypovascular hepatic lesions having imaging characteristics consider diagnostic of widespread metastatic disease to the liver.  Retroperitoneal lymphadenopathy concerning for metastatic disease. Cycle 1 FOLFOX 03/02/2022 03/11/2022 CTs abdomen/pelvis-distal large bowel obstruction at the site of a large annular mass at the rectosigmoid junction.  Progressive liver metastases. Compared to 4/23 CT. Progressive perirectal, left periaortic and right pericardiophrenic nodal metastases.  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 Cycle 2 FOLFOX 03/31/2022 Cycle 3 FOLFOX 04/13/2022 Cycle 4 FOLFOX delayed 04/28/2022 secondary to neutropenia and thrombocytopenia MRI lumbar spine 05/07/2022-pathologic compression fractures at L1 and L3, multiple additional hypointense lesions concerning for metastatic disease CT abdomen/pelvis 05/08/2022-increase in size of liver metastases and pelvic/retroperitoneal adenopathy, stable prepericardial/juxta diaphragmatic nodes, new L5 metastasis, L1 and L3 compression fractures, L1 compression fracture present on 03/11/2022 CT Rectal bleeding, lower abdominal pain, constipation secondary to #1 Multiple family members with cancer Hypercholesterolemia Port-A-Cath placement 02/20/2022 Hospital admission 03/11/2022-colonic obstruction secondary to colon cancer. Neutropenia secondary to chemotherapy,   Disposition: Karen Kaufman has metastatic rectal cancer.  She has completed 4 cycles of FOLFOX.  She has tolerated the chemotherapy well.  She has pain secondary to  pathologic fractures at the lumbar spine.  She will begin a course of palliative radiation today.  We will check a plain x-ray of the  right hip and femur to be sure she does not have a lesion there.  I reviewed the restaging CT findings and images with Karen Kaufman and her husband.  There has been improvement in pelvic adenopathy and liver metastases.  There is a "new "lesion at L5.  The L1 compression fracture was present on the May CT.  I recommend continuing FOLFOX.  The plan is to resume FOLFOX at the completion of radiation.  She will return to the lab for a CBC and chemistry panel today.  She will continue hydrocodone as needed for pain.  She will begin palliative radiation today.  I recommend Zometa therapy.  We reviewed potential toxicities associated with Zometa including the chance of osteonecrosis.  We will be sure she has dental clearance for Zometa.  The plan is to begin Zometa when she returns for FOLFOX on 05/26/2022.  Betsy Coder, MD  05/11/2022  9:13 AM

## 2022-05-12 ENCOUNTER — Inpatient Hospital Stay: Payer: BC Managed Care – PPO

## 2022-05-12 ENCOUNTER — Other Ambulatory Visit: Payer: Self-pay

## 2022-05-12 ENCOUNTER — Inpatient Hospital Stay: Payer: BC Managed Care – PPO | Admitting: Oncology

## 2022-05-12 ENCOUNTER — Ambulatory Visit
Admission: RE | Admit: 2022-05-12 | Discharge: 2022-05-12 | Disposition: A | Discharge status: Home / Self Care | Payer: BC Managed Care – PPO | Source: Ambulatory Visit | Attending: Radiation Oncology | Admitting: Radiation Oncology

## 2022-05-12 ENCOUNTER — Encounter (HOSPITAL_COMMUNITY): Payer: Self-pay | Admitting: Nurse Practitioner

## 2022-05-12 DIAGNOSIS — Z510 Encounter for antineoplastic radiation therapy: Secondary | ICD-10-CM | POA: Diagnosis not present

## 2022-05-12 LAB — RAD ONC ARIA SESSION SUMMARY
Course Elapsed Days: 1
Plan Fractions Treated to Date: 2
Plan Prescribed Dose Per Fraction: 3 Gy
Plan Total Fractions Prescribed: 10
Plan Total Prescribed Dose: 30 Gy
Reference Point Dosage Given to Date: 6 Gy
Reference Point Session Dosage Given: 3 Gy
Session Number: 2

## 2022-05-13 ENCOUNTER — Other Ambulatory Visit: Payer: Self-pay

## 2022-05-13 ENCOUNTER — Ambulatory Visit
Admission: RE | Admit: 2022-05-13 | Discharge: 2022-05-13 | Disposition: A | Discharge status: Home / Self Care | Payer: BC Managed Care – PPO | Source: Ambulatory Visit | Attending: Radiation Oncology | Admitting: Radiation Oncology

## 2022-05-13 DIAGNOSIS — Z510 Encounter for antineoplastic radiation therapy: Secondary | ICD-10-CM | POA: Diagnosis not present

## 2022-05-13 LAB — RAD ONC ARIA SESSION SUMMARY
Course Elapsed Days: 2
Plan Fractions Treated to Date: 3
Plan Prescribed Dose Per Fraction: 3 Gy
Plan Total Fractions Prescribed: 10
Plan Total Prescribed Dose: 30 Gy
Reference Point Dosage Given to Date: 9 Gy
Reference Point Session Dosage Given: 3 Gy
Session Number: 3

## 2022-05-14 ENCOUNTER — Ambulatory Visit
Admission: RE | Admit: 2022-05-14 | Discharge: 2022-05-14 | Disposition: A | Discharge status: Home / Self Care | Payer: BC Managed Care – PPO | Source: Ambulatory Visit | Attending: Radiation Oncology | Admitting: Radiation Oncology

## 2022-05-14 ENCOUNTER — Other Ambulatory Visit: Payer: Self-pay

## 2022-05-14 ENCOUNTER — Inpatient Hospital Stay: Payer: BC Managed Care – PPO

## 2022-05-14 ENCOUNTER — Ambulatory Visit: Payer: BC Managed Care – PPO

## 2022-05-14 DIAGNOSIS — Z510 Encounter for antineoplastic radiation therapy: Secondary | ICD-10-CM | POA: Diagnosis not present

## 2022-05-14 LAB — RAD ONC ARIA SESSION SUMMARY
Course Elapsed Days: 3
Plan Fractions Treated to Date: 4
Plan Prescribed Dose Per Fraction: 3 Gy
Plan Total Fractions Prescribed: 10
Plan Total Prescribed Dose: 30 Gy
Reference Point Dosage Given to Date: 12 Gy
Reference Point Session Dosage Given: 3 Gy
Session Number: 4

## 2022-05-15 ENCOUNTER — Other Ambulatory Visit: Payer: Self-pay

## 2022-05-15 ENCOUNTER — Other Ambulatory Visit (HOSPITAL_BASED_OUTPATIENT_CLINIC_OR_DEPARTMENT_OTHER): Payer: BC Managed Care – PPO

## 2022-05-15 ENCOUNTER — Ambulatory Visit
Admission: RE | Admit: 2022-05-15 | Discharge: 2022-05-15 | Disposition: A | Discharge status: Home / Self Care | Payer: BC Managed Care – PPO | Source: Ambulatory Visit | Attending: Radiation Oncology | Admitting: Radiation Oncology

## 2022-05-15 ENCOUNTER — Ambulatory Visit: Payer: BC Managed Care – PPO | Admitting: Obstetrics and Gynecology

## 2022-05-15 ENCOUNTER — Inpatient Hospital Stay: Payer: BC Managed Care – PPO

## 2022-05-15 DIAGNOSIS — Z510 Encounter for antineoplastic radiation therapy: Secondary | ICD-10-CM | POA: Diagnosis not present

## 2022-05-15 LAB — RAD ONC ARIA SESSION SUMMARY
Course Elapsed Days: 4
Plan Fractions Treated to Date: 5
Plan Prescribed Dose Per Fraction: 3 Gy
Plan Total Fractions Prescribed: 10
Plan Total Prescribed Dose: 30 Gy
Reference Point Dosage Given to Date: 15 Gy
Reference Point Session Dosage Given: 3 Gy
Session Number: 5

## 2022-05-18 ENCOUNTER — Ambulatory Visit: Payer: BC Managed Care – PPO

## 2022-05-19 ENCOUNTER — Other Ambulatory Visit: Payer: Self-pay

## 2022-05-19 ENCOUNTER — Inpatient Hospital Stay: Payer: BC Managed Care – PPO

## 2022-05-19 ENCOUNTER — Ambulatory Visit
Admission: RE | Admit: 2022-05-19 | Discharge: 2022-05-19 | Disposition: A | Discharge status: Home / Self Care | Payer: BC Managed Care – PPO | Source: Ambulatory Visit | Attending: Radiation Oncology | Admitting: Radiation Oncology

## 2022-05-19 ENCOUNTER — Inpatient Hospital Stay: Payer: BC Managed Care – PPO | Admitting: Nurse Practitioner

## 2022-05-19 DIAGNOSIS — C20 Malignant neoplasm of rectum: Secondary | ICD-10-CM | POA: Insufficient documentation

## 2022-05-19 DIAGNOSIS — C7951 Secondary malignant neoplasm of bone: Secondary | ICD-10-CM | POA: Insufficient documentation

## 2022-05-19 DIAGNOSIS — Z510 Encounter for antineoplastic radiation therapy: Secondary | ICD-10-CM | POA: Diagnosis present

## 2022-05-19 LAB — RAD ONC ARIA SESSION SUMMARY
Course Elapsed Days: 8
Plan Fractions Treated to Date: 6
Plan Prescribed Dose Per Fraction: 3 Gy
Plan Total Fractions Prescribed: 10
Plan Total Prescribed Dose: 30 Gy
Reference Point Dosage Given to Date: 18 Gy
Reference Point Session Dosage Given: 3 Gy
Session Number: 6

## 2022-05-20 ENCOUNTER — Other Ambulatory Visit: Payer: Self-pay

## 2022-05-20 ENCOUNTER — Other Ambulatory Visit: Payer: Self-pay | Admitting: Nurse Practitioner

## 2022-05-20 ENCOUNTER — Ambulatory Visit
Admission: RE | Admit: 2022-05-20 | Discharge: 2022-05-20 | Disposition: A | Discharge status: Home / Self Care | Payer: BC Managed Care – PPO | Source: Ambulatory Visit | Attending: Radiation Oncology | Admitting: Radiation Oncology

## 2022-05-20 ENCOUNTER — Telehealth: Payer: Self-pay

## 2022-05-20 DIAGNOSIS — Z510 Encounter for antineoplastic radiation therapy: Secondary | ICD-10-CM | POA: Diagnosis not present

## 2022-05-20 DIAGNOSIS — C20 Malignant neoplasm of rectum: Secondary | ICD-10-CM

## 2022-05-20 LAB — RAD ONC ARIA SESSION SUMMARY
Course Elapsed Days: 9
Plan Fractions Treated to Date: 7
Plan Prescribed Dose Per Fraction: 3 Gy
Plan Total Fractions Prescribed: 10
Plan Total Prescribed Dose: 30 Gy
Reference Point Dosage Given to Date: 21 Gy
Reference Point Session Dosage Given: 3 Gy
Session Number: 7

## 2022-05-20 MED ORDER — HYDROCODONE-ACETAMINOPHEN 5-325 MG PO TABS: 1.0000 | ORAL_TABLET | ORAL | 0 refills | Status: DC | PRN

## 2022-05-20 NOTE — Telephone Encounter (Signed)
Patient called in and requested a refill of his Norco, the requested was place on the np's desk

## 2022-05-21 ENCOUNTER — Ambulatory Visit
Admission: RE | Admit: 2022-05-21 | Discharge: 2022-05-21 | Disposition: A | Discharge status: Home / Self Care | Payer: BC Managed Care – PPO | Source: Ambulatory Visit | Attending: Radiation Oncology | Admitting: Radiation Oncology

## 2022-05-21 ENCOUNTER — Other Ambulatory Visit: Payer: Self-pay

## 2022-05-21 ENCOUNTER — Inpatient Hospital Stay: Payer: BC Managed Care – PPO

## 2022-05-21 DIAGNOSIS — Z510 Encounter for antineoplastic radiation therapy: Secondary | ICD-10-CM | POA: Diagnosis not present

## 2022-05-21 LAB — RAD ONC ARIA SESSION SUMMARY
Course Elapsed Days: 10
Plan Fractions Treated to Date: 8
Plan Prescribed Dose Per Fraction: 3 Gy
Plan Total Fractions Prescribed: 10
Plan Total Prescribed Dose: 30 Gy
Reference Point Dosage Given to Date: 24 Gy
Reference Point Session Dosage Given: 3 Gy
Session Number: 8

## 2022-05-22 ENCOUNTER — Other Ambulatory Visit: Payer: Self-pay

## 2022-05-22 ENCOUNTER — Ambulatory Visit: Payer: BC Managed Care – PPO

## 2022-05-22 ENCOUNTER — Ambulatory Visit
Admission: RE | Admit: 2022-05-22 | Discharge: 2022-05-22 | Disposition: A | Discharge status: Home / Self Care | Payer: BC Managed Care – PPO | Source: Ambulatory Visit | Attending: Radiation Oncology | Admitting: Radiation Oncology

## 2022-05-22 DIAGNOSIS — Z510 Encounter for antineoplastic radiation therapy: Secondary | ICD-10-CM | POA: Diagnosis not present

## 2022-05-22 LAB — RAD ONC ARIA SESSION SUMMARY
Course Elapsed Days: 11
Plan Fractions Treated to Date: 9
Plan Prescribed Dose Per Fraction: 3 Gy
Plan Total Fractions Prescribed: 10
Plan Total Prescribed Dose: 30 Gy
Reference Point Dosage Given to Date: 27 Gy
Reference Point Session Dosage Given: 3 Gy
Session Number: 9

## 2022-05-24 ENCOUNTER — Other Ambulatory Visit: Payer: Self-pay | Admitting: Oncology

## 2022-05-25 ENCOUNTER — Ambulatory Visit: Payer: BC Managed Care – PPO

## 2022-05-25 ENCOUNTER — Encounter: Payer: Self-pay | Admitting: Urology

## 2022-05-25 ENCOUNTER — Other Ambulatory Visit: Payer: Self-pay

## 2022-05-25 ENCOUNTER — Ambulatory Visit
Admission: RE | Admit: 2022-05-25 | Discharge: 2022-05-25 | Disposition: A | Discharge status: Home / Self Care | Payer: BC Managed Care – PPO | Source: Ambulatory Visit | Attending: Radiation Oncology | Admitting: Radiation Oncology

## 2022-05-25 DIAGNOSIS — Z510 Encounter for antineoplastic radiation therapy: Secondary | ICD-10-CM | POA: Diagnosis not present

## 2022-05-25 DIAGNOSIS — C20 Malignant neoplasm of rectum: Secondary | ICD-10-CM

## 2022-05-25 LAB — RAD ONC ARIA SESSION SUMMARY
Course Elapsed Days: 14
Plan Fractions Treated to Date: 10
Plan Prescribed Dose Per Fraction: 3 Gy
Plan Total Fractions Prescribed: 10
Plan Total Prescribed Dose: 30 Gy
Reference Point Dosage Given to Date: 30 Gy
Reference Point Session Dosage Given: 3 Gy
Session Number: 10

## 2022-05-26 ENCOUNTER — Telehealth: Payer: Self-pay

## 2022-05-26 ENCOUNTER — Inpatient Hospital Stay: Payer: BC Managed Care – PPO

## 2022-05-26 ENCOUNTER — Encounter: Payer: Self-pay | Admitting: Nurse Practitioner

## 2022-05-26 ENCOUNTER — Inpatient Hospital Stay (HOSPITAL_BASED_OUTPATIENT_CLINIC_OR_DEPARTMENT_OTHER): Payer: BC Managed Care – PPO | Admitting: Nurse Practitioner

## 2022-05-26 ENCOUNTER — Inpatient Hospital Stay: Payer: BC Managed Care – PPO | Admitting: Oncology

## 2022-05-26 ENCOUNTER — Ambulatory Visit: Payer: BC Managed Care – PPO

## 2022-05-26 ENCOUNTER — Inpatient Hospital Stay: Payer: BC Managed Care – PPO | Attending: Nurse Practitioner

## 2022-05-26 ENCOUNTER — Encounter: Payer: Self-pay | Admitting: *Deleted

## 2022-05-26 VITALS — BP 121/72 | HR 73

## 2022-05-26 VITALS — BP 126/73 | HR 77 | Temp 98.2°F | Resp 18 | Ht 66.0 in | Wt 130.8 lb

## 2022-05-26 DIAGNOSIS — T451X5D Adverse effect of antineoplastic and immunosuppressive drugs, subsequent encounter: Secondary | ICD-10-CM | POA: Insufficient documentation

## 2022-05-26 DIAGNOSIS — Z5189 Encounter for other specified aftercare: Secondary | ICD-10-CM | POA: Diagnosis not present

## 2022-05-26 DIAGNOSIS — E7800 Pure hypercholesterolemia, unspecified: Secondary | ICD-10-CM | POA: Insufficient documentation

## 2022-05-26 DIAGNOSIS — Z5111 Encounter for antineoplastic chemotherapy: Secondary | ICD-10-CM | POA: Diagnosis present

## 2022-05-26 DIAGNOSIS — R1030 Lower abdominal pain, unspecified: Secondary | ICD-10-CM | POA: Insufficient documentation

## 2022-05-26 DIAGNOSIS — C20 Malignant neoplasm of rectum: Secondary | ICD-10-CM

## 2022-05-26 DIAGNOSIS — Z933 Colostomy status: Secondary | ICD-10-CM | POA: Insufficient documentation

## 2022-05-26 DIAGNOSIS — D701 Agranulocytosis secondary to cancer chemotherapy: Secondary | ICD-10-CM | POA: Insufficient documentation

## 2022-05-26 DIAGNOSIS — K625 Hemorrhage of anus and rectum: Secondary | ICD-10-CM | POA: Insufficient documentation

## 2022-05-26 LAB — CMP (CANCER CENTER ONLY)
ALT: 9 U/L (ref 0–44)
AST: 20 U/L (ref 15–41)
Albumin: 4 g/dL (ref 3.5–5.0)
Alkaline Phosphatase: 95 U/L (ref 38–126)
Anion gap: 12 (ref 5–15)
BUN: 5 mg/dL — ABNORMAL LOW (ref 8–23)
CO2: 27 mmol/L (ref 22–32)
Calcium: 9.4 mg/dL (ref 8.9–10.3)
Chloride: 101 mmol/L (ref 98–111)
Creatinine: 0.58 mg/dL (ref 0.44–1.00)
GFR, Estimated: >60 mL/min
Glucose, Bld: 95 mg/dL (ref 70–99)
Potassium: 3.5 mmol/L (ref 3.5–5.1)
Sodium: 140 mmol/L (ref 135–145)
Total Bilirubin: 0.6 mg/dL (ref 0.3–1.2)
Total Protein: 6.5 g/dL (ref 6.5–8.1)

## 2022-05-26 LAB — CEA (ACCESS): CEA (CHCC): 8.7 ng/mL — ABNORMAL HIGH (ref 0.00–5.00)

## 2022-05-26 LAB — CBC WITH DIFFERENTIAL (CANCER CENTER ONLY)
Abs Immature Granulocytes: 0.04 K/uL (ref 0.00–0.07)
Basophils Absolute: 0 K/uL (ref 0.0–0.1)
Basophils Relative: 1 %
Eosinophils Absolute: 0.9 K/uL — ABNORMAL HIGH (ref 0.0–0.5)
Eosinophils Relative: 21 %
HCT: 31.9 % — ABNORMAL LOW (ref 36.0–46.0)
Hemoglobin: 10.6 g/dL — ABNORMAL LOW (ref 12.0–15.0)
Immature Granulocytes: 1 %
Lymphocytes Relative: 9 %
Lymphs Abs: 0.4 K/uL — ABNORMAL LOW (ref 0.7–4.0)
MCH: 30.5 pg (ref 26.0–34.0)
MCHC: 33.2 g/dL (ref 30.0–36.0)
MCV: 91.9 fL (ref 80.0–100.0)
Monocytes Absolute: 0.3 K/uL (ref 0.1–1.0)
Monocytes Relative: 8 %
Neutro Abs: 2.7 K/uL (ref 1.7–7.7)
Neutrophils Relative %: 60 %
Platelet Count: 124 K/uL — ABNORMAL LOW (ref 150–400)
RBC: 3.47 MIL/uL — ABNORMAL LOW (ref 3.87–5.11)
RDW: 14.2 % (ref 11.5–15.5)
WBC Count: 4.5 K/uL (ref 4.0–10.5)
nRBC: 0 % (ref 0.0–0.2)

## 2022-05-26 MED ORDER — FLUOROURACIL CHEMO INJECTION 2.5 GM/50ML
400.0000 mg/m2 | Freq: Once | INTRAVENOUS | Status: AC
  Administered 2022-05-26: 700 mg via INTRAVENOUS
  Filled 2022-05-26: qty 14

## 2022-05-26 MED ORDER — OXALIPLATIN CHEMO INJECTION 100 MG/20ML
85.0000 mg/m2 | Freq: Once | INTRAVENOUS | Status: AC
  Administered 2022-05-26: 150 mg via INTRAVENOUS
  Filled 2022-05-26: qty 10

## 2022-05-26 MED ORDER — PALONOSETRON HCL INJECTION 0.25 MG/5ML
0.2500 mg | Freq: Once | INTRAVENOUS | Status: AC
  Administered 2022-05-26: 0.25 mg via INTRAVENOUS
  Filled 2022-05-26: qty 5

## 2022-05-26 MED ORDER — SODIUM CHLORIDE 0.9 % IV SOLN
10.0000 mg | Freq: Once | INTRAVENOUS | Status: AC
  Administered 2022-05-26: 10 mg via INTRAVENOUS
  Filled 2022-05-26: qty 10

## 2022-05-26 MED ORDER — LEUCOVORIN CALCIUM INJECTION 350 MG
400.0000 mg/m2 | Freq: Once | INTRAVENOUS | Status: AC
  Administered 2022-05-26: 696 mg via INTRAVENOUS
  Filled 2022-05-26: qty 17.5

## 2022-05-26 MED ORDER — SODIUM CHLORIDE 0.9 % IV SOLN
2400.0000 mg/m2 | INTRAVENOUS | Status: DC
  Administered 2022-05-26: 4200 mg via INTRAVENOUS
  Filled 2022-05-26: qty 84

## 2022-05-26 MED ORDER — DEXTROSE 5 % IV SOLN: Freq: Once | INTRAVENOUS | Status: AC

## 2022-05-26 MED ORDER — LEUCOVORIN CALCIUM INJECTION 350 MG
400.0000 mg/m2 | Freq: Once | INTRAVENOUS | Status: DC
  Filled 2022-05-26: qty 34.8

## 2022-05-26 NOTE — Progress Notes (Signed)
0.9% Sodium Chloride for 5FU CIV Lot:  [id number], Exp: 2/24  Adryana Mogensen Lynn, RPH, BCPS, BCOP 05/26/2022 2:09 PM 

## 2022-05-26 NOTE — Telephone Encounter (Signed)
Fax [phone] over a dental clearance to Dr. Hassell Done.

## 2022-05-26 NOTE — Progress Notes (Signed)
Patient seen by Ned Card NP today  Vitals are within treatment parameters.  Labs reviewed by Ned Card NP and are within treatment parameters.  Per physician team, patient is ready for treatment and there are NO modifications to the treatment plan. Will give Zometa on 05/28/22 after clearance received by Dr. Berdine Addison (dentist)-awaiting return call.

## 2022-05-26 NOTE — Telephone Encounter (Signed)
Routed a letter to Dr. Berdine Addison

## 2022-05-26 NOTE — Progress Notes (Signed)
[name] OFFICE PROGRESS NOTE   Diagnosis: Rectal cancer  INTERVAL HISTORY:   Karen Kaufman returns as scheduled.  She completed the course of palliative radiation 05/25/2022.  Back pain is better.  She notes she is utilizing less pain medication.  Energy level remains poor.  Appetite is "okay".  She had an episode of nausea/vomiting after the first day of radiation.  With subsequent radiation treatment she took Zofran and had no nausea or vomiting.  Colostomy functioning normally.  No numbness or tingling in the hands or feet.  Objective:  Vital signs in last 24 hours:  Blood pressure 126/73, pulse 77, temperature 98.2 F (36.8 C), temperature source Oral, resp. rate 18, height _0  (1.676 m), weight 130 lb 12.8 oz (59.3 kg), SpO2 100 %.    HEENT: No thrush or ulcers. Resp: Lungs clear bilaterally. Cardio: Regular rate and rhythm. GI: No hepatomegaly.  Left lower quadrant colostomy. Vascular: No leg edema. Port-A-Cath without erythema.  Lab Results:  Lab Results  Component Value Date   WBC 4.5 05/26/2022   HGB 10.6 (L) 05/26/2022   HCT 31.9 (L) 05/26/2022   MCV 91.9 05/26/2022   PLT 124 (L) 05/26/2022   NEUTROABS 2.7 05/26/2022    Imaging:  No results found.  Medications: I have reviewed the patient's current medications.  Assessment/Plan: Colorectal cancer 02/08/2022 CT abdomen/pelvis without contrast-severe circumferential wall thickening of the distal sigmoid colon and rectum; enlarged perirectal and low left retroperitoneal lymph nodes; multiple ill-defined hypodense liver lesions; trace ascites in the low pelvis.  02/08/2022 Colonoscopy by Dr. Charlyne Mom almost completely obstructing medium-sized mass in the rectosigmoid colon measuring from 12 to 17 cm.  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  Foundation 1-microsatellite stable, tumor mutation burden 5, BRAFV600E, K-ras wild-type 02/09/2022 CEA 3.2 CT chest  02/18/2022-chest adenopathy.  Multiple hypodense liver lesions. 02/20/2022 biopsy of liver lesion-fragments of benign liver parenchyma with reactive changes.  No evidence of malignancy. MRI abdomen 03/01/2022-numerous hypovascular hepatic lesions having imaging characteristics consider diagnostic of widespread metastatic disease to the liver.  Retroperitoneal lymphadenopathy concerning for metastatic disease. Cycle 1 FOLFOX 03/02/2022 03/11/2022 CTs abdomen/pelvis-distal large bowel obstruction at the site of a large annular mass at the rectosigmoid junction.  Progressive liver metastases. Compared to 4/23 CT. Progressive perirectal, left periaortic and right pericardiophrenic nodal metastases.  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 Cycle 2 FOLFOX 03/31/2022 Cycle 3 FOLFOX 04/13/2022 Cycle 4 FOLFOX delayed 04/28/2022 secondary to neutropenia and thrombocytopenia MRI lumbar spine 05/07/2022-pathologic compression fractures at L1 and L3, multiple additional hypointense lesions concerning for metastatic disease CT abdomen/pelvis 05/08/2022-increase in size of liver metastases and pelvic/retroperitoneal adenopathy, stable prepericardial/juxta diaphragmatic nodes, new L5 metastasis, L1 and L3 compression fractures, L1 compression fracture present on 03/11/2022 CT Cycle 4 FOLFOX 05/26/2022, Udenyca Rectal bleeding, lower abdominal pain, constipation secondary to #1 Multiple family members with cancer Hypercholesterolemia Port-A-Cath placement 02/20/2022 Hospital admission 03/11/2022-colonic obstruction secondary to colon cancer. Neutropenia secondary to chemotherapy      Disposition: Ms. Timmons appears stable.  She has completed 3 cycles of FOLFOX.  Cycle 4 was held 04/28/2022 due to neutropenia and thrombocytopenia.  She completed a course of palliative radiation to the lumbar spine yesterday.  Plan to resume treatment with FOLFOX today.  She will receive Udenyca on the day of pump discontinuation.   We reviewed potential side effects including bone pain, rash, splenic rupture.  She agrees to proceed.  CBC and chemistry panel from today reviewed.  Labs adequate  to proceed as above.  She is scheduled to begin Zometa today.  We have not received dental clearance.  We are contacting her dentist.  Zometa will be rescheduled to 05/28/2022.  We reviewed potential side effects associated with Zometa including flulike symptoms and osteonecrosis of the jaw.  She agrees to proceed once clearance has been obtained.  She will return for lab, follow-up, FOLFOX in 2 weeks.  We are available to see her sooner if needed.  Ned Card ANP/GNP-BC   05/26/2022  8:52 AM

## 2022-05-26 NOTE — Patient Instructions (Addendum)
[place]   The chemotherapy medication bag should finish at 46 hours, 96 hours, or 7 days. For example, if your pump is scheduled for 46 hours and it was put on at 4:00 p.m., it should finish at 2:00 p.m. the day it is scheduled to come off regardless of your appointment time.     Estimated time to finish at 11:45 Thursday, May 28, 2022.   If the display on your pump reads "Low Volume" and it is beeping, take the batteries out of the pump and come to the cancer center for it to be taken off.   If the pump alarms go off prior to the pump reading "Low Volume" then call [phone] and someone can assist you.  If the plunger comes out and the chemotherapy medication is leaking out, please use your home chemo spill kit to clean up the spill. Do NOT use paper towels or other household products.  If you have problems or questions regarding your pump, please call either [phone] (24 hours a day) or the cancer center Monday-Friday 8:00 a.m.- 4:30 p.m. at the clinic number and we will assist you. If you are unable to get assistance, then go to the nearest Emergency Department and ask the staff to contact the IV team for assistance.  Discharge Instructions: Thank you for choosing [facility] to provide your oncology and hematology care.   If you have a lab appointment with the [facility], please go directly to the [facility] and check in at the registration area.   Wear comfortable clothing and clothing appropriate for easy access to any Portacath or PICC line.   We strive to give you quality time with your provider. You may need to reschedule your appointment if you arrive late (15 or more minutes).  Arriving late affects you and other patients whose appointments are after yours.  Also, if you miss three or more appointments without notifying the office, you may be dismissed from the clinic at the provider's discretion.      For prescription  refill requests, have your pharmacy contact our office and allow 72 hours for refills to be completed.    Today you received the following chemotherapy and/or immunotherapy agents Oxaliplatin, Leucovorin, Fluorouracil.      To help prevent nausea and vomiting after your treatment, we encourage you to take your nausea medication as directed.  BELOW ARE SYMPTOMS THAT SHOULD BE REPORTED IMMEDIATELY: *FEVER GREATER THAN 100.4 F (38 C) OR HIGHER *CHILLS OR SWEATING *NAUSEA AND VOMITING THAT IS NOT CONTROLLED WITH YOUR NAUSEA MEDICATION *UNUSUAL SHORTNESS OF BREATH *UNUSUAL BRUISING OR BLEEDING *URINARY PROBLEMS (pain or burning when urinating, or frequent urination) *BOWEL PROBLEMS (unusual diarrhea, constipation, pain near the anus) TENDERNESS IN MOUTH AND THROAT WITH OR WITHOUT PRESENCE OF ULCERS (sore throat, sores in mouth, or a toothache) UNUSUAL RASH, SWELLING OR PAIN  UNUSUAL VAGINAL DISCHARGE OR ITCHING   Items with * indicate a potential emergency and should be followed up as soon as possible or go to the Emergency Department if any problems should occur.  Please show the CHEMOTHERAPY ALERT CARD or IMMUNOTHERAPY ALERT CARD at check-in to the Emergency Department and triage nurse.  Should you have questions after your visit or need to cancel or reschedule your appointment, please contact [facility]  Dept: [phone]  and follow the prompts.  Office hours are 8:00 a.m. to 4:30 p.m. Monday - Friday. Please note that voicemails  left after 4:00 p.m. may not be returned until the following business day.  We are closed weekends and major holidays. You have access to a nurse at all times for urgent questions. Please call the main number to the clinic Dept: [phone] and follow the prompts.   For any non-urgent questions, you may also contact your provider using MyChart. We now offer e-Visits for anyone 52 and older to request care online for non-urgent  symptoms. For details visit [email or web address].   Also download the MyChart app! Go to the app store, search "MyChart", open the app, select [facility], and log in with your MyChart username and password.  Masks are optional in the cancer centers. If you would like for your care team to wear a mask while they are taking care of you, please let them know. You may have one support person who is at least 62 years old accompany you for your appointments.  Oxaliplatin Injection What is this medication? OXALIPLATIN (ox AL i PLA tin) treats some types of cancer. It works by slowing down the growth of cancer cells. This medicine may be used for other purposes; ask your health care provider or pharmacist if you have questions. COMMON BRAND NAME(S): Eloxatin What should I tell my care team before I take this medication? They need to know if you have any of these conditions: Heart disease History of irregular heartbeat or rhythm Liver disease Low blood cell levels (white cells, red cells, and platelets) Lung or breathing disease, such as asthma Take medications that treat or prevent blood clots Tingling of the fingers, toes, or other nerve disorder An unusual or allergic reaction to oxaliplatin, other medications, foods, dyes, or preservatives If you or your partner are pregnant or trying to get pregnant Breast-feeding How should I use this medication? This medication is injected into a vein. It is given by your care team in a hospital or clinic setting. Talk to your care team about the use of this medication in children. Special care may be needed. Overdosage: If you think you have taken too much of this medicine contact a poison control center or emergency room at once. NOTE: This medicine is only for you. Do not share this medicine with others. What if I miss a dose? Keep appointments for follow-up doses. It is important not to miss a dose. Call your care team if you are unable to keep an  appointment. What may interact with this medication? Do not take this medication with any of the following: Cisapride Dronedarone Pimozide Thioridazine This medication may also interact with the following: Aspirin and aspirin-like medications Certain medications that treat or prevent blood clots, such as warfarin, apixaban, dabigatran, and rivaroxaban Cisplatin Cyclosporine Diuretics Medications for infection, such as acyclovir, adefovir, amphotericin B, bacitracin, cidofovir, foscarnet, ganciclovir, gentamicin, pentamidine, vancomycin NSAIDs, medications for pain and inflammation, such as ibuprofen or naproxen Other medications that cause heart rhythm changes Pamidronate Zoledronic acid This list may not describe all possible interactions. Give your health care provider a list of all the medicines, herbs, non-prescription drugs, or dietary supplements you use. Also tell them if you smoke, drink alcohol, or use illegal drugs. Some items may interact with your medicine. What should I watch for while using this medication? Your condition will be monitored carefully while you are receiving this medication. You may need blood work while taking this medication. This medication may make you feel generally unwell. This is not uncommon as chemotherapy can affect healthy cells as well as  cancer cells. Report any side effects. Continue your course of treatment even though you feel ill unless your care team tells you to stop. This medication may increase your risk of getting an infection. Call your care team for advice if you get a fever, chills, sore throat, or other symptoms of a cold or flu. Do not treat yourself. Try to avoid being around people who are sick. Avoid taking medications that contain aspirin, acetaminophen, ibuprofen, naproxen, or ketoprofen unless instructed by your care team. These medications may hide a fever. Be careful brushing or flossing your teeth or using a toothpick because  you may get an infection or bleed more easily. If you have any dental work done, tell your dentist you are receiving this medication. This medication can make you more sensitive to cold. Do not drink cold drinks or use ice. Cover exposed skin before coming in contact with cold temperatures or cold objects. When out in cold weather wear warm clothing and cover your mouth and nose to warm the air that goes into your lungs. Tell your care team if you get sensitive to the cold. Talk to your care team if you or your partner are pregnant or think either of you might be pregnant. This medication can cause serious birth defects if taken during pregnancy and for 9 months after the last dose. A negative pregnancy test is required before starting this medication. A reliable form of contraception is recommended while taking this medication and for 9 months after the last dose. Talk to your care team about effective forms of contraception. Do not father a child while taking this medication and for 6 months after the last dose. Use a condom while having sex during this time period. Do not breastfeed while taking this medication and for 3 months after the last dose. This medication may cause infertility. Talk to your care team if you are concerned about your fertility. What side effects may I notice from receiving this medication? Side effects that you should report to your care team as soon as possible: Allergic reactions--skin rash, itching, hives, swelling of the face, lips, tongue, or throat Bleeding--bloody or black, tar-like stools, vomiting blood or brown material that looks like coffee grounds, red or dark brown urine, small red or purple spots on skin, unusual bruising or bleeding Dry cough, shortness of breath or trouble breathing Heart rhythm changes--fast or irregular heartbeat, dizziness, feeling faint or lightheaded, chest pain, trouble breathing Infection--fever, chills, cough, sore throat, wounds that  don't heal, pain or trouble when passing urine, general feeling of discomfort or being unwell Liver injury--right upper belly pain, loss of appetite, nausea, light-colored stool, dark yellow or brown urine, yellowing skin or eyes, unusual weakness or fatigue Low red blood cell level--unusual weakness or fatigue, dizziness, headache, trouble breathing Muscle injury--unusual weakness or fatigue, muscle pain, dark yellow or brown urine, decrease in amount of urine Pain, tingling, or numbness in the hands or feet Sudden and severe headache, confusion, change in vision, seizures, which may be signs of posterior reversible encephalopathy syndrome (PRES) Unusual bruising or bleeding Side effects that usually do not require medical attention (report to your care team if they continue or are bothersome): Diarrhea Nausea Pain, redness, or swelling with sores inside the mouth or throat Unusual weakness or fatigue Vomiting This list may not describe all possible side effects. Call your doctor for medical advice about side effects. You may report side effects to FDA at 1-800-FDA-1088. Where should I keep my medication?   This medication is given in a hospital or clinic. It will not be stored at home. NOTE: This sheet is a summary. It may not cover all possible information. If you have questions about this medicine, talk to your doctor, pharmacist, or health care provider.  2023 Elsevier/Gold Standard (2022-01-30 00:00:00)  Leucovorin Injection What is this medication? LEUCOVORIN (loo koe VOR in) prevents side effects from certain medications, such as methotrexate. It works by increasing folate levels. This helps protect healthy cells in your body. It may also be used to treat anemia caused by low levels of folate. It can also be used with fluorouracil, a type of chemotherapy, to treat colorectal cancer. It works by increasing the effects of fluorouracil in the body. This medicine may be used for other  purposes; ask your health care provider or pharmacist if you have questions. What should I tell my care team before I take this medication? They need to know if you have any of these conditions: Anemia from low levels of vitamin B12 in the blood An unusual or allergic reaction to leucovorin, folic acid, other medications, foods, dyes, or preservatives Pregnant or trying to get pregnant Breastfeeding How should I use this medication? This medication is injected into a vein or a muscle. It is given by your care team in a hospital or clinic setting. Talk to your care team about the use of this medication in children. Special care may be needed. Overdosage: If you think you have taken too much of this medicine contact a poison control center or emergency room at once. NOTE: This medicine is only for you. Do not share this medicine with others. What if I miss a dose? Keep appointments for follow-up doses. It is important not to miss your dose. Call your care team if you are unable to keep an appointment. What may interact with this medication? Capecitabine Fluorouracil Phenobarbital Phenytoin Primidone Trimethoprim;sulfamethoxazole This list may not describe all possible interactions. Give your health care provider a list of all the medicines, herbs, non-prescription drugs, or dietary supplements you use. Also tell them if you smoke, drink alcohol, or use illegal drugs. Some items may interact with your medicine. What should I watch for while using this medication? Your condition will be monitored carefully while you are receiving this medication. This medication may increase the side effects of 5-fluorouracil. Tell your care team if you have diarrhea or mouth sores that do not get better or that get worse. What side effects may I notice from receiving this medication? Side effects that you should report to your care team as soon as possible: Allergic reactions--skin rash, itching, hives,  swelling of the face, lips, tongue, or throat This list may not describe all possible side effects. Call your doctor for medical advice about side effects. You may report side effects to FDA at 1-800-FDA-1088. Where should I keep my medication? This medication is given in a hospital or clinic. It will not be stored at home. NOTE: This sheet is a summary. It may not cover all possible information. If you have questions about this medicine, talk to your doctor, pharmacist, or health care provider.  2023 Elsevier/Gold Standard (2022-02-13 00:00:00)  Fluorouracil Injection What is this medication? FLUOROURACIL (flure oh YOOR a sil) treats some types of cancer. It works by slowing down the growth of cancer cells. This medicine may be used for other purposes; ask your health care provider or pharmacist if you have questions. COMMON BRAND NAME(S): Adrucil What should I tell  my care team before I take this medication? They need to know if you have any of these conditions: Blood disorders Dihydropyrimidine dehydrogenase (DPD) deficiency Infection, such as chickenpox, cold sores, herpes Kidney disease Liver disease Poor nutrition Recent or ongoing radiation therapy An unusual or allergic reaction to fluorouracil, other medications, foods, dyes, or preservatives If you or your partner are pregnant or trying to get pregnant Breast-feeding How should I use this medication? This medication is injected into a vein. It is administered by your care team in a hospital or clinic setting. Talk to your care team about the use of this medication in children. Special care may be needed. Overdosage: If you think you have taken too much of this medicine contact a poison control center or emergency room at once. NOTE: This medicine is only for you. Do not share this medicine with others. What if I miss a dose? Keep appointments for follow-up doses. It is important not to miss your dose. Call your care team if  you are unable to keep an appointment. What may interact with this medication? Do not take this medication with any of the following: Live virus vaccines This medication may also interact with the following: Medications that treat or prevent blood clots, such as warfarin, enoxaparin, dalteparin This list may not describe all possible interactions. Give your health care provider a list of all the medicines, herbs, non-prescription drugs, or dietary supplements you use. Also tell them if you smoke, drink alcohol, or use illegal drugs. Some items may interact with your medicine. What should I watch for while using this medication? Your condition will be monitored carefully while you are receiving this medication. This medication may make you feel generally unwell. This is not uncommon as chemotherapy can affect healthy cells as well as cancer cells. Report any side effects. Continue your course of treatment even though you feel ill unless your care team tells you to stop. In some cases, you may be given additional medications to help with side effects. Follow all directions for their use. This medication may increase your risk of getting an infection. Call your care team for advice if you get a fever, chills, sore throat, or other symptoms of a cold or flu. Do not treat yourself. Try to avoid being around people who are sick. This medication may increase your risk to bruise or bleed. Call your care team if you notice any unusual bleeding. Be careful brushing or flossing your teeth or using a toothpick because you may get an infection or bleed more easily. If you have any dental work done, tell your dentist you are receiving this medication. Avoid taking medications that contain aspirin, acetaminophen, ibuprofen, naproxen, or ketoprofen unless instructed by your care team. These medications may hide a fever. Do not treat diarrhea with over the counter products. Contact your care team if you have diarrhea  that lasts more than 2 days or if it is severe and watery. This medication can make you more sensitive to the sun. Keep out of the sun. If you cannot avoid being in the sun, wear protective clothing and sunscreen. Do not use sun lamps, tanning beds, or tanning booths. Talk to your care team if you or your partner wish to become pregnant or think you might be pregnant. This medication can cause serious birth defects if taken during pregnancy and for 3 months after the last dose. A reliable form of contraception is recommended while taking this medication and for 3 months after  the last dose. Talk to your care team about effective forms of contraception. Do not father a child while taking this medication and for 3 months after the last dose. Use a condom while having sex during this time period. Do not breastfeed while taking this medication. This medication may cause infertility. Talk to your care team if you are concerned about your fertility. What side effects may I notice from receiving this medication? Side effects that you should report to your care team as soon as possible: Allergic reactions--skin rash, itching, hives, swelling of the face, lips, tongue, or throat Heart attack--pain or tightness in the chest, shoulders, arms, or jaw, nausea, shortness of breath, cold or clammy skin, feeling faint or lightheaded Heart failure--shortness of breath, swelling of the ankles, feet, or hands, sudden weight gain, unusual weakness or fatigue Heart rhythm changes--fast or irregular heartbeat, dizziness, feeling faint or lightheaded, chest pain, trouble breathing High ammonia level--unusual weakness or fatigue, confusion, loss of appetite, nausea, vomiting, seizures Infection--fever, chills, cough, sore throat, wounds that don't heal, pain or trouble when passing urine, general feeling of discomfort or being unwell Low red blood cell level--unusual weakness or fatigue, dizziness, headache, trouble  breathing Pain, tingling, or numbness in the hands or feet, muscle weakness, change in vision, confusion or trouble speaking, loss of balance or coordination, trouble walking, seizures Redness, swelling, and blistering of the skin over hands and feet Severe or prolonged diarrhea Unusual bruising or bleeding Side effects that usually do not require medical attention (report to your care team if they continue or are bothersome): Dry skin Headache Increased tears Nausea Pain, redness, or swelling with sores inside the mouth or throat Sensitivity to light Vomiting This list may not describe all possible side effects. Call your doctor for medical advice about side effects. You may report side effects to FDA at 1-800-FDA-1088. Where should I keep my medication? This medication is given in a hospital or clinic. It will not be stored at home. NOTE: This sheet is a summary. It may not cover all possible information. If you have questions about this medicine, talk to your doctor, pharmacist, or health care provider.  2023 Elsevier/Gold Standard (2022-02-10 00:00:00)

## 2022-05-27 ENCOUNTER — Ambulatory Visit: Payer: BC Managed Care – PPO

## 2022-05-28 ENCOUNTER — Inpatient Hospital Stay: Payer: BC Managed Care – PPO

## 2022-05-28 VITALS — BP 108/67 | HR 82 | Temp 97.8°F | Resp 18

## 2022-05-28 DIAGNOSIS — C7951 Secondary malignant neoplasm of bone: Secondary | ICD-10-CM

## 2022-05-28 DIAGNOSIS — C20 Malignant neoplasm of rectum: Secondary | ICD-10-CM

## 2022-05-28 DIAGNOSIS — Z5111 Encounter for antineoplastic chemotherapy: Secondary | ICD-10-CM | POA: Diagnosis not present

## 2022-05-28 MED ORDER — PEGFILGRASTIM-CBQV 6 MG/0.6ML ~~LOC~~ SOSY
6.0000 mg | PREFILLED_SYRINGE | Freq: Once | SUBCUTANEOUS | Status: AC
  Administered 2022-05-28: 6 mg via SUBCUTANEOUS

## 2022-05-28 MED ORDER — SODIUM CHLORIDE 0.9 % IV SOLN: Freq: Once | INTRAVENOUS | Status: AC

## 2022-05-28 MED ORDER — ZOLEDRONIC ACID 4 MG/100ML IV SOLN
4.0000 mg | Freq: Once | INTRAVENOUS | Status: AC
  Administered 2022-05-28: 4 mg via INTRAVENOUS
  Filled 2022-05-28: qty 100

## 2022-05-28 MED ORDER — HEPARIN SOD (PORK) LOCK FLUSH 100 UNIT/ML IV SOLN
500.0000 [IU] | Freq: Once | INTRAVENOUS | Status: AC | PRN
  Administered 2022-05-28: 500 [IU]

## 2022-05-28 MED ORDER — SODIUM CHLORIDE 0.9% FLUSH
10.0000 mL | INTRAVENOUS | Status: DC | PRN
  Administered 2022-05-28: 10 mL

## 2022-05-28 NOTE — Patient Instructions (Signed)
Zoledronic Acid Injection (Cancer) What is this medication? ZOLEDRONIC ACID (ZOE le dron ik AS id) treats high calcium levels in the blood caused by cancer. It may also be used with chemotherapy to treat weakened bones caused by cancer. It works by slowing down the release of calcium from bones. This lowers calcium levels in your blood. It also makes your bones stronger and less likely to break (fracture). It belongs to a group of medications called bisphosphonates. This medicine may be used for other purposes; ask your health care provider or pharmacist if you have questions. COMMON BRAND NAME(S): Zometa, Zometa Powder What should I tell my care team before I take this medication? They need to know if you have any of these conditions: Dehydration Dental disease Kidney disease Liver disease Low levels of calcium in the blood Lung or breathing disease, such as asthma Receiving steroids, such as dexamethasone or prednisone An unusual or allergic reaction to zoledronic acid, other medications, foods, dyes, or preservatives Pregnant or trying to get pregnant Breast-feeding How should I use this medication? This medication is injected into a vein. It is given by your care team in a hospital or clinic setting. Talk to your care team about the use of this medication in children. Special care may be needed. Overdosage: If you think you have taken too much of this medicine contact a poison control center or emergency room at once. NOTE: This medicine is only for you. Do not share this medicine with others. What if I miss a dose? Keep appointments for follow-up doses. It is important not to miss your dose. Call your care team if you are unable to keep an appointment. What may interact with this medication? Certain antibiotics given by injection Diuretics, such as bumetanide, furosemide NSAIDs, medications for pain and inflammation, such as ibuprofen or naproxen Teriparatide Thalidomide This list  may not describe all possible interactions. Give your health care provider a list of all the medicines, herbs, non-prescription drugs, or dietary supplements you use. Also tell them if you smoke, drink alcohol, or use illegal drugs. Some items may interact with your medicine. What should I watch for while using this medication? Visit your care team for regular checks on your progress. It may be some time before you see the benefit from this medication. Some people who take this medication have severe bone, joint, or muscle pain. This medication may also increase your risk for jaw problems or a broken thigh bone. Tell your care team right away if you have severe pain in your jaw, bones, joints, or muscles. Tell you care team if you have any pain that does not go away or that gets worse. Tell your dentist and dental surgeon that you are taking this medication. You should not have major dental surgery while on this medication. See your dentist to have a dental exam and fix any dental problems before starting this medication. Take good care of your teeth while on this medication. Make sure you see your dentist for regular follow-up appointments. You should make sure you get enough calcium and vitamin D while you are taking this medication. Discuss the foods you eat and the vitamins you take with your care team. Check with your care team if you have severe diarrhea, nausea, and vomiting, or if you sweat a lot. The loss of too much body fluid may make it dangerous for you to take this medication. You may need bloodwork while taking this medication. Talk to your care team if   you wish to become pregnant or think you might be pregnant. This medication can cause serious birth defects. What side effects may I notice from receiving this medication? Side effects that you should report to your care team as soon as possible: Allergic reactions--skin rash, itching, hives, swelling of the face, lips, tongue, or  throat Kidney injury--decrease in the amount of urine, swelling of the ankles, hands, or feet Low calcium level--muscle pain or cramps, confusion, tingling, or numbness in the hands or feet Osteonecrosis of the jaw--pain, swelling, or redness in the mouth, numbness of the jaw, poor healing after dental work, unusual discharge from the mouth, visible bones in the mouth Severe bone, joint, or muscle pain Side effects that usually do not require medical attention (report to your care team if they continue or are bothersome): Constipation Fatigue Fever Loss of appetite Nausea Stomach pain This list may not describe all possible side effects. Call your doctor for medical advice about side effects. You may report side effects to FDA at 1-800-FDA-1088. Where should I keep my medication? This medication is given in a hospital or clinic. It will not be stored at home. NOTE: This sheet is a summary. It may not cover all possible information. If you have questions about this medicine, talk to your doctor, pharmacist, or health care provider.  2023 Elsevier/Gold Standard (2021-11-20 00:00:00)  Pegfilgrastim Injection What is this medication? PEGFILGRASTIM (PEG fil gra stim) lowers the risk of infection in people who are receiving chemotherapy. It works by Building control surveyor make more white blood cells, which protects your body from infection. It may also be used to help people who have been exposed to high doses of radiation. This medicine may be used for other purposes; ask your health care provider or pharmacist if you have questions. COMMON BRAND NAME(S): [place] [employer], Neulasta, Nyvepria, Stimufend, UDENYCA, Ziextenzo What should I tell my care team before I take this medication? They need to know if you have any of these conditions: Kidney disease Latex allergy Ongoing radiation therapy Sickle cell disease Skin reactions to acrylic adhesives (On-Body Injector only) An unusual or allergic  reaction to pegfilgrastim, filgrastim, other medications, foods, dyes, or preservatives Pregnant or trying to get pregnant Breast-feeding How should I use this medication? This medication is for injection under the skin. If you get this medication at home, you will be taught how to prepare and give the pre-filled syringe or how to use the On-body Injector. Refer to the patient Instructions for Use for detailed instructions. Use exactly as directed. Tell your care team immediately if you suspect that the On-body Injector may not have performed as intended or if you suspect the use of the On-body Injector resulted in a missed or partial dose. It is important that you put your used needles and syringes in a special sharps container. Do not put them in a trash can. If you do not have a sharps container, call your pharmacist or care team to get one. Talk to your care team about the use of this medication in children. While this medication may be prescribed for selected conditions, precautions do apply. Overdosage: If you think you have taken too much of this medicine contact a poison control center or emergency room at once. NOTE: This medicine is only for you. Do not share this medicine with others. What if I miss a dose? It is important not to miss your dose. Call your care team if you miss your dose. If you miss a  dose due to an On-body Injector failure or leakage, a new dose should be administered as soon as possible using a single prefilled syringe for manual use. What may interact with this medication? Interactions have not been studied. This list may not describe all possible interactions. Give your health care provider a list of all the medicines, herbs, non-prescription drugs, or dietary supplements you use. Also tell them if you smoke, drink alcohol, or use illegal drugs. Some items may interact with your medicine. What should I watch for while using this medication? Your condition will be  monitored carefully while you are receiving this medication. You may need blood work done while you are taking this medication. Talk to your care team about your risk of cancer. You may be more at risk for certain types of cancer if you take this medication. If you are going to need a MRI, CT scan, or other procedure, tell your care team that you are using this medication (On-Body Injector only). What side effects may I notice from receiving this medication? Side effects that you should report to your care team as soon as possible: Allergic reactions--skin rash, itching, hives, swelling of the face, lips, tongue, or throat Capillary leak syndrome--stomach or muscle pain, unusual weakness or fatigue, feeling faint or lightheaded, decrease in the amount of urine, swelling of the ankles, hands, or feet, trouble breathing High white blood cell level--fever, fatigue, trouble breathing, night sweats, change in vision, weight loss Inflammation of the aorta--fever, fatigue, back, chest, or stomach pain, severe headache Kidney injury (glomerulonephritis)--decrease in the amount of urine, red or dark brown urine, foamy or bubbly urine, swelling of the ankles, hands, or feet Shortness of breath or trouble breathing Spleen injury--pain in upper left stomach or shoulder Unusual bruising or bleeding Side effects that usually do not require medical attention (report to your care team if they continue or are bothersome): Bone pain Pain in the hands or feet This list may not describe all possible side effects. Call your doctor for medical advice about side effects. You may report side effects to FDA at 1-800-FDA-1088. Where should I keep my medication? Keep out of the reach of children. If you are using this medication at home, you will be instructed on how to store it. Throw away any unused medication after the expiration date on the label. NOTE: This sheet is a summary. It may not cover all possible  information. If you have questions about this medicine, talk to your doctor, pharmacist, or health care provider.  2023 Elsevier/Gold Standard (2014-01-05 00:00:00)

## 2022-06-02 ENCOUNTER — Telehealth: Payer: Self-pay

## 2022-06-02 NOTE — Telephone Encounter (Signed)
TC from Pt inquiring if she should be feeling pain from injection informed Pt that the injection she received Karen Kaufman) does cause bone pain. Pt also stated she is having stomach pain and is Nausea and vomiting. Informed Pt to take the zofran under her tongue. And try small sips of water and Gatorade. Informed Pt that she could rotate her antinausea medications. Pt and to try crackers and toast. Informed Pt I would return the call to see how she is feeling. Returned call to Pt at 1510 Pt stated she was feeling better she is drinking the Gatorade and water and will take the compazine in a couple of hours. Discussed with Pt to try soup and crackers first and then move to more solid foods once she gets the nausea under control. Informed the Pt to return the call to the office if anything should change. Pt verbalized understanding.

## 2022-06-03 ENCOUNTER — Telehealth: Payer: Self-pay

## 2022-06-03 NOTE — Telephone Encounter (Signed)
TC to Pt to see how she is doing today. Pt stated she was feeling much better after rotating the antinausea medications. She stated she has been sipping on Gatorade and water, and had soup which stayed down last night and had half a sandwich today.Informed Pt if anything should change to give a return call to the office. Pt verbalized understanding.

## 2022-06-07 ENCOUNTER — Other Ambulatory Visit: Payer: Self-pay | Admitting: Oncology

## 2022-06-07 DIAGNOSIS — C20 Malignant neoplasm of rectum: Secondary | ICD-10-CM

## 2022-06-07 NOTE — Progress Notes (Signed)
[facility] - Colorectal  No Change  Continue With Treatment as Ordered.  Original Decision Date/Time: 02/25/2022 15:26     A cycle is every 14 days:     Oxaliplatin      Leucovorin      Fluorouracil      Fluorouracil   **Always confirm dose/schedule in your pharmacy ordering system**  Patient Characteristics: Distant Metastases, Nonsurgical Candidate, KRAS/NRAS Mutation Positive/Unknown (BRAF V600 Wild-Type/Unknown), Standard Cytotoxic Therapy, First Line Standard Cytotoxic Therapy, Bevacizumab Ineligible, PS = 0,1 Tumor Location: Rectal Therapeutic Status: Distant Metastases Microsatellite/Mismatch Repair Status: Unknown BRAF Mutation Status: Awaiting Test Results KRAS/NRAS Mutation Status: Awaiting Test Results Standard Cytotoxic Line of Therapy: First Line Standard Cytotoxic Therapy ECOG Performance Status: 1 Bevacizumab Eligibility: Ineligible Intent of Therapy: Non-Curative / Palliative Intent, Discussed with Patient

## 2022-06-08 ENCOUNTER — Telehealth: Payer: Self-pay

## 2022-06-08 NOTE — Telephone Encounter (Signed)
Patient called in states she's having bone  and joint pain from the Zometa. Patient last treatment was on the 05/26/22 advice the patient to take acetaminophen and stay well hydrated. Patient expresses understanding and had no further questions or concerns.

## 2022-06-10 ENCOUNTER — Encounter: Payer: Self-pay | Admitting: Nurse Practitioner

## 2022-06-10 ENCOUNTER — Encounter: Payer: Self-pay | Admitting: *Deleted

## 2022-06-10 ENCOUNTER — Inpatient Hospital Stay: Payer: BC Managed Care – PPO

## 2022-06-10 ENCOUNTER — Other Ambulatory Visit: Payer: Self-pay

## 2022-06-10 ENCOUNTER — Inpatient Hospital Stay (HOSPITAL_BASED_OUTPATIENT_CLINIC_OR_DEPARTMENT_OTHER): Payer: BC Managed Care – PPO | Admitting: Nurse Practitioner

## 2022-06-10 VITALS — BP 100/65 | HR 68 | Resp 18

## 2022-06-10 VITALS — BP 111/65 | HR 80 | Temp 97.9°F | Resp 18 | Ht 66.0 in | Wt 127.0 lb

## 2022-06-10 DIAGNOSIS — Z5111 Encounter for antineoplastic chemotherapy: Secondary | ICD-10-CM | POA: Diagnosis not present

## 2022-06-10 DIAGNOSIS — C20 Malignant neoplasm of rectum: Secondary | ICD-10-CM | POA: Diagnosis not present

## 2022-06-10 LAB — CMP (CANCER CENTER ONLY)
ALT: 56 U/L — ABNORMAL HIGH (ref 0–44)
AST: 73 U/L — ABNORMAL HIGH (ref 15–41)
Albumin: 3.3 g/dL — ABNORMAL LOW (ref 3.5–5.0)
Alkaline Phosphatase: 139 U/L — ABNORMAL HIGH (ref 38–126)
Anion gap: 8 (ref 5–15)
BUN: 6 mg/dL — ABNORMAL LOW (ref 8–23)
CO2: 29 mmol/L (ref 22–32)
Calcium: 8.4 mg/dL — ABNORMAL LOW (ref 8.9–10.3)
Chloride: 102 mmol/L (ref 98–111)
Creatinine: 0.52 mg/dL (ref 0.44–1.00)
GFR, Estimated: >60 mL/min (ref >60)
Glucose, Bld: 113 mg/dL — ABNORMAL HIGH (ref 70–99)
Potassium: 3.5 mmol/L (ref 3.5–5.1)
Sodium: 139 mmol/L (ref 135–145)
Total Bilirubin: 0.5 mg/dL (ref 0.3–1.2)
Total Protein: 5.4 g/dL — ABNORMAL LOW (ref 6.5–8.1)

## 2022-06-10 LAB — CBC WITH DIFFERENTIAL (CANCER CENTER ONLY)
Abs Immature Granulocytes: 0.11 10*3/uL — ABNORMAL HIGH (ref 0.00–0.07)
Basophils Absolute: 0 10*3/uL (ref 0.0–0.1)
Basophils Relative: 0 %
Eosinophils Absolute: 0.5 10*3/uL (ref 0.0–0.5)
Eosinophils Relative: 9 %
HCT: 31.7 % — ABNORMAL LOW (ref 36.0–46.0)
Hemoglobin: 10.6 g/dL — ABNORMAL LOW (ref 12.0–15.0)
Immature Granulocytes: 2 %
Lymphocytes Relative: 8 %
Lymphs Abs: 0.5 10*3/uL — ABNORMAL LOW (ref 0.7–4.0)
MCH: 30.6 pg (ref 26.0–34.0)
MCHC: 33.4 g/dL (ref 30.0–36.0)
MCV: 91.6 fL (ref 80.0–100.0)
Monocytes Absolute: 0.5 10*3/uL (ref 0.1–1.0)
Monocytes Relative: 8 %
Neutro Abs: 4.6 10*3/uL (ref 1.7–7.7)
Neutrophils Relative %: 73 %
Platelet Count: 149 10*3/uL — ABNORMAL LOW (ref 150–400)
RBC: 3.46 MIL/uL — ABNORMAL LOW (ref 3.87–5.11)
RDW: 15 % (ref 11.5–15.5)
WBC Count: 6.3 10*3/uL (ref 4.0–10.5)
nRBC: 0 % (ref 0.0–0.2)

## 2022-06-10 MED ORDER — ONDANSETRON HCL 8 MG PO TABS: 8.0000 mg | ORAL_TABLET | Freq: Three times a day (TID) | ORAL | 0 refills | Status: DC | PRN

## 2022-06-10 MED ORDER — OXALIPLATIN CHEMO INJECTION 100 MG/20ML
85.0000 mg/m2 | Freq: Once | INTRAVENOUS | Status: AC
  Administered 2022-06-10: 140 mg via INTRAVENOUS
  Filled 2022-06-10: qty 20

## 2022-06-10 MED ORDER — HYDROCODONE-ACETAMINOPHEN 5-325 MG PO TABS: 1.0000 | ORAL_TABLET | ORAL | 0 refills | Status: DC | PRN

## 2022-06-10 MED ORDER — PALONOSETRON HCL INJECTION 0.25 MG/5ML
0.2500 mg | Freq: Once | INTRAVENOUS | Status: AC
  Administered 2022-06-10: 0.25 mg via INTRAVENOUS
  Filled 2022-06-10: qty 5

## 2022-06-10 MED ORDER — DEXTROSE 5 % IV SOLN: Freq: Once | INTRAVENOUS | Status: AC

## 2022-06-10 MED ORDER — LEUCOVORIN CALCIUM INJECTION 350 MG
400.0000 mg/m2 | Freq: Once | INTRAVENOUS | Status: AC
  Administered 2022-06-10: 664 mg via INTRAVENOUS
  Filled 2022-06-10: qty 33.2

## 2022-06-10 MED ORDER — SODIUM CHLORIDE 0.9 % IV SOLN
2400.0000 mg/m2 | INTRAVENOUS | Status: DC
  Administered 2022-06-10: 4000 mg via INTRAVENOUS
  Filled 2022-06-10: qty 80

## 2022-06-10 MED ORDER — SODIUM CHLORIDE 0.9 % IV SOLN
10.0000 mg | Freq: Once | INTRAVENOUS | Status: AC
  Administered 2022-06-10: 10 mg via INTRAVENOUS
  Filled 2022-06-10: qty 10

## 2022-06-10 MED ORDER — DEXAMETHASONE 4 MG PO TABS: 4.0000 mg | ORAL_TABLET | Freq: Two times a day (BID) | ORAL | 0 refills | Status: DC

## 2022-06-10 MED ORDER — SODIUM CHLORIDE 0.9 % IV SOLN
150.0000 mg | Freq: Once | INTRAVENOUS | Status: AC
  Administered 2022-06-10: 150 mg via INTRAVENOUS
  Filled 2022-06-10: qty 150

## 2022-06-10 MED ORDER — FLUOROURACIL CHEMO INJECTION 2.5 GM/50ML
400.0000 mg/m2 | Freq: Once | INTRAVENOUS | Status: AC
  Administered 2022-06-10: 650 mg via INTRAVENOUS
  Filled 2022-06-10: qty 13

## 2022-06-10 NOTE — Progress Notes (Signed)
Patient seen by Ned Card NP today  Vitals are within treatment parameters.  Labs reviewed by Ned Card NP and are within treatment parameters. Aware of increase in liver functions-due to Oxaliplatin  Per physician team, patient is ready for treatment and there are NO modifications to the treatment plan.

## 2022-06-10 NOTE — Progress Notes (Addendum)
Karen Kaufman OFFICE PROGRESS NOTE   Diagnosis:  Rectal cancer  INTERVAL HISTORY:   Karen Kaufman returns as scheduled. She completed cycle 4 FOLFOX 05/26/2022.  She developed nausea/vomiting around day 4.  This lasted 2 days.  Zofran helped.  No mouth sores.  No diarrhea.  Cold sensitivity lasted about 5 days.  No numbness or tingling at present.  She reports 2 falls since her last office visit.  Legs "gave out".  She notes left leg weakness over the past 2 weeks.  Back pain continues to be improved.  No urinary difficulty.  No numbness or tingling in the legs.  Objective:  Vital signs in last 24 hours:  Blood pressure 111/65, pulse 80, temperature 97.9 F (36.6 C), temperature source Oral, resp. rate 18, height '5\' 6"'  (1.676 m), weight 127 lb (57.6 kg), SpO2 100 %.    HEENT: No thrush or ulcers. Resp: Lungs clear bilaterally. Cardio: Regular rate and rhythm. GI: Abdomen soft and nontender.  No hepatomegaly.  Left lower quadrant colostomy. Vascular: No leg edema. Neuro: Weakness throughout the left leg, most noticeable proximally.  Knee DTRs intact.  Right leg motor strength intact. Skin: No rash. Port-A-Cath without erythema.   Lab Results:  Lab Results  Component Value Date   WBC 6.3 06/10/2022   HGB 10.6 (L) 06/10/2022   HCT 31.7 (L) 06/10/2022   MCV 91.6 06/10/2022   PLT 149 (L) 06/10/2022   NEUTROABS 4.6 06/10/2022    Imaging:  No results found.  Medications: I have reviewed the patient's current medications.  Assessment/Plan: Colorectal cancer 02/08/2022 CT abdomen/pelvis without contrast-severe circumferential wall thickening of the distal sigmoid colon and rectum; enlarged perirectal and low left retroperitoneal lymph nodes; multiple ill-defined hypodense liver lesions; trace ascites in the low pelvis.  02/08/2022 Colonoscopy by Dr. Charlyne Mom almost completely obstructing medium-sized mass in the rectosigmoid colon measuring from 12 to 17 cm.   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  Foundation 1-microsatellite stable, tumor mutation burden 5, BRAFV600E, K-ras wild-type 02/09/2022 CEA 3.2 CT chest 02/18/2022-chest adenopathy.  Multiple hypodense liver lesions. 02/20/2022 biopsy of liver lesion-fragments of benign liver parenchyma with reactive changes.  No evidence of malignancy. MRI abdomen 03/01/2022-numerous hypovascular hepatic lesions having imaging characteristics consider diagnostic of widespread metastatic disease to the liver.  Retroperitoneal lymphadenopathy concerning for metastatic disease. Cycle 1 FOLFOX 03/02/2022 03/11/2022 CTs abdomen/pelvis-distal large bowel obstruction at the site of a large annular mass at the rectosigmoid junction.  Progressive liver metastases. Compared to 4/23 CT. Progressive perirectal, left periaortic and right pericardiophrenic nodal metastases.  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 Cycle 2 FOLFOX 03/31/2022 Cycle 3 FOLFOX 04/13/2022 Cycle 4 FOLFOX delayed 04/28/2022 secondary to neutropenia and thrombocytopenia MRI lumbar spine 05/07/2022-pathologic compression fractures at L1 and L3, multiple additional hypointense lesions concerning for metastatic disease CT abdomen/pelvis 05/08/2022-decrease in size of liver metastases and pelvic/retroperitoneal adenopathy, stable prepericardial/juxta diaphragmatic nodes, new L5 metastasis, L1 and L3 compression fractures, L1 compression fracture present on 03/11/2022 CT Lumbar spine radiation 05/11/2022 - 05/25/2022, 3000 cGy in 10 fractions Cycle 4 FOLFOX 05/26/2022, Udenyca Cycle 5 FOLFOX 06/10/2022, Udenyca Rectal bleeding, lower abdominal pain, constipation secondary to #1 Multiple family members with cancer Hypercholesterolemia Port-A-Cath placement 02/20/2022 Hospital admission 03/11/2022-colonic obstruction secondary to colon cancer. Neutropenia secondary to chemotherapy      Disposition: Karen Kaufman appears  stable.  She has completed 4 cycles of FOLFOX.  Plan to proceed with cycle 5 today as scheduled.  She had delayed nausea.  Emend has been added to the premedication regimen.  She will contact the office with poorly controlled nausea.  CBC and chemistry panel reviewed.  Labs adequate to proceed as above.  AST/ALT with new mild elevation.  This may be related to oxaliplatin.  We will continue to follow.  She has developed left leg weakness over the past 2 weeks. Plan to discuss with Dr. Tammi Klippel.   She will return for lab, follow-up, FOLFOX in 2 weeks.  We are available to see her sooner if needed.  Patient seen with Dr. Benay Spice.  Ned Card ANP/GNP-BC   06/10/2022  8:51 AM  This was a shared visit with Ned Card.  Karen Kaufman was interviewed and examined.  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  I discussed the case with Dr. Tammi Klippel.  The plan is to begin a trial of Decadron.  If the response to Decadron Dr. Tammi Klippel will consider additional radiation to the lumbar spine.  This has been present for several weeks.  Did against a repeat MRI.  We will check on her symptoms again later this week.  I was present for greater than 50% of today's visit.  I performed medical decision making.  Julieanne Manson, MD

## 2022-06-10 NOTE — Addendum Note (Signed)
Addended by: Betsy Coder B on: 06/10/2022 08:26 PM   Modules accepted: Level of Service

## 2022-06-10 NOTE — Patient Instructions (Addendum)
[facility]  The chemotherapy medication bag should finish at 46 hours, 96 hours, or 7 days. For example, if your pump is scheduled for 46 hours and it was put on at 4:00 p.m., it should finish at 2:00 p.m. the day it is scheduled to come off regardless of your appointment time.     Estimated time to finish at 11:40 a.m. on Friday, June 12, 2022.   If the display on your pump reads "Low Volume" and it is beeping, take the batteries out of the pump and come to the cancer center for it to be taken off.   If the pump alarms go off prior to the pump reading "Low Volume" then call [phone] and someone can assist you.  If the plunger comes out and the chemotherapy medication is leaking out, please use your home chemo spill kit to clean up the spill. Do NOT use paper towels or other household products.  If you have problems or questions regarding your pump, please call either [phone] (24 hours a day) or the cancer center Monday-Friday 8:00 a.m.- 4:30 p.m. at the clinic number and we will assist you. If you are unable to get assistance, then go to the nearest Emergency Department and ask the staff to contact the IV team for assistance.   Discharge Instructions: Thank you for choosing [facility] to provide your oncology and hematology care.   If you have a lab appointment with the [facility], please go directly to the [facility] and check in at the registration area.   Wear comfortable clothing and clothing appropriate for easy access to any Portacath or PICC line.   We strive to give you quality time with your provider. You may need to reschedule your appointment if you arrive late (15 or more minutes).  Arriving late affects you and other patients whose appointments are after yours.  Also, if you miss three or more appointments without notifying the office, you may be dismissed from the clinic at the provider's discretion.      For  prescription refill requests, have your pharmacy contact our office and allow 72 hours for refills to be completed.    Today you received the following chemotherapy and/or immunotherapy agents Oxaliplatin, Leucovorin, Fluorouracil      To help prevent nausea and vomiting after your treatment, we encourage you to take your nausea medication as directed.  BELOW ARE SYMPTOMS THAT SHOULD BE REPORTED IMMEDIATELY: *FEVER GREATER THAN 100.4 F (38 C) OR HIGHER *CHILLS OR SWEATING *NAUSEA AND VOMITING THAT IS NOT CONTROLLED WITH YOUR NAUSEA MEDICATION *UNUSUAL SHORTNESS OF BREATH *UNUSUAL BRUISING OR BLEEDING *URINARY PROBLEMS (pain or burning when urinating, or frequent urination) *BOWEL PROBLEMS (unusual diarrhea, constipation, pain near the anus) TENDERNESS IN MOUTH AND THROAT WITH OR WITHOUT PRESENCE OF ULCERS (sore throat, sores in mouth, or a toothache) UNUSUAL RASH, SWELLING OR PAIN  UNUSUAL VAGINAL DISCHARGE OR ITCHING   Items with * indicate a potential emergency and should be followed up as soon as possible or go to the Emergency Department if any problems should occur.  Please show the CHEMOTHERAPY ALERT CARD or IMMUNOTHERAPY ALERT CARD at check-in to the Emergency Department and triage nurse.  Should you have questions after your visit or need to cancel or reschedule your appointment, please contact [facility]  Dept: [phone]  and follow the prompts.  Office hours are 8:00 a.m. to 4:30 p.m. Monday - Friday. Please note  that voicemails left after 4:00 p.m. may not be returned until the following business day.  We are closed weekends and major holidays. You have access to a nurse at all times for urgent questions. Please call the main number to the clinic Dept: [phone] and follow the prompts.   For any non-urgent questions, you may also contact your provider using MyChart. We now offer e-Visits for anyone 16 and older to request care online for  non-urgent symptoms. For details visit [email or web address].   Also download the MyChart app! Go to the app store, search "MyChart", open the app, select [facility], and log in with your MyChart username and password.  Masks are optional in the cancer centers. If you would like for your care team to wear a mask while they are taking care of you, please let them know. You may have one support person who is at least 62 years old accompany you for your appointments.  Oxaliplatin Injection What is this medication? OXALIPLATIN (ox AL i PLA tin) treats some types of cancer. It works by slowing down the growth of cancer cells. This medicine may be used for other purposes; ask your health care provider or pharmacist if you have questions. COMMON BRAND NAME(S): Eloxatin What should I tell my care team before I take this medication? They need to know if you have any of these conditions: Heart disease History of irregular heartbeat or rhythm Liver disease Low blood cell levels (white cells, red cells, and platelets) Lung or breathing disease, such as asthma Take medications that treat or prevent blood clots Tingling of the fingers, toes, or other nerve disorder An unusual or allergic reaction to oxaliplatin, other medications, foods, dyes, or preservatives If you or your partner are pregnant or trying to get pregnant Breast-feeding How should I use this medication? This medication is injected into a vein. It is given by your care team in a hospital or clinic setting. Talk to your care team about the use of this medication in children. Special care may be needed. Overdosage: If you think you have taken too much of this medicine contact a poison control center or emergency room at once. NOTE: This medicine is only for you. Do not share this medicine with others. What if I miss a dose? Keep appointments for follow-up doses. It is important not to miss a dose. Call your care team if you are unable  to keep an appointment. What may interact with this medication? Do not take this medication with any of the following: Cisapride Dronedarone Pimozide Thioridazine This medication may also interact with the following: Aspirin and aspirin-like medications Certain medications that treat or prevent blood clots, such as warfarin, apixaban, dabigatran, and rivaroxaban Cisplatin Cyclosporine Diuretics Medications for infection, such as acyclovir, adefovir, amphotericin B, bacitracin, cidofovir, foscarnet, ganciclovir, gentamicin, pentamidine, vancomycin NSAIDs, medications for pain and inflammation, such as ibuprofen or naproxen Other medications that cause heart rhythm changes Pamidronate Zoledronic acid This list may not describe all possible interactions. Give your health care provider a list of all the medicines, herbs, non-prescription drugs, or dietary supplements you use. Also tell them if you smoke, drink alcohol, or use illegal drugs. Some items may interact with your medicine. What should I watch for while using this medication? Your condition will be monitored carefully while you are receiving this medication. You may need blood work while taking this medication. This medication may make you feel generally unwell. This is not uncommon as chemotherapy can affect healthy cells as  well as cancer cells. Report any side effects. Continue your course of treatment even though you feel ill unless your care team tells you to stop. This medication may increase your risk of getting an infection. Call your care team for advice if you get a fever, chills, sore throat, or other symptoms of a cold or flu. Do not treat yourself. Try to avoid being around people who are sick. Avoid taking medications that contain aspirin, acetaminophen, ibuprofen, naproxen, or ketoprofen unless instructed by your care team. These medications may hide a fever. Be careful brushing or flossing your teeth or using a  toothpick because you may get an infection or bleed more easily. If you have any dental work done, tell your dentist you are receiving this medication. This medication can make you more sensitive to cold. Do not drink cold drinks or use ice. Cover exposed skin before coming in contact with cold temperatures or cold objects. When out in cold weather wear warm clothing and cover your mouth and nose to warm the air that goes into your lungs. Tell your care team if you get sensitive to the cold. Talk to your care team if you or your partner are pregnant or think either of you might be pregnant. This medication can cause serious birth defects if taken during pregnancy and for 9 months after the last dose. A negative pregnancy test is required before starting this medication. A reliable form of contraception is recommended while taking this medication and for 9 months after the last dose. Talk to your care team about effective forms of contraception. Do not father a child while taking this medication and for 6 months after the last dose. Use a condom while having sex during this time period. Do not breastfeed while taking this medication and for 3 months after the last dose. This medication may cause infertility. Talk to your care team if you are concerned about your fertility. What side effects may I notice from receiving this medication? Side effects that you should report to your care team as soon as possible: Allergic reactions--skin rash, itching, hives, swelling of the face, lips, tongue, or throat Bleeding--bloody or black, tar-like stools, vomiting blood or brown material that looks like coffee grounds, red or dark brown urine, small red or purple spots on skin, unusual bruising or bleeding Dry cough, shortness of breath or trouble breathing Heart rhythm changes--fast or irregular heartbeat, dizziness, feeling faint or lightheaded, chest pain, trouble breathing Infection--fever, chills, cough, sore  throat, wounds that don't heal, pain or trouble when passing urine, general feeling of discomfort or being unwell Liver injury--right upper belly pain, loss of appetite, nausea, light-colored stool, dark yellow or brown urine, yellowing skin or eyes, unusual weakness or fatigue Low red blood cell level--unusual weakness or fatigue, dizziness, headache, trouble breathing Muscle injury--unusual weakness or fatigue, muscle pain, dark yellow or brown urine, decrease in amount of urine Pain, tingling, or numbness in the hands or feet Sudden and severe headache, confusion, change in vision, seizures, which may be signs of posterior reversible encephalopathy syndrome (PRES) Unusual bruising or bleeding Side effects that usually do not require medical attention (report to your care team if they continue or are bothersome): Diarrhea Nausea Pain, redness, or swelling with sores inside the mouth or throat Unusual weakness or fatigue Vomiting This list may not describe all possible side effects. Call your doctor for medical advice about side effects. You may report side effects to FDA at 1-800-FDA-1088. Where should I keep  my medication? This medication is given in a hospital or clinic. It will not be stored at home. NOTE: This sheet is a summary. It may not cover all possible information. If you have questions about this medicine, talk to your doctor, pharmacist, or health care provider.  2023 Elsevier/Gold Standard (2022-01-30 00:00:00)   Leucovorin Injection What is this medication? LEUCOVORIN (loo koe VOR in) prevents side effects from certain medications, such as methotrexate. It works by increasing folate levels. This helps protect healthy cells in your body. It may also be used to treat anemia caused by low levels of folate. It can also be used with fluorouracil, a type of chemotherapy, to treat colorectal cancer. It works by increasing the effects of fluorouracil in the body. This medicine may be  used for other purposes; ask your health care provider or pharmacist if you have questions. What should I tell my care team before I take this medication? They need to know if you have any of these conditions: Anemia from low levels of vitamin B12 in the blood An unusual or allergic reaction to leucovorin, folic acid, other medications, foods, dyes, or preservatives Pregnant or trying to get pregnant Breastfeeding How should I use this medication? This medication is injected into a vein or a muscle. It is given by your care team in a hospital or clinic setting. Talk to your care team about the use of this medication in children. Special care may be needed. Overdosage: If you think you have taken too much of this medicine contact a poison control center or emergency room at once. NOTE: This medicine is only for you. Do not share this medicine with others. What if I miss a dose? Keep appointments for follow-up doses. It is important not to miss your dose. Call your care team if you are unable to keep an appointment. What may interact with this medication? Capecitabine Fluorouracil Phenobarbital Phenytoin Primidone Trimethoprim;sulfamethoxazole This list may not describe all possible interactions. Give your health care provider a list of all the medicines, herbs, non-prescription drugs, or dietary supplements you use. Also tell them if you smoke, drink alcohol, or use illegal drugs. Some items may interact with your medicine. What should I watch for while using this medication? Your condition will be monitored carefully while you are receiving this medication. This medication may increase the side effects of 5-fluorouracil. Tell your care team if you have diarrhea or mouth sores that do not get better or that get worse. What side effects may I notice from receiving this medication? Side effects that you should report to your care team as soon as possible: Allergic reactions--skin rash,  itching, hives, swelling of the face, lips, tongue, or throat This list may not describe all possible side effects. Call your doctor for medical advice about side effects. You may report side effects to FDA at 1-800-FDA-1088. Where should I keep my medication? This medication is given in a hospital or clinic. It will not be stored at home. NOTE: This sheet is a summary. It may not cover all possible information. If you have questions about this medicine, talk to your doctor, pharmacist, or health care provider.  2023 Elsevier/Gold Standard (2022-02-13 00:00:00)  Fluorouracil Injection What is this medication? FLUOROURACIL (flure oh YOOR a sil) treats some types of cancer. It works by slowing down the growth of cancer cells. This medicine may be used for other purposes; ask your health care provider or pharmacist if you have questions. COMMON BRAND NAME(S): Adrucil What  should I tell my care team before I take this medication? They need to know if you have any of these conditions: Blood disorders Dihydropyrimidine dehydrogenase (DPD) deficiency Infection, such as chickenpox, cold sores, herpes Kidney disease Liver disease Poor nutrition Recent or ongoing radiation therapy An unusual or allergic reaction to fluorouracil, other medications, foods, dyes, or preservatives If you or your partner are pregnant or trying to get pregnant Breast-feeding How should I use this medication? This medication is injected into a vein. It is administered by your care team in a hospital or clinic setting. Talk to your care team about the use of this medication in children. Special care may be needed. Overdosage: If you think you have taken too much of this medicine contact a poison control center or emergency room at once. NOTE: This medicine is only for you. Do not share this medicine with others. What if I miss a dose? Keep appointments for follow-up doses. It is important not to miss your dose. Call  your care team if you are unable to keep an appointment. What may interact with this medication? Do not take this medication with any of the following: Live virus vaccines This medication may also interact with the following: Medications that treat or prevent blood clots, such as warfarin, enoxaparin, dalteparin This list may not describe all possible interactions. Give your health care provider a list of all the medicines, herbs, non-prescription drugs, or dietary supplements you use. Also tell them if you smoke, drink alcohol, or use illegal drugs. Some items may interact with your medicine. What should I watch for while using this medication? Your condition will be monitored carefully while you are receiving this medication. This medication may make you feel generally unwell. This is not uncommon as chemotherapy can affect healthy cells as well as cancer cells. Report any side effects. Continue your course of treatment even though you feel ill unless your care team tells you to stop. In some cases, you may be given additional medications to help with side effects. Follow all directions for their use. This medication may increase your risk of getting an infection. Call your care team for advice if you get a fever, chills, sore throat, or other symptoms of a cold or flu. Do not treat yourself. Try to avoid being around people who are sick. This medication may increase your risk to bruise or bleed. Call your care team if you notice any unusual bleeding. Be careful brushing or flossing your teeth or using a toothpick because you may get an infection or bleed more easily. If you have any dental work done, tell your dentist you are receiving this medication. Avoid taking medications that contain aspirin, acetaminophen, ibuprofen, naproxen, or ketoprofen unless instructed by your care team. These medications may hide a fever. Do not treat diarrhea with over the counter products. Contact your care team if  you have diarrhea that lasts more than 2 days or if it is severe and watery. This medication can make you more sensitive to the sun. Keep out of the sun. If you cannot avoid being in the sun, wear protective clothing and sunscreen. Do not use sun lamps, tanning beds, or tanning booths. Talk to your care team if you or your partner wish to become pregnant or think you might be pregnant. This medication can cause serious birth defects if taken during pregnancy and for 3 months after the last dose. A reliable form of contraception is recommended while taking this medication and for  3 months after the last dose. Talk to your care team about effective forms of contraception. Do not father a child while taking this medication and for 3 months after the last dose. Use a condom while having sex during this time period. Do not breastfeed while taking this medication. This medication may cause infertility. Talk to your care team if you are concerned about your fertility. What side effects may I notice from receiving this medication? Side effects that you should report to your care team as soon as possible: Allergic reactions--skin rash, itching, hives, swelling of the face, lips, tongue, or throat Heart attack--pain or tightness in the chest, shoulders, arms, or jaw, nausea, shortness of breath, cold or clammy skin, feeling faint or lightheaded Heart failure--shortness of breath, swelling of the ankles, feet, or hands, sudden weight gain, unusual weakness or fatigue Heart rhythm changes--fast or irregular heartbeat, dizziness, feeling faint or lightheaded, chest pain, trouble breathing High ammonia level--unusual weakness or fatigue, confusion, loss of appetite, nausea, vomiting, seizures Infection--fever, chills, cough, sore throat, wounds that don't heal, pain or trouble when passing urine, general feeling of discomfort or being unwell Low red blood cell level--unusual weakness or fatigue, dizziness, headache,  trouble breathing Pain, tingling, or numbness in the hands or feet, muscle weakness, change in vision, confusion or trouble speaking, loss of balance or coordination, trouble walking, seizures Redness, swelling, and blistering of the skin over hands and feet Severe or prolonged diarrhea Unusual bruising or bleeding Side effects that usually do not require medical attention (report to your care team if they continue or are bothersome): Dry skin Headache Increased tears Nausea Pain, redness, or swelling with sores inside the mouth or throat Sensitivity to light Vomiting This list may not describe all possible side effects. Call your doctor for medical advice about side effects. You may report side effects to FDA at 1-800-FDA-1088. Where should I keep my medication? This medication is given in a hospital or clinic. It will not be stored at home. NOTE: This sheet is a summary. It may not cover all possible information. If you have questions about this medicine, talk to your doctor, pharmacist, or health care provider.  2023 Elsevier/Gold Standard (2022-02-10 00:00:00)

## 2022-06-11 ENCOUNTER — Other Ambulatory Visit: Payer: Self-pay

## 2022-06-12 ENCOUNTER — Telehealth: Payer: Self-pay

## 2022-06-12 ENCOUNTER — Inpatient Hospital Stay: Payer: BC Managed Care – PPO

## 2022-06-12 VITALS — BP 98/60 | HR 77 | Temp 98.0°F | Resp 20

## 2022-06-12 DIAGNOSIS — C20 Malignant neoplasm of rectum: Secondary | ICD-10-CM

## 2022-06-12 DIAGNOSIS — Z5111 Encounter for antineoplastic chemotherapy: Secondary | ICD-10-CM | POA: Diagnosis not present

## 2022-06-12 MED ORDER — PEGFILGRASTIM-CBQV 6 MG/0.6ML ~~LOC~~ SOSY
6.0000 mg | PREFILLED_SYRINGE | Freq: Once | SUBCUTANEOUS | Status: AC
  Administered 2022-06-12: 6 mg via SUBCUTANEOUS
  Filled 2022-06-12: qty 0.6

## 2022-06-12 MED ORDER — HEPARIN SOD (PORK) LOCK FLUSH 100 UNIT/ML IV SOLN
500.0000 [IU] | Freq: Once | INTRAVENOUS | Status: AC | PRN
  Administered 2022-06-12: 500 [IU]

## 2022-06-12 MED ORDER — SODIUM CHLORIDE 0.9% FLUSH
10.0000 mL | INTRAVENOUS | Status: DC | PRN
  Administered 2022-06-12: 10 mL

## 2022-06-12 NOTE — Telephone Encounter (Signed)
Patient stated her legs are not getting better. She is having neck pain. She is taking the dexamethasone as order. I advised the patient take her pain medication as order and to give [place] a call if the pain get worse. Patient verbal understanding and had no further questions or concerns

## 2022-06-12 NOTE — Patient Instructions (Signed)
Heparin injection What is this medication? HEPARIN (HEP a rin) is an anticoagulant. It is used to treat or prevent clots in the veins, arteries, lungs, or heart. It stops clots from forming or getting bigger. This medicine prevents clotting during open-heart surgery, dialysis, or in patients who are confined to bed. This medicine may be used for other purposes; ask your health care provider or pharmacist if you have questions. COMMON BRAND NAME(S): Hep-Lock, Hep-Lock U/P, Hepflush-10, Monoject Prefill Advanced Heparin Lock Flush, SASH Normal Saline and Heparin What should I tell my care team before I take this medication? They need to know if you have any of these conditions: bleeding disorders, such as hemophilia or low blood platelets bowel disease or diverticulitis endocarditis high blood pressure liver disease recent surgery or delivery of a baby stomach ulcers an unusual or allergic reaction to heparin, benzyl alcohol, sulfites, other medicines, foods, dyes, or preservatives pregnant or trying to get pregnant breast-feeding How should I use this medication? This medicine is given by injection or infusion into a vein. It can also be given by injection of small amounts under the skin. It is usually given by a health care professional in a hospital or clinic setting. If you get this medicine at home, you will be taught how to prepare and give this medicine. Use exactly as directed. Take your medicine at regular intervals. Do not take it more often than directed. Do not stop taking except on your doctor's advice. Stopping this medicine may increase your risk of a blot clot. Be sure to refill your prescription before you run out of medicine. It is important that you put your used needles and syringes in a special sharps container. Do not put them in a trash can. If you do not have a sharps container, call your pharmacist or healthcare provider to get one. Talk to your pediatrician regarding the  use of this medicine in children. While this medicine may be prescribed for children for selected conditions, precautions do apply. Overdosage: If you think you have taken too much of this medicine contact a poison control center or emergency room at once. NOTE: This medicine is only for you. Do not share this medicine with others. What if I miss a dose? If you miss a dose, take it as soon as you can. If it is almost time for your next dose, take only that dose. Do not take double or extra doses. What may interact with this medication? Do not take this medicine with any of the following medications: aspirin and aspirin-like drugs mifepristone medicines that treat or prevent blood clots like warfarin, enoxaparin, and dalteparin palifermin protamine This medicine may also interact with the following medications: dextran digoxin hydroxychloroquine medicines for treating colds or allergies nicotine NSAIDs, medicines for pain and inflammation, like ibuprofen or naproxen phenylbutazone tetracycline antibiotics This list may not describe all possible interactions. Give your health care provider a list of all the medicines, herbs, non-prescription drugs, or dietary supplements you use. Also tell them if you smoke, drink alcohol, or use illegal drugs. Some items may interact with your medicine. What should I watch for while using this medication? Visit your healthcare professional for regular checks on your progress. You may need blood work done while you are taking this medicine. Your condition will be monitored carefully while you are receiving this medicine. It is important not to miss any appointments. Wear a medical ID bracelet or chain, and carry a card that describes your disease and details   of your medicine and dosage times. Notify your doctor or healthcare professional at once if you have cold, blue hands or feet. If you are going to need surgery or other procedure, tell your healthcare  professional that you are using this medicine. Avoid sports and activities that might cause injury while you are using this medicine. Severe falls or injuries can cause unseen bleeding. Be careful when using sharp tools or knives. Consider using an electric razor. Take special care brushing or flossing your teeth. Report any injuries, bruising, or red spots on the skin to your healthcare professional. Using this medicine for a long time may weaken your bones and increase the risk of bone fractures. You should make sure that you get enough calcium and vitamin D while you are taking this medicine. Discuss the foods you eat and the vitamins you take with your healthcare professional. Wear a medical ID bracelet or chain. Carry a card that describes your disease and details of your medicine and dosage times. What side effects may I notice from receiving this medication? Side effects that you should report to your doctor or health care professional as soon as possible: allergic reactions like skin rash, itching or hives, swelling of the face, lips, or tongue bone pain fever, chills nausea, vomiting signs and symptoms of bleeding such as bloody or black, tarry stools; red or dark-brown urine; spitting up blood or brown material that looks like coffee grounds; red spots on the skin; unusual bruising or bleeding from the eye, gums, or nose signs and symptoms of a blood clot such as chest pain; shortness of breath; pain, swelling, or warmth in the leg signs and symptoms of a stroke such as changes in vision; confusion; trouble speaking or understanding; severe headaches; sudden numbness or weakness of the face, arm or leg; trouble walking; dizziness; loss of coordination Side effects that usually do not require medical attention (report to your doctor or health care professional if they continue or are bothersome): hair loss pain, redness, or irritation at site where injected This list may not describe all  possible side effects. Call your doctor for medical advice about side effects. You may report side effects to FDA at 1-800-FDA-1088. Where should I keep my medication? Keep out of the reach of children. Store unopened vials at room temperature between 15 and 30 degrees C (59 and 86 degrees F). Do not freeze. Do not use if solution is discolored or particulate matter is present. Throw away any unused medicine after the expiration date. NOTE: This sheet is a summary. It may not cover all possible information. If you have questions about this medicine, talk to your doctor, pharmacist, or health care provider.  2023 Elsevier/Gold Standard (2005-07-13 00:00:00)  

## 2022-06-12 NOTE — Telephone Encounter (Signed)
-----   Message from Owens Shark, NP sent at 06/12/2022 10:26 AM EDT ----- Please follow-up with Ms. Hewson today-is leg weakness better since beginning dexamethasone?

## 2022-06-15 ENCOUNTER — Telehealth: Payer: Self-pay

## 2022-06-15 NOTE — Telephone Encounter (Signed)
I called and spoke with Mrs. Karen Kaufman and she stated her legs are doing better, no more bone or joint pain. She start having acid reflux burning in the chest, nausea, and discomfort in the abdomen. She is trying to drink boost to help with the acid. I also advise the patient to take Tums antacid. Per Dr. Benay Spice decrease  decadron 4 mg daily.  - I reached out to Dr Johny Shears office and spoke with [facility] to give her an updated on the patient. He patient legs are doing better and the decadron  is effective. She is ready to start back radiation therapy.  - Mrs. Karen Kaufman gave verbal understanding and had no further questions or concerns,

## 2022-06-17 ENCOUNTER — Other Ambulatory Visit: Payer: Self-pay

## 2022-06-17 ENCOUNTER — Telehealth: Payer: Self-pay

## 2022-06-17 DIAGNOSIS — C20 Malignant neoplasm of rectum: Secondary | ICD-10-CM

## 2022-06-17 MED ORDER — PANTOPRAZOLE SODIUM 20 MG PO TBEC: 20.0000 mg | DELAYED_RELEASE_TABLET | Freq: Every day | ORAL | 0 refills | Status: DC

## 2022-06-17 NOTE — Telephone Encounter (Signed)
Patient called and stated her stomach is worst and she want to stop take Decadron. Per Dr. Benay Spice she need to continue the Decadron. I send in Protonix  for her stomach. Patient gave verbal understanding and had no further questions or concerns

## 2022-06-18 ENCOUNTER — Ambulatory Visit
Admission: RE | Admit: 2022-06-18 | Discharge: 2022-06-18 | Disposition: A | Discharge status: Home / Self Care | Payer: BC Managed Care – PPO | Source: Ambulatory Visit | Attending: Urology | Admitting: Urology

## 2022-06-18 ENCOUNTER — Other Ambulatory Visit: Payer: Self-pay

## 2022-06-18 ENCOUNTER — Encounter: Payer: Self-pay | Admitting: Urology

## 2022-06-18 ENCOUNTER — Ambulatory Visit: Payer: Self-pay | Admitting: Urology

## 2022-06-18 ENCOUNTER — Ambulatory Visit
Admission: RE | Admit: 2022-06-18 | Discharge: 2022-06-18 | Disposition: A | Discharge status: Home / Self Care | Payer: BC Managed Care – PPO | Source: Ambulatory Visit | Attending: Radiation Oncology | Admitting: Radiation Oncology

## 2022-06-18 VITALS — BP 125/63 | HR 82 | Temp 97.4°F | Resp 18 | Ht 66.0 in | Wt 122.4 lb

## 2022-06-18 DIAGNOSIS — C7951 Secondary malignant neoplasm of bone: Secondary | ICD-10-CM | POA: Insufficient documentation

## 2022-06-18 DIAGNOSIS — C20 Malignant neoplasm of rectum: Secondary | ICD-10-CM | POA: Insufficient documentation

## 2022-06-18 DIAGNOSIS — Z7952 Long term (current) use of systemic steroids: Secondary | ICD-10-CM | POA: Insufficient documentation

## 2022-06-18 DIAGNOSIS — Z803 Family history of malignant neoplasm of breast: Secondary | ICD-10-CM | POA: Insufficient documentation

## 2022-06-18 DIAGNOSIS — Z806 Family history of leukemia: Secondary | ICD-10-CM | POA: Diagnosis not present

## 2022-06-18 DIAGNOSIS — Z85828 Personal history of other malignant neoplasm of skin: Secondary | ICD-10-CM | POA: Diagnosis not present

## 2022-06-18 DIAGNOSIS — C189 Malignant neoplasm of colon, unspecified: Secondary | ICD-10-CM

## 2022-06-18 DIAGNOSIS — Z79899 Other long term (current) drug therapy: Secondary | ICD-10-CM | POA: Insufficient documentation

## 2022-06-18 NOTE — Progress Notes (Signed)
Reconsult appointment. I verified patient's identity and began nursing interview w/ spouse Mr. Persis Graffius in attendance. Patient reports central ABD pain 4/10, general fatigue/ weakness, moderate acid reflux, and gait disturbance. No other issues reported at this time.  Meaningful use complete. Hysterectomy- NO chances of pregnancy.  BP 125/63 (BP Location: Right Arm, Patient Position: Sitting, Cuff Size: Normal)   Pulse 82   Temp (!) 97.4 F (36.3 C) (Temporal)   Resp 18   Ht '5\' 6"'$  (1.676 m)   Wt 122 lb 6.4 oz (55.5 kg)   SpO2 100%   BMI 19.76 kg/m

## 2022-06-18 NOTE — Progress Notes (Signed)
  Radiation Oncology         ([phone]) [phone] ________________________________  Name: Karen Kaufman MRN: [id number]  Date: 05/25/2022  DOB: Feb 14, 1961  End of Treatment Note  Diagnosis:   61 y.o. 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  Beams/energy:   A 3D field set-up was employed with 6 MV X-rays  Narrative: The patient tolerated radiation treatment relatively well and did note improvement in her back pain towards the end of treatment.  Plan: The patient has completed radiation treatment. The patient will return to radiation oncology clinic for routine followup in one month. I advised her to call or return sooner if she has any questions or concerns related to her recovery or treatment. ________________________________  Sheral Apley. Tammi Klippel, M.D.

## 2022-06-18 NOTE — Progress Notes (Signed)
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 woman who developed intermittent fecal incontinence in early 2022 followed by constipation that started in December 2022.  This progressively worsened necessitating regular use of a laxatives.  She developed a constant aching discomfort at the low abdomen in April 2023 and had an episode of rectal bleeding on on 02/08/2022 which prompted a visit to the emergency department.  CT abdomen/pelvis without contrast showed severe circumferential wall thickening of the distal sigmoid colon and rectum; enlarged perirectal and low left retroperitoneal lymph nodes; multiple ill-defined hypodense liver lesions; trace ascites in the low pelvis.  She underwent a colonoscopy by Dr. Fuller Plan on 02/08/2022 which demonstrated a fungating, almost completely obstructing, mass in the rectosigmoid colon measuring from 12 to 17 cm.  Pathology showed invasive moderate-poorly differentiated adenocarcinoma.   Foundation 1 testing showed micro-satellite stable, tumor mutation burden 5, [id number], K-ras wild-type.  On 02/09/2022 CEA was 3.2.  She had a CT chest 02/18/2022 showing chest adenopathy and multiple hypodense liver lesions.  A biopsy of a liver lesion on 02/20/2022 yielded fragments of benign liver parenchyma with reactive changes only, no evidence of malignancy.  She had an MRI abdomen on 03/01/2022 which confirmed numerous hypovascular hepatic lesions with imaging characteristics considered diagnostic of widespread metastatic disease to the liver. There was also  retroperitoneal  lymphadenopathy concerning for metastatic disease.  She started Cycle 1 FOLFOX 03/02/2022 and has completed 5 cycles as of 06/10/22.  She developed back pain in July 2023 and MRI lumbar spine on 05/07/22 showed pathologic compression fractures of L1 and L3 with associated 60% and 40% vertebral body height loss, respectively but no retropulsion.   We initially met with her on 05/08/22 to discuss the role of palliative radiotherapy which she elected to proceed with and completed the 2 week course of radiation to T12-L4 on 05/25/22. She tolerated the radiation well and had resolution of her back pain. More recently, she developed LLE weakness that led to 2 falls at home. The left leg weakness has been persistent for several weeks and progressively worsening so she was started on dexamethasone 27m daily and has noted improvement in her lower extremity weakness since starting, suggesting that the LLE weakness is secondary to nerve compression from residual tumor in the lumbar spine. Therefore, she has been referred back to [facility] to discuss the potential for an additional course of radiation to T12-L4 to prevent progressive and/or permanent neurological deficits.  PREVIOUS RADIATION THERAPY: Yes- 05/11/22 - 05/25/22:   The painful metastatic disease at T12-L4 was treated to 30 Gy in 10 fractions.  Past Medical History:  Diagnosis Date   Allergy    Cancer ([facility]    skin cancer removed by dermatology   Cataract    Hyperlipidemia   :   Past Surgical History:  Procedure Laterality Date   ABDOMINAL HYSTERECTOMY  2002   BOWEL DECOMPRESSION N/A 02/09/2022   Procedure: BOWEL DECOMPRESSION;  Surgeon: SLadene Artist MD;  [place] [facility]  Service: Gastroenterology;  Laterality: N/A;  [place]   craniotomy-removal of tumor from behind right eye   BREAST SURGERY  2003   breast implants   COLONOSCOPY WITH PROPOFOL N/A 02/09/2022   Procedure: COLONOSCOPY WITH PROPOFOL;  Surgeon:  Ladene Artist, MD;  Location: [facility];  Service: Gastroenterology;  Laterality: N/A;   EYE SURGERY     bilateral cataract removal   FLEXIBLE SIGMOIDOSCOPY N/A 02/09/2022   Procedure: FLEXIBLE SIGMOIDOSCOPY;  Surgeon: Ladene Artist, MD;  Location: [facility];  Service: Gastroenterology;  Laterality: N/A;   ILEO LOOP COLOSTOMY CLOSURE N/A 03/12/2022   Procedure: LAPAROSCOPIC ASSISTED DIVERTING COLOSTOMY;  Surgeon: Michael Boston, MD;  Location: [facility];  Service: General;  Laterality: N/A;   IR IMAGING GUIDED PORT INSERTION  02/20/2022   IR US GUIDE BX ASP/DRAIN  02/20/2022   POLYPECTOMY  02/09/2022   Procedure: POLYPECTOMY;  Surgeon: Ladene Artist, MD;  Location: [facility] ENDOSCOPY;  Service: Gastroenterology;;  :   Current Outpatient Medications:    atorvastatin (LIPITOR) 20 MG tablet, Take 1 tablet (20 mg total) by mouth daily., Disp: 90 tablet, Rfl: 3   dexamethasone (DECADRON) 4 MG tablet, Take 1 tablet (4 mg total) by mouth 2 (two) times daily for 14 days., Disp: 28 tablet, Rfl: 0   HYDROcodone-acetaminophen (NORCO) 5-325 MG tablet, Take 1-2 tablets by mouth every 4 (four) hours as needed for moderate pain., Disp: 60 tablet, Rfl: 0   lidocaine-prilocaine (EMLA) cream, Apply to port site 1-2 hours prior to use, Disp: 30 g, Rfl: 2   magnesium oxide (MAG-OX) 400 MG tablet, Take 400 mg by mouth daily., Disp: , Rfl:    ondansetron (ZOFRAN) 8 MG tablet, Take 1 tablet (8 mg total) by mouth every 8 (eight) hours as needed for nausea or vomiting. Do not take until 72 hours after chemo, Disp: 20 tablet, Rfl: 0   pantoprazole (PROTONIX) 20 MG tablet, Take 1 tablet (20 mg total) by mouth daily., Disp: 30 tablet, Rfl: 0   polyethylene glycol (MIRALAX / GLYCOLAX) 17 g packet, Take 17 g by mouth daily., Disp: , Rfl:    prochlorperazine (COMPAZINE) 10 MG tablet, Take 1 tablet (10 mg total) by mouth every 6 (six) hours as needed for nausea or vomiting., Disp: 30 tablet, Rfl: 2 No current  facility-administered medications for this visit.  Facility-Administered Medications Ordered in Other Visits:    sodium chloride flush (NS) 0.9 % injection 10 mL, 10 mL, Intravenous, PRN, Ladell Pier, MD, 10 mL at 04/28/22 1017:   Allergies  Allergen Reactions   Iodine Anaphylaxis   Shellfish Allergy Anaphylaxis   Hydromorphone     "Burning Sensation"  :   Family History  Problem Relation Age of Onset   Cancer Mother        breast   Diabetes Mother    Hyperlipidemia Mother    Hypertension Mother    Cancer Father        prostate   Arthritis Maternal Grandmother    Arthritis Maternal Grandfather    Cancer Maternal Grandfather        hodgkins lymphoma   Arthritis Paternal Grandmother    Arthritis Paternal Grandfather    Diabetes Paternal Grandfather    Colon cancer Neg Hx    Pancreatic cancer Neg Hx    Esophageal cancer Neg Hx   :   Social History   Socioeconomic History   Marital status: Married    Spouse name: Not on file   Number of children: 2  Years of education: 34   Highest education level: Some college, no degree  Occupational History   Occupation: retired  Tobacco Use   Smoking status: Never   Smokeless tobacco: Never  Vaping Use   Vaping Use: Never used  Substance and Sexual Activity   Alcohol use: Not Currently   Drug use: Not Currently   Sexual activity: Yes    Birth control/protection: Surgical  Other Topics Concern   Not on file  Social History Narrative   Not on file   Social Determinants of Health   Financial Resource Strain: Low Risk  (02/17/2022)   Overall Financial Resource Strain (CARDIA)    Difficulty of Paying Living Expenses: Not very hard  Food Insecurity: No Food Insecurity (02/17/2022)   Hunger Vital Sign    Worried About Running Out of Food in the Last Year: Never true    [place] in the Last Year: Never true  Transportation Needs: No Transportation Needs (02/17/2022)   PRAPARE - Radiographer, therapeutic (Medical): No    Lack of Transportation (Non-Medical): No  Physical Activity: Insufficiently Active (02/17/2022)   Exercise Vital Sign    Days of Exercise per Week: 3 days    Minutes of Exercise per Session: 20 min  Stress: No Stress Concern Present (02/17/2022)   [place]    Feeling of Stress : Only a little  Social Connections: Socially Integrated (02/17/2022)   Social Connection and Isolation Panel [NHANES]    Frequency of Communication with Friends and Family: Three times a week    Frequency of Social Gatherings with Friends and Family: Three times a week    Attends Religious Services: 1 to 4 times per year    Active Member of Clubs or Organizations: Yes    Attends Archivist Meetings: 1 to 4 times per year    Marital Status: Married  Human resources officer Violence: Not At Risk (02/17/2022)   Humiliation, Afraid, Rape, and Kick questionnaire    Fear of Current or Ex-Partner: No    Emotionally Abused: No    Physically Abused: No    Sexually Abused: No  :  REVIEW OF SYSTEMS:  On review of systems, the patient reports that she is doing well overall. She denies any chest pain, shortness of breath, cough, fevers, chills, night sweats, unintended weight changes. She denies any bowel or bladder disturbances, and denies abdominal pain, nausea or vomiting. She denies any new musculoskeletal or joint aches or pains, new skin lesions or concerns. A complete review of systems is obtained and is otherwise negative.    PHYSICAL EXAM:  In general this is a well appearing Caucasian female in no acute distress. She's alert and oriented x4 and appropriate throughout the examination. Cardiopulmonary assessment is negative for acute distress and she exhibits normal effort.   KPS = 80  100 - Normal; no complaints; no evidence of disease. 90   - Able to carry on normal activity; minor signs or symptoms of disease. 80   -  Normal activity with effort; some signs or symptoms of disease. 3   - Cares for self; unable to carry on normal activity or to do active work. 60   - Requires occasional assistance, but is able to care for most of his personal needs. 50   - Requires considerable assistance and frequent medical care. 14   - Disabled; requires special care and assistance. 30   -  Severely disabled; hospital admission is indicated although death not imminent. 57   - Very sick; hospital admission necessary; active supportive treatment necessary. 10   - Moribund; fatal processes progressing rapidly. 0     - Dead  Karnofsky DA, Abelmann [place], Craver LS and Burchenal [facility] [phone]) The use of the nitrogen mustards in the palliative treatment of carcinoma: with particular reference to bronchogenic carcinoma Cancer 1 634-56  LABORATORY DATA:  Lab Results  Component Value Date   WBC 6.3 06/10/2022   HGB 10.6 (L) 06/10/2022   HCT 31.7 (L) 06/10/2022   MCV 91.6 06/10/2022   PLT 149 (L) 06/10/2022   Lab Results  Component Value Date   NA 139 06/10/2022   K 3.5 06/10/2022   CL 102 06/10/2022   CO2 29 06/10/2022   Lab Results  Component Value Date   ALT 56 (H) 06/10/2022   AST 73 (H) 06/10/2022   ALKPHOS 139 (H) 06/10/2022   BILITOT 0.5 06/10/2022     RADIOGRAPHY: No results found.    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 We discussed the natural history of metastatic colorectal carcinoma and general treatment, highlighting the role of palliative radiotherapy in the management. We discussed the available radiation techniques, and focused on the details and logistics of delivery. The recommendation is for a 5 fraction course of ultra-hypofractionated radiation (UHRT) to boost the previously treated disease at T12-L4 to prevent progressive and/or permanent  neurological deficits. We reviewed the anticipated acute and late sequelae associated with radiation in this setting. The patient was encouraged to ask questions that were answered to her stated satisfaction.  She is in agreement to proceed with the recommended 5 fraction course of ultra-hypofractionated radiation [place] Daily) to boost the previously treated disease at T12-L4 and has freely signed written consent to proceed today in the office. A copy of this document will be placed in her medical record. She is having terrible acid reflux and unable to sleep on the steroids so we will wean her off gradually by having her take 1/2 tablet (68m) daily for 1 week and then discontinue. We will share our discussion with Dr. SBenay Spiceand proceed with treatment planning accordingly. She is scheduled for CT SIM at 10am 06/19/22, in anticipation of beginning her treatments in the near future. She appears to have a good understanding of her disease and our recommendations and is comfortable and in agreement with the stated plan. She knows that she is welcome to call with any questions or concerns in the interim.  We personally spent 45 minutes in this encounter including chart review, reviewing radiological studies, meeting face-to-face with the patient, entering orders, coordinating care and completing documentation.    ANicholos Johns PA-C    MTyler Pita MD  [facility] Direct Dial: [phone] Fax: [phone]  Skype  LinkedIn

## 2022-06-19 ENCOUNTER — Ambulatory Visit
Admission: RE | Admit: 2022-06-19 | Discharge: 2022-06-19 | Disposition: A | Discharge status: Home / Self Care | Payer: BC Managed Care – PPO | Source: Ambulatory Visit | Attending: Radiation Oncology | Admitting: Radiation Oncology

## 2022-06-19 ENCOUNTER — Other Ambulatory Visit: Payer: Self-pay

## 2022-06-19 DIAGNOSIS — C7951 Secondary malignant neoplasm of bone: Secondary | ICD-10-CM | POA: Diagnosis present

## 2022-06-19 DIAGNOSIS — C20 Malignant neoplasm of rectum: Secondary | ICD-10-CM | POA: Insufficient documentation

## 2022-06-21 ENCOUNTER — Other Ambulatory Visit: Payer: Self-pay | Admitting: Hematology and Oncology

## 2022-06-21 MED ORDER — FAMOTIDINE 20 MG PO TABS: 20.0000 mg | ORAL_TABLET | Freq: Two times a day (BID) | ORAL | 1 refills | Status: DC

## 2022-06-21 NOTE — Progress Notes (Unsigned)
Patient called complaining of severe acid reflux and stomach pain -currently on protonix but she tells me that it hasnt been working -

## 2022-06-22 ENCOUNTER — Other Ambulatory Visit: Payer: Self-pay | Admitting: Oncology

## 2022-06-22 DIAGNOSIS — C20 Malignant neoplasm of rectum: Secondary | ICD-10-CM

## 2022-06-23 ENCOUNTER — Inpatient Hospital Stay (HOSPITAL_BASED_OUTPATIENT_CLINIC_OR_DEPARTMENT_OTHER): Payer: BC Managed Care – PPO | Admitting: Nurse Practitioner

## 2022-06-23 ENCOUNTER — Inpatient Hospital Stay: Payer: BC Managed Care – PPO

## 2022-06-23 ENCOUNTER — Encounter: Payer: Self-pay | Admitting: Nurse Practitioner

## 2022-06-23 ENCOUNTER — Inpatient Hospital Stay: Payer: BC Managed Care – PPO | Attending: Nurse Practitioner

## 2022-06-23 ENCOUNTER — Other Ambulatory Visit: Payer: Self-pay | Admitting: Radiation Therapy

## 2022-06-23 ENCOUNTER — Other Ambulatory Visit: Payer: Self-pay | Admitting: Radiation Oncology

## 2022-06-23 VITALS — BP 105/55 | HR 71

## 2022-06-23 VITALS — BP 98/62 | HR 80 | Temp 97.5°F | Resp 18 | Wt 120.4 lb

## 2022-06-23 DIAGNOSIS — C20 Malignant neoplasm of rectum: Secondary | ICD-10-CM

## 2022-06-23 DIAGNOSIS — D701 Agranulocytosis secondary to cancer chemotherapy: Secondary | ICD-10-CM | POA: Diagnosis not present

## 2022-06-23 DIAGNOSIS — E7800 Pure hypercholesterolemia, unspecified: Secondary | ICD-10-CM | POA: Insufficient documentation

## 2022-06-23 DIAGNOSIS — C7951 Secondary malignant neoplasm of bone: Secondary | ICD-10-CM

## 2022-06-23 DIAGNOSIS — D61810 Antineoplastic chemotherapy induced pancytopenia: Secondary | ICD-10-CM | POA: Diagnosis not present

## 2022-06-23 DIAGNOSIS — Z933 Colostomy status: Secondary | ICD-10-CM | POA: Diagnosis not present

## 2022-06-23 DIAGNOSIS — Z5111 Encounter for antineoplastic chemotherapy: Secondary | ICD-10-CM | POA: Insufficient documentation

## 2022-06-23 DIAGNOSIS — G893 Neoplasm related pain (acute) (chronic): Secondary | ICD-10-CM | POA: Diagnosis not present

## 2022-06-23 DIAGNOSIS — T451X5D Adverse effect of antineoplastic and immunosuppressive drugs, subsequent encounter: Secondary | ICD-10-CM | POA: Insufficient documentation

## 2022-06-23 DIAGNOSIS — Z85048 Personal history of other malignant neoplasm of rectum, rectosigmoid junction, and anus: Secondary | ICD-10-CM

## 2022-06-23 LAB — CMP (CANCER CENTER ONLY)
ALT: 29 U/L (ref 0–44)
AST: 27 U/L (ref 15–41)
Albumin: 3.2 g/dL — ABNORMAL LOW (ref 3.5–5.0)
Alkaline Phosphatase: 189 U/L — ABNORMAL HIGH (ref 38–126)
Anion gap: 7 (ref 5–15)
BUN: 9 mg/dL (ref 8–23)
CO2: 28 mmol/L (ref 22–32)
Calcium: 7.7 mg/dL — ABNORMAL LOW (ref 8.9–10.3)
Chloride: 101 mmol/L (ref 98–111)
Creatinine: 0.48 mg/dL (ref 0.44–1.00)
GFR, Estimated: >60 mL/min (ref >60)
Glucose, Bld: 100 mg/dL — ABNORMAL HIGH (ref 70–99)
Potassium: 3.5 mmol/L (ref 3.5–5.1)
Sodium: 136 mmol/L (ref 135–145)
Total Bilirubin: 0.5 mg/dL (ref 0.3–1.2)
Total Protein: 5 g/dL — ABNORMAL LOW (ref 6.5–8.1)

## 2022-06-23 LAB — CBC WITH DIFFERENTIAL (CANCER CENTER ONLY)
Abs Immature Granulocytes: 0.34 10*3/uL — ABNORMAL HIGH (ref 0.00–0.07)
Basophils Absolute: 0 10*3/uL (ref 0.0–0.1)
Basophils Relative: 0 %
Eosinophils Absolute: 0 10*3/uL (ref 0.0–0.5)
Eosinophils Relative: 0 %
HCT: 31.2 % — ABNORMAL LOW (ref 36.0–46.0)
Hemoglobin: 10.4 g/dL — ABNORMAL LOW (ref 12.0–15.0)
Immature Granulocytes: 5 %
Lymphocytes Relative: 9 %
Lymphs Abs: 0.6 10*3/uL — ABNORMAL LOW (ref 0.7–4.0)
MCH: 30.6 pg (ref 26.0–34.0)
MCHC: 33.3 g/dL (ref 30.0–36.0)
MCV: 91.8 fL (ref 80.0–100.0)
Monocytes Absolute: 0.7 10*3/uL (ref 0.1–1.0)
Monocytes Relative: 10 %
Neutro Abs: 5.3 10*3/uL (ref 1.7–7.7)
Neutrophils Relative %: 76 %
Platelet Count: 89 10*3/uL — ABNORMAL LOW (ref 150–400)
RBC: 3.4 MIL/uL — ABNORMAL LOW (ref 3.87–5.11)
RDW: 16.5 % — ABNORMAL HIGH (ref 11.5–15.5)
WBC Count: 7.1 10*3/uL (ref 4.0–10.5)
nRBC: 0.3 % — ABNORMAL HIGH (ref 0.0–0.2)

## 2022-06-23 LAB — CEA (ACCESS): CEA (CHCC): 8.59 ng/mL — ABNORMAL HIGH (ref 0.00–5.00)

## 2022-06-23 MED ORDER — DEXTROSE 5 % IV SOLN: Freq: Once | INTRAVENOUS | Status: AC

## 2022-06-23 MED ORDER — LEUCOVORIN CALCIUM INJECTION 350 MG
400.0000 mg/m2 | Freq: Once | INTRAVENOUS | Status: AC
  Administered 2022-06-23: 636 mg via INTRAVENOUS
  Filled 2022-06-23: qty 25

## 2022-06-23 MED ORDER — SODIUM CHLORIDE 0.9 % IV SOLN: 5.0000 mg | Freq: Once | INTRAVENOUS | Status: DC

## 2022-06-23 MED ORDER — FLUOROURACIL CHEMO INJECTION 2.5 GM/50ML
400.0000 mg/m2 | Freq: Once | INTRAVENOUS | Status: AC
  Administered 2022-06-23: 650 mg via INTRAVENOUS
  Filled 2022-06-23: qty 13

## 2022-06-23 MED ORDER — DEXAMETHASONE SODIUM PHOSPHATE 10 MG/ML IJ SOLN
5.0000 mg | Freq: Once | INTRAMUSCULAR | Status: AC
  Administered 2022-06-23: 5 mg via INTRAVENOUS
  Filled 2022-06-23: qty 1

## 2022-06-23 MED ORDER — PALONOSETRON HCL INJECTION 0.25 MG/5ML
0.2500 mg | Freq: Once | INTRAVENOUS | Status: AC
  Administered 2022-06-23: 0.25 mg via INTRAVENOUS
  Filled 2022-06-23: qty 5

## 2022-06-23 MED ORDER — SODIUM CHLORIDE 0.9 % IV SOLN
150.0000 mg | Freq: Once | INTRAVENOUS | Status: AC
  Administered 2022-06-23: 150 mg via INTRAVENOUS
  Filled 2022-06-23: qty 150

## 2022-06-23 MED ORDER — OXALIPLATIN CHEMO INJECTION 100 MG/20ML
65.0000 mg/m2 | Freq: Once | INTRAVENOUS | Status: AC
  Administered 2022-06-23: 105 mg via INTRAVENOUS
  Filled 2022-06-23: qty 10

## 2022-06-23 MED ORDER — SODIUM CHLORIDE 0.9 % IV SOLN
2400.0000 mg/m2 | INTRAVENOUS | Status: DC
  Administered 2022-06-23: 3800 mg via INTRAVENOUS
  Filled 2022-06-23: qty 76

## 2022-06-23 NOTE — Patient Instructions (Addendum)
[facility]  The chemotherapy medication bag should finish at 46 hours, 96 hours, or 7 days. For example, if your pump is scheduled for 46 hours and it was put on at 4:00 p.m., it should finish at 2:00 p.m. the day it is scheduled to come off regardless of your appointment time.     Estimated time to finish at 11:15 a.m. on Thursday, September 7th, 2023.   If the display on your pump reads "Low Volume" and it is beeping, take the batteries out of the pump and come to the cancer center for it to be taken off.   If the pump alarms go off prior to the pump reading "Low Volume" then call [phone] and someone can assist you.  If the plunger comes out and the chemotherapy medication is leaking out, please use your home chemo spill kit to clean up the spill. Do NOT use paper towels or other household products.  If you have problems or questions regarding your pump, please call either [phone] (24 hours a day) or the cancer center Monday-Friday 8:00 a.m.- 4:30 p.m. at the clinic number and we will assist you. If you are unable to get assistance, then go to the nearest Emergency Department and ask the staff to contact the IV team for assistance.   Discharge Instructions: Thank you for choosing [facility] to provide your oncology and hematology care.   If you have a lab appointment with the [facility], please go directly to the [facility] and check in at the registration area.   Wear comfortable clothing and clothing appropriate for easy access to any Portacath or PICC line.   We strive to give you quality time with your provider. You may need to reschedule your appointment if you arrive late (15 or more minutes).  Arriving late affects you and other patients whose appointments are after yours.  Also, if you miss three or more appointments without notifying the office, you may be dismissed from the clinic at the provider's discretion.      For  prescription refill requests, have your pharmacy contact our office and allow 72 hours for refills to be completed.    Today you received the following chemotherapy and/or immunotherapy agents Oxaliplatin, Leucovorin, Fluorouracil      To help prevent nausea and vomiting after your treatment, we encourage you to take your nausea medication as directed.  BELOW ARE SYMPTOMS THAT SHOULD BE REPORTED IMMEDIATELY: *FEVER GREATER THAN 100.4 F (38 C) OR HIGHER *CHILLS OR SWEATING *NAUSEA AND VOMITING THAT IS NOT CONTROLLED WITH YOUR NAUSEA MEDICATION *UNUSUAL SHORTNESS OF BREATH *UNUSUAL BRUISING OR BLEEDING *URINARY PROBLEMS (pain or burning when urinating, or frequent urination) *BOWEL PROBLEMS (unusual diarrhea, constipation, pain near the anus) TENDERNESS IN MOUTH AND THROAT WITH OR WITHOUT PRESENCE OF ULCERS (sore throat, sores in mouth, or a toothache) UNUSUAL RASH, SWELLING OR PAIN  UNUSUAL VAGINAL DISCHARGE OR ITCHING   Items with * indicate a potential emergency and should be followed up as soon as possible or go to the Emergency Department if any problems should occur.  Please show the CHEMOTHERAPY ALERT CARD or IMMUNOTHERAPY ALERT CARD at check-in to the Emergency Department and triage nurse.  Should you have questions after your visit or need to cancel or reschedule your appointment, please contact [facility]  Dept: [phone]  and follow the prompts.  Office hours are 8:00 a.m. to 4:30 p.m. Monday - Friday. Please note  that voicemails left after 4:00 p.m. may not be returned until the following business day.  We are closed weekends and major holidays. You have access to a nurse at all times for urgent questions. Please call the main number to the clinic Dept: [phone] and follow the prompts.   For any non-urgent questions, you may also contact your provider using MyChart. We now offer e-Visits for anyone 32 and older to request care online for  non-urgent symptoms. For details visit [email or web address].   Also download the MyChart app! Go to the app store, search "MyChart", open the app, select [facility], and log in with your MyChart username and password.  Masks are optional in the cancer centers. If you would like for your care team to wear a mask while they are taking care of you, please let them know. You may have one support person who is at least 62 years old accompany you for your appointments.  Oxaliplatin Injection What is this medication? OXALIPLATIN (ox AL i PLA tin) treats some types of cancer. It works by slowing down the growth of cancer cells. This medicine may be used for other purposes; ask your health care provider or pharmacist if you have questions. COMMON BRAND NAME(S): Eloxatin What should I tell my care team before I take this medication? They need to know if you have any of these conditions: Heart disease History of irregular heartbeat or rhythm Liver disease Low blood cell levels (white cells, red cells, and platelets) Lung or breathing disease, such as asthma Take medications that treat or prevent blood clots Tingling of the fingers, toes, or other nerve disorder An unusual or allergic reaction to oxaliplatin, other medications, foods, dyes, or preservatives If you or your partner are pregnant or trying to get pregnant Breast-feeding How should I use this medication? This medication is injected into a vein. It is given by your care team in a hospital or clinic setting. Talk to your care team about the use of this medication in children. Special care may be needed. Overdosage: If you think you have taken too much of this medicine contact a poison control center or emergency room at once. NOTE: This medicine is only for you. Do not share this medicine with others. What if I miss a dose? Keep appointments for follow-up doses. It is important not to miss a dose. Call your care team if you are unable  to keep an appointment. What may interact with this medication? Do not take this medication with any of the following: Cisapride Dronedarone Pimozide Thioridazine This medication may also interact with the following: Aspirin and aspirin-like medications Certain medications that treat or prevent blood clots, such as warfarin, apixaban, dabigatran, and rivaroxaban Cisplatin Cyclosporine Diuretics Medications for infection, such as acyclovir, adefovir, amphotericin B, bacitracin, cidofovir, foscarnet, ganciclovir, gentamicin, pentamidine, vancomycin NSAIDs, medications for pain and inflammation, such as ibuprofen or naproxen Other medications that cause heart rhythm changes Pamidronate Zoledronic acid This list may not describe all possible interactions. Give your health care provider a list of all the medicines, herbs, non-prescription drugs, or dietary supplements you use. Also tell them if you smoke, drink alcohol, or use illegal drugs. Some items may interact with your medicine. What should I watch for while using this medication? Your condition will be monitored carefully while you are receiving this medication. You may need blood work while taking this medication. This medication may make you feel generally unwell. This is not uncommon as chemotherapy can affect healthy cells as  well as cancer cells. Report any side effects. Continue your course of treatment even though you feel ill unless your care team tells you to stop. This medication may increase your risk of getting an infection. Call your care team for advice if you get a fever, chills, sore throat, or other symptoms of a cold or flu. Do not treat yourself. Try to avoid being around people who are sick. Avoid taking medications that contain aspirin, acetaminophen, ibuprofen, naproxen, or ketoprofen unless instructed by your care team. These medications may hide a fever. Be careful brushing or flossing your teeth or using a  toothpick because you may get an infection or bleed more easily. If you have any dental work done, tell your dentist you are receiving this medication. This medication can make you more sensitive to cold. Do not drink cold drinks or use ice. Cover exposed skin before coming in contact with cold temperatures or cold objects. When out in cold weather wear warm clothing and cover your mouth and nose to warm the air that goes into your lungs. Tell your care team if you get sensitive to the cold. Talk to your care team if you or your partner are pregnant or think either of you might be pregnant. This medication can cause serious birth defects if taken during pregnancy and for 9 months after the last dose. A negative pregnancy test is required before starting this medication. A reliable form of contraception is recommended while taking this medication and for 9 months after the last dose. Talk to your care team about effective forms of contraception. Do not father a child while taking this medication and for 6 months after the last dose. Use a condom while having sex during this time period. Do not breastfeed while taking this medication and for 3 months after the last dose. This medication may cause infertility. Talk to your care team if you are concerned about your fertility. What side effects may I notice from receiving this medication? Side effects that you should report to your care team as soon as possible: Allergic reactions--skin rash, itching, hives, swelling of the face, lips, tongue, or throat Bleeding--bloody or black, tar-like stools, vomiting blood or brown material that looks like coffee grounds, red or dark brown urine, small red or purple spots on skin, unusual bruising or bleeding Dry cough, shortness of breath or trouble breathing Heart rhythm changes--fast or irregular heartbeat, dizziness, feeling faint or lightheaded, chest pain, trouble breathing Infection--fever, chills, cough, sore  throat, wounds that don't heal, pain or trouble when passing urine, general feeling of discomfort or being unwell Liver injury--right upper belly pain, loss of appetite, nausea, light-colored stool, dark yellow or brown urine, yellowing skin or eyes, unusual weakness or fatigue Low red blood cell level--unusual weakness or fatigue, dizziness, headache, trouble breathing Muscle injury--unusual weakness or fatigue, muscle pain, dark yellow or brown urine, decrease in amount of urine Pain, tingling, or numbness in the hands or feet Sudden and severe headache, confusion, change in vision, seizures, which may be signs of posterior reversible encephalopathy syndrome (PRES) Unusual bruising or bleeding Side effects that usually do not require medical attention (report to your care team if they continue or are bothersome): Diarrhea Nausea Pain, redness, or swelling with sores inside the mouth or throat Unusual weakness or fatigue Vomiting This list may not describe all possible side effects. Call your doctor for medical advice about side effects. You may report side effects to FDA at 1-800-FDA-1088. Where should I keep  my medication? This medication is given in a hospital or clinic. It will not be stored at home. NOTE: This sheet is a summary. It may not cover all possible information. If you have questions about this medicine, talk to your doctor, pharmacist, or health care provider.  2023 Elsevier/Gold Standard (2022-01-30 00:00:00)   Leucovorin Injection What is this medication? LEUCOVORIN (loo koe VOR in) prevents side effects from certain medications, such as methotrexate. It works by increasing folate levels. This helps protect healthy cells in your body. It may also be used to treat anemia caused by low levels of folate. It can also be used with fluorouracil, a type of chemotherapy, to treat colorectal cancer. It works by increasing the effects of fluorouracil in the body. This medicine may be  used for other purposes; ask your health care provider or pharmacist if you have questions. What should I tell my care team before I take this medication? They need to know if you have any of these conditions: Anemia from low levels of vitamin B12 in the blood An unusual or allergic reaction to leucovorin, folic acid, other medications, foods, dyes, or preservatives Pregnant or trying to get pregnant Breastfeeding How should I use this medication? This medication is injected into a vein or a muscle. It is given by your care team in a hospital or clinic setting. Talk to your care team about the use of this medication in children. Special care may be needed. Overdosage: If you think you have taken too much of this medicine contact a poison control center or emergency room at once. NOTE: This medicine is only for you. Do not share this medicine with others. What if I miss a dose? Keep appointments for follow-up doses. It is important not to miss your dose. Call your care team if you are unable to keep an appointment. What may interact with this medication? Capecitabine Fluorouracil Phenobarbital Phenytoin Primidone Trimethoprim;sulfamethoxazole This list may not describe all possible interactions. Give your health care provider a list of all the medicines, herbs, non-prescription drugs, or dietary supplements you use. Also tell them if you smoke, drink alcohol, or use illegal drugs. Some items may interact with your medicine. What should I watch for while using this medication? Your condition will be monitored carefully while you are receiving this medication. This medication may increase the side effects of 5-fluorouracil. Tell your care team if you have diarrhea or mouth sores that do not get better or that get worse. What side effects may I notice from receiving this medication? Side effects that you should report to your care team as soon as possible: Allergic reactions--skin rash,  itching, hives, swelling of the face, lips, tongue, or throat This list may not describe all possible side effects. Call your doctor for medical advice about side effects. You may report side effects to FDA at 1-800-FDA-1088. Where should I keep my medication? This medication is given in a hospital or clinic. It will not be stored at home. NOTE: This sheet is a summary. It may not cover all possible information. If you have questions about this medicine, talk to your doctor, pharmacist, or health care provider.  2023 Elsevier/Gold Standard (2022-02-13 00:00:00)  Fluorouracil Injection What is this medication? FLUOROURACIL (flure oh YOOR a sil) treats some types of cancer. It works by slowing down the growth of cancer cells. This medicine may be used for other purposes; ask your health care provider or pharmacist if you have questions. COMMON BRAND NAME(S): Adrucil What  should I tell my care team before I take this medication? They need to know if you have any of these conditions: Blood disorders Dihydropyrimidine dehydrogenase (DPD) deficiency Infection, such as chickenpox, cold sores, herpes Kidney disease Liver disease Poor nutrition Recent or ongoing radiation therapy An unusual or allergic reaction to fluorouracil, other medications, foods, dyes, or preservatives If you or your partner are pregnant or trying to get pregnant Breast-feeding How should I use this medication? This medication is injected into a vein. It is administered by your care team in a hospital or clinic setting. Talk to your care team about the use of this medication in children. Special care may be needed. Overdosage: If you think you have taken too much of this medicine contact a poison control center or emergency room at once. NOTE: This medicine is only for you. Do not share this medicine with others. What if I miss a dose? Keep appointments for follow-up doses. It is important not to miss your dose. Call  your care team if you are unable to keep an appointment. What may interact with this medication? Do not take this medication with any of the following: Live virus vaccines This medication may also interact with the following: Medications that treat or prevent blood clots, such as warfarin, enoxaparin, dalteparin This list may not describe all possible interactions. Give your health care provider a list of all the medicines, herbs, non-prescription drugs, or dietary supplements you use. Also tell them if you smoke, drink alcohol, or use illegal drugs. Some items may interact with your medicine. What should I watch for while using this medication? Your condition will be monitored carefully while you are receiving this medication. This medication may make you feel generally unwell. This is not uncommon as chemotherapy can affect healthy cells as well as cancer cells. Report any side effects. Continue your course of treatment even though you feel ill unless your care team tells you to stop. In some cases, you may be given additional medications to help with side effects. Follow all directions for their use. This medication may increase your risk of getting an infection. Call your care team for advice if you get a fever, chills, sore throat, or other symptoms of a cold or flu. Do not treat yourself. Try to avoid being around people who are sick. This medication may increase your risk to bruise or bleed. Call your care team if you notice any unusual bleeding. Be careful brushing or flossing your teeth or using a toothpick because you may get an infection or bleed more easily. If you have any dental work done, tell your dentist you are receiving this medication. Avoid taking medications that contain aspirin, acetaminophen, ibuprofen, naproxen, or ketoprofen unless instructed by your care team. These medications may hide a fever. Do not treat diarrhea with over the counter products. Contact your care team if  you have diarrhea that lasts more than 2 days or if it is severe and watery. This medication can make you more sensitive to the sun. Keep out of the sun. If you cannot avoid being in the sun, wear protective clothing and sunscreen. Do not use sun lamps, tanning beds, or tanning booths. Talk to your care team if you or your partner wish to become pregnant or think you might be pregnant. This medication can cause serious birth defects if taken during pregnancy and for 3 months after the last dose. A reliable form of contraception is recommended while taking this medication and for  3 months after the last dose. Talk to your care team about effective forms of contraception. Do not father a child while taking this medication and for 3 months after the last dose. Use a condom while having sex during this time period. Do not breastfeed while taking this medication. This medication may cause infertility. Talk to your care team if you are concerned about your fertility. What side effects may I notice from receiving this medication? Side effects that you should report to your care team as soon as possible: Allergic reactions--skin rash, itching, hives, swelling of the face, lips, tongue, or throat Heart attack--pain or tightness in the chest, shoulders, arms, or jaw, nausea, shortness of breath, cold or clammy skin, feeling faint or lightheaded Heart failure--shortness of breath, swelling of the ankles, feet, or hands, sudden weight gain, unusual weakness or fatigue Heart rhythm changes--fast or irregular heartbeat, dizziness, feeling faint or lightheaded, chest pain, trouble breathing High ammonia level--unusual weakness or fatigue, confusion, loss of appetite, nausea, vomiting, seizures Infection--fever, chills, cough, sore throat, wounds that don't heal, pain or trouble when passing urine, general feeling of discomfort or being unwell Low red blood cell level--unusual weakness or fatigue, dizziness, headache,  trouble breathing Pain, tingling, or numbness in the hands or feet, muscle weakness, change in vision, confusion or trouble speaking, loss of balance or coordination, trouble walking, seizures Redness, swelling, and blistering of the skin over hands and feet Severe or prolonged diarrhea Unusual bruising or bleeding Side effects that usually do not require medical attention (report to your care team if they continue or are bothersome): Dry skin Headache Increased tears Nausea Pain, redness, or swelling with sores inside the mouth or throat Sensitivity to light Vomiting This list may not describe all possible side effects. Call your doctor for medical advice about side effects. You may report side effects to FDA at 1-800-FDA-1088. Where should I keep my medication? This medication is given in a hospital or clinic. It will not be stored at home. NOTE: This sheet is a summary. It may not cover all possible information. If you have questions about this medicine, talk to your doctor, pharmacist, or health care provider.  2023 Elsevier/Gold Standard (2022-02-10 00:00:00)

## 2022-06-23 NOTE — Progress Notes (Signed)
  Radiation Oncology         ([phone]) [phone] ________________________________  Name: Karen Kaufman MRN: [id number]  Date: 06/19/2022  DOB: [birth year]  STEREOTACTIC BODY RADIOTHERAPY SIMULATION AND TREATMENT PLANNING NOTE    ICD-10-CM   1. 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  Identity was confirmed.  All relevant records and images related to the planned course of therapy were reviewed.  The patient freely provided informed written consent to proceed with treatment after reviewing the details related to the planned course of therapy. The consent form was witnessed and verified by the simulation staff.  Then, the patient was set-up in a stable reproducible  supine position for radiation therapy.  A BodyFix immobilization pillow was fabricated for reproducible positioning.  Surface markings were placed.  The CT images were loaded into the planning software.  The gross target volumes (GTV) and planning target volumes (PTV) were delinieated, and avoidance structures were contoured.  Treatment planning then occurred.  The radiation prescription was entered and confirmed.  A total of two complex treatment devices were fabricated in the form of the BodyFix immobilization pillow and a neck accuform cushion.  I have requested : 3D Simulation  I have requested a DVH of the following structures: targets and all normal structures near the target including kidneys, bowel, spinal cord and skin as noted on the radiation plan to maintain doses in adherence with established limits  SPECIAL TREATMENT PROCEDURE:  The planned course of therapy using radiation constitutes a special treatment procedure. Special care is required in the management of this patient for the following reasons. High dose per fraction requiring  special monitoring for increased toxicities of treatment including daily imaging..  The special nature of the planned course of radiotherapy will require increased physician supervision and oversight to ensure patient's safety with optimal treatment outcomes.    This requires extended time and effort.    PLAN:  The patient will receive 30 Gy in 5 fractions to boost previous conventional radiation.  ________________________________  Sheral Apley Tammi Klippel, M.D.

## 2022-06-23 NOTE — Progress Notes (Signed)
[place] OFFICE PROGRESS NOTE   Diagnosis: Rectal cancer  INTERVAL HISTORY:   Karen Kaufman returns as scheduled.  She completed cycle 5 FOLFOX 06/10/2022.  She is scheduled to begin a 5 fraction course of ultra hypofractionated radiation to boost the previously treated disease at [facility] 07/01/2022.  She reports developing significant acid reflux and nausea/vomiting.  She attributes this to the oral steroids.  She has completed a steroid taper.  She began Pepcid and Maalox 2 days ago.  Symptoms are better.  She denies neuropathy symptoms.  Colostomy is functioning.  Objective:  Vital signs in last 24 hours:  Blood pressure 98/62, pulse 80, temperature (!) 97.5 F (36.4 C), temperature source Tympanic, resp. rate 18, weight 120 lb 6.4 oz (54.6 kg), SpO2 100 %.    HEENT: No thrush or ulcers. Resp: Lungs clear bilaterally. Cardio: Regular rate and rhythm. GI: No hepatomegaly.  Left lower quadrant colostomy. Vascular: No leg edema. Neuro: Mild weakness proximal left leg. Skin: Palms without erythema. Port-A-Cath without erythema.  Lab Results:  Lab Results  Component Value Date   WBC 6.3 06/10/2022   HGB 10.6 (L) 06/10/2022   HCT 31.7 (L) 06/10/2022   MCV 91.6 06/10/2022   PLT 149 (L) 06/10/2022   NEUTROABS 4.6 06/10/2022    Imaging:  No results found.  Medications: I have reviewed the patient's current medications.  Assessment/Plan: Colorectal cancer 02/08/2022 CT abdomen/pelvis without contrast-severe circumferential wall thickening of the distal sigmoid colon and rectum; enlarged perirectal and low left retroperitoneal lymph nodes; multiple ill-defined hypodense liver lesions; trace ascites in the low pelvis.  02/08/2022 Colonoscopy by Dr. Charlyne Mom almost completely obstructing medium-sized mass in the rectosigmoid colon measuring from 12 to 17 cm.  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  Foundation  1-microsatellite stable, tumor mutation burden 5, [id number], K-ras wild-type 02/09/2022 CEA 3.2 CT chest 02/18/2022-chest adenopathy.  Multiple hypodense liver lesions. 02/20/2022 biopsy of liver lesion-fragments of benign liver parenchyma with reactive changes.  No evidence of malignancy. MRI abdomen 03/01/2022-numerous hypovascular hepatic lesions having imaging characteristics consider diagnostic of widespread metastatic disease to the liver.  Retroperitoneal lymphadenopathy concerning for metastatic disease. Cycle 1 FOLFOX 03/02/2022 03/11/2022 CTs abdomen/pelvis-distal large bowel obstruction at the site of a large annular mass at the rectosigmoid junction.  Progressive liver metastases. Compared to 4/23 CT. Progressive perirectal, left periaortic and right pericardiophrenic nodal metastases.  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 Cycle 2 FOLFOX 03/31/2022 Cycle 3 FOLFOX 04/13/2022 Cycle 4 FOLFOX delayed 04/28/2022 secondary to neutropenia and thrombocytopenia MRI lumbar spine 05/07/2022-pathologic compression fractures at L1 and L3, multiple additional hypointense lesions concerning for metastatic disease CT abdomen/pelvis 05/08/2022-decrease in size of liver metastases and pelvic/retroperitoneal adenopathy, stable prepericardial/juxta diaphragmatic nodes, new L5 metastasis, L1 and L3 compression fractures, L1 compression fracture present on 03/11/2022 CT Lumbar spine radiation 05/11/2022 - 05/25/2022, 3000 cGy in 10 fractions Cycle 4 FOLFOX 05/26/2022, Udenyca Cycle 5 FOLFOX 06/10/2022, Udenyca Cycle 6 FOLFOX 06/23/2022, Udenyca, oxaliplatin dose reduced due to thrombocytopenia, dexamethasone premedication decreased to 5 mg per patient request Rectal bleeding, lower abdominal pain, constipation secondary to #1 Multiple family members with cancer Hypercholesterolemia Port-A-Cath placement 02/20/2022 Hospital admission 03/11/2022-colonic obstruction secondary to colon cancer. Neutropenia  secondary to chemotherapy  Disposition: Karen Kaufman appears stable.  She has completed 5 cycles of FOLFOX.  Plan to proceed with cycle 6 today as scheduled.  Oxaliplatin will be dose reduced due to mild thrombocytopenia.  Per her request dexamethasone premedication will be decreased to  5 mg.  CBC and chemistry panel reviewed.  Labs adequate to proceed with treatment.  Oxaliplatin dose reduction as noted above.  She has seen Dr. Tammi Klippel and is scheduled to begin radiation T12-L4 beginning 07/01/2022.  Her husband has questions about the [facility] imaging.  I have communicated this to Freeman Caldron, PA with Dr. Tammi Klippel.  They will contact Karen Kaufman and her husband to discuss further.  She will return for lab, follow-up, FOLFOX in 2 weeks.  She will contact the office in the interim with any problems.  Patient seen with Dr. Benay Spice.    Ned Card ANP/GNP-BC   06/23/2022  8:45 AM This was a shared visit with Ned Card.  Karen Kaufman was interviewed and examined.  The left leg weakness improved with Decadron.  She developed symptoms of gastritis while on Decadron.  She is no longer taking Decadron.  She is scheduled for a course of palliative radiation beginning next week.  We discussed the treatment plan with Karen Kaufman and her husband.  She will continue FOLFOX for 2 more cycles prior to a restaging evaluation.  Oxaliplatin will be dose reduced secondary to thrombocytopenia.  I was present for greater than 50% of today's visit.  I performed medical decision making.  Karen Manson, MD

## 2022-06-23 NOTE — Progress Notes (Signed)
Patient seen by Ned Card NP today  Vitals are within treatment parameters.  Labs reviewed by Ned Card NP and are not all within treatment parameters. Platelets count is 89  Per physician team, patient is ready for treatment. Please note that modifications are being made to the treatment plan including decreasing oxaliplatin dose due to platelet count  and decreasing decadron to 5 mg per patient request.

## 2022-06-23 NOTE — Patient Instructions (Signed)

## 2022-06-24 ENCOUNTER — Other Ambulatory Visit: Payer: Self-pay

## 2022-06-24 DIAGNOSIS — C20 Malignant neoplasm of rectum: Secondary | ICD-10-CM | POA: Diagnosis not present

## 2022-06-25 ENCOUNTER — Inpatient Hospital Stay: Payer: BC Managed Care – PPO

## 2022-06-25 VITALS — BP 106/63 | HR 88 | Temp 97.8°F | Resp 17

## 2022-06-25 DIAGNOSIS — C20 Malignant neoplasm of rectum: Secondary | ICD-10-CM

## 2022-06-25 DIAGNOSIS — K565 Intestinal adhesions [bands], unspecified as to partial versus complete obstruction: Secondary | ICD-10-CM | POA: Diagnosis not present

## 2022-06-25 DIAGNOSIS — R109 Unspecified abdominal pain: Secondary | ICD-10-CM | POA: Diagnosis not present

## 2022-06-25 MED ORDER — HEPARIN SOD (PORK) LOCK FLUSH 100 UNIT/ML IV SOLN
500.0000 [IU] | Freq: Once | INTRAVENOUS | Status: AC | PRN
  Administered 2022-06-25: 500 [IU]

## 2022-06-25 MED ORDER — PEGFILGRASTIM-CBQV 6 MG/0.6ML ~~LOC~~ SOSY
6.0000 mg | PREFILLED_SYRINGE | Freq: Once | SUBCUTANEOUS | Status: AC
  Administered 2022-06-25: 6 mg via SUBCUTANEOUS
  Filled 2022-06-25: qty 0.6

## 2022-06-25 MED ORDER — SODIUM CHLORIDE 0.9% FLUSH
10.0000 mL | INTRAVENOUS | Status: DC | PRN
  Administered 2022-06-25: 10 mL

## 2022-06-26 ENCOUNTER — Other Ambulatory Visit: Payer: Self-pay

## 2022-06-26 ENCOUNTER — Ambulatory Visit
Admission: RE | Admit: 2022-06-26 | Discharge: 2022-06-26 | Disposition: A | Discharge status: Home / Self Care | Payer: BC Managed Care – PPO | Source: Ambulatory Visit | Attending: Radiation Oncology | Admitting: Radiation Oncology

## 2022-06-26 ENCOUNTER — Encounter (HOSPITAL_COMMUNITY): Payer: Self-pay

## 2022-06-26 ENCOUNTER — Inpatient Hospital Stay (HOSPITAL_COMMUNITY)
Admission: EM | Admit: 2022-06-26 | Discharge: 2022-06-29 | DRG: 389 | Disposition: A | Discharge status: Home / Self Care | Payer: BC Managed Care – PPO | Attending: Internal Medicine | Admitting: Internal Medicine

## 2022-06-26 DIAGNOSIS — D696 Thrombocytopenia, unspecified: Secondary | ICD-10-CM | POA: Diagnosis present

## 2022-06-26 DIAGNOSIS — D72829 Elevated white blood cell count, unspecified: Secondary | ICD-10-CM | POA: Diagnosis present

## 2022-06-26 DIAGNOSIS — G4700 Insomnia, unspecified: Secondary | ICD-10-CM | POA: Diagnosis present

## 2022-06-26 DIAGNOSIS — C20 Malignant neoplasm of rectum: Secondary | ICD-10-CM

## 2022-06-26 DIAGNOSIS — Z681 Body mass index (BMI) 19 or less, adult: Secondary | ICD-10-CM

## 2022-06-26 DIAGNOSIS — C787 Secondary malignant neoplasm of liver and intrahepatic bile duct: Secondary | ICD-10-CM | POA: Diagnosis present

## 2022-06-26 DIAGNOSIS — Z933 Colostomy status: Secondary | ICD-10-CM

## 2022-06-26 DIAGNOSIS — E871 Hypo-osmolality and hyponatremia: Secondary | ICD-10-CM | POA: Diagnosis present

## 2022-06-26 DIAGNOSIS — Z85048 Personal history of other malignant neoplasm of rectum, rectosigmoid junction, and anus: Secondary | ICD-10-CM

## 2022-06-26 DIAGNOSIS — Z8042 Family history of malignant neoplasm of prostate: Secondary | ICD-10-CM

## 2022-06-26 DIAGNOSIS — Z807 Family history of other malignant neoplasms of lymphoid, hematopoietic and related tissues: Secondary | ICD-10-CM

## 2022-06-26 DIAGNOSIS — Z803 Family history of malignant neoplasm of breast: Secondary | ICD-10-CM

## 2022-06-26 DIAGNOSIS — D6959 Other secondary thrombocytopenia: Secondary | ICD-10-CM | POA: Diagnosis present

## 2022-06-26 DIAGNOSIS — Z91013 Allergy to seafood: Secondary | ICD-10-CM

## 2022-06-26 DIAGNOSIS — K565 Intestinal adhesions [bands], unspecified as to partial versus complete obstruction: Principal | ICD-10-CM | POA: Diagnosis present

## 2022-06-26 DIAGNOSIS — C7951 Secondary malignant neoplasm of bone: Secondary | ICD-10-CM | POA: Diagnosis present

## 2022-06-26 DIAGNOSIS — K56609 Unspecified intestinal obstruction, unspecified as to partial versus complete obstruction: Principal | ICD-10-CM

## 2022-06-26 DIAGNOSIS — D649 Anemia, unspecified: Secondary | ICD-10-CM | POA: Diagnosis present

## 2022-06-26 DIAGNOSIS — E876 Hypokalemia: Secondary | ICD-10-CM | POA: Diagnosis present

## 2022-06-26 DIAGNOSIS — D6481 Anemia due to antineoplastic chemotherapy: Secondary | ICD-10-CM | POA: Diagnosis present

## 2022-06-26 DIAGNOSIS — E7800 Pure hypercholesterolemia, unspecified: Secondary | ICD-10-CM | POA: Diagnosis present

## 2022-06-26 DIAGNOSIS — Z9882 Breast implant status: Secondary | ICD-10-CM

## 2022-06-26 DIAGNOSIS — E441 Mild protein-calorie malnutrition: Secondary | ICD-10-CM | POA: Diagnosis present

## 2022-06-26 DIAGNOSIS — C772 Secondary and unspecified malignant neoplasm of intra-abdominal lymph nodes: Secondary | ICD-10-CM | POA: Diagnosis present

## 2022-06-26 HISTORY — DX: Malignant neoplasm of rectosigmoid junction: C19

## 2022-06-26 HISTORY — DX: Encounter for attention to colostomy: Z43.3

## 2022-06-26 LAB — URINALYSIS, ROUTINE W REFLEX MICROSCOPIC
Bilirubin Urine: NEGATIVE
Glucose, UA: NEGATIVE mg/dL
Hgb urine dipstick: NEGATIVE
Ketones, ur: 20 mg/dL — AB
Leukocytes,Ua: NEGATIVE
Nitrite: NEGATIVE
Protein, ur: NEGATIVE mg/dL
Specific Gravity, Urine: 1.017 (ref 1.005–1.030)
pH: 6 (ref 5.0–8.0)

## 2022-06-26 MED ORDER — SODIUM CHLORIDE 0.9% FLUSH
10.0000 mL | INTRAVENOUS | Status: DC | PRN
  Administered 2022-06-26: 10 mL via INTRAVENOUS

## 2022-06-26 MED ORDER — GADOBENATE DIMEGLUMINE 529 MG/ML IV SOLN
12.0000 mL | Freq: Once | INTRAVENOUS | Status: AC | PRN
Start: 2022-06-26 — End: 2022-06-26
  Administered 2022-06-26: 12 mL via INTRAVENOUS

## 2022-06-26 MED ORDER — HEPARIN SOD (PORK) LOCK FLUSH 100 UNIT/ML IV SOLN
500.0000 [IU] | Freq: Once | INTRAVENOUS | Status: AC
  Administered 2022-06-26: 500 [IU] via INTRAVENOUS

## 2022-06-26 MED ORDER — MORPHINE SULFATE (PF) 4 MG/ML IV SOLN
4.0000 mg | Freq: Once | INTRAVENOUS | Status: AC
  Administered 2022-06-26: 4 mg via INTRAVENOUS
  Filled 2022-06-26: qty 1

## 2022-06-26 MED ORDER — ONDANSETRON HCL 4 MG/2ML IJ SOLN
4.0000 mg | Freq: Once | INTRAMUSCULAR | Status: AC
  Administered 2022-06-26: 4 mg via INTRAVENOUS
  Filled 2022-06-26: qty 2

## 2022-06-26 NOTE — ED Provider Triage Note (Signed)
Emergency Medicine Provider Triage Evaluation Note  Karen Kaufman , a 61 y.o. female  was evaluated in triage.  Pt complains of abd pain that is severe.  No NV. Last chemo Thursday.   No CP or SOB.   Review of Systems  Positive: Abd pain Negative: Fever   Physical Exam  BP 124/79   Pulse (!) 109   Temp (!) 97.5 F (36.4 C) (Oral)   Resp 20   Ht '5\' 6"'$  (1.676 m)   Wt 54.4 kg   SpO2 99%   BMI 19.37 kg/m  Gen:   Awake, uncomfortable Resp:  Normal effort  MSK:   Moves extremities without difficulty  Other:  Diffuse abd TTP  Medical Decision Making  Medically screening exam initiated at 10:18 PM.  Appropriate orders placed.  Karen Kaufman was informed that the remainder of the evaluation will be completed by another provider, this initial triage assessment does not replace that evaluation, and the importance of remaining in the ED until their evaluation is complete.  Labs, CT   Pati Gallo [place], [place] 06/26/22 2220

## 2022-06-26 NOTE — ED Triage Notes (Signed)
Pt reports with abdominal pain that started after lunch. Pt has a colostomy bag in place.

## 2022-06-27 ENCOUNTER — Encounter: Payer: Self-pay | Admitting: Internal Medicine

## 2022-06-27 ENCOUNTER — Inpatient Hospital Stay (HOSPITAL_COMMUNITY): Payer: BC Managed Care – PPO

## 2022-06-27 ENCOUNTER — Emergency Department (HOSPITAL_COMMUNITY): Payer: BC Managed Care – PPO

## 2022-06-27 ENCOUNTER — Encounter (HOSPITAL_COMMUNITY): Payer: Self-pay

## 2022-06-27 DIAGNOSIS — C787 Secondary malignant neoplasm of liver and intrahepatic bile duct: Secondary | ICD-10-CM | POA: Diagnosis present

## 2022-06-27 DIAGNOSIS — Z9882 Breast implant status: Secondary | ICD-10-CM | POA: Diagnosis not present

## 2022-06-27 DIAGNOSIS — Z933 Colostomy status: Secondary | ICD-10-CM | POA: Diagnosis not present

## 2022-06-27 DIAGNOSIS — E876 Hypokalemia: Secondary | ICD-10-CM | POA: Diagnosis present

## 2022-06-27 DIAGNOSIS — R109 Unspecified abdominal pain: Secondary | ICD-10-CM | POA: Diagnosis present

## 2022-06-27 DIAGNOSIS — D72829 Elevated white blood cell count, unspecified: Secondary | ICD-10-CM | POA: Diagnosis present

## 2022-06-27 DIAGNOSIS — Z803 Family history of malignant neoplasm of breast: Secondary | ICD-10-CM | POA: Diagnosis not present

## 2022-06-27 DIAGNOSIS — D649 Anemia, unspecified: Secondary | ICD-10-CM | POA: Diagnosis present

## 2022-06-27 DIAGNOSIS — Z85048 Personal history of other malignant neoplasm of rectum, rectosigmoid junction, and anus: Secondary | ICD-10-CM | POA: Diagnosis not present

## 2022-06-27 DIAGNOSIS — C7951 Secondary malignant neoplasm of bone: Secondary | ICD-10-CM | POA: Diagnosis present

## 2022-06-27 DIAGNOSIS — Z681 Body mass index (BMI) 19 or less, adult: Secondary | ICD-10-CM | POA: Diagnosis not present

## 2022-06-27 DIAGNOSIS — C772 Secondary and unspecified malignant neoplasm of intra-abdominal lymph nodes: Secondary | ICD-10-CM | POA: Diagnosis present

## 2022-06-27 DIAGNOSIS — G4700 Insomnia, unspecified: Secondary | ICD-10-CM | POA: Diagnosis present

## 2022-06-27 DIAGNOSIS — Z8042 Family history of malignant neoplasm of prostate: Secondary | ICD-10-CM | POA: Diagnosis not present

## 2022-06-27 DIAGNOSIS — Z91013 Allergy to seafood: Secondary | ICD-10-CM | POA: Diagnosis not present

## 2022-06-27 DIAGNOSIS — D6959 Other secondary thrombocytopenia: Secondary | ICD-10-CM | POA: Diagnosis present

## 2022-06-27 DIAGNOSIS — Z807 Family history of other malignant neoplasms of lymphoid, hematopoietic and related tissues: Secondary | ICD-10-CM | POA: Diagnosis not present

## 2022-06-27 DIAGNOSIS — K56609 Unspecified intestinal obstruction, unspecified as to partial versus complete obstruction: Secondary | ICD-10-CM

## 2022-06-27 DIAGNOSIS — D696 Thrombocytopenia, unspecified: Secondary | ICD-10-CM | POA: Diagnosis present

## 2022-06-27 DIAGNOSIS — K565 Intestinal adhesions [bands], unspecified as to partial versus complete obstruction: Secondary | ICD-10-CM | POA: Diagnosis present

## 2022-06-27 DIAGNOSIS — E7800 Pure hypercholesterolemia, unspecified: Secondary | ICD-10-CM | POA: Diagnosis present

## 2022-06-27 DIAGNOSIS — E441 Mild protein-calorie malnutrition: Secondary | ICD-10-CM | POA: Diagnosis present

## 2022-06-27 DIAGNOSIS — D6481 Anemia due to antineoplastic chemotherapy: Secondary | ICD-10-CM | POA: Diagnosis present

## 2022-06-27 DIAGNOSIS — E871 Hypo-osmolality and hyponatremia: Secondary | ICD-10-CM | POA: Diagnosis present

## 2022-06-27 DIAGNOSIS — C20 Malignant neoplasm of rectum: Secondary | ICD-10-CM | POA: Diagnosis present

## 2022-06-27 LAB — COMPREHENSIVE METABOLIC PANEL
ALT: 51 U/L — ABNORMAL HIGH (ref 0–44)
AST: 39 U/L (ref 15–41)
Albumin: 3.1 g/dL — ABNORMAL LOW (ref 3.5–5.0)
Alkaline Phosphatase: 277 U/L — ABNORMAL HIGH (ref 38–126)
Anion gap: 7 (ref 5–15)
BUN: 9 mg/dL (ref 8–23)
CO2: 27 mmol/L (ref 22–32)
Calcium: 8.2 mg/dL — ABNORMAL LOW (ref 8.9–10.3)
Chloride: 105 mmol/L (ref 98–111)
Creatinine, Ser: 0.33 mg/dL — ABNORMAL LOW (ref 0.44–1.00)
GFR, Estimated: >60 mL/min (ref >60)
Glucose, Bld: 123 mg/dL — ABNORMAL HIGH (ref 70–99)
Potassium: 3.8 mmol/L (ref 3.5–5.1)
Sodium: 139 mmol/L (ref 135–145)
Total Bilirubin: 0.7 mg/dL (ref 0.3–1.2)
Total Protein: 5.3 g/dL — ABNORMAL LOW (ref 6.5–8.1)

## 2022-06-27 LAB — CBC
HCT: 35.4 % — ABNORMAL LOW (ref 36.0–46.0)
Hemoglobin: 11.6 g/dL — ABNORMAL LOW (ref 12.0–15.0)
MCH: 31.5 pg (ref 26.0–34.0)
MCHC: 32.8 g/dL (ref 30.0–36.0)
MCV: 96.2 fL (ref 80.0–100.0)
Platelets: 97 10*3/uL — ABNORMAL LOW (ref 150–400)
RBC: 3.68 MIL/uL — ABNORMAL LOW (ref 3.87–5.11)
RDW: 17.1 % — ABNORMAL HIGH (ref 11.5–15.5)
WBC: 49.7 10*3/uL — ABNORMAL HIGH (ref 4.0–10.5)
nRBC: 0 % (ref 0.0–0.2)

## 2022-06-27 LAB — LIPASE, BLOOD: Lipase: 24 U/L (ref 11–51)

## 2022-06-27 LAB — PHOSPHORUS: Phosphorus: 3.7 mg/dL (ref 2.5–4.6)

## 2022-06-27 LAB — MAGNESIUM: Magnesium: 2.5 mg/dL — ABNORMAL HIGH (ref 1.7–2.4)

## 2022-06-27 MED ORDER — LACTATED RINGERS IV BOLUS: 1000.0000 mL | Freq: Once | INTRAVENOUS | Status: AC

## 2022-06-27 MED ORDER — SODIUM CHLORIDE 0.9 % IV SOLN: INTRAVENOUS | Status: DC | PRN

## 2022-06-27 MED ORDER — ALUM & MAG HYDROXIDE-SIMETH 200-200-20 MG/5ML PO SUSP
30.0000 mL | Freq: Four times a day (QID) | ORAL | Status: DC | PRN
  Administered 2022-06-28: 30 mL via ORAL
  Filled 2022-06-27: qty 30

## 2022-06-27 MED ORDER — SODIUM CHLORIDE 0.9 % IV SOLN: 8.0000 mg | Freq: Four times a day (QID) | INTRAVENOUS | Status: DC | PRN

## 2022-06-27 MED ORDER — PIPERACILLIN-TAZOBACTAM 3.375 G IVPB
3.3750 g | Freq: Three times a day (TID) | INTRAVENOUS | Status: DC
  Administered 2022-06-27 – 2022-06-29 (×6): 3.375 g via INTRAVENOUS
  Filled 2022-06-27 (×6): qty 50

## 2022-06-27 MED ORDER — MAGIC MOUTHWASH: 15.0000 mL | Freq: Four times a day (QID) | ORAL | Status: DC | PRN

## 2022-06-27 MED ORDER — DIATRIZOATE MEGLUMINE & SODIUM 66-10 % PO SOLN
90.0000 mL | Freq: Once | ORAL | Status: AC
Start: 2022-06-27 — End: 2022-06-27
  Administered 2022-06-27: 90 mL via NASOGASTRIC
  Filled 2022-06-27: qty 90

## 2022-06-27 MED ORDER — SIMETHICONE 40 MG/0.6ML PO SUSP: 80.0000 mg | Freq: Four times a day (QID) | ORAL | Status: DC | PRN

## 2022-06-27 MED ORDER — ONDANSETRON HCL 4 MG/2ML IJ SOLN
4.0000 mg | Freq: Once | INTRAMUSCULAR | Status: AC
  Administered 2022-06-27: 4 mg via INTRAVENOUS
  Filled 2022-06-27: qty 2

## 2022-06-27 MED ORDER — HYDROMORPHONE HCL 1 MG/ML IJ SOLN
0.5000 mg | INTRAMUSCULAR | Status: DC | PRN
  Administered 2022-06-27: 2 mg via INTRAVENOUS
  Administered 2022-06-27 – 2022-06-28 (×5): 1 mg via INTRAVENOUS
  Filled 2022-06-27: qty 2
  Filled 2022-06-27 (×6): qty 1

## 2022-06-27 MED ORDER — CHLORHEXIDINE GLUCONATE CLOTH 2 % EX PADS
6.0000 | MEDICATED_PAD | Freq: Every day | CUTANEOUS | Status: DC
  Administered 2022-06-28 – 2022-06-29 (×2): 6 via TOPICAL

## 2022-06-27 MED ORDER — PHENOL 1.4 % MT LIQD
2.0000 | OROMUCOSAL | Status: DC | PRN
  Filled 2022-06-27: qty 177

## 2022-06-27 MED ORDER — FENTANYL CITRATE PF 50 MCG/ML IJ SOSY
100.0000 ug | PREFILLED_SYRINGE | Freq: Once | INTRAMUSCULAR | Status: AC
  Administered 2022-06-27: 100 ug via INTRAVENOUS
  Filled 2022-06-27: qty 2

## 2022-06-27 MED ORDER — DIPHENHYDRAMINE HCL 50 MG/ML IJ SOLN: 12.5000 mg | Freq: Four times a day (QID) | INTRAMUSCULAR | Status: DC | PRN

## 2022-06-27 MED ORDER — MORPHINE SULFATE (PF) 2 MG/ML IV SOLN
2.0000 mg | Freq: Once | INTRAVENOUS | Status: AC
  Administered 2022-06-27: 2 mg via INTRAVENOUS
  Filled 2022-06-27: qty 1

## 2022-06-27 MED ORDER — POTASSIUM CHLORIDE IN NACL 20-0.9 MEQ/L-% IV SOLN
INTRAVENOUS | Status: DC
  Filled 2022-06-27 (×3): qty 1000

## 2022-06-27 MED ORDER — ONDANSETRON HCL 4 MG/2ML IJ SOLN
4.0000 mg | Freq: Four times a day (QID) | INTRAMUSCULAR | Status: DC | PRN
  Administered 2022-06-27: 4 mg via INTRAVENOUS
  Filled 2022-06-27: qty 2

## 2022-06-27 MED ORDER — LACTATED RINGERS IV BOLUS: 1000.0000 mL | Freq: Three times a day (TID) | INTRAVENOUS | Status: DC | PRN

## 2022-06-27 MED ORDER — ACETAMINOPHEN 325 MG PO TABS: 650.0000 mg | ORAL_TABLET | Freq: Four times a day (QID) | ORAL | Status: DC | PRN

## 2022-06-27 MED ORDER — IOHEXOL 300 MG/ML  SOLN
100.0000 mL | Freq: Once | INTRAMUSCULAR | Status: AC | PRN
  Administered 2022-06-27: 100 mL via INTRAVENOUS

## 2022-06-27 MED ORDER — MENTHOL 3 MG MT LOZG: 1.0000 | LOZENGE | OROMUCOSAL | Status: DC | PRN

## 2022-06-27 MED ORDER — PROCHLORPERAZINE EDISYLATE 10 MG/2ML IJ SOLN: 5.0000 mg | INTRAMUSCULAR | Status: DC | PRN

## 2022-06-27 MED ORDER — ACETAMINOPHEN 650 MG RE SUPP: 650.0000 mg | Freq: Four times a day (QID) | RECTAL | Status: DC | PRN

## 2022-06-27 MED ORDER — LACTATED RINGERS IV BOLUS
1000.0000 mL | Freq: Once | INTRAVENOUS | Status: AC
  Administered 2022-06-27: 1000 mL via INTRAVENOUS

## 2022-06-27 MED ORDER — SODIUM CHLORIDE 0.9 % IV BOLUS
1000.0000 mL | Freq: Once | INTRAVENOUS | Status: AC
  Administered 2022-06-27: 1000 mL via INTRAVENOUS

## 2022-06-27 MED ORDER — METHOCARBAMOL 1000 MG/10ML IJ SOLN
1000.0000 mg | Freq: Four times a day (QID) | INTRAVENOUS | Status: DC | PRN
  Administered 2022-06-28: 1000 mg via INTRAVENOUS
  Filled 2022-06-27: qty 1000

## 2022-06-27 MED ORDER — LIP MEDEX EX OINT
TOPICAL_OINTMENT | Freq: Two times a day (BID) | CUTANEOUS | Status: DC
  Administered 2022-06-28: 75 via TOPICAL
  Filled 2022-06-27: qty 7

## 2022-06-27 MED ORDER — HYDROMORPHONE HCL 1 MG/ML IJ SOLN
0.5000 mg | INTRAMUSCULAR | Status: DC | PRN
  Administered 2022-06-27 (×2): 0.5 mg via INTRAVENOUS
  Filled 2022-06-27 (×2): qty 1

## 2022-06-27 NOTE — ED Notes (Signed)
Pt refused NG placement until after speaking with the admitting MD.

## 2022-06-27 NOTE — Consult Note (Addendum)
Reason for Consult: SBO Referring Physician: Christy Gentles  Karen Kaufman is an 61 y.o. female.  HPI:  Patient is a 61 year old female diagnosed with metastatic rectal cancer earlier this year.  She has mets to the liver, retroperitoneal lymph nodes, and the lumbar spine.  She has been undergoing chemotherapy and had a loop colostomy placed by Dr. Johney Maine in the mid descending colon in May 2023.  She comes to the emergency department on 9/8 with sudden onset of acute abdominal pain at 3 PM.  This was day 1 after chemotherapy.  She is on FOLFOX.  She is not on Avastin.  She also has had some nausea but no vomiting.  She denies fevers and chills.  She has an extremely high white count.  CT scan showed high-grade bowel obstruction that appears to be in the left lower quadrant.  This is not at the site of the ostomy or the primary tumor.  There is some bowel inflammation in this region.  She still has some stool in her ostomy bag, but she has not had any air today.  Past Medical History:  Diagnosis Date   Colorectal cancer [facility])    Colostomy care [facility])     History reviewed.  H/o crani 1995 TAH 2002 Lap loop descending colostomy 02/2022 Breast implants   History reviewed. No pertinent family history.\ Mother- breast cancer Father- prostate cancer MGF- hodgkin's lymphoma  Social History:  reports that she has never smoked. She has never used smokeless tobacco. She reports that she does not drink alcohol and does not use drugs.  Allergies:  Allergies  Allergen Reactions   Shellfish Allergy Anaphylaxis    Medications:  atorvastatin (LIPITOR) 20 MG tablet famotidine (PEPCID) 20 MG tablet HYDROcodone-acetaminophen (NORCO) 5-325 MG tablet lidocaine-prilocaine (EMLA) cream magnesium oxide (MAG-OX) 400 MG tablet ondansetron (ZOFRAN) 8 MG tablet pantoprazole (PROTONIX) 20 MG tablet polyethylene glycol (MIRALAX / GLYCOLAX) 17 g packet prochlorperazine (COMPAZINE) 10 MG tablet   Results for  orders placed or performed during the hospital encounter of 06/26/22 (from the past 48 hour(s))  Urinalysis, Routine w reflex microscopic Urine, Clean Catch     Status: Abnormal   Collection Time: 06/26/22 10:58 PM  Result Value Ref Range   Color, Urine YELLOW YELLOW   APPearance CLEAR CLEAR   Specific Gravity, Urine 1.017 1.005 - 1.030   pH 6.0 5.0 - 8.0   Glucose, UA NEGATIVE NEGATIVE mg/dL   Hgb urine dipstick NEGATIVE NEGATIVE   Bilirubin Urine NEGATIVE NEGATIVE   Ketones, ur 20 (A) NEGATIVE mg/dL   Protein, ur NEGATIVE NEGATIVE mg/dL   Nitrite NEGATIVE NEGATIVE   Leukocytes,Ua NEGATIVE NEGATIVE    Comment: Performed at [facility], [place] [address], [place], [place] [address]  Lipase, blood     Status: None   Collection Time: 06/26/22 11:56 PM  Result Value Ref Range   Lipase 24 11 - 51 U/L    Comment: Performed at [facility], [place] [address], [place], [place] [address]  Comprehensive metabolic panel     Status: Abnormal   Collection Time: 06/26/22 11:56 PM  Result Value Ref Range   Sodium 139 135 - 145 mmol/L   Potassium 3.8 3.5 - 5.1 mmol/L   Chloride 105 98 - 111 mmol/L   CO2 27 22 - 32 mmol/L   Glucose, Bld 123 (H) 70 - 99 mg/dL    Comment: Glucose reference range applies only to samples taken after fasting for at least 8 hours.  BUN 9 8 - 23 mg/dL   Creatinine, Ser 0.33 (L) 0.44 - 1.00 mg/dL   Calcium 8.2 (L) 8.9 - 10.3 mg/dL   Total Protein 5.3 (L) 6.5 - 8.1 g/dL   Albumin 3.1 (L) 3.5 - 5.0 g/dL   AST 39 15 - 41 U/L   ALT 51 (H) 0 - 44 U/L   Alkaline Phosphatase 277 (H) 38 - 126 U/L   Total Bilirubin 0.7 0.3 - 1.2 mg/dL   GFR, Estimated >60 >60 mL/min    Comment: (NOTE) Calculated using the CKD-EPI Creatinine Equation (2021)    Anion gap 7 5 - 15    Comment: Performed at [facility], [place] [address], [place], [place] [address]  CBC     Status: Abnormal   Collection Time: 06/26/22 11:56 PM  Result Value  Ref Range   WBC 49.7 (H) 4.0 - 10.5 K/uL   RBC 3.68 (L) 3.87 - 5.11 MIL/uL   Hemoglobin 11.6 (L) 12.0 - 15.0 g/dL   HCT 35.4 (L) 36.0 - 46.0 %   MCV 96.2 80.0 - 100.0 fL   MCH 31.5 26.0 - 34.0 pg   MCHC 32.8 30.0 - 36.0 g/dL   RDW 17.1 (H) 11.5 - 15.5 %   Platelets 97 (L) 150 - 400 K/uL    Comment: Immature Platelet Fraction may be clinically indicated, consider ordering this additional test [id number]    nRBC 0.0 0.0 - 0.2 %    Comment: Performed at [facility], [place] [address] [place], [place] [address]    CT ABDOMEN PELVIS W CONTRAST  Result Date: 06/27/2022 CLINICAL DATA:  Acute abdominal pain with history of colorectal cancer and colostomy. EXAM: CT ABDOMEN AND PELVIS WITH CONTRAST TECHNIQUE: Multidetector CT imaging of the abdomen and pelvis was performed using the standard protocol following bolus administration of intravenous contrast. RADIATION DOSE REDUCTION: This exam was performed according to the departmental dose-optimization program which includes automated exposure control, adjustment of the mA and/or kV according to patient size and/or use of iterative reconstruction technique. CONTRAST:  161m OMNIPAQUE IOHEXOL 300 MG/ML  SOLN COMPARISON:  CT with oral contrast only 05/08/2022, CTs without contrast 03/11/2022 and 02/08/2022. FINDINGS: Lower chest: Lung bases are clear apart from minimal posterior atelectasis. Epicardial lymph nodes to the right are again noted up to 8.6 mm in short axis but have not increased in size. There are bilateral breast implants partially visible. Hepatobiliary: In the lateral liver dome on 2:12 there is a hypodense lesion compatible with metastasis measuring 1.6 cm in segment 7. This was larger previously, particularly on 03/11/2022 when it measured 2.4 cm. On axial images 22 and 23 there are additional hypodense lesions inferiorly in segment 6, both approaching 1 cm which were also previously about double the size. There are few  scattered hypodensities which are too small to characterize and a small cyst in the left lobe anteriorly. There are small stones in the gallbladder but no wall thickening or biliary dilatation. Pancreas: Unremarkable. Spleen: Unremarkable. Adrenals/Urinary Tract: There is no adrenal mass. In the lower pole right kidney there is a 1.6 cm cyst of 23 Hounsfield units. No follow-up imaging is required. Both kidneys otherwise enhance normally. There is no urinary stone or obstruction. No bladder thickening. Stomach/Bowel: Output of the distal descending colon through a left mid abdominal diverting loop colostomy. Thickened folds are chronically noted in the jejunum. Beginning at the ligament of Treitz there is small bowel dilatation in the left upper to  mid and lower abdomen with small bowel segments up to 4.1 cm. The transitional segment is a sharply angulated loop anteriorly in the mid to lower abdomen best seen on axial images 42-45 of series 2, with thickened folds at the transition. The rest of the small bowel is collapsed in the lower abdomen and upper pelvis. The appendix is normal. Large bowel unremarkable except at the rectosigmoid junction where circumferential wall thickening continues to be seen with perirectal stranding. Presacral stranding and mild layering presacral fluid also continue to be noted. There is increased scattered ascites along the mesenteric folds in the mid to lower abdomen as well. There is no bowel pneumatosis or portal venous gas. Vascular/Lymphatic: No significant vascular findings. Left perirectal lymph nodes are again noted up to 8.2 mm in short axis, slightly improved. No adenopathy is seen elsewhere in the pelvis or in the abdomen. Reproductive: Status post hysterectomy. No adnexal masses. Other: As above there is scattered ascites in the abdomen and pelvis. There is no free hemorrhage, abscess or free air. Musculoskeletal: Bony sclerosis and mild-to-moderate pathologic compression  fractures are again noted of L1 and 3. Lytic metastasis in the posterior L5 body measuring 1.8 cm is less lucent than previously consistent with a treated metastasis. Mixed attenuation sclerosis and lucency is redemonstrated in the left pubic bone with a new lytic lesion measuring 1.9 cm in the left superior pubic ramus on 2:72. Also, a sclerotic lesion in the right hemisacrum on 2:51 now measures 1.2 cm and was previously 7 mm, with similar interval enlargement of a left sacral sclerotic lesion measuring 2 cm on 2:54, previously 1.5 cm. There are probably other lesions in multiple locations which are too small to characterize. The bones in general appears subtly denser than previously. IMPRESSION: 1. High-grade small-bowel obstruction transitioning in the anterior mid to lower abdomen in the proximal ileum, with a sharply angulated transitional segment with surrounding mesenteric edema. Etiology is most likely adhesive disease. Wall thickening of the transitional segment could be related to enteritis or ischemia but there is no pneumatosis. 2. Increased scattered ascites in the abdomen and pelvis, mild amount. 3. Diverting loop colostomy left lower abdomen with similar appearance of rectosigmoid junction thickening, adjacent stranding and adjacent presacral fluid and soft tissue thickening. 4. Left perirectal lymph nodes stable to slightly improved since 05/08/2022 but significantly smaller than on the earlier studies. 5. Shrinking hepatic metastases, but at the same time evidence of progression in osseous metastatic disease with a new lesion in the left superior pubic ramus and enlarging lesions in the sacrum. 6. Cholelithiasis. 7. Chronic thickened folds in the jejunum. Electronically Signed   By: Telford Nab M.D.   On: 06/27/2022 02:26    Review of Systems  Constitutional:  Positive for appetite change (no appetite).  HENT: Negative.    Eyes: Negative.   Respiratory: Negative.    Cardiovascular:  Negative.   Gastrointestinal:  Positive for abdominal distention and abdominal pain. Negative for blood in stool and constipation.  Endocrine: Negative.   Genitourinary: Negative.   Musculoskeletal:  Positive for back pain.  Skin:  Positive for pallor.  Allergic/Immunologic: Negative.   Neurological:  Positive for weakness.  Psychiatric/Behavioral: Negative.    All other systems reviewed and are negative.  Blood pressure 108/63, pulse 79, temperature (!) 97.5 F (36.4 C), resp. rate 18, height '5\' 6"'$  (1.676 m), weight 54.4 kg, SpO2 96 %. Physical Exam Vitals reviewed.  Constitutional:      General: She is in acute  distress.     Appearance: She is ill-appearing. She is not toxic-appearing or diaphoretic.  HENT:     Head: Normocephalic and atraumatic.  Eyes:     General: No scleral icterus.    Pupils: Pupils are equal, round, and reactive to light.  Cardiovascular:     Rate and Rhythm: Normal rate and regular rhythm.     Heart sounds: Normal heart sounds.  Pulmonary:     Effort: Pulmonary effort is normal. No respiratory distress.     Breath sounds: Normal breath sounds. No stridor. No wheezing.  Abdominal:     General: Bowel sounds are absent. There is distension (mild).     Palpations: Abdomen is soft. There is no shifting dullness, fluid wave, hepatomegaly or splenomegaly.     Tenderness: There is abdominal tenderness (right abdomen primarily). There is guarding. There is no rebound.     Hernia: No hernia is present.  Skin:    General: Skin is warm and dry.     Capillary Refill: Capillary refill takes 2 to 3 seconds.     Coloration: Skin is pale. Skin is not jaundiced or mottled.     Findings: No erythema or rash.  Neurological:     General: No focal deficit present.     Mental Status: She is alert and oriented to person, place, and time.  Psychiatric:        Mood and Affect: Mood normal. Mood is not anxious or depressed.        Behavior: Behavior normal.      Assessment/Plan:  Metastatic rectal cancer Diverting loop colostomy in place SBO Leukocytosis Anemia Receiving chemotherapy  Will plan for small bowel protocol with NGT/bowel rest/concentrated contrast Increased pain medication Likely to resolve without surgery. Adding zosyn for inflamed bowel and risk of translocation.   Repeat plain films tomorrow as well.   No evidence of carcinomatosis on scan or at last operative intervention, but this is always a concern.    Would try to hold off on surgery unless necessary as this would delay her chemo more.   Surgery to follow.      Milus Height, MD FACS Surgical Oncology, General Surgery, Trauma and [place] [facility], [place] for weekday/non holidays Check [email or web address] for coverage night/weekend/holidays

## 2022-06-27 NOTE — Progress Notes (Signed)
Pharmacy Antibiotic Note  Karen Kaufman is a 61 y.o. female admitted on 06/26/2022 with intra-abdominal infection.  Pharmacy has been consulted for Zosyn dosing.  Plan: Zosyn 3.375gm IV q8h (4hr extended infusions) No dose adjustments anticipated.  Pharmacy will sign off and monitor peripherally via electronic surveillance software for any changes in renal function or micro data.   Height: '5\' 6"'$  (167.6 cm) Weight: 54.4 kg (120 lb) IBW/kg (Calculated) : 59.3  Temp (24hrs), Avg:97.6 F (36.4 C), Min:97.5 F (36.4 C), Max:97.7 F (36.5 C)  Recent Labs  Lab 06/26/22 2356  WBC 49.7*  CREATININE 0.33*    Estimated Creatinine Clearance: 62.6 mL/min (A) (by C-G formula based on SCr of 0.33 mg/dL (L)).    Allergies  Allergen Reactions   Shellfish Allergy Anaphylaxis    Antimicrobials this admission: 9/9 Zosyn >>  Dose adjustments this admission: None  Microbiology results: None  Thank you for allowing pharmacy to be a part of this patient's care.  Peggyann Juba, PharmD, BCPS Pharmacy: [phone] 06/27/2022 11:16 AM

## 2022-06-27 NOTE — ED Provider Notes (Signed)
[facility] DEPT Provider Note   CSN: [id number] Arrival date & time: 06/26/22  2129     History  Chief Complaint  Patient presents with   Abdominal Pain    Karen Kaufman is a 61 y.o. female.   Abdominal Pain    Patient with history of metastatic rectal cancer presents with abdominal pain.  Patient reports soon after eating around 3 PM she began having diffuse abdominal pain with nausea.  No vomiting or fever.  She reports decreased output in her colostomy.  She has had a colostomy since May.  No bloody stool output.  No chest pain or shortness of breath. She reports she just came off a chemo cycle the previous day  Home Medications Prior to Admission medications   Not on File      Allergies    Shellfish allergy    Review of Systems   Review of Systems  Gastrointestinal:  Positive for abdominal pain.    Physical Exam Updated Vital Signs BP 106/60   Pulse 83   Temp (!) 97.5 F (36.4 C) (Oral)   Resp 20   Ht 1.676 m ('5\' 6"'$ )   Wt 54.4 kg   SpO2 100%   BMI 19.37 kg/m  Physical Exam CONSTITUTIONAL: Well developed/well nourished, uncomfortable appearing HEAD: Normocephalic/atraumatic EYES: EOMI ENMT: Mucous membranes moist NECK: supple no meningeal signs CV: S1/S2 noted, no murmurs/rubs/gallops noted LUNGS: Lungs are clear to auscultation bilaterally, no apparent distress ABDOMEN: soft, diffuse moderate tenderness, no rebound or guarding, bowel sounds noted throughout abdomen Colostomy is in place GU:no cva tenderness NEURO: Pt is awake/alert/appropriate, moves all extremitiesx4.  No facial droop.   EXTREMITIES:full ROM SKIN: warm, color normal PSYCH: no abnormalities of mood noted, alert and oriented to situation  ED Results / Procedures / Treatments   Labs (all labs ordered are listed, but only abnormal results are displayed) Labs Reviewed  COMPREHENSIVE METABOLIC PANEL - Abnormal; Notable for the following components:       Result Value   Glucose, Bld 123 (*)    Creatinine, Ser 0.33 (*)    Calcium 8.2 (*)    Total Protein 5.3 (*)    Albumin 3.1 (*)    ALT 51 (*)    Alkaline Phosphatase 277 (*)    All other components within normal limits  CBC - Abnormal; Notable for the following components:   WBC 49.7 (*)    RBC 3.68 (*)    Hemoglobin 11.6 (*)    HCT 35.4 (*)    RDW 17.1 (*)    Platelets 97 (*)    All other components within normal limits  URINALYSIS, ROUTINE W REFLEX MICROSCOPIC - Abnormal; Notable for the following components:   Ketones, ur 20 (*)    All other components within normal limits  LIPASE, BLOOD    EKG EKG Interpretation  Date/Time:  Friday June 26 2022 23:38:44 EDT Ventricular Rate:  90 PR Interval:  139 QRS Duration: 74 QT Interval:  362 QTC Calculation: 443 R Axis:   62 Text Interpretation: Sinus rhythm Abnormal R-wave progression, early transition Confirmed by Ripley Fraise [phone]) on 06/26/2022 11:58:30 PM  Radiology CT ABDOMEN PELVIS W CONTRAST  Result Date: 06/27/2022 CLINICAL DATA:  Acute abdominal pain with history of colorectal cancer and colostomy. EXAM: CT ABDOMEN AND PELVIS WITH CONTRAST TECHNIQUE: Multidetector CT imaging of the abdomen and pelvis was performed using the standard protocol following bolus administration of intravenous contrast. RADIATION DOSE REDUCTION: This exam was performed according  to the departmental dose-optimization program which includes automated exposure control, adjustment of the mA and/or kV according to patient size and/or use of iterative reconstruction technique. CONTRAST:  153m OMNIPAQUE IOHEXOL 300 MG/ML  SOLN COMPARISON:  CT with oral contrast only 05/08/2022, CTs without contrast 03/11/2022 and 02/08/2022. FINDINGS: Lower chest: Lung bases are clear apart from minimal posterior atelectasis. Epicardial lymph nodes to the right are again noted up to 8.6 mm in short axis but have not increased in size. There are bilateral breast  implants partially visible. Hepatobiliary: In the lateral liver dome on 2:12 there is a hypodense lesion compatible with metastasis measuring 1.6 cm in segment 7. This was larger previously, particularly on 03/11/2022 when it measured 2.4 cm. On axial images 22 and 23 there are additional hypodense lesions inferiorly in segment 6, both approaching 1 cm which were also previously about double the size. There are few scattered hypodensities which are too small to characterize and a small cyst in the left lobe anteriorly. There are small stones in the gallbladder but no wall thickening or biliary dilatation. Pancreas: Unremarkable. Spleen: Unremarkable. Adrenals/Urinary Tract: There is no adrenal mass. In the lower pole right kidney there is a 1.6 cm cyst of 23 Hounsfield units. No follow-up imaging is required. Both kidneys otherwise enhance normally. There is no urinary stone or obstruction. No bladder thickening. Stomach/Bowel: Output of the distal descending colon through a left mid abdominal diverting loop colostomy. Thickened folds are chronically noted in the jejunum. Beginning at the ligament of Treitz there is small bowel dilatation in the left upper to mid and lower abdomen with small bowel segments up to 4.1 cm. The transitional segment is a sharply angulated loop anteriorly in the mid to lower abdomen best seen on axial images 42-45 of series 2, with thickened folds at the transition. The rest of the small bowel is collapsed in the lower abdomen and upper pelvis. The appendix is normal. Large bowel unremarkable except at the rectosigmoid junction where circumferential wall thickening continues to be seen with perirectal stranding. Presacral stranding and mild layering presacral fluid also continue to be noted. There is increased scattered ascites along the mesenteric folds in the mid to lower abdomen as well. There is no bowel pneumatosis or portal venous gas. Vascular/Lymphatic: No significant vascular  findings. Left perirectal lymph nodes are again noted up to 8.2 mm in short axis, slightly improved. No adenopathy is seen elsewhere in the pelvis or in the abdomen. Reproductive: Status post hysterectomy. No adnexal masses. Other: As above there is scattered ascites in the abdomen and pelvis. There is no free hemorrhage, abscess or free air. Musculoskeletal: Bony sclerosis and mild-to-moderate pathologic compression fractures are again noted of L1 and 3. Lytic metastasis in the posterior L5 body measuring 1.8 cm is less lucent than previously consistent with a treated metastasis. Mixed attenuation sclerosis and lucency is redemonstrated in the left pubic bone with a new lytic lesion measuring 1.9 cm in the left superior pubic ramus on 2:72. Also, a sclerotic lesion in the right hemisacrum on 2:51 now measures 1.2 cm and was previously 7 mm, with similar interval enlargement of a left sacral sclerotic lesion measuring 2 cm on 2:54, previously 1.5 cm. There are probably other lesions in multiple locations which are too small to characterize. The bones in general appears subtly denser than previously. IMPRESSION: 1. High-grade small-bowel obstruction transitioning in the anterior mid to lower abdomen in the proximal ileum, with a sharply angulated transitional segment with surrounding  mesenteric edema. Etiology is most likely adhesive disease. Wall thickening of the transitional segment could be related to enteritis or ischemia but there is no pneumatosis. 2. Increased scattered ascites in the abdomen and pelvis, mild amount. 3. Diverting loop colostomy left lower abdomen with similar appearance of rectosigmoid junction thickening, adjacent stranding and adjacent presacral fluid and soft tissue thickening. 4. Left perirectal lymph nodes stable to slightly improved since 05/08/2022 but significantly smaller than on the earlier studies. 5. Shrinking hepatic metastases, but at the same time evidence of progression in  osseous metastatic disease with a new lesion in the left superior pubic ramus and enlarging lesions in the sacrum. 6. Cholelithiasis. 7. Chronic thickened folds in the jejunum. Electronically Signed   By: Telford Nab M.D.   On: 06/27/2022 02:26    Procedures Procedures    Medications Ordered in ED Medications  morphine (PF) 2 MG/ML injection 2 mg (has no administration in time range)  ondansetron (ZOFRAN) injection 4 mg (has no administration in time range)  sodium chloride 0.9 % bolus 1,000 mL (has no administration in time range)  morphine (PF) 4 MG/ML injection 4 mg (4 mg Intravenous Given 06/26/22 2359)  ondansetron (ZOFRAN) injection 4 mg (4 mg Intravenous Given 06/26/22 2359)  lactated ringers bolus 1,000 mL (0 mLs Intravenous Stopped 06/27/22 0207)  iohexol (OMNIPAQUE) 300 MG/ML solution 100 mL (100 mLs Intravenous Contrast Given 06/27/22 0115)  fentaNYL (SUBLIMAZE) injection 100 mcg (100 mcg Intravenous Given 06/27/22 0222)    ED Course/ Medical Decision Making/ A&P Clinical Course as of 06/27/22 0312  Sat Jun 27, 2022  0038 Patient feeling improved, resting comfortably [DW]  0203 WBC(!): 49.7 Significant leukocytosis noted [DW]  0308 CT scan reveals high-grade small bowel obstruction likely due to adhesions.  Patient also found to have significant leukocytosis, but she did just undergo a cycle of chemotherapy.  Thrombocytopenia is also noted. [DW]  0308 The case was discussed with Dr. Barry Dienes with general surgery.  She recommends NG tube placement, medical admission and general surgery to consult in the morning. [DW]  0309 I had a long discussion with patient and her husband.  They are frustrated with her worsening symptoms, and would like to speak to Dr. Johney Maine about her condition. [DW]  0309   They also would like to avoid NG tube placement at this time.  Plan will be to treat pain, nausea give IV fluids and allow patient be seen by general surgery later in the morning [DW]    Clinical  Course User Index [DW] Ripley Fraise, MD                           Medical Decision Making Amount and/or Complexity of Data Reviewed Labs:  Decision-making details documented in ED Course. ECG/medicine tests: ordered.  Risk Prescription drug management. Decision regarding hospitalization.   This patient presents to the ED for concern of abdominal pain, this involves an extensive number of treatment options, and is a complaint that carries with it a high risk of complications and morbidity.  The differential diagnosis includes but is not limited to cholecystitis, cholelithiasis, pancreatitis, gastritis, peptic ulcer disease, appendicitis, bowel obstruction, bowel perforation, diverticulitis, AAA, ischemic bowel    Comorbidities that complicate the patient evaluation: Patient's presentation is complicated by their history of rectal cancer   Additional history obtained: Additional history obtained from spouse Records reviewed  outpatient records  Lab Tests: I Ordered, and personally interpreted labs.  The  pertinent results include: Dehydration noted  Imaging Studies ordered: I ordered imaging studies including CT scan abdomen pelvis   I independently visualized and interpreted imaging which showed small bowel obstruction I agree with the radiologist interpretation  Cardiac Monitoring: The patient was maintained on a cardiac monitor.  I personally viewed and interpreted the cardiac monitor which showed an underlying rhythm of:  sinus rhythm  Medicines ordered and prescription drug management: I ordered medication including morphine for pain Reevaluation of the patient after these medicines showed that the patient    stayed the same   Critical Interventions:  Admission for small bowel obstruction  Consultations Obtained: I requested consultation with the admitting physician Dr. Myna Hidalgo with [facility] and consultant General surgery Dr. Barry Dienes , and discussed  findings as well as  pertinent plan - they recommend: Admit  Reevaluation: After the interventions noted above, I reevaluated the patient and found that they have :improved  Complexity of problems addressed: Patient's presentation is most consistent with  acute presentation with potential threat to life or bodily function  Disposition: After consideration of the diagnostic results and the patient's response to treatment,  I feel that the patent would benefit from admission   .           Final Clinical Impression(s) / ED Diagnoses Final diagnoses:  Small bowel obstruction [facility])    Rx / DC Orders ED Discharge Orders     None         Ripley Fraise, MD 06/27/22 [phone]

## 2022-06-27 NOTE — H&P (Signed)
History and Physical    Patient: Karen Kaufman HER:[id number] DOB: 1961-02-24 DOA: 06/26/2022 DOS: the patient was seen and examined on 06/27/2022 PCP: System, Provider Not In  Patient coming from: Home  Chief Complaint:  Chief Complaint  Patient presents with   Abdominal Pain   HPI: Karen Kaufman is a 61 y.o. 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  She has been constipated for the past few days.  Her appetite is decreased.  No diarrhea, melena or hematochezia.  No flank pain, dysuria, frequency or hematuria.  No fever, chills or night sweats. No sore throat, rhinorrhea, dyspnea, wheezing or hemoptysis.  No chest pain, palpitations, diaphoresis, PND, orthopnea or pitting edema of the lower extremities.  No polyuria, polydipsia, polyphagia or blurred vision.  ED course: Initial vital signs were temperature 97.5 F, pulse 99, respiration 20, BP 124/79 mmHg O2 sat 99% on room air.  The patient received 3000 mL of IVF bolus, several rounds of fentanyl, morphine and hydromorphone for pain along with ondansetron 4 mg IVP.   Lab work: Her urinalysis showed ketonuria 20 mg deciliter but was otherwise normal.  CBC with a white count of 49.7, hemoglobin 11.6 g/dL platelets 97.  Lipase was normal.  CMP showed normal electrolytes after calcium correction.  Glucose 123 mg/dL.  BUN 9 and creatinine 0.33 mg/dL.  Total protein 5.3 and albumin 3.1 g/dL.  AST 39, ALT 51 and alkaline phosphatase 277 units/L.  Magnesium was 2.5 mg deciliter.  Phosphorus was normal.  Imaging: CT abdomen/pelvis with contrast showed a high-grade small bowel obstruction with a sharply angulated transitional segment with surrounding mesenteric  edema.  There is wall thickening of the transitional segment that could be related to enteritis or ischemia but there is no pneumatosis.  With a mild amount increased scattered ascites in the abdomen and pelvis.  Diverting loop colostomy in left lower abdomen with similar appearance of rectosigmoid junction thickening, adjacent stranding and adjacent presacral fluid and soft tissue thickening.  Cholelithiasis.  Chronic thickened folds of the jejunum.  Shrinking hepatic metastasis.  However, there is progression in osseous metastatic disease with new lesion in the left superior pubic ramus and enlarging lesions in the sacrum.  Please see images and full radiology report for further details.   Review of Systems: As mentioned in the history of present illness. All other systems reviewed and are negative. 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 [facility])   Musculoskeletal and Integument Closed compression fracture of body of L1 vertebra (HCC)   Other Hypercholesterolemia Hematochezia Abnormal CT scan, colon LLQ abdominal pain Insomnia Menopausal symptom Hypokalemia Hyponatremia Colostomy in place [facility]) Pain from bone metastases ([place])   Past surgical history: 03/12/2022 Ileo loop colostomy closure (N/A)  02/20/2022 Ir imaging guided port insertion 02/20/2022 Ir US guide bx asp/drain 02/09/2022 Flexible sigmoidoscopy (N/A)  02/09/2022 Bowel decompression (N/A)  02/09/2022 Colonoscopy with propofol (N/A)  02/09/2022 Polypectomy  2003 Breast surgery  2002 Abdominal hysterectomy 1995 Brain surgery  Date Unknown Eye surgery    Social History:  reports that she  has never smoked. She has never used smokeless tobacco. She reports that she does not  drink alcohol and does not use drugs.  Allergies  Allergen Reactions   Shellfish Allergy Anaphylaxis   Pertinent family history. Mother with history of breast cancer, diabetes, heart failure, hyperlipidemia and migraines.  Father with history of prostate cancer.  Maternal grandfather with Hodgkin's lymphoma.  Multiple family members with arthritis.  Prior to Admission medications   Not on File   atorvastatin (LIPITOR) 20 MG tablet  famotidine (PEPCID) 20 MG tablet HYDROcodone-acetaminophen (NORCO) 5-325 MG tablet lidocaine-prilocaine (EMLA) cream magnesium oxide (MAG-OX) 400 MG tablet ondansetron (ZOFRAN) 8 MG tablet  pantoprazole (PROTONIX) 20 MG tablet polyethylene glycol (MIRALAX / GLYCOLAX) 17 g packet  prochlorperazine (COMPAZINE) 10 MG tablet  Physical Exam: Vitals:   06/27/22 0145 06/27/22 0323 06/27/22 0645 06/27/22 0732  BP: 106/60  116/70 111/66  Pulse: 83  79 86  Resp: 20  20 (!) 22  Temp:  97.7 F (36.5 C)  (!) 97.5 F (36.4 C)  TempSrc:      SpO2: 100%  97% 97%  Weight:      Height:       Physical Exam Vitals and nursing note reviewed.  Constitutional:      General: She is awake. She is not in acute distress.    Appearance: She is well-developed and normal weight.  HENT:     Head: Normocephalic.     Mouth/Throat:     Mouth: Mucous membranes are dry.  Eyes:     General: No scleral icterus.    Pupils: Pupils are equal, round, and reactive to light.  Neck:     Vascular: No JVD.  Cardiovascular:     Rate and Rhythm: Normal rate and regular rhythm.     Heart sounds: S1 normal and S2 normal.  Pulmonary:     Effort: Pulmonary effort is normal.     Breath sounds: Normal breath sounds.  Abdominal:     General: The ostomy site is clean. Bowel sounds are normal.     Palpations: Abdomen is soft.     Tenderness: There is generalized abdominal tenderness. There is no right CVA tenderness, left CVA tenderness, guarding or rebound.  Musculoskeletal:      Cervical back: Neck supple.     Right lower leg: No edema.     Left lower leg: No edema.  Skin:    General: Skin is warm and dry.  Neurological:     General: No focal deficit present.     Mental Status: She is alert and oriented to person, place, and time.  Psychiatric:        Mood and Affect: Mood normal.        Behavior: Behavior normal. Behavior is cooperative.   Data Reviewed:  There are no new results to review at this time.  Assessment and Plan: Principal Problem:   SBO (small bowel obstruction) (HCC) Inpatient/MedSurg. Keep NPO. Continue IV fluids. Analgesics as needed. Antiemetics as needed. Pantoprazole 40 mg IVP every 24 hours. Keep electrolytes optimized. Follow-up CBC and CMP in AM. Follow-up imaging in the morning. General surgery input appreciated.  Active Problems:   Leukocytosis Received CSF with chemotherapy. Continue to monitor WBC.    Normocytic anemia Her hematocrit and hemoglobin.    Thrombocytopenia (HCC) Monitor platelet count.    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    Mild protein  malnutrition ([facility]) Will need protein supplementation. Consider nutritional services evaluation.    Advance Care Planning:   Code Status: Full Code   Consults: General surgery.   Family Communication:   Severity of Illness: The appropriate patient status for this patient is INPATIENT. Inpatient status is judged to be reasonable and necessary in order to provide the required intensity of service to ensure the patient's safety. The patient's presenting symptoms, physical exam findings, and initial radiographic and laboratory data in the context of their chronic comorbidities is felt to place them at high risk for further clinical deterioration. Furthermore, it is not anticipated that the patient will be medically stable for discharge from the hospital within  2 midnights of admission.   * I certify that at the point of admission it is my clinical judgment that the patient will require inpatient hospital care spanning beyond 2 midnights from the point of admission due to high intensity of service, high risk for further deterioration and high frequency of surveillance required.*  Author: Reubin Milan, MD 06/27/2022 8:24 AM  For on call review [email or web address].   This document was prepared using Dragon voice recognition software and may contain some unintended transcription errors.

## 2022-06-28 DIAGNOSIS — K56609 Unspecified intestinal obstruction, unspecified as to partial versus complete obstruction: Secondary | ICD-10-CM | POA: Diagnosis not present

## 2022-06-28 LAB — COMPREHENSIVE METABOLIC PANEL
ALT: 31 U/L (ref 0–44)
AST: 24 U/L (ref 15–41)
Albumin: 2.2 g/dL — ABNORMAL LOW (ref 3.5–5.0)
Alkaline Phosphatase: 214 U/L — ABNORMAL HIGH (ref 38–126)
Anion gap: 3 — ABNORMAL LOW (ref 5–15)
BUN: 7 mg/dL — ABNORMAL LOW (ref 8–23)
CO2: 24 mmol/L (ref 22–32)
Calcium: 7.4 mg/dL — ABNORMAL LOW (ref 8.9–10.3)
Chloride: 109 mmol/L (ref 98–111)
Creatinine, Ser: 0.4 mg/dL — ABNORMAL LOW (ref 0.44–1.00)
GFR, Estimated: >60 mL/min (ref >60)
Glucose, Bld: 82 mg/dL (ref 70–99)
Potassium: 4 mmol/L (ref 3.5–5.1)
Sodium: 136 mmol/L (ref 135–145)
Total Bilirubin: 0.8 mg/dL (ref 0.3–1.2)
Total Protein: 4.1 g/dL — ABNORMAL LOW (ref 6.5–8.1)

## 2022-06-28 LAB — CBC
HCT: 27.3 % — ABNORMAL LOW (ref 36.0–46.0)
Hemoglobin: 8.6 g/dL — ABNORMAL LOW (ref 12.0–15.0)
MCH: 31.5 pg (ref 26.0–34.0)
MCHC: 31.5 g/dL (ref 30.0–36.0)
MCV: 100 fL (ref 80.0–100.0)
Platelets: 48 10*3/uL — ABNORMAL LOW (ref 150–400)
RBC: 2.73 MIL/uL — ABNORMAL LOW (ref 3.87–5.11)
RDW: 17.2 % — ABNORMAL HIGH (ref 11.5–15.5)
WBC: 20.4 10*3/uL — ABNORMAL HIGH (ref 4.0–10.5)
nRBC: 0 % (ref 0.0–0.2)

## 2022-06-28 MED ORDER — SODIUM CHLORIDE 0.9% FLUSH: 10.0000 mL | INTRAVENOUS | Status: DC | PRN

## 2022-06-28 MED ORDER — LACTATED RINGERS IV SOLN: INTRAVENOUS | Status: DC

## 2022-06-28 NOTE — Progress Notes (Signed)
PROGRESS NOTE    Karen Kaufman  ERD:[id number] DOB: Aug 19, 1961 DOA: 06/26/2022 PCP: System, Provider Not In     Brief Narrative:  Karen Kaufman is a 61 y.o. 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  She has been constipated for the past few days.  Her appetite is decreased.  CT abdomen/pelvis with contrast showed a high-grade small bowel obstruction with a sharply angulated transitional segment with surrounding mesenteric edema.  There is wall thickening of the transitional segment that could be related to enteritis or ischemia but there is no pneumatosis.  With a mild amount increased scattered ascites in the abdomen and pelvis.  Diverting loop colostomy in left lower abdomen with similar appearance of rectosigmoid junction thickening, adjacent stranding and adjacent presacral fluid and soft tissue thickening.  Cholelithiasis.  Chronic thickened folds of the jejunum.  Shrinking hepatic metastasis.  However, there is progression in osseous metastatic disease with new lesion in the left superior pubic ramus and enlarging lesions in the sacrum.  Please see images and full radiology report for further details.  Patient was admitted for small bowel obstruction and general surgery consulted.  New events last 24 hours / Subjective: Patient feeling well.  Denies abdominal pain this morning.  Having stool output in her colostomy bag.  No nausea.  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 Benay Spice   Anemia and thrombocytopenia in setting of chemotherapy -Monitor  DVT prophylaxis:  SCDs Start: 06/27/22 0708  Code Status: Full code Family Communication: No family at bedside Disposition Plan:  Status is: Inpatient Remains inpatient appropriate because: SBO, IV fluid, IV Zosyn   Antimicrobials:  Anti-infectives (From admission, onward)    Start     Dose/Rate Route Frequency Ordered Stop   06/27/22 1200  piperacillin-tazobactam (ZOSYN) IVPB 3.375 g        3.375 g 12.5 mL/hr over 240 Minutes Intravenous Every 8 hours 06/27/22 1115          Objective: Vitals:   06/27/22 2140 06/27/22 2305 06/28/22 0117 06/28/22 0527  BP: (!) 94/59 108/61 (!) 99/58 (!) 94/57  Pulse: 73 83 84 83  Resp: '15  14 16  '$ Temp: 98.2 F (36.8 C)  98 F (36.7 C) 98.3 F (36.8 C)  TempSrc: Oral  Oral Oral  SpO2: 98%  97% 97%  Weight:      Height:        Intake/Output Summary (Last 24 hours) at 06/28/2022 1000 Last data filed at 06/28/2022 0600 Gross per 24 hour  Intake 2036.29 ml  Output 990 ml  Net 1046.29 ml   Filed Weights   06/26/22 2158  Weight: 54.4 kg    Examination:  General exam: Appears calm and comfortable  Respiratory system: Clear to auscultation. Respiratory effort normal. No respiratory distress. No conversational dyspnea.  Cardiovascular system: S1 &  S2 heard, RRR. No murmurs. No pedal edema. Gastrointestinal system: Abdomen is nondistended, soft and mildly sore to palpation, colostomy in place with liquid stool Central nervous system: Alert and oriented. No focal neurological deficits. Speech clear.  Extremities: Symmetric in appearance  Skin: No rashes, lesions or ulcers on exposed skin  Psychiatry: Judgement and insight appear  normal. Mood & affect appropriate.   Data Reviewed: I have personally reviewed following labs and imaging studies  CBC: Recent Labs  Lab 06/26/22 2356 06/28/22 0531  WBC 49.7* 20.4*  HGB 11.6* 8.6*  HCT 35.4* 27.3*  MCV 96.2 100.0  PLT 97* 48*   Basic Metabolic Panel: Recent Labs  Lab 06/26/22 2356 06/28/22 0531  NA 139 136  K 3.8 4.0  CL 105 109  CO2 27 24  GLUCOSE 123* 82  BUN 9 7*  CREATININE 0.33* 0.40*  CALCIUM 8.2* 7.4*  MG 2.5*  --   PHOS 3.7  --    GFR: Estimated Creatinine Clearance: 62.6 mL/min (A) (by C-G formula based on SCr of 0.4 mg/dL (L)). Liver Function Tests: Recent Labs  Lab 06/26/22 2356 06/28/22 0531  AST 39 24  ALT 51* 31  ALKPHOS 277* 214*  BILITOT 0.7 0.8  PROT 5.3* 4.1*  ALBUMIN 3.1* 2.2*   Recent Labs  Lab 06/26/22 2356  LIPASE 24   No results for input(s): "AMMONIA" in the last 168 hours. Coagulation Profile: No results for input(s): "INR", "PROTIME" in the last 168 hours. Cardiac Enzymes: No results for input(s): "CKTOTAL", "CKMB", "CKMBINDEX", "TROPONINI" in the last 168 hours. BNP (last 3 results) No results for input(s): "PROBNP" in the last 8760 hours. HbA1C: No results for input(s): "HGBA1C" in the last 72 hours. CBG: No results for input(s): "GLUCAP" in the last 168 hours. Lipid Profile: No results for input(s): "CHOL", "HDL", "LDLCALC", "TRIG", "CHOLHDL", "LDLDIRECT" in the last 72 hours. Thyroid Function Tests: No results for input(s): "TSH", "T4TOTAL", "FREET4", "T3FREE", "THYROIDAB" in the last 72 hours. Anemia Panel: No results for input(s): "VITAMINB12", "FOLATE", "FERRITIN", "TIBC", "IRON", "RETICCTPCT" in the last 72 hours. Sepsis Labs: No results for input(s): "PROCALCITON", "LATICACIDVEN" in the last 168 hours.  No results found for this or any previous visit (from the past 240 hour(s)).    Radiology Studies: DG Abd Portable 1V-Small Bowel Obstruction Protocol-initial, 8 hr delay  Result Date:  06/27/2022 CLINICAL DATA:  SBO protocol, 8 hour delay image. History of colorectal cancer with colostomy. EXAM: PORTABLE ABDOMEN - 1 VIEW COMPARISON:  06/27/2022 at 0915 hours FINDINGS: Enteric tube terminates in the gastric cardia. Contrast in the stomach. Contrast opacifies the ascending, transverse, and descending colon to the left mid abdomen. This likely corresponds to the site of the patient's left mid abdominal colostomy. Excretory contrast in the bladder. IMPRESSION: Contrast throughout the colon, likely to the level of the patient's left mid abdominal colostomy. Electronically Signed   By: Julian Hy M.D.   On: 06/27/2022 21:00   DG Chest 1 View  Result Date: 06/27/2022 CLINICAL DATA:  NG tube placement. EXAM: CHEST  1 VIEW COMPARISON:  03/12/2022 and abdominal film 06/27/2022 FINDINGS: Enteric tube present with tip and side-port over the stomach in the left upper quadrant just below the hemidiaphragm. Contrast is present within the NG tube and stomach. Right IJ Port-A-Cath unchanged. Lungs are adequately inflated without consolidation or effusion. Cardiomediastinal silhouette and remainder of the exam is unchanged. IMPRESSION: 1. No acute cardiopulmonary disease. 2. Enteric tube with tip and side-port over the stomach in the  left upper quadrant. Electronically Signed   By: Marin Olp M.D.   On: 06/27/2022 12:08   DG Abd Portable 1V-Small Bowel Protocol-Position Verification  Result Date: 06/27/2022 CLINICAL DATA:  NG tube placement. EXAM: PORTABLE ABDOMEN - 1 VIEW COMPARISON:  03/18/2022 FINDINGS: NG tube is present with tip and side-port over the stomach in the left upper quadrant. Bowel gas pattern is nonobstructive. No free peritoneal air. Lung bases are clear. Remainder of the exam is unchanged. IMPRESSION: 1. Nonobstructive bowel gas pattern. 2. NG tube with tip and side-port over the stomach in the left upper quadrant. Electronically Signed   By: Marin Olp M.D.   On: 06/27/2022  09:27   CT ABDOMEN PELVIS W CONTRAST  Result Date: 06/27/2022 CLINICAL DATA:  Acute abdominal pain with history of colorectal cancer and colostomy. EXAM: CT ABDOMEN AND PELVIS WITH CONTRAST TECHNIQUE: Multidetector CT imaging of the abdomen and pelvis was performed using the standard protocol following bolus administration of intravenous contrast. RADIATION DOSE REDUCTION: This exam was performed according to the departmental dose-optimization program which includes automated exposure control, adjustment of the mA and/or kV according to patient size and/or use of iterative reconstruction technique. CONTRAST:  184m OMNIPAQUE IOHEXOL 300 MG/ML  SOLN COMPARISON:  CT with oral contrast only 05/08/2022, CTs without contrast 03/11/2022 and 02/08/2022. FINDINGS: Lower chest: Lung bases are clear apart from minimal posterior atelectasis. Epicardial lymph nodes to the right are again noted up to 8.6 mm in short axis but have not increased in size. There are bilateral breast implants partially visible. Hepatobiliary: In the lateral liver dome on 2:12 there is a hypodense lesion compatible with metastasis measuring 1.6 cm in segment 7. This was larger previously, particularly on 03/11/2022 when it measured 2.4 cm. On axial images 22 and 23 there are additional hypodense lesions inferiorly in segment 6, both approaching 1 cm which were also previously about double the size. There are few scattered hypodensities which are too small to characterize and a small cyst in the left lobe anteriorly. There are small stones in the gallbladder but no wall thickening or biliary dilatation. Pancreas: Unremarkable. Spleen: Unremarkable. Adrenals/Urinary Tract: There is no adrenal mass. In the lower pole right kidney there is a 1.6 cm cyst of 23 Hounsfield units. No follow-up imaging is required. Both kidneys otherwise enhance normally. There is no urinary stone or obstruction. No bladder thickening. Stomach/Bowel: Output of the distal  descending colon through a left mid abdominal diverting loop colostomy. Thickened folds are chronically noted in the jejunum. Beginning at the ligament of Treitz there is small bowel dilatation in the left upper to mid and lower abdomen with small bowel segments up to 4.1 cm. The transitional segment is a sharply angulated loop anteriorly in the mid to lower abdomen best seen on axial images 42-45 of series 2, with thickened folds at the transition. The rest of the small bowel is collapsed in the lower abdomen and upper pelvis. The appendix is normal. Large bowel unremarkable except at the rectosigmoid junction where circumferential wall thickening continues to be seen with perirectal stranding. Presacral stranding and mild layering presacral fluid also continue to be noted. There is increased scattered ascites along the mesenteric folds in the mid to lower abdomen as well. There is no bowel pneumatosis or portal venous gas. Vascular/Lymphatic: No significant vascular findings. Left perirectal lymph nodes are again noted up to 8.2 mm in short axis, slightly improved. No adenopathy is seen elsewhere in the pelvis or in the abdomen.  Reproductive: Status post hysterectomy. No adnexal masses. Other: As above there is scattered ascites in the abdomen and pelvis. There is no free hemorrhage, abscess or free air. Musculoskeletal: Bony sclerosis and mild-to-moderate pathologic compression fractures are again noted of L1 and 3. Lytic metastasis in the posterior L5 body measuring 1.8 cm is less lucent than previously consistent with a treated metastasis. Mixed attenuation sclerosis and lucency is redemonstrated in the left pubic bone with a new lytic lesion measuring 1.9 cm in the left superior pubic ramus on 2:72. Also, a sclerotic lesion in the right hemisacrum on 2:51 now measures 1.2 cm and was previously 7 mm, with similar interval enlargement of a left sacral sclerotic lesion measuring 2 cm on 2:54, previously 1.5 cm.  There are probably other lesions in multiple locations which are too small to characterize. The bones in general appears subtly denser than previously. IMPRESSION: 1. High-grade small-bowel obstruction transitioning in the anterior mid to lower abdomen in the proximal ileum, with a sharply angulated transitional segment with surrounding mesenteric edema. Etiology is most likely adhesive disease. Wall thickening of the transitional segment could be related to enteritis or ischemia but there is no pneumatosis. 2. Increased scattered ascites in the abdomen and pelvis, mild amount. 3. Diverting loop colostomy left lower abdomen with similar appearance of rectosigmoid junction thickening, adjacent stranding and adjacent presacral fluid and soft tissue thickening. 4. Left perirectal lymph nodes stable to slightly improved since 05/08/2022 but significantly smaller than on the earlier studies. 5. Shrinking hepatic metastases, but at the same time evidence of progression in osseous metastatic disease with a new lesion in the left superior pubic ramus and enlarging lesions in the sacrum. 6. Cholelithiasis. 7. Chronic thickened folds in the jejunum. Electronically Signed   By: Telford Nab M.D.   On: 06/27/2022 02:26      Scheduled Meds:  Chlorhexidine Gluconate Cloth  6 each Topical Daily   lip balm   Topical BID   Continuous Infusions:  sodium chloride 10 mL/hr at 06/27/22 1321   0.9 % NaCl with KCl 20 mEq / L 75 mL/hr at 06/27/22 0737   lactated ringers     methocarbamol (ROBAXIN) IV 1,000 mg (06/28/22 0136)   ondansetron (ZOFRAN) IV     piperacillin-tazobactam 3.375 g (06/28/22 0355)     LOS: 1 day     Dessa Phi, DO [facility] 06/28/2022, 10:00 AM   Available via Epic secure chat 7am-7pm After these hours, please refer to coverage provider listed on [email or web address]

## 2022-06-28 NOTE — Progress Notes (Signed)
Karen Kaufman [id number] 1961/03/14  CARE TEAM:  PCP: System, Provider Not In  Outpatient Care Team: Patient Care Team: System, Provider Not In as PCP - General Ladell Pier, MD as Consulting Physician (Oncology)  Inpatient Treatment Team: Treatment Team: Attending Provider: Dessa Phi, DO; Rounding Team: Nolon Nations, MD; Consulting Physician: Ladell Pier, MD; Rounding Team: Fatima Blank, MD; Technician: Karna Christmas, NT; Registered Nurse: Dorinda Hill, RN; Utilization Review: Antionette Char, RN   Problem List:   Principal Problem:   SBO (small bowel obstruction) [facility]) Active Problems:   Leukocytosis   Mild protein malnutrition (HCC)   Normocytic anemia   Thrombocytopenia (HCC)   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  [facility] Stay = 1 days)  Plan:  -Contrast in colon.  Normal ampulla nasogastric tube and nursing concerned given thickness of return.  We will do 6-hour clamping trial.  If tolerates clears with residual less than 250, pull nasogastric tube and advance to dysphagia 1/full liquid diet.  Patient failed clamping trial, return to suction and regroup.  Try and hold off on surgery given the fact that she is in the middle of chemotherapy and last dose less than a week ago  Colostomy care.  Management of cancer with chemotherapy per medical oncology.  If tolerates clamping trial and advances diet, most likely can stop antibiotics, arguing against any ileitis or other concerning intra-abdominal finding.  -VTE prophylaxis- SCDs, etc -mobilize as tolerated to help recovery  Disposition:  Disposition:  The patient is from: Home  Anticipate discharge to:  Home  Anticipated Date of Discharge is:  September 11,2023    Barriers to  discharge:  Pending Clinical improvement (more likely than not)  Patient currently is NOT MEDICALLY STABLE for discharge from the hospital from a surgery standpoint.      I reviewed nursing notes, hospitalist notes, last 24 h vitals and pain scores, last 48 h intake and output, last 24 h labs and trends, and last 24 h imaging results. I have reviewed this patient's available data, including medical history, events of note, test results, etc as part of my evaluation.  A significant portion of that time was spent in counseling.  Care during the described time interval was provided by me.  This care required moderate level of medical decision making.  06/28/2022    Subjective: (Chief complaint)  Patient with less abdominal pain.  Nasogastric output with lower output but still somewhat thick.  Starting to move bowels.  Objective:  Vital signs:  Vitals:   06/27/22 2140 06/27/22 2305 06/28/22 0117 06/28/22 0527  BP: (!) 94/59 108/61 (!) 99/58 (!) 94/57  Pulse: 73 83 84 83  Resp: '15  14 16  '$ Temp: 98.2 F (36.8 C)  98 F (36.7 C) 98.3 F (36.8 C)  TempSrc: Oral  Oral Oral  SpO2: 98%  97% 97%  Weight:      Height:        Last BM Date : 06/27/22  Intake/Output   Yesterday:  09/09 0701 - 09/10 0700 In: 2209.5 [P.O.:150; I.V.:1674.1; NG/GT:150; IV Piggyback:235.4] Out: 990 [Urine:200; Emesis/NG output:400; Stool:390] This shift:  No intake/output data recorded.  Bowel function:  Flatus: YES  BM:  YES  Drain: Nasogastric tube in place with  thick bilious output with occasional flecks/chunks.   Physical Exam:  General: Pt awake/alert in no acute distress Eyes: PERRL, normal EOM.  Sclera clear.  No icterus Neuro: CN II-XII intact w/o focal sensory/motor deficits. Lymph: No head/neck/groin lymphadenopathy Psych:  No delerium/psychosis/paranoia.  Oriented x 4 HENT: Normocephalic, Mucus membranes moist.  No thrush Neck: Supple, No tracheal deviation.  No obvious  thyromegaly Chest: No pain to chest wall compression.  Good respiratory excursion.  No audible wheezing CV:  Pulses intact.  Regular rhythm.  No major extremity edema MS: Normal AROM mjr joints.  No obvious deformity  Abdomen: Soft.  Mildy distended.  Nontender.  No evidence of peritonitis.  Ostomy pink with gas and stool in bag.  No incarcerated hernias.  Ext:   No deformity.  No mjr edema.  No cyanosis Skin: No petechiae / purpurea.  No major sores.  Warm and dry    Results:   Cultures: No results found for this or any previous visit (from the past 720 hour(s)).  Labs: Results for orders placed or performed during the hospital encounter of 06/26/22 (from the past 48 hour(s))  Urinalysis, Routine w reflex microscopic Urine, Clean Catch     Status: Abnormal   Collection Time: 06/26/22 10:58 PM  Result Value Ref Range   Color, Urine YELLOW YELLOW   APPearance CLEAR CLEAR   Specific Gravity, Urine 1.017 1.005 - 1.030   pH 6.0 5.0 - 8.0   Glucose, UA NEGATIVE NEGATIVE mg/dL   Hgb urine dipstick NEGATIVE NEGATIVE   Bilirubin Urine NEGATIVE NEGATIVE   Ketones, ur 20 (A) NEGATIVE mg/dL   Protein, ur NEGATIVE NEGATIVE mg/dL   Nitrite NEGATIVE NEGATIVE   Leukocytes,Ua NEGATIVE NEGATIVE    Comment: Performed at [facility], [place] [address], [place], [place] [address]  Lipase, blood     Status: None   Collection Time: 06/26/22 11:56 PM  Result Value Ref Range   Lipase 24 11 - 51 U/L    Comment: Performed at [facility], [place] [address], [place], [place] [address]  Comprehensive metabolic panel     Status: Abnormal   Collection Time: 06/26/22 11:56 PM  Result Value Ref Range   Sodium 139 135 - 145 mmol/L   Potassium 3.8 3.5 - 5.1 mmol/L   Chloride 105 98 - 111 mmol/L   CO2 27 22 - 32 mmol/L   Glucose, Bld 123 (H) 70 - 99 mg/dL    Comment: Glucose reference range applies only to samples taken after fasting for at least 8 hours.   BUN 9 8 - 23  mg/dL   Creatinine, Ser 0.33 (L) 0.44 - 1.00 mg/dL   Calcium 8.2 (L) 8.9 - 10.3 mg/dL   Total Protein 5.3 (L) 6.5 - 8.1 g/dL   Albumin 3.1 (L) 3.5 - 5.0 g/dL   AST 39 15 - 41 U/L   ALT 51 (H) 0 - 44 U/L   Alkaline Phosphatase 277 (H) 38 - 126 U/L   Total Bilirubin 0.7 0.3 - 1.2 mg/dL   GFR, Estimated >60 >60 mL/min    Comment: (NOTE) Calculated using the CKD-EPI Creatinine Equation (2021)    Anion gap 7 5 - 15    Comment: Performed at [facility], [place] [address], [place], [place] [address]  CBC     Status: Abnormal   Collection Time: 06/26/22 11:56 PM  Result Value Ref Range   WBC 49.7 (H) 4.0 - 10.5 K/uL   RBC 3.68 (  L) 3.87 - 5.11 MIL/uL   Hemoglobin 11.6 (L) 12.0 - 15.0 g/dL   HCT 35.4 (L) 36.0 - 46.0 %   MCV 96.2 80.0 - 100.0 fL   MCH 31.5 26.0 - 34.0 pg   MCHC 32.8 30.0 - 36.0 g/dL   RDW 17.1 (H) 11.5 - 15.5 %   Platelets 97 (L) 150 - 400 K/uL    Comment: Immature Platelet Fraction may be clinically indicated, consider ordering this additional test [id number]    nRBC 0.0 0.0 - 0.2 %    Comment: Performed at [facility], [place] [address], [place], [place] [address]  Magnesium     Status: Abnormal   Collection Time: 06/26/22 11:56 PM  Result Value Ref Range   Magnesium 2.5 (H) 1.7 - 2.4 mg/dL    Comment: Performed at [facility], [place] [address], [place], [place] [address]  Phosphorus     Status: None   Collection Time: 06/26/22 11:56 PM  Result Value Ref Range   Phosphorus 3.7 2.5 - 4.6 mg/dL    Comment: Performed at [facility], [place] [address] [place], [place] [address]  CBC     Status: Abnormal   Collection Time: 06/28/22  5:31 AM  Result Value Ref Range   WBC 20.4 (H) 4.0 - 10.5 K/uL   RBC 2.73 (L) 3.87 - 5.11 MIL/uL   Hemoglobin 8.6 (L) 12.0 - 15.0 g/dL    Comment: REPEATED TO VERIFY   HCT 27.3 (L) 36.0 - 46.0 %   MCV 100.0 80.0 - 100.0 fL   MCH 31.5 26.0 - 34.0 pg   MCHC 31.5 30.0  - 36.0 g/dL   RDW 17.2 (H) 11.5 - 15.5 %   Platelets 48 (L) 150 - 400 K/uL    Comment: Immature Platelet Fraction may be clinically indicated, consider ordering this additional test [id number] REPEATED TO VERIFY DELTA CHECK NOTED    nRBC 0.0 0.0 - 0.2 %    Comment: Performed at [facility], [place] [address], [place], [place] [address]  Comprehensive metabolic panel     Status: Abnormal   Collection Time: 06/28/22  5:31 AM  Result Value Ref Range   Sodium 136 135 - 145 mmol/L    Comment: Electrolytes repeated to confirm.   Potassium 4.0 3.5 - 5.1 mmol/L    Comment: Electrolytes repeated to confirm.   Chloride 109 98 - 111 mmol/L    Comment: Electrolytes repeated to confirm.   CO2 24 22 - 32 mmol/L    Comment: Electrolytes repeated to confirm.   Glucose, Bld 82 70 - 99 mg/dL    Comment: Glucose reference range applies only to samples taken after fasting for at least 8 hours.   BUN 7 (L) 8 - 23 mg/dL   Creatinine, Ser 0.40 (L) 0.44 - 1.00 mg/dL   Calcium 7.4 (L) 8.9 - 10.3 mg/dL   Total Protein 4.1 (L) 6.5 - 8.1 g/dL   Albumin 2.2 (L) 3.5 - 5.0 g/dL   AST 24 15 - 41 U/L   ALT 31 0 - 44 U/L   Alkaline Phosphatase 214 (H) 38 - 126 U/L   Total Bilirubin 0.8 0.3 - 1.2 mg/dL   GFR, Estimated >60 >60 mL/min    Comment: (NOTE) Calculated using the CKD-EPI Creatinine Equation (2021)    Anion gap 3 (L) 5 - 15    Comment: Performed at [facility], [place] [address], [place],  [address]  Imaging / Studies: DG Abd Portable 1V-Small Bowel Obstruction Protocol-initial, 8 hr delay  Result Date: 06/27/2022 CLINICAL DATA:  SBO protocol, 8 hour delay image. History of colorectal cancer with colostomy. EXAM: PORTABLE ABDOMEN - 1 VIEW COMPARISON:  06/27/2022 at 0915 hours FINDINGS: Enteric tube terminates in the gastric cardia. Contrast in the stomach. Contrast opacifies the ascending, transverse, and descending colon to the left mid abdomen. This likely  corresponds to the site of the patient's left mid abdominal colostomy. Excretory contrast in the bladder. IMPRESSION: Contrast throughout the colon, likely to the level of the patient's left mid abdominal colostomy. Electronically Signed   By: Julian Hy M.D.   On: 06/27/2022 21:00   DG Chest 1 View  Result Date: 06/27/2022 CLINICAL DATA:  NG tube placement. EXAM: CHEST  1 VIEW COMPARISON:  03/12/2022 and abdominal film 06/27/2022 FINDINGS: Enteric tube present with tip and side-port over the stomach in the left upper quadrant just below the hemidiaphragm. Contrast is present within the NG tube and stomach. Right IJ Port-A-Cath unchanged. Lungs are adequately inflated without consolidation or effusion. Cardiomediastinal silhouette and remainder of the exam is unchanged. IMPRESSION: 1. No acute cardiopulmonary disease. 2. Enteric tube with tip and side-port over the stomach in the left upper quadrant. Electronically Signed   By: Marin Olp M.D.   On: 06/27/2022 12:08   DG Abd Portable 1V-Small Bowel Protocol-Position Verification  Result Date: 06/27/2022 CLINICAL DATA:  NG tube placement. EXAM: PORTABLE ABDOMEN - 1 VIEW COMPARISON:  03/18/2022 FINDINGS: NG tube is present with tip and side-port over the stomach in the left upper quadrant. Bowel gas pattern is nonobstructive. No free peritoneal air. Lung bases are clear. Remainder of the exam is unchanged. IMPRESSION: 1. Nonobstructive bowel gas pattern. 2. NG tube with tip and side-port over the stomach in the left upper quadrant. Electronically Signed   By: Marin Olp M.D.   On: 06/27/2022 09:27   CT ABDOMEN PELVIS W CONTRAST  Result Date: 06/27/2022 CLINICAL DATA:  Acute abdominal pain with history of colorectal cancer and colostomy. EXAM: CT ABDOMEN AND PELVIS WITH CONTRAST TECHNIQUE: Multidetector CT imaging of the abdomen and pelvis was performed using the standard protocol following bolus administration of intravenous contrast. RADIATION  DOSE REDUCTION: This exam was performed according to the departmental dose-optimization program which includes automated exposure control, adjustment of the mA and/or kV according to patient size and/or use of iterative reconstruction technique. CONTRAST:  138m OMNIPAQUE IOHEXOL 300 MG/ML  SOLN COMPARISON:  CT with oral contrast only 05/08/2022, CTs without contrast 03/11/2022 and 02/08/2022. FINDINGS: Lower chest: Lung bases are clear apart from minimal posterior atelectasis. Epicardial lymph nodes to the right are again noted up to 8.6 mm in short axis but have not increased in size. There are bilateral breast implants partially visible. Hepatobiliary: In the lateral liver dome on 2:12 there is a hypodense lesion compatible with metastasis measuring 1.6 cm in segment 7. This was larger previously, particularly on 03/11/2022 when it measured 2.4 cm. On axial images 22 and 23 there are additional hypodense lesions inferiorly in segment 6, both approaching 1 cm which were also previously about double the size. There are few scattered hypodensities which are too small to characterize and a small cyst in the left lobe anteriorly. There are small stones in the gallbladder but no wall thickening or biliary dilatation. Pancreas: Unremarkable. Spleen: Unremarkable. Adrenals/Urinary Tract: There is no adrenal mass. In the lower pole right kidney there is a 1.6 cm cyst of  23 Hounsfield units. No follow-up imaging is required. Both kidneys otherwise enhance normally. There is no urinary stone or obstruction. No bladder thickening. Stomach/Bowel: Output of the distal descending colon through a left mid abdominal diverting loop colostomy. Thickened folds are chronically noted in the jejunum. Beginning at the ligament of Treitz there is small bowel dilatation in the left upper to mid and lower abdomen with small bowel segments up to 4.1 cm. The transitional segment is a sharply angulated loop anteriorly in the mid to lower  abdomen best seen on axial images 42-45 of series 2, with thickened folds at the transition. The rest of the small bowel is collapsed in the lower abdomen and upper pelvis. The appendix is normal. Large bowel unremarkable except at the rectosigmoid junction where circumferential wall thickening continues to be seen with perirectal stranding. Presacral stranding and mild layering presacral fluid also continue to be noted. There is increased scattered ascites along the mesenteric folds in the mid to lower abdomen as well. There is no bowel pneumatosis or portal venous gas. Vascular/Lymphatic: No significant vascular findings. Left perirectal lymph nodes are again noted up to 8.2 mm in short axis, slightly improved. No adenopathy is seen elsewhere in the pelvis or in the abdomen. Reproductive: Status post hysterectomy. No adnexal masses. Other: As above there is scattered ascites in the abdomen and pelvis. There is no free hemorrhage, abscess or free air. Musculoskeletal: Bony sclerosis and mild-to-moderate pathologic compression fractures are again noted of L1 and 3. Lytic metastasis in the posterior L5 body measuring 1.8 cm is less lucent than previously consistent with a treated metastasis. Mixed attenuation sclerosis and lucency is redemonstrated in the left pubic bone with a new lytic lesion measuring 1.9 cm in the left superior pubic ramus on 2:72. Also, a sclerotic lesion in the right hemisacrum on 2:51 now measures 1.2 cm and was previously 7 mm, with similar interval enlargement of a left sacral sclerotic lesion measuring 2 cm on 2:54, previously 1.5 cm. There are probably other lesions in multiple locations which are too small to characterize. The bones in general appears subtly denser than previously. IMPRESSION: 1. High-grade small-bowel obstruction transitioning in the anterior mid to lower abdomen in the proximal ileum, with a sharply angulated transitional segment with surrounding mesenteric edema.  Etiology is most likely adhesive disease. Wall thickening of the transitional segment could be related to enteritis or ischemia but there is no pneumatosis. 2. Increased scattered ascites in the abdomen and pelvis, mild amount. 3. Diverting loop colostomy left lower abdomen with similar appearance of rectosigmoid junction thickening, adjacent stranding and adjacent presacral fluid and soft tissue thickening. 4. Left perirectal lymph nodes stable to slightly improved since 05/08/2022 but significantly smaller than on the earlier studies. 5. Shrinking hepatic metastases, but at the same time evidence of progression in osseous metastatic disease with a new lesion in the left superior pubic ramus and enlarging lesions in the sacrum. 6. Cholelithiasis. 7. Chronic thickened folds in the jejunum. Electronically Signed   By: Telford Nab M.D.   On: 06/27/2022 02:26    Medications / Allergies: per chart  Antibiotics: Anti-infectives (From admission, onward)    Start     Dose/Rate Route Frequency Ordered Stop   06/27/22 1200  piperacillin-tazobactam (ZOSYN) IVPB 3.375 g        3.375 g 12.5 mL/hr over 240 Minutes Intravenous Every 8 hours 06/27/22 1115           Note: Portions of this report may have  been transcribed using voice recognition software. Every effort was made to ensure accuracy; however, inadvertent computerized transcription errors may be present.   Any transcriptional errors that result from this process are unintentional.    Adin Hector, MD, FACS, MASCRS Esophageal, Gastrointestinal & Colorectal Surgery Robotic and Minimally Invasive Surgery  [facility]. [address], [place], [place] [address] [phone] Fax [phone] Main  CONTACT INFORMATION:  Weekday (9AM-5PM): Call [facility] main office at [phone]  Weeknight (5PM-9AM) or Weekend/Holiday: Check [email or web address] (password " [email or web address]") for General Surgery  [facility] coverage  (Please, do not use SecureChat as it is not reliable communication to reach operating surgeons for immediate patient care)      06/28/2022  8:41 AM

## 2022-06-29 ENCOUNTER — Encounter: Payer: Self-pay | Admitting: Internal Medicine

## 2022-06-29 ENCOUNTER — Encounter: Payer: Self-pay | Admitting: Oncology

## 2022-06-29 ENCOUNTER — Other Ambulatory Visit: Payer: Self-pay | Admitting: *Deleted

## 2022-06-29 DIAGNOSIS — K56609 Unspecified intestinal obstruction, unspecified as to partial versus complete obstruction: Secondary | ICD-10-CM | POA: Diagnosis not present

## 2022-06-29 DIAGNOSIS — C7951 Secondary malignant neoplasm of bone: Secondary | ICD-10-CM

## 2022-06-29 LAB — BASIC METABOLIC PANEL
Anion gap: 4 — ABNORMAL LOW (ref 5–15)
BUN: <5 mg/dL — ABNORMAL LOW (ref 8–23)
CO2: 26 mmol/L (ref 22–32)
Calcium: 7.6 mg/dL — ABNORMAL LOW (ref 8.9–10.3)
Chloride: 109 mmol/L (ref 98–111)
Creatinine, Ser: 0.36 mg/dL — ABNORMAL LOW (ref 0.44–1.00)
GFR, Estimated: >60 mL/min (ref >60)
Glucose, Bld: 85 mg/dL (ref 70–99)
Potassium: 3.3 mmol/L — ABNORMAL LOW (ref 3.5–5.1)
Sodium: 139 mmol/L (ref 135–145)

## 2022-06-29 LAB — CBC
HCT: 25.7 % — ABNORMAL LOW (ref 36.0–46.0)
Hemoglobin: 8.2 g/dL — ABNORMAL LOW (ref 12.0–15.0)
MCH: 31.1 pg (ref 26.0–34.0)
MCHC: 31.9 g/dL (ref 30.0–36.0)
MCV: 97.3 fL (ref 80.0–100.0)
Platelets: 26 10*3/uL — CL (ref 150–400)
RBC: 2.64 MIL/uL — ABNORMAL LOW (ref 3.87–5.11)
RDW: 16.5 % — ABNORMAL HIGH (ref 11.5–15.5)
WBC: 9.2 10*3/uL (ref 4.0–10.5)
nRBC: 0 % (ref 0.0–0.2)

## 2022-06-29 LAB — HIV ANTIBODY (ROUTINE TESTING W REFLEX): HIV Screen 4th Generation wRfx: NONREACTIVE

## 2022-06-29 MED ORDER — POTASSIUM CHLORIDE CRYS ER 20 MEQ PO TBCR
40.0000 meq | EXTENDED_RELEASE_TABLET | Freq: Once | ORAL | Status: AC
  Administered 2022-06-29: 40 meq via ORAL
  Filled 2022-06-29: qty 2

## 2022-06-29 MED ORDER — HEPARIN SOD (PORK) LOCK FLUSH 100 UNIT/ML IV SOLN
500.0000 [IU] | INTRAVENOUS | Status: AC | PRN
  Administered 2022-06-29: 500 [IU]

## 2022-06-29 NOTE — Discharge Summary (Signed)
Physician Discharge Summary  Karen Kaufman VFI:[id number] DOB: [birth year] DOA: 06/26/2022  PCP: Gwenlyn Perking, FNP  Admit date: 06/26/2022 Discharge date: 06/29/2022  Admitted From: Home Disposition:  Home  Recommendations for Outpatient Follow-up:  Follow up with PCP in 1 week  Discharge Condition: Stable CODE STATUS: Full  Diet recommendation: Regular   Brief/Interim Summary: Karen Kaufman is a 61 y.o. 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  She has been constipated for the past few days.  Her appetite is decreased.  CT abdomen/pelvis with contrast showed a high-grade small bowel obstruction with a sharply angulated transitional segment with surrounding mesenteric edema.  There is wall thickening of the transitional segment that could be related to enteritis or ischemia but there is no pneumatosis.  With a mild amount increased scattered ascites in the abdomen and pelvis.  Diverting loop colostomy in left lower abdomen with similar appearance of rectosigmoid junction thickening, adjacent stranding and adjacent presacral fluid and soft tissue thickening.  Cholelithiasis.  Chronic thickened folds of the jejunum.  Shrinking hepatic metastasis.  However, there is progression in osseous metastatic disease with new lesion in the left superior pubic ramus and enlarging lesions in the sacrum.  Please see images and full radiology report for further details.   Patient was admitted for small bowel obstruction and general surgery consulted.  Patient was managed with conservative management including NG tube, IV fluid and empiric Zosyn with clinical improvement.  NG tube was clamped and removed and diet advanced  with good toleration.  On day of discharge, patient was feeling well and ready to go home.  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 Benay Spice    Anemia and thrombocytopenia in setting of chemotherapy -Monitor  Hypokalemia -Replace  Discharge Instructions  Discharge Instructions     Call MD for:  difficulty breathing, headache or visual disturbances   Complete by: As directed    Call MD for:  extreme fatigue   Complete by: As directed    Call MD for:  persistant dizziness or light-headedness   Complete by: As directed    Call MD for:  persistant nausea and vomiting   Complete by: As directed    Call MD for:  severe uncontrolled pain   Complete by: As directed    Call MD for:  temperature >100.4   Complete by: As directed    Diet general   Complete by: As directed    Discharge instructions   Complete by: As directed    You were cared for by a hospitalist during your hospital stay. If you have any questions about your discharge medications or the care you received while you were in the hospital after you are discharged, you can call the unit and ask to speak with the hospitalist on call if the hospitalist that took care of you is not available. Once you are discharged, your  primary care physician will handle any further medical issues. Please note that NO REFILLS for any discharge medications will be authorized once you are discharged, as it is imperative that you return to your primary care physician (or establish a relationship with a primary care physician if you do not  have one) for your aftercare needs so that they can reassess your need for medications and monitor your lab values.   Increase activity slowly   Complete by: As directed       Allergies as of 06/29/2022       Reactions   Shellfish Allergy Anaphylaxis   Decadron [dexamethasone] Other (See Comments)   Severe acid reflux   Dilaudid [hydromorphone] Other (See Comments)   Burning sensation        Medication List     TAKE these medications    atorvastatin 20 MG tablet Commonly known as: LIPITOR Take 20 mg by mouth daily.   famotidine 20 MG tablet Commonly known as: PEPCID Take 20 mg by mouth 2 (two) times daily as needed for heartburn.   HYDROcodone-acetaminophen 5-325 MG tablet Commonly known as: NORCO/VICODIN Take 1 tablet by mouth every 4 (four) hours as needed for pain.   lidocaine-prilocaine cream Commonly known as: EMLA Apply 1 Application topically as needed (port access).   magnesium oxide 400 (240 Mg) MG tablet Commonly known as: MAG-OX Take 400 mg by mouth daily.   ondansetron 8 MG tablet Commonly known as: ZOFRAN Take 8 mg by mouth every 8 (eight) hours as needed for nausea/vomiting.   pantoprazole 20 MG tablet Commonly known as: PROTONIX Take 20 mg by mouth daily.   prochlorperazine 10 MG tablet Commonly known as: COMPAZINE Take 10 mg by mouth every 6 (six) hours as needed for nausea or vomiting.        Follow-up Information     Gwenlyn Perking, FNP. Schedule an appointment as soon as possible for a visit in 1 week(s).   Specialty: Family Medicine Contact information: [place] [address] [phone]                Allergies  Allergen Reactions   Shellfish Allergy Anaphylaxis   Decadron [Dexamethasone] Other (See Comments)    Severe acid reflux   Dilaudid [Hydromorphone] Other (See Comments)    Burning sensation    Consultations: General surgery    Procedures/Studies: DG Abd Portable 1V-Small Bowel  Obstruction Protocol-initial, 8 hr delay  Result Date: 06/27/2022 CLINICAL DATA:  SBO protocol, 8 hour delay image. History of colorectal cancer with colostomy. EXAM: PORTABLE ABDOMEN - 1 VIEW COMPARISON:  06/27/2022 at 0915 hours FINDINGS: Enteric tube terminates in the gastric cardia. Contrast in the stomach. Contrast opacifies the ascending, transverse, and descending colon to the left mid abdomen. This likely corresponds to the site of the patient's left mid abdominal colostomy. Excretory contrast in the bladder. IMPRESSION: Contrast throughout the colon, likely to the level of the patient's left mid abdominal colostomy. Electronically Signed   By: Julian Hy M.D.   On: 06/27/2022 21:00   DG Chest 1 View  Result Date: 06/27/2022 CLINICAL DATA:  NG tube placement. EXAM: CHEST  1 VIEW COMPARISON:  03/12/2022 and abdominal film 06/27/2022 FINDINGS: Enteric tube present with tip and side-port over the stomach in the left upper quadrant just below the hemidiaphragm. Contrast is present within the NG tube and stomach. Right IJ Port-A-Cath unchanged. Lungs are adequately inflated without consolidation or effusion. Cardiomediastinal silhouette and remainder of the exam  is unchanged. IMPRESSION: 1. No acute cardiopulmonary disease. 2. Enteric tube with tip and side-port over the stomach in the left upper quadrant. Electronically Signed   By: Marin Olp M.D.   On: 06/27/2022 12:08   DG Abd Portable 1V-Small Bowel Protocol-Position Verification  Result Date: 06/27/2022 CLINICAL DATA:  NG tube placement. EXAM: PORTABLE ABDOMEN - 1 VIEW COMPARISON:  03/18/2022 FINDINGS: NG tube is present with tip and side-port over the stomach in the left upper quadrant. Bowel gas pattern is nonobstructive. No free peritoneal air. Lung bases are clear. Remainder of the exam is unchanged. IMPRESSION: 1. Nonobstructive bowel gas pattern. 2. NG tube with tip and side-port over the stomach in the left upper quadrant.  Electronically Signed   By: Marin Olp M.D.   On: 06/27/2022 09:27   CT ABDOMEN PELVIS W CONTRAST  Result Date: 06/27/2022 CLINICAL DATA:  Acute abdominal pain with history of colorectal cancer and colostomy. EXAM: CT ABDOMEN AND PELVIS WITH CONTRAST TECHNIQUE: Multidetector CT imaging of the abdomen and pelvis was performed using the standard protocol following bolus administration of intravenous contrast. RADIATION DOSE REDUCTION: This exam was performed according to the departmental dose-optimization program which includes automated exposure control, adjustment of the mA and/or kV according to patient size and/or use of iterative reconstruction technique. CONTRAST:  157m OMNIPAQUE IOHEXOL 300 MG/ML  SOLN COMPARISON:  CT with oral contrast only 05/08/2022, CTs without contrast 03/11/2022 and 02/08/2022. FINDINGS: Lower chest: Lung bases are clear apart from minimal posterior atelectasis. Epicardial lymph nodes to the right are again noted up to 8.6 mm in short axis but have not increased in size. There are bilateral breast implants partially visible. Hepatobiliary: In the lateral liver dome on 2:12 there is a hypodense lesion compatible with metastasis measuring 1.6 cm in segment 7. This was larger previously, particularly on 03/11/2022 when it measured 2.4 cm. On axial images 22 and 23 there are additional hypodense lesions inferiorly in segment 6, both approaching 1 cm which were also previously about double the size. There are few scattered hypodensities which are too small to characterize and a small cyst in the left lobe anteriorly. There are small stones in the gallbladder but no wall thickening or biliary dilatation. Pancreas: Unremarkable. Spleen: Unremarkable. Adrenals/Urinary Tract: There is no adrenal mass. In the lower pole right kidney there is a 1.6 cm cyst of 23 Hounsfield units. No follow-up imaging is required. Both kidneys otherwise enhance normally. There is no urinary stone or  obstruction. No bladder thickening. Stomach/Bowel: Output of the distal descending colon through a left mid abdominal diverting loop colostomy. Thickened folds are chronically noted in the jejunum. Beginning at the ligament of Treitz there is small bowel dilatation in the left upper to mid and lower abdomen with small bowel segments up to 4.1 cm. The transitional segment is a sharply angulated loop anteriorly in the mid to lower abdomen best seen on axial images 42-45 of series 2, with thickened folds at the transition. The rest of the small bowel is collapsed in the lower abdomen and upper pelvis. The appendix is normal. Large bowel unremarkable except at the rectosigmoid junction where circumferential wall thickening continues to be seen with perirectal stranding. Presacral stranding and mild layering presacral fluid also continue to be noted. There is increased scattered ascites along the mesenteric folds in the mid to lower abdomen as well. There is no bowel pneumatosis or portal venous gas. Vascular/Lymphatic: No significant vascular findings. Left perirectal lymph nodes are again noted up  to 8.2 mm in short axis, slightly improved. No adenopathy is seen elsewhere in the pelvis or in the abdomen. Reproductive: Status post hysterectomy. No adnexal masses. Other: As above there is scattered ascites in the abdomen and pelvis. There is no free hemorrhage, abscess or free air. Musculoskeletal: Bony sclerosis and mild-to-moderate pathologic compression fractures are again noted of L1 and 3. Lytic metastasis in the posterior L5 body measuring 1.8 cm is less lucent than previously consistent with a treated metastasis. Mixed attenuation sclerosis and lucency is redemonstrated in the left pubic bone with a new lytic lesion measuring 1.9 cm in the left superior pubic ramus on 2:72. Also, a sclerotic lesion in the right hemisacrum on 2:51 now measures 1.2 cm and was previously 7 mm, with similar interval enlargement of a  left sacral sclerotic lesion measuring 2 cm on 2:54, previously 1.5 cm. There are probably other lesions in multiple locations which are too small to characterize. The bones in general appears subtly denser than previously. IMPRESSION: 1. High-grade small-bowel obstruction transitioning in the anterior mid to lower abdomen in the proximal ileum, with a sharply angulated transitional segment with surrounding mesenteric edema. Etiology is most likely adhesive disease. Wall thickening of the transitional segment could be related to enteritis or ischemia but there is no pneumatosis. 2. Increased scattered ascites in the abdomen and pelvis, mild amount. 3. Diverting loop colostomy left lower abdomen with similar appearance of rectosigmoid junction thickening, adjacent stranding and adjacent presacral fluid and soft tissue thickening. 4. Left perirectal lymph nodes stable to slightly improved since 05/08/2022 but significantly smaller than on the earlier studies. 5. Shrinking hepatic metastases, but at the same time evidence of progression in osseous metastatic disease with a new lesion in the left superior pubic ramus and enlarging lesions in the sacrum. 6. Cholelithiasis. 7. Chronic thickened folds in the jejunum. Electronically Signed   By: Telford Nab M.D.   On: 06/27/2022 02:26       Discharge Exam: Vitals:   06/28/22 2010 06/29/22 0506  BP: 101/67 116/69  Pulse: 73 97  Resp: 16 18  Temp: 98.3 F (36.8 C) 98 F (36.7 C)  SpO2: 100% 100%    General: Pt is alert, awake, not in acute distress Cardiovascular: RRR, S1/S2 +, no edema Respiratory: CTA bilaterally, no wheezing, no rhonchi, no respiratory distress, no conversational dyspnea  Abdominal: Soft, NT, ND, bowel sounds +, +stool in colostomy bag  Extremities: no edema, no cyanosis Psych: Normal mood and affect, stable judgement and insight     The results of significant diagnostics from this hospitalization (including imaging,  microbiology, ancillary and laboratory) are listed below for reference.     Microbiology: No results found for this or any previous visit (from the past 240 hour(s)).   Labs: BNP (last 3 results) No results for input(s): "BNP" in the last 8760 hours. Basic Metabolic Panel: Recent Labs  Lab 06/26/22 2356 06/28/22 0531 06/29/22 0354  NA 139 136 139  K 3.8 4.0 3.3*  CL 105 109 109  CO2 '27 24 26  '$ GLUCOSE 123* 82 85  BUN 9 7* <5*  CREATININE 0.33* 0.40* 0.36*  CALCIUM 8.2* 7.4* 7.6*  MG 2.5*  --   --   PHOS 3.7  --   --    Liver Function Tests: Recent Labs  Lab 06/26/22 2356 06/28/22 0531  AST 39 24  ALT 51* 31  ALKPHOS 277* 214*  BILITOT 0.7 0.8  PROT 5.3* 4.1*  ALBUMIN 3.1* 2.2*  Recent Labs  Lab 06/26/22 2356  LIPASE 24   No results for input(s): "AMMONIA" in the last 168 hours. CBC: Recent Labs  Lab 06/26/22 2356 06/28/22 0531 06/29/22 0354  WBC 49.7* 20.4* 9.2  HGB 11.6* 8.6* 8.2*  HCT 35.4* 27.3* 25.7*  MCV 96.2 100.0 97.3  PLT 97* 48* 26*   Cardiac Enzymes: No results for input(s): "CKTOTAL", "CKMB", "CKMBINDEX", "TROPONINI" in the last 168 hours. BNP: Invalid input(s): "POCBNP" CBG: No results for input(s): "GLUCAP" in the last 168 hours. D-Dimer No results for input(s): "DDIMER" in the last 72 hours. Hgb A1c No results for input(s): "HGBA1C" in the last 72 hours. Lipid Profile No results for input(s): "CHOL", "HDL", "LDLCALC", "TRIG", "CHOLHDL", "LDLDIRECT" in the last 72 hours. Thyroid function studies No results for input(s): "TSH", "T4TOTAL", "T3FREE", "THYROIDAB" in the last 72 hours.  Invalid input(s): "FREET3" Anemia work up No results for input(s): "VITAMINB12", "FOLATE", "FERRITIN", "TIBC", "IRON", "RETICCTPCT" in the last 72 hours. Urinalysis    Component Value Date/Time   COLORURINE YELLOW 06/26/2022 2258   APPEARANCEUR CLEAR 06/26/2022 2258   LABSPEC 1.017 06/26/2022 2258   PHURINE 6.0 06/26/2022 2258   GLUCOSEU NEGATIVE  06/26/2022 2258   HGBUR NEGATIVE 06/26/2022 2258   BILIRUBINUR NEGATIVE 06/26/2022 2258   KETONESUR 20 (A) 06/26/2022 2258   PROTEINUR NEGATIVE 06/26/2022 2258   NITRITE NEGATIVE 06/26/2022 2258   LEUKOCYTESUR NEGATIVE 06/26/2022 2258   Sepsis Labs Recent Labs  Lab 06/26/22 2356 06/28/22 0531 06/29/22 0354  WBC 49.7* 20.4* 9.2   Microbiology No results found for this or any previous visit (from the past 240 hour(s)).   Patient was seen and examined on the day of discharge and was found to be in stable condition. Time coordinating discharge: 25 minutes including assessment and coordination of care, as well as examination of the patient.   SIGNED:  Dessa Phi, DO [facility] 06/29/2022, 10:51 AM

## 2022-06-29 NOTE — Progress Notes (Signed)
Mobility Specialist - Progress Note    06/29/22 1037  Mobility  HOB Elevated/Bed Position Self regulated  Activity Ambulated independently in hallway  Range of Motion/Exercises Active  Level of Assistance Independent  Assistive Device None  Distance Ambulated (ft) 700 ft  Activity Response Tolerated well  $Mobility charge 1 Mobility   Pt was found in recliner chair and agreeable to mobilize. Had no complaints and at EOS returned to recliner chair with all necessities in reach.  Ferd Hibbs Mobility Specialist

## 2022-06-29 NOTE — Clinical Social Work Note (Signed)
  Transition of Care (TOC) Screening Note   Patient Details  Name: Karen Kaufman Date of Birth: June 01, 1961   Transition of [facility] CM/SW Contact:    Ross Ludwig, LCSW Phone Number: 06/29/2022, 11:41 AM    Transition of Care Department [facility]) has reviewed patient and no TOC needs have been identified at this time. We will continue to monitor patient advancement through interdisciplinary progression rounds. If new patient transition needs arise, please place a TOC consult.

## 2022-06-29 NOTE — Progress Notes (Signed)
Being d/c from hospital later today. Orders placed for CBC on 9/12 to f/u on platelet count. Scheduling notified.

## 2022-06-29 NOTE — Progress Notes (Signed)
  Subjective No acute events. Feeling well, denies any abdominal pain, nausea, vomiting. Tolerating diet without issue. States she is ready to go home.  Objective: Vital signs in last 24 hours: Temp:  [98 F (36.7 C)-98.3 F (36.8 C)] 98 F (36.7 C) (09/11 0506) Pulse Rate:  [73-97] 97 (09/11 0506) Resp:  [16-18] 18 (09/11 0506) BP: (99-116)/(58-69) 116/69 (09/11 0506) SpO2:  [78 %-100 %] 100 % (09/11 0506) Last BM Date : 06/28/22  Intake/Output from previous day: 09/10 0701 - 09/11 0700 In: 2217.7 [P.O.:1080; I.V.:1000; IV Piggyback:137.7] Out: 3770 [Urine:3200; Stool:570] Intake/Output this shift: Total I/O In: 120 [P.O.:120] Out: 675 [Urine:600; Stool:75]  Gen: NAD, comfortable CV: RRR Pulm: Normal work of breathing Abd: Soft, NT/ND; ostomy productive and pink Ext: SCDs in place  Lab Results: CBC  Recent Labs    06/28/22 0531 06/29/22 0354  WBC 20.4* 9.2  HGB 8.6* 8.2*  HCT 27.3* 25.7*  PLT 48* 26*   BMET Recent Labs    06/28/22 0531 06/29/22 0354  NA 136 139  K 4.0 3.3*  CL 109 109  CO2 24 26  GLUCOSE 82 85  BUN 7* <5*  CREATININE 0.40* 0.36*  CALCIUM 7.4* 7.6*   PT/INR No results for input(s): "LABPROT", "INR" in the last 72 hours. ABG No results for input(s): "PHART", "HCO3" in the last 72 hours.  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 ([place]) 06/27/2022   Rectal  adenocarcinoma metastatic to liver ([place]) 06/27/2022   -Tolerating full liquids without any issue - adv to regular diet -Ostomy now working well, no distention on exam and her apparent obstructive process appears to have resolved -Diet as tolerated, chew food well - smoothie consistency before swallowing -If she is tolerating regular diet and remains motivated to go home, I think this would be reasonable later today  I spent a total of 36 minutes in both face-to-face and non-face-to-face activities, excluding procedures performed, for this visit on the date of this encounter.    LOS: 2 days   Nadeen Landau, MD [facility], [place]

## 2022-06-29 NOTE — Progress Notes (Signed)
MD Opyd was notified of platelets being 26.

## 2022-06-29 NOTE — Progress Notes (Signed)
Diet advanced to full liquids per MD Gross standing order.

## 2022-06-30 ENCOUNTER — Inpatient Hospital Stay: Payer: BC Managed Care – PPO

## 2022-06-30 ENCOUNTER — Encounter: Payer: Self-pay | Admitting: Urology

## 2022-06-30 ENCOUNTER — Telehealth: Payer: Self-pay | Admitting: *Deleted

## 2022-06-30 ENCOUNTER — Encounter: Payer: Self-pay | Admitting: Oncology

## 2022-06-30 DIAGNOSIS — D696 Thrombocytopenia, unspecified: Secondary | ICD-10-CM

## 2022-06-30 DIAGNOSIS — K56609 Unspecified intestinal obstruction, unspecified as to partial versus complete obstruction: Secondary | ICD-10-CM | POA: Diagnosis not present

## 2022-06-30 DIAGNOSIS — C20 Malignant neoplasm of rectum: Secondary | ICD-10-CM

## 2022-06-30 DIAGNOSIS — R1010 Upper abdominal pain, unspecified: Secondary | ICD-10-CM | POA: Diagnosis not present

## 2022-06-30 LAB — CBC WITH DIFFERENTIAL (CANCER CENTER ONLY)
Abs Immature Granulocytes: 0.06 10*3/uL (ref 0.00–0.07)
Basophils Absolute: 0 10*3/uL (ref 0.0–0.1)
Basophils Relative: 1 %
Eosinophils Absolute: 0 10*3/uL (ref 0.0–0.5)
Eosinophils Relative: 1 %
HCT: 28.9 % — ABNORMAL LOW (ref 36.0–46.0)
Hemoglobin: 9.6 g/dL — ABNORMAL LOW (ref 12.0–15.0)
Immature Granulocytes: 1 %
Lymphocytes Relative: 9 %
Lymphs Abs: 0.4 10*3/uL — ABNORMAL LOW (ref 0.7–4.0)
MCH: 31.1 pg (ref 26.0–34.0)
MCHC: 33.2 g/dL (ref 30.0–36.0)
MCV: 93.5 fL (ref 80.0–100.0)
Monocytes Absolute: 0.3 10*3/uL (ref 0.1–1.0)
Monocytes Relative: 6 %
Neutro Abs: 3.5 10*3/uL (ref 1.7–7.7)
Neutrophils Relative %: 82 %
Platelet Count: 35 10*3/uL — ABNORMAL LOW (ref 150–400)
RBC: 3.09 MIL/uL — ABNORMAL LOW (ref 3.87–5.11)
RDW: 16 % — ABNORMAL HIGH (ref 11.5–15.5)
WBC Count: 4.3 10*3/uL (ref 4.0–10.5)
nRBC: 0 % (ref 0.0–0.2)

## 2022-06-30 LAB — ABO/RH: ABO/RH(D): A POS

## 2022-06-30 NOTE — Telephone Encounter (Signed)
Called patient to f/u on why she was not here yet for her labs at 1:30. She thought the lab was tomorrow. Informed her that her platelets are very low and she needs to come today. She will be here within the hour.

## 2022-06-30 NOTE — Telephone Encounter (Signed)
Reviewed CBC results with patient today. Will re-check on Friday at [facility] at 2:45. Scheduling message sent. Instructed her to avoid any activity that could cause trauma. Does not need lab again tomorrow.

## 2022-07-01 ENCOUNTER — Encounter: Payer: Self-pay | Admitting: Urology

## 2022-07-01 ENCOUNTER — Inpatient Hospital Stay: Payer: BC Managed Care – PPO

## 2022-07-01 ENCOUNTER — Other Ambulatory Visit: Payer: Self-pay

## 2022-07-01 ENCOUNTER — Ambulatory Visit
Admission: RE | Admit: 2022-07-01 | Discharge: 2022-07-01 | Disposition: A | Discharge status: Home / Self Care | Payer: BC Managed Care – PPO | Source: Ambulatory Visit | Attending: Radiation Oncology | Admitting: Radiation Oncology

## 2022-07-01 DIAGNOSIS — C7951 Secondary malignant neoplasm of bone: Secondary | ICD-10-CM

## 2022-07-01 LAB — RAD ONC ARIA SESSION SUMMARY
Course Elapsed Days: 51
Plan Fractions Treated to Date: 1
Plan Prescribed Dose Per Fraction: 6 Gy
Plan Total Fractions Prescribed: 5
Plan Total Prescribed Dose: 30 Gy
Reference Point Dosage Given to Date: 6 Gy
Reference Point Session Dosage Given: 6 Gy
Session Number: 11

## 2022-07-02 ENCOUNTER — Emergency Department (HOSPITAL_COMMUNITY): Payer: BC Managed Care – PPO

## 2022-07-02 ENCOUNTER — Encounter (HOSPITAL_COMMUNITY): Payer: Self-pay | Admitting: Emergency Medicine

## 2022-07-02 ENCOUNTER — Other Ambulatory Visit: Payer: Self-pay | Admitting: Oncology

## 2022-07-02 ENCOUNTER — Inpatient Hospital Stay (HOSPITAL_COMMUNITY)
Admission: EM | Admit: 2022-07-02 | Discharge: 2022-07-05 | Disposition: A | Discharge status: Home / Self Care | Payer: BC Managed Care – PPO | Source: Home / Self Care | Attending: Family Medicine | Admitting: Family Medicine

## 2022-07-02 ENCOUNTER — Ambulatory Visit: Payer: BC Managed Care – PPO | Admitting: Radiation Oncology

## 2022-07-02 DIAGNOSIS — Z91013 Allergy to seafood: Secondary | ICD-10-CM

## 2022-07-02 DIAGNOSIS — K56609 Unspecified intestinal obstruction, unspecified as to partial versus complete obstruction: Secondary | ICD-10-CM | POA: Diagnosis present

## 2022-07-02 DIAGNOSIS — Z933 Colostomy status: Secondary | ICD-10-CM

## 2022-07-02 DIAGNOSIS — Z9841 Cataract extraction status, right eye: Secondary | ICD-10-CM

## 2022-07-02 DIAGNOSIS — Z9842 Cataract extraction status, left eye: Secondary | ICD-10-CM

## 2022-07-02 DIAGNOSIS — C787 Secondary malignant neoplasm of liver and intrahepatic bile duct: Secondary | ICD-10-CM | POA: Diagnosis present

## 2022-07-02 DIAGNOSIS — E7800 Pure hypercholesterolemia, unspecified: Secondary | ICD-10-CM | POA: Diagnosis present

## 2022-07-02 DIAGNOSIS — Z83438 Family history of other disorder of lipoprotein metabolism and other lipidemia: Secondary | ICD-10-CM

## 2022-07-02 DIAGNOSIS — Z888 Allergy status to other drugs, medicaments and biological substances status: Secondary | ICD-10-CM

## 2022-07-02 DIAGNOSIS — C20 Malignant neoplasm of rectum: Secondary | ICD-10-CM

## 2022-07-02 DIAGNOSIS — E876 Hypokalemia: Secondary | ICD-10-CM | POA: Diagnosis present

## 2022-07-02 DIAGNOSIS — C7951 Secondary malignant neoplasm of bone: Secondary | ICD-10-CM | POA: Diagnosis present

## 2022-07-02 DIAGNOSIS — C19 Malignant neoplasm of rectosigmoid junction: Secondary | ICD-10-CM | POA: Diagnosis present

## 2022-07-02 DIAGNOSIS — C189 Malignant neoplasm of colon, unspecified: Secondary | ICD-10-CM | POA: Diagnosis present

## 2022-07-02 DIAGNOSIS — C772 Secondary and unspecified malignant neoplasm of intra-abdominal lymph nodes: Secondary | ICD-10-CM | POA: Diagnosis present

## 2022-07-02 DIAGNOSIS — T451X5A Adverse effect of antineoplastic and immunosuppressive drugs, initial encounter: Secondary | ICD-10-CM | POA: Diagnosis present

## 2022-07-02 DIAGNOSIS — K219 Gastro-esophageal reflux disease without esophagitis: Secondary | ICD-10-CM | POA: Diagnosis present

## 2022-07-02 DIAGNOSIS — Z90710 Acquired absence of both cervix and uterus: Secondary | ICD-10-CM

## 2022-07-02 DIAGNOSIS — E785 Hyperlipidemia, unspecified: Secondary | ICD-10-CM | POA: Diagnosis present

## 2022-07-02 DIAGNOSIS — Z85828 Personal history of other malignant neoplasm of skin: Secondary | ICD-10-CM

## 2022-07-02 DIAGNOSIS — M4854XA Collapsed vertebra, not elsewhere classified, thoracic region, initial encounter for fracture: Secondary | ICD-10-CM | POA: Diagnosis present

## 2022-07-02 DIAGNOSIS — R1010 Upper abdominal pain, unspecified: Secondary | ICD-10-CM | POA: Diagnosis present

## 2022-07-02 DIAGNOSIS — Z885 Allergy status to narcotic agent status: Secondary | ICD-10-CM

## 2022-07-02 DIAGNOSIS — Z79899 Other long term (current) drug therapy: Secondary | ICD-10-CM

## 2022-07-02 DIAGNOSIS — D61810 Antineoplastic chemotherapy induced pancytopenia: Secondary | ICD-10-CM | POA: Diagnosis present

## 2022-07-02 DIAGNOSIS — D61818 Other pancytopenia: Secondary | ICD-10-CM

## 2022-07-02 LAB — CBC WITH DIFFERENTIAL/PLATELET
Abs Immature Granulocytes: 0.34 10*3/uL — ABNORMAL HIGH (ref 0.00–0.07)
Basophils Absolute: 0 10*3/uL (ref 0.0–0.1)
Basophils Relative: 1 %
Eosinophils Absolute: 0 10*3/uL (ref 0.0–0.5)
Eosinophils Relative: 0 %
HCT: 34.5 % — ABNORMAL LOW (ref 36.0–46.0)
Hemoglobin: 11.4 g/dL — ABNORMAL LOW (ref 12.0–15.0)
Immature Granulocytes: 12 %
Lymphocytes Relative: 15 %
Lymphs Abs: 0.4 10*3/uL — ABNORMAL LOW (ref 0.7–4.0)
MCH: 31.4 pg (ref 26.0–34.0)
MCHC: 33 g/dL (ref 30.0–36.0)
MCV: 95 fL (ref 80.0–100.0)
Monocytes Absolute: 0.8 10*3/uL (ref 0.1–1.0)
Monocytes Relative: 28 %
Neutro Abs: 1.3 10*3/uL — ABNORMAL LOW (ref 1.7–7.7)
Neutrophils Relative %: 44 %
Platelets: 58 10*3/uL — ABNORMAL LOW (ref 150–400)
RBC: 3.63 MIL/uL — ABNORMAL LOW (ref 3.87–5.11)
RDW: 16.5 % — ABNORMAL HIGH (ref 11.5–15.5)
WBC: 2.9 10*3/uL — ABNORMAL LOW (ref 4.0–10.5)
nRBC: 2.4 % — ABNORMAL HIGH (ref 0.0–0.2)

## 2022-07-02 LAB — URINALYSIS, ROUTINE W REFLEX MICROSCOPIC
Bilirubin Urine: NEGATIVE
Glucose, UA: NEGATIVE mg/dL
Hgb urine dipstick: NEGATIVE
Ketones, ur: NEGATIVE mg/dL
Leukocytes,Ua: NEGATIVE
Nitrite: NEGATIVE
Protein, ur: 30 mg/dL — AB
Specific Gravity, Urine: 1.018 (ref 1.005–1.030)
pH: 6 (ref 5.0–8.0)

## 2022-07-02 LAB — COMPREHENSIVE METABOLIC PANEL
ALT: 38 U/L (ref 0–44)
AST: 34 U/L (ref 15–41)
Albumin: 3.1 g/dL — ABNORMAL LOW (ref 3.5–5.0)
Alkaline Phosphatase: 265 U/L — ABNORMAL HIGH (ref 38–126)
Anion gap: 9 (ref 5–15)
BUN: 6 mg/dL — ABNORMAL LOW (ref 8–23)
CO2: 25 mmol/L (ref 22–32)
Calcium: 8.7 mg/dL — ABNORMAL LOW (ref 8.9–10.3)
Chloride: 102 mmol/L (ref 98–111)
Creatinine, Ser: 0.49 mg/dL (ref 0.44–1.00)
GFR, Estimated: >60 mL/min (ref >60)
Glucose, Bld: 131 mg/dL — ABNORMAL HIGH (ref 70–99)
Potassium: 3.7 mmol/L (ref 3.5–5.1)
Sodium: 136 mmol/L (ref 135–145)
Total Bilirubin: 0.8 mg/dL (ref 0.3–1.2)
Total Protein: 5.5 g/dL — ABNORMAL LOW (ref 6.5–8.1)

## 2022-07-02 LAB — LIPASE, BLOOD: Lipase: 20 U/L (ref 11–51)

## 2022-07-02 MED ORDER — IOHEXOL 300 MG/ML  SOLN
100.0000 mL | Freq: Once | INTRAMUSCULAR | Status: AC | PRN
  Administered 2022-07-02: 100 mL via INTRAVENOUS

## 2022-07-02 MED ORDER — PANTOPRAZOLE SODIUM 40 MG IV SOLR
40.0000 mg | INTRAVENOUS | Status: DC
  Administered 2022-07-02 – 2022-07-04 (×3): 40 mg via INTRAVENOUS
  Filled 2022-07-02 (×3): qty 10

## 2022-07-02 MED ORDER — SODIUM CHLORIDE 0.9% FLUSH: 10.0000 mL | INTRAVENOUS | Status: DC | PRN

## 2022-07-02 MED ORDER — MORPHINE SULFATE (PF) 4 MG/ML IV SOLN
4.0000 mg | INTRAVENOUS | Status: DC | PRN
  Administered 2022-07-02 – 2022-07-04 (×8): 4 mg via INTRAVENOUS
  Filled 2022-07-02 (×8): qty 1

## 2022-07-02 MED ORDER — ONDANSETRON HCL 4 MG/2ML IJ SOLN
4.0000 mg | Freq: Once | INTRAMUSCULAR | Status: AC
  Administered 2022-07-02: 4 mg via INTRAVENOUS

## 2022-07-02 MED ORDER — PROCHLORPERAZINE EDISYLATE 10 MG/2ML IJ SOLN: 10.0000 mg | Freq: Four times a day (QID) | INTRAMUSCULAR | Status: DC | PRN

## 2022-07-02 MED ORDER — CHLORHEXIDINE GLUCONATE CLOTH 2 % EX PADS
6.0000 | MEDICATED_PAD | Freq: Every day | CUTANEOUS | Status: DC
  Administered 2022-07-03 – 2022-07-05 (×3): 6 via TOPICAL

## 2022-07-02 MED ORDER — FENTANYL CITRATE PF 50 MCG/ML IJ SOSY
100.0000 ug | PREFILLED_SYRINGE | Freq: Once | INTRAMUSCULAR | Status: AC
  Administered 2022-07-02: 100 ug via INTRAVENOUS
  Filled 2022-07-02: qty 2

## 2022-07-02 MED ORDER — SODIUM CHLORIDE 0.9 % IV SOLN: INTRAVENOUS | Status: DC

## 2022-07-02 MED ORDER — SODIUM CHLORIDE 0.9% FLUSH
10.0000 mL | Freq: Two times a day (BID) | INTRAVENOUS | Status: DC
  Administered 2022-07-02 – 2022-07-03 (×3): 10 mL

## 2022-07-02 MED ORDER — DIATRIZOATE MEGLUMINE & SODIUM 66-10 % PO SOLN
90.0000 mL | Freq: Once | ORAL | Status: AC
  Administered 2022-07-02: 90 mL via NASOGASTRIC
  Filled 2022-07-02: qty 90

## 2022-07-02 MED ORDER — SODIUM CHLORIDE 0.9 % IV BOLUS
1000.0000 mL | Freq: Once | INTRAVENOUS | Status: AC
  Administered 2022-07-02: 1000 mL via INTRAVENOUS

## 2022-07-02 NOTE — ED Provider Triage Note (Signed)
Emergency Medicine Provider Triage Evaluation Note  Karen Kaufman , a 61 y.o. female  was evaluated in triage.  Pt complains of severe upper abdominal pain and discomfort.  Patient was recently admitted for SBO and discharged home on Monday.  She had radiation yesterday where her dose was tripled.  She now has severe tenderness over the radiation site at the epigastrium.  More mild and diffuse abdominal tenderness throughout.  She states that there seems to be some inflammation of the colon at her ostomy site.  Endorses nausea.  Denies fevers.  Review of Systems  Positive:  Negative:   Physical Exam  BP 112/73 (BP Location: Right Arm)   Pulse 77   Temp 97.7 F (36.5 C) (Oral)   Resp 16   SpO2 99%  Gen:   Awake, no distress   Resp:  Normal effort  MSK:   Moves extremities without difficulty  Other:  Tender to palpation epigastrium at radiation site.  No obvious distention.  Medical Decision Making  Medically screening exam initiated at 10:41 AM.  Appropriate orders placed.  Karen Kaufman was informed that the remainder of the evaluation will be completed by another provider, this initial triage assessment does not replace that evaluation, and the importance of remaining in the ED until their evaluation is complete.     Tonye Pearson, [place] 07/02/22 1043

## 2022-07-02 NOTE — H&P (Signed)
History and Physical    Patient: Karen Kaufman ZDG:[id number] DOB: February 08, 1961 DOA: 07/02/2022 DOS: the patient was seen and examined on 07/02/2022 PCP: Gwenlyn Perking, FNP  Patient coming from: Home  Chief Complaint: Abdominal pain  HPI: Karen Kaufman is a 61 y.o. 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 Presenting with abdominal pain. She reports that after her recent discharge for SBO, she was feeling pretty normal. She had a radiation therapy session yesterday. After that session, she began to have severe spasms in her epigastric area. They did not respond to her regular meds. As the evening and night progressed, she noted that her ostomy output had stopped and she had episodes of N/V. There was no hematemesis. She did not have any fever or sick contacts. When her symptoms did not improve this morning, she decided to come to the ED for assistance. She denies any other aggravating or alleviating factors.   Review of Systems: As mentioned in the history of present illness. All other systems reviewed and are negative. Past Medical History:  Diagnosis Date   Allergy    Cancer ([place])    skin cancer removed by dermatology   Cataract    Colorectal cancer ([place])    Colostomy care [facility])    Hyperlipidemia    Past Surgical History:  Procedure Laterality Date   ABDOMINAL HYSTERECTOMY  2002   BOWEL DECOMPRESSION N/A 02/09/2022   Procedure: BOWEL DECOMPRESSION;  Surgeon: Ladene Artist, MD;  Location: [place];  Service: Gastroenterology;  Laterality: N/A;   [place]   craniotomy-removal of tumor from behind right eye   BREAST SURGERY  2003   breast implants   COLONOSCOPY WITH PROPOFOL N/A 02/09/2022   Procedure: COLONOSCOPY WITH PROPOFOL;  Surgeon: Ladene Artist, MD;  Location: [place];  Service: Gastroenterology;  Laterality: N/A;   EYE SURGERY     bilateral cataract removal   FLEXIBLE SIGMOIDOSCOPY N/A  02/09/2022   Procedure: FLEXIBLE SIGMOIDOSCOPY;  Surgeon: Ladene Artist, MD;  Location: [place];  Service: Gastroenterology;  Laterality: N/A;   ILEO LOOP COLOSTOMY CLOSURE N/A 03/12/2022   Procedure: LAPAROSCOPIC ASSISTED DIVERTING COLOSTOMY;  Surgeon: Michael Boston, MD;  Location: [facility];  Service: General;  Laterality: N/A;   IR IMAGING GUIDED PORT INSERTION  02/20/2022   IR US GUIDE BX ASP/DRAIN  02/20/2022   POLYPECTOMY  02/09/2022   Procedure: POLYPECTOMY;  Surgeon: Ladene Artist, MD;  Location: [facility] ENDOSCOPY;  Service: Gastroenterology;;   Social History:  reports that she has never smoked. She has never used smokeless tobacco. She reports that she does not currently use alcohol. She reports that she does not currently use drugs.  Allergies  Allergen Reactions   Iodine Anaphylaxis    NOT CT CONTRAST    Shellfish Allergy Anaphylaxis   Shellfish Allergy Anaphylaxis   Decadron [Dexamethasone] Other (See Comments)    Severe acid reflux   Dilaudid [Hydromorphone] Other (See Comments)    Burning sensation   Hydromorphone     "Burning Sensation"    Family History  Problem Relation Age of Onset   Cancer Mother        breast   Diabetes Mother    Hyperlipidemia Mother    Hypertension Mother    Cancer Father        prostate   Arthritis Maternal Grandmother    Arthritis Maternal Grandfather    Cancer Maternal Grandfather  hodgkins lymphoma   Arthritis Paternal Grandmother    Arthritis Paternal Grandfather    Diabetes Paternal Grandfather    Colon cancer Neg Hx    Pancreatic cancer Neg Hx    Esophageal cancer Neg Hx     Prior to Admission medications   Medication Sig Start Date End Date Taking? Authorizing Provider  atorvastatin (LIPITOR) 20 MG tablet Take 1 tablet (20 mg total) by mouth daily. 01/26/22   Gwenlyn Perking, FNP  atorvastatin (LIPITOR) 20 MG tablet Take 20 mg by mouth daily. 05/03/22   [provider]  famotidine (PEPCID) 20 MG tablet Take 1  tablet (20 mg total) by mouth 2 (two) times daily. 06/21/22   Nicholas Lose, MD  famotidine (PEPCID) 20 MG tablet Take 20 mg by mouth 2 (two) times daily as needed for heartburn. 06/22/22   [provider]  HYDROcodone-acetaminophen (NORCO) 5-325 MG tablet Take 1-2 tablets by mouth every 4 (four) hours as needed for moderate pain. 06/10/22   Owens Shark, NP  HYDROcodone-acetaminophen (NORCO/VICODIN) 5-325 MG tablet Take 1 tablet by mouth every 4 (four) hours as needed for pain. 06/10/22   [provider]  lidocaine-prilocaine (EMLA) cream Apply to port site 1-2 hours prior to use 02/27/22   Owens Shark, NP  lidocaine-prilocaine (EMLA) cream Apply 1 Application topically as needed (port access).    [provider]  magnesium oxide (MAG-OX) 400 (240 Mg) MG tablet Take 400 mg by mouth daily.    [provider]  magnesium oxide (MAG-OX) 400 MG tablet Take 400 mg by mouth daily.    [provider]  ondansetron (ZOFRAN) 8 MG tablet Take 1 tablet (8 mg total) by mouth every 8 (eight) hours as needed for nausea or vomiting. Do not take until 72 hours after chemo 06/10/22   Owens Shark, NP  ondansetron [facility]) 8 MG tablet Take 8 mg by mouth every 8 (eight) hours as needed for nausea/vomiting. 06/10/22   [provider]  pantoprazole (PROTONIX) 20 MG tablet Take 1 tablet (20 mg total) by mouth daily. 06/17/22   Ladell Pier, MD  pantoprazole (PROTONIX) 20 MG tablet Take 20 mg by mouth daily. 06/17/22   [provider]  polyethylene glycol (MIRALAX / GLYCOLAX) 17 g packet Take 17 g by mouth daily.    [provider]  prochlorperazine (COMPAZINE) 10 MG tablet Take 1 tablet (10 mg total) by mouth every 6 (six) hours as needed for nausea or vomiting. 02/27/22   Owens Shark, NP  prochlorperazine (COMPAZINE) 10 MG tablet Take 10 mg by mouth every 6 (six) hours as needed for nausea or vomiting.    [provider]    Physical  Exam: Vitals:   07/02/22 0955 07/02/22 1230  BP: 112/73 (!) 104/91  Pulse: 77 (!) 107  Resp: 16   Temp: 97.7 F (36.5 C)   TempSrc: Oral   SpO2: 99% 99%   General: 60 y.o. female resting in bed in NAD Eyes: PERRL, normal sclera ENMT: Nares patent w/o discharge, orophaynx clear, dentition normal, ears w/o discharge/lesions/ulcers Neck: Supple, trachea midline Cardiovascular: RRR, +S1, S2, no m/g/r, equal pulses throughout Respiratory: CTABL, no w/r/r, normal WOB GI: BS+, ND, epigastric TTP, ostomy in place, no masses noted, no organomegaly noted MSK: No e/c/c Neuro: A&O x 3, no focal deficits Psyc: Appropriate interaction and affect, calm/cooperative  Data Reviewed:  Results for orders placed or performed during the hospital encounter of 07/02/22 (from the past 24  hour(s))  Comprehensive metabolic panel     Status: Abnormal   Collection Time: 07/02/22 10:59 AM  Result Value Ref Range   Sodium 136 135 - 145 mmol/L   Potassium 3.7 3.5 - 5.1 mmol/L   Chloride 102 98 - 111 mmol/L   CO2 25 22 - 32 mmol/L   Glucose, Bld 131 (H) 70 - 99 mg/dL   BUN 6 (L) 8 - 23 mg/dL   Creatinine, Ser 0.49 0.44 - 1.00 mg/dL   Calcium 8.7 (L) 8.9 - 10.3 mg/dL   Total Protein 5.5 (L) 6.5 - 8.1 g/dL   Albumin 3.1 (L) 3.5 - 5.0 g/dL   AST 34 15 - 41 U/L   ALT 38 0 - 44 U/L   Alkaline Phosphatase 265 (H) 38 - 126 U/L   Total Bilirubin 0.8 0.3 - 1.2 mg/dL   GFR, Estimated >60 >60 mL/min   Anion gap 9 5 - 15  CBC with Differential     Status: Abnormal   Collection Time: 07/02/22 10:59 AM  Result Value Ref Range   WBC 2.9 (L) 4.0 - 10.5 K/uL   RBC 3.63 (L) 3.87 - 5.11 MIL/uL   Hemoglobin 11.4 (L) 12.0 - 15.0 g/dL   HCT 34.5 (L) 36.0 - 46.0 %   MCV 95.0 80.0 - 100.0 fL   MCH 31.4 26.0 - 34.0 pg   MCHC 33.0 30.0 - 36.0 g/dL   RDW 16.5 (H) 11.5 - 15.5 %   Platelets 58 (L) 150 - 400 K/uL   nRBC 2.4 (H) 0.0 - 0.2 %   Neutrophils Relative % 44 %   Neutro Abs 1.3 (L) 1.7 - 7.7 K/uL   Lymphocytes  Relative 15 %   Lymphs Abs 0.4 (L) 0.7 - 4.0 K/uL   Monocytes Relative 28 %   Monocytes Absolute 0.8 0.1 - 1.0 K/uL   Eosinophils Relative 0 %   Eosinophils Absolute 0.0 0.0 - 0.5 K/uL   Basophils Relative 1 %   Basophils Absolute 0.0 0.0 - 0.1 K/uL   WBC Morphology DOHLE BODIES    Immature Granulocytes 12 %   Abs Immature Granulocytes 0.34 (H) 0.00 - 0.07 K/uL   Polychromasia PRESENT   Urinalysis, Routine w reflex microscopic Urine, Clean Catch     Status: Abnormal   Collection Time: 07/02/22 10:59 AM  Result Value Ref Range   Color, Urine YELLOW YELLOW   APPearance CLEAR CLEAR   Specific Gravity, Urine 1.018 1.005 - 1.030   pH 6.0 5.0 - 8.0   Glucose, UA NEGATIVE NEGATIVE mg/dL   Hgb urine dipstick NEGATIVE NEGATIVE   Bilirubin Urine NEGATIVE NEGATIVE   Ketones, ur NEGATIVE NEGATIVE mg/dL   Protein, ur 30 (A) NEGATIVE mg/dL   Nitrite NEGATIVE NEGATIVE   Leukocytes,Ua NEGATIVE NEGATIVE   RBC / HPF 0-5 0 - 5 RBC/hpf   WBC, UA 6-10 0 - 5 WBC/hpf   Bacteria, UA RARE (A) NONE SEEN   Squamous Epithelial / LPF 0-5 0 - 5   Mucus PRESENT   Lipase, blood     Status: None   Collection Time: 07/02/22 10:59 AM  Result Value Ref Range   Lipase 20 11 - 51 U/L   CT ab/pelvis 1. Persistent partial mid small bowel obstruction, similar to prior examination of 5 days earlier. As noted before, there is some small-bowel wall thickening at the level of the transition point, but no apparent adjacent mass lesion. 2. Unchanged ascites without evidence of progressive peritoneal disease. 3. Stable  partially treated hepatic and osseous metastatic disease compared with recent prior studies. 4. T12 compression fracture appears new from 02/18/2022, not imaged on more recent abdominal studies.  Assessment and Plan: SBO     - admit to inpt, med-surg     - SBO protocol     - general surgery consulted, appreciate assistance  Hx of rectal cancer on chemo/radiation     - continue outpt follow  up  Pancytopenia     - onco has been following her plts and adjusting her chemo regimen     - her anemia is chronic     - her leukopenia is more recent but expected w/ chemo/radiation; trend for now, if it worsens, speak with onco  GERD     - PPI  HLD     - continue home regimen when confirmed and off NPO status  Advance Care Planning:   Code Status: FULL  Consults: General Surgery  Family Communication: w/ husband at bedside  Severity of Illness: The appropriate patient status for this patient is INPATIENT. Inpatient status is judged to be reasonable and necessary in order to provide the required intensity of service to ensure the patient's safety. The patient's presenting symptoms, physical exam findings, and initial radiographic and laboratory data in the context of their chronic comorbidities is felt to place them at high risk for further clinical deterioration. Furthermore, it is not anticipated that the patient will be medically stable for discharge from the hospital within 2 midnights of admission.   * I certify that at the point of admission it is my clinical judgment that the patient will require inpatient hospital care spanning beyond 2 midnights from the point of admission due to high intensity of service, high risk for further deterioration and high frequency of surveillance required.*  Time spent in coordination of this H&P: 47 minutes  Author: Jonnie Finner, DO 07/02/2022 2:36 PM  For on call review [email or web address].

## 2022-07-02 NOTE — Progress Notes (Signed)
Subjective: CC: Known to our service Was recently discharged 9/11 after SBO resolved.  Was doing well at home: tolerating diet without n/v and having ostomy output. Had emptied her ostomy bag x 2 yesterday before radiation. After radiation yesterday she very shortly after began having severe upper abdominal pain, nausea and vomiting. She has had no output from ostomy since her radiation treatment. Last episode of emesis was a few minutes ago.   Objective: Vital signs in last 24 hours: Temp:  [97.7 F (36.5 C)] 97.7 F (36.5 C) (09/14 0955) Pulse Rate:  [77-107] 107 (09/14 1230) Resp:  [16] 16 (09/14 1230) BP: (104-112)/(73-91) 104/91 (09/14 1230) SpO2:  [99 %] 99 % (09/14 1230)    Intake/Output from previous day: No intake/output data recorded. Intake/Output this shift: No intake/output data recorded.  PE: Gen:  Alert, actively vomiting Card:  Reg Pulm:  Rate and effort normal Abd: Soft, upper abdominal distension with tenderness to palpation but no rigidity or guarding. +BS. No lower abdominal ttp. Stoma viable. Ostomy bag with no output in bag.  Ext:  No LE edema. MAE's Psych: A&Ox3   Lab Results:  Recent Labs    06/30/22 1449 07/02/22 1059  WBC 4.3 2.9*  HGB 9.6* 11.4*  HCT 28.9* 34.5*  PLT 35* 58*   BMET Recent Labs    07/02/22 1059  NA 136  K 3.7  CL 102  CO2 25  GLUCOSE 131*  BUN 6*  CREATININE 0.49  CALCIUM 8.7*   PT/INR No results for input(s): "LABPROT", "INR" in the last 72 hours. CMP     Component Value Date/Time   NA 136 07/02/2022 1059   NA 143 01/26/2022 0830   K 3.7 07/02/2022 1059   CL 102 07/02/2022 1059   CO2 25 07/02/2022 1059   GLUCOSE 131 (H) 07/02/2022 1059   BUN 6 (L) 07/02/2022 1059   BUN 5 (L) 01/26/2022 0830   CREATININE 0.49 07/02/2022 1059   CREATININE 0.48 06/23/2022 0805   CALCIUM 8.7 (L) 07/02/2022 1059   PROT 5.5 (L) 07/02/2022 1059   PROT 6.5 01/26/2022 0830   ALBUMIN 3.1 (L) 07/02/2022 1059   ALBUMIN  4.5 01/26/2022 0830   AST 34 07/02/2022 1059   AST 27 06/23/2022 0805   ALT 38 07/02/2022 1059   ALT 29 06/23/2022 0805   ALKPHOS 265 (H) 07/02/2022 1059   BILITOT 0.8 07/02/2022 1059   BILITOT 0.5 06/23/2022 0805   GFRNONAA >60 07/02/2022 1059   GFRNONAA >60 06/23/2022 0805   GFRAA 92 11/28/2020 0817   Lipase     Component Value Date/Time   LIPASE 20 07/02/2022 1059    Studies/Results: CT ABDOMEN PELVIS W CONTRAST  Result Date: 07/02/2022 CLINICAL DATA:  Abdominal pain, acute nonlocalized. Recent admission for small bowel obstruction. History of metastatic rectal cancer. * Tracking Code: BO * EXAM: CT ABDOMEN AND PELVIS WITH CONTRAST TECHNIQUE: Multidetector CT imaging of the abdomen and pelvis was performed using the standard protocol following bolus administration of intravenous contrast. RADIATION DOSE REDUCTION: This exam was performed according to the departmental dose-optimization program which includes automated exposure control, adjustment of the mA and/or kV according to patient size and/or use of iterative reconstruction technique. CONTRAST:  127m OMNIPAQUE IOHEXOL 300 MG/ML  SOLN COMPARISON:  CT 05/08/2022 and 03/11/2022. FINDINGS: Lower chest: Clear lung bases. No significant pleural or pericardial effusion. Unchanged right pericardiac node measuring 8 mm on image 16/2. Hepatobiliary: Partially treated hepatic metastatic disease is similar to  the recent study with the largest lesions measuring 10 mm superiorly in the right lobe (segment 7, image 14/2) and 10 mm inferiorly in the right lobe (segment 6, image 25/2). There are hepatic cysts as well. No new or enlarging hepatic lesions. No evidence of gallstones, gallbladder wall thickening or biliary dilatation. Pancreas: Unremarkable. No pancreatic ductal dilatation or surrounding inflammatory changes. Spleen: Normal in size without focal abnormality. Adrenals/Urinary Tract: Both adrenal glands appear normal. No evidence of urinary  tract calculus, suspicious renal lesion or hydronephrosis. Stable small renal cysts which do not require any imaging follow-up. The bladder appears unremarkable for its degree of distention. Stomach/Bowel: No enteric contrast administered. The stomach and proximal small bowel appear unremarkable. Multiple moderately dilated loops of small bowel are again noted within the left abdomen, measuring up to 4 cm in diameter. Focal transition point is noted in the periumbilical region with angulated bowel and wall thickening. The distal small bowel is decompressed. Appearance is similar to the recent prior study. The appendix appears normal. There is fluid in the proximal colon which is normal in caliber. The distal transverse colon and proximal descending colon are decompressed proximal to the left-sided loop colostomy. Persistent rectosigmoid circumferential wall thickening. Vascular/Lymphatic: There are no enlarged abdominal lymph nodes. Residual small perirectal lymph nodes are unchanged. No significant vascular findings. Reproductive: Hysterectomy.  No suspicious adnexal findings. Other: Small volume of ascites, similar to recent prior study. No peritoneal or omental nodularity identified. Musculoskeletal: Multiple predominately sclerotic osseous metastases are similar to the recent study. There are compression deformities involving the superior endplates of the Z76, L1 and L3 vertebral bodies. The lumbar compression deformities are stable. T10 was not imaged on the most recent studies, but this fracture appears new compared with a chest CT performed 02/18/2022. IMPRESSION: 1. Persistent partial mid small bowel obstruction, similar to prior examination of 5 days earlier. As noted before, there is some small-bowel wall thickening at the level of the transition point, but no apparent adjacent mass lesion. 2. Unchanged ascites without evidence of progressive peritoneal disease. 3. Stable partially treated hepatic and  osseous metastatic disease compared with recent prior studies. 4. T12 compression fracture appears new from 02/18/2022, not imaged on more recent abdominal studies. Electronically Signed   By: Richardean Sale M.D.   On: 07/02/2022 13:43    Anti-infectives: Anti-infectives (From admission, onward)    None        Assessment/Plan Metastatic rectal cancer with mets to the liver, retroperitoneal lymph nodes, and the lumbar spine who is s/p loop colostomy placed by Dr. Johney Maine in the mid descending colon in May 2023 and is currently undergoing chemo and radiation who presented with upper abdominal pain, n/v and no ostomy output shortly after new radiation treatment yesterday. Her CT scan demonstrates an SBO with transition point at area of small-bowel wall thickening. Question if CT scan findings are 2/2 radiation induced enteritis causing sbo symptoms/findings. Recommend NPO and NGT placement. Will proceed with SBO protocol. We will follow.   FEN - NPO, place NGT. IVF per TRH VTE - SCDs, okay for chemical prophylaxis from a general surgery standpoint ID - None indicated Admit - [facility]  New T12 fx on imaging HLD   LOS: 0 days    Jillyn Ledger , [facility] Surgery 07/02/2022, 3:02 PM Please see Amion for pager number during day hours 7:00am-4:30pm

## 2022-07-02 NOTE — ED Notes (Signed)
ED TO INPATIENT HANDOFF REPORT  ED Nurse Name and Phone #:   S Name/Age/Gender Karen Kaufman 61 y.o. female Room/Bed: WA10/WA10  Code Status   Code Status: Prior  Home/SNF/Othe  Triage Complete: Triage complete  Chief Complaint SBO (small bowel obstruction) ([place]) [K56.609]  Triage Note Pt here from home with c/o upper abd pain after a stronger  radiation treatment yesterday , pt also has a hx of SBO   Allergies Allergies  Allergen Reactions   Iodine Anaphylaxis    NOT CT CONTRAST    Shellfish Allergy Anaphylaxis   Shellfish Allergy Anaphylaxis   Decadron [Dexamethasone] Other (See Comments)    Severe acid reflux   Dilaudid [Hydromorphone] Other (See Comments)    Burning sensation   Hydromorphone     "Burning Sensation"    Level of Care/Admitting Diagnosis ED Disposition     ED Disposition  Admit   Condition  --   [place]: [place] [[id number]]  Level of Care: Med-Surg [16]  May admit patient to [facility] or [facility] if equivalent level of care is available:: No  Covid Evaluation: Asymptomatic - no recent exposure (last 10 days) testing not required  Diagnosis: SBO (small bowel obstruction) [facility]) [[id number]]  Admitting Physician: Jonnie Finner [[id number]]  Attending Physician: Jonnie Finner [[id number]]  Certification:: I certify this patient will need inpatient services for at least 2 midnights  Estimated Length of Stay: 2          B Medical/Surgery History Past Medical History:  Diagnosis Date   Allergy    Cancer ([place])    skin cancer removed by dermatology   Cataract    Colorectal cancer ([place])    Colostomy care ([place])    Hyperlipidemia    Past Surgical History:  Procedure Laterality Date   ABDOMINAL HYSTERECTOMY  2002   BOWEL DECOMPRESSION N/A 02/09/2022   Procedure: BOWEL DECOMPRESSION;  Surgeon: Ladene Artist, MD;  Location: [place];  Service: Gastroenterology;  Laterality: N/A;   [place]   craniotomy-removal of tumor from behind right eye   BREAST SURGERY  2003   breast implants   COLONOSCOPY WITH PROPOFOL N/A 02/09/2022   Procedure: COLONOSCOPY WITH PROPOFOL;  Surgeon: Ladene Artist, MD;  Location: [place];  Service: Gastroenterology;  Laterality: N/A;   EYE SURGERY     bilateral cataract removal   FLEXIBLE SIGMOIDOSCOPY N/A 02/09/2022   Procedure: FLEXIBLE SIGMOIDOSCOPY;  Surgeon: Ladene Artist, MD;  Location: [place];  Service: Gastroenterology;  Laterality: N/A;   ILEO LOOP COLOSTOMY CLOSURE N/A 03/12/2022   Procedure: LAPAROSCOPIC ASSISTED DIVERTING COLOSTOMY;  Surgeon: Michael Boston, MD;  Location: [facility];  Service: General;  Laterality: N/A;   IR IMAGING GUIDED PORT INSERTION  02/20/2022   IR US GUIDE BX ASP/DRAIN  02/20/2022   POLYPECTOMY  02/09/2022   Procedure: POLYPECTOMY;  Surgeon: Ladene Artist, MD;  Location: [facility] ENDOSCOPY;  Service: Gastroenterology;;     A IV Location/Drains/Wounds Patient Lines/Drains/Airways Status     Active Line/Drains/Airways     Name Placement date Placement time Site Days   Implanted Port 02/20/22 Right Chest 02/20/22  1226  Chest  132   Implanted Port 06/26/22 Right Chest 06/26/22  2359  Chest  6   NG/OG Vented/Dual Lumen 16 Fr. Left nare Marking at nare/corner of mouth 07/02/22  1528  Left nare  less than 1   Colostomy LUQ 03/12/22  1222  LUQ  112  Colostomy LUQ --  --  LUQ  --            Intake/Output Last 24 hours No intake or output data in the 24 hours ending 07/02/22 1636  Labs/Imaging Results for orders placed or performed during the hospital encounter of 07/02/22 (from the past 48 hour(s))  Comprehensive metabolic panel     Status: Abnormal   Collection Time: 07/02/22 10:59 AM  Result Value Ref Range   Sodium 136 135 - 145 mmol/L   Potassium 3.7 3.5 - 5.1 mmol/L   Chloride 102 98 - 111 mmol/L   CO2 25 22 - 32 mmol/L   Glucose, Bld 131 (H) 70 - 99 mg/dL    Comment: Glucose reference  range applies only to samples taken after fasting for at least 8 hours.   BUN 6 (L) 8 - 23 mg/dL   Creatinine, Ser 0.49 0.44 - 1.00 mg/dL   Calcium 8.7 (L) 8.9 - 10.3 mg/dL   Total Protein 5.5 (L) 6.5 - 8.1 g/dL   Albumin 3.1 (L) 3.5 - 5.0 g/dL   AST 34 15 - 41 U/L   ALT 38 0 - 44 U/L   Alkaline Phosphatase 265 (H) 38 - 126 U/L   Total Bilirubin 0.8 0.3 - 1.2 mg/dL   GFR, Estimated >60 >60 mL/min    Comment: (NOTE) Calculated using the CKD-EPI Creatinine Equation (2021)    Anion gap 9 5 - 15    Comment: Performed at [facility], [place] [address], [place], [place] [address]  CBC with Differential     Status: Abnormal   Collection Time: 07/02/22 10:59 AM  Result Value Ref Range   WBC 2.9 (L) 4.0 - 10.5 K/uL   RBC 3.63 (L) 3.87 - 5.11 MIL/uL   Hemoglobin 11.4 (L) 12.0 - 15.0 g/dL   HCT 34.5 (L) 36.0 - 46.0 %   MCV 95.0 80.0 - 100.0 fL   MCH 31.4 26.0 - 34.0 pg   MCHC 33.0 30.0 - 36.0 g/dL   RDW 16.5 (H) 11.5 - 15.5 %   Platelets 58 (L) 150 - 400 K/uL    Comment: SPECIMEN CHECKED FOR CLOTS Immature Platelet Fraction may be clinically indicated, consider ordering this additional test [id number] REPEATED TO VERIFY PLATELET COUNT CONFIRMED BY SMEAR    nRBC 2.4 (H) 0.0 - 0.2 %   Neutrophils Relative % 44 %   Neutro Abs 1.3 (L) 1.7 - 7.7 K/uL   Lymphocytes Relative 15 %   Lymphs Abs 0.4 (L) 0.7 - 4.0 K/uL   Monocytes Relative 28 %   Monocytes Absolute 0.8 0.1 - 1.0 K/uL   Eosinophils Relative 0 %   Eosinophils Absolute 0.0 0.0 - 0.5 K/uL   Basophils Relative 1 %   Basophils Absolute 0.0 0.0 - 0.1 K/uL   WBC Morphology DOHLE BODIES     Comment: MILD LEFT SHIFT (1-5% METAS, OCC MYELO, OCC BANDS)   Immature Granulocytes 12 %   Abs Immature Granulocytes 0.34 (H) 0.00 - 0.07 K/uL   Polychromasia PRESENT     Comment: Performed at [facility], [place] [address], [place], [place] [address]  Urinalysis, Routine w reflex microscopic Urine, Clean  Catch     Status: Abnormal   Collection Time: 07/02/22 10:59 AM  Result Value Ref Range   Color, Urine YELLOW YELLOW   APPearance CLEAR CLEAR   Specific Gravity, Urine 1.018 1.005 - 1.030   pH 6.0 5.0 - 8.0   Glucose, UA  NEGATIVE NEGATIVE mg/dL   Hgb urine dipstick NEGATIVE NEGATIVE   Bilirubin Urine NEGATIVE NEGATIVE   Ketones, ur NEGATIVE NEGATIVE mg/dL   Protein, ur 30 (A) NEGATIVE mg/dL   Nitrite NEGATIVE NEGATIVE   Leukocytes,Ua NEGATIVE NEGATIVE   RBC / HPF 0-5 0 - 5 RBC/hpf   WBC, UA 6-10 0 - 5 WBC/hpf   Bacteria, UA RARE (A) NONE SEEN   Squamous Epithelial / LPF 0-5 0 - 5   Mucus PRESENT     Comment: Performed at [facility], [place] [address], [place], [place] [address]  Lipase, blood     Status: None   Collection Time: 07/02/22 10:59 AM  Result Value Ref Range   Lipase 20 11 - 51 U/L    Comment: Performed at [facility], [place] [address], [place], [place] [address]   DG Abd Portable 1V-Small Bowel Protocol-Position Verification  Result Date: 07/02/2022 CLINICAL DATA:  NG tube placement. EXAM: PORTABLE ABDOMEN - 1 VIEW COMPARISON:  June 27, 2022 FINDINGS: The bowel gas pattern is normal. No radio-opaque calculi or other significant radiographic abnormality are seen. Enteric catheter overlies the expected location of gastric body. IMPRESSION: Enteric catheter overlies the expected location of gastric body. Electronically Signed   By: Fidela Salisbury M.D.   On: 07/02/2022 16:10   CT ABDOMEN PELVIS W CONTRAST  Result Date: 07/02/2022 CLINICAL DATA:  Abdominal pain, acute nonlocalized. Recent admission for small bowel obstruction. History of metastatic rectal cancer. * Tracking Code: BO * EXAM: CT ABDOMEN AND PELVIS WITH CONTRAST TECHNIQUE: Multidetector CT imaging of the abdomen and pelvis was performed using the standard protocol following bolus administration of intravenous contrast. RADIATION DOSE REDUCTION: This exam was performed  according to the departmental dose-optimization program which includes automated exposure control, adjustment of the mA and/or kV according to patient size and/or use of iterative reconstruction technique. CONTRAST:  115m OMNIPAQUE IOHEXOL 300 MG/ML  SOLN COMPARISON:  CT 05/08/2022 and 03/11/2022. FINDINGS: Lower chest: Clear lung bases. No significant pleural or pericardial effusion. Unchanged right pericardiac node measuring 8 mm on image 16/2. Hepatobiliary: Partially treated hepatic metastatic disease is similar to the recent study with the largest lesions measuring 10 mm superiorly in the right lobe (segment 7, image 14/2) and 10 mm inferiorly in the right lobe (segment 6, image 25/2). There are hepatic cysts as well. No new or enlarging hepatic lesions. No evidence of gallstones, gallbladder wall thickening or biliary dilatation. Pancreas: Unremarkable. No pancreatic ductal dilatation or surrounding inflammatory changes. Spleen: Normal in size without focal abnormality. Adrenals/Urinary Tract: Both adrenal glands appear normal. No evidence of urinary tract calculus, suspicious renal lesion or hydronephrosis. Stable small renal cysts which do not require any imaging follow-up. The bladder appears unremarkable for its degree of distention. Stomach/Bowel: No enteric contrast administered. The stomach and proximal small bowel appear unremarkable. Multiple moderately dilated loops of small bowel are again noted within the left abdomen, measuring up to 4 cm in diameter. Focal transition point is noted in the periumbilical region with angulated bowel and wall thickening. The distal small bowel is decompressed. Appearance is similar to the recent prior study. The appendix appears normal. There is fluid in the proximal colon which is normal in caliber. The distal transverse colon and proximal descending colon are decompressed proximal to the left-sided loop colostomy. Persistent rectosigmoid circumferential wall  thickening. Vascular/Lymphatic: There are no enlarged abdominal lymph nodes. Residual small perirectal lymph nodes are unchanged. No significant vascular findings. Reproductive: Hysterectomy.  No suspicious adnexal findings. Other: Small volume of ascites, similar to recent prior study. No peritoneal or omental nodularity identified. Musculoskeletal: Multiple predominately sclerotic osseous metastases are similar to the recent study. There are compression deformities involving the superior endplates of the D14, L1 and L3 vertebral bodies. The lumbar compression deformities are stable. T10 was not imaged on the most recent studies, but this fracture appears new compared with a chest CT performed 02/18/2022. IMPRESSION: 1. Persistent partial mid small bowel obstruction, similar to prior examination of 5 days earlier. As noted before, there is some small-bowel wall thickening at the level of the transition point, but no apparent adjacent mass lesion. 2. Unchanged ascites without evidence of progressive peritoneal disease. 3. Stable partially treated hepatic and osseous metastatic disease compared with recent prior studies. 4. T12 compression fracture appears new from 02/18/2022, not imaged on more recent abdominal studies. Electronically Signed   By: Richardean Sale M.D.   On: 07/02/2022 13:43    Pending Labs [employer] (From admission, onward)     Start     Ordered   Signed and Held  Comprehensive metabolic panel  Tomorrow morning,   R        Signed and Held   Signed and Held  CBC  Tomorrow morning,   R        Signed and Held            Vitals/Pain Today's Vitals   07/02/22 0955 07/02/22 1024 07/02/22 1230 07/02/22 1539  BP: 112/73  (!) 104/91 105/70  Pulse: 77  (!) 107 78  Resp: '16  16 19  '$ Temp: 97.7 F (36.5 C)   97.9 F (36.6 C)  TempSrc: Oral   Oral  SpO2: 99%  99% 99%  PainSc:  8       Isolation Precautions No active isolations  Medications Medications  sodium chloride  flush (NS) 0.9 % injection 10-40 mL (has no administration in time range)  sodium chloride flush (NS) 0.9 % injection 10-40 mL (has no administration in time range)  Chlorhexidine Gluconate Cloth 2 % PADS 6 each (has no administration in time range)  diatrizoate meglumine-sodium (GASTROGRAFIN) 66-10 % solution 90 mL (has no administration in time range)  0.9 %  sodium chloride infusion (has no administration in time range)  fentaNYL (SUBLIMAZE) injection 100 mcg (100 mcg Intravenous Given 07/02/22 1236)  sodium chloride 0.9 % bolus 1,000 mL (1,000 mLs Intravenous New Bag/Given 07/02/22 1236)  iohexol (OMNIPAQUE) 300 MG/ML solution 100 mL (100 mLs Intravenous Contrast Given 07/02/22 1254)  fentaNYL (SUBLIMAZE) injection 100 mcg (100 mcg Intravenous Given 07/02/22 1444)  ondansetron (ZOFRAN) injection 4 mg (4 mg Intravenous Given 07/02/22 1445)    Mobility  Low fall risk   Focused Assessments    R Recommendations: See Admitting Provider Note  Report given to:   Additional Notes:

## 2022-07-02 NOTE — ED Provider Notes (Signed)
[facility] DEPT Provider Note   CSN: [id number] Arrival date & time: 07/02/22  0940     History  No chief complaint on file.   Karen Kaufman is a 61 y.o. female.  HPI 61 year old female presents with severe abdominal pain.  She had radiation yesterday afternoon and premed right after developed upper abdominal pain.  It spasms and become severe at times though never fully goes away.  She vomited yesterday afternoon.  Since emptying her colostomy prior to radiation at around 2 PM she has not had any further colostomy output.  She denies fevers, chest pain, back pain or urinary symptoms.  She states that she had 1 course of radiation but they are now restarting at higher strength.  She is also on chemotherapy.  Home Medications Prior to Admission medications   Medication Sig Start Date End Date Taking? Authorizing Provider  atorvastatin (LIPITOR) 20 MG tablet Take 1 tablet (20 mg total) by mouth daily. 01/26/22   Gwenlyn Perking, FNP  atorvastatin (LIPITOR) 20 MG tablet Take 20 mg by mouth daily. 05/03/22   [provider]  famotidine (PEPCID) 20 MG tablet Take 1 tablet (20 mg total) by mouth 2 (two) times daily. 06/21/22   Nicholas Lose, MD  famotidine (PEPCID) 20 MG tablet Take 20 mg by mouth 2 (two) times daily as needed for heartburn. 06/22/22   [provider]  HYDROcodone-acetaminophen (NORCO) 5-325 MG tablet Take 1-2 tablets by mouth every 4 (four) hours as needed for moderate pain. 06/10/22   Owens Shark, NP  HYDROcodone-acetaminophen (NORCO/VICODIN) 5-325 MG tablet Take 1 tablet by mouth every 4 (four) hours as needed for pain. 06/10/22   [provider]  lidocaine-prilocaine (EMLA) cream Apply to port site 1-2 hours prior to use 02/27/22   Owens Shark, NP  lidocaine-prilocaine (EMLA) cream Apply 1 Application topically as needed (port access).    [provider]  magnesium oxide (MAG-OX) 400 (240 Mg) MG tablet  Take 400 mg by mouth daily.    [provider]  magnesium oxide (MAG-OX) 400 MG tablet Take 400 mg by mouth daily.    [provider]  ondansetron (ZOFRAN) 8 MG tablet Take 1 tablet (8 mg total) by mouth every 8 (eight) hours as needed for nausea or vomiting. Do not take until 72 hours after chemo 06/10/22   Owens Shark, NP  ondansetron [facility]) 8 MG tablet Take 8 mg by mouth every 8 (eight) hours as needed for nausea/vomiting. 06/10/22   [provider]  pantoprazole (PROTONIX) 20 MG tablet Take 1 tablet (20 mg total) by mouth daily. 06/17/22   Ladell Pier, MD  pantoprazole (PROTONIX) 20 MG tablet Take 20 mg by mouth daily. 06/17/22   [provider]  polyethylene glycol (MIRALAX / GLYCOLAX) 17 g packet Take 17 g by mouth daily.    [provider]  prochlorperazine (COMPAZINE) 10 MG tablet Take 1 tablet (10 mg total) by mouth every 6 (six) hours as needed for nausea or vomiting. 02/27/22   Owens Shark, NP  prochlorperazine (COMPAZINE) 10 MG tablet Take 10 mg by mouth every 6 (six) hours as needed for nausea or vomiting.    [provider]      Allergies    Iodine, Shellfish allergy, Shellfish allergy, Decadron [dexamethasone], Dilaudid [hydromorphone], and Hydromorphone    Review of Systems   Review of Systems  Constitutional:  Negative for fever.  Cardiovascular:  Negative for chest pain.  Gastrointestinal:  Positive for abdominal pain, constipation and vomiting. Negative for diarrhea.  Genitourinary:  Negative for dysuria.  Musculoskeletal:  Negative for back pain.    Physical Exam Updated Vital Signs BP 105/70 (BP Location: Left Arm)   Pulse 78   Temp 97.9 F (36.6 C) (Oral)   Resp 19   SpO2 99%  Physical Exam Vitals and nursing note reviewed.  Constitutional:      Appearance: She is well-developed. She is not ill-appearing or diaphoretic.  HENT:     Head: Normocephalic and atraumatic.  Cardiovascular:     Rate and  Rhythm: Normal rate and regular rhythm.     Heart sounds: Normal heart sounds.  Pulmonary:     Effort: Pulmonary effort is normal.     Breath sounds: Normal breath sounds.  Abdominal:     Palpations: Abdomen is soft.     Tenderness: There is generalized abdominal tenderness.     Comments: Colostomy without any stool in bag. No gross blood.  Skin:    General: Skin is warm and dry.  Neurological:     Mental Status: She is alert.     ED Results / Procedures / Treatments   Labs (all labs ordered are listed, but only abnormal results are displayed) Labs Reviewed  COMPREHENSIVE METABOLIC PANEL - Abnormal; Notable for the following components:      Result Value   Glucose, Bld 131 (*)    BUN 6 (*)    Calcium 8.7 (*)    Total Protein 5.5 (*)    Albumin 3.1 (*)    Alkaline Phosphatase 265 (*)    All other components within normal limits  CBC WITH DIFFERENTIAL/PLATELET - Abnormal; Notable for the following components:   WBC 2.9 (*)    RBC 3.63 (*)    Hemoglobin 11.4 (*)    HCT 34.5 (*)    RDW 16.5 (*)    Platelets 58 (*)    nRBC 2.4 (*)    Neutro Abs 1.3 (*)    Lymphs Abs 0.4 (*)    Abs Immature Granulocytes 0.34 (*)    All other components within normal limits  URINALYSIS, ROUTINE W REFLEX MICROSCOPIC - Abnormal; Notable for the following components:   Protein, ur 30 (*)    Bacteria, UA RARE (*)    All other components within normal limits  LIPASE, BLOOD    EKG None  Radiology DG Abd Portable 1V-Small Bowel Protocol-Position Verification  Result Date: 07/02/2022 CLINICAL DATA:  NG tube placement. EXAM: PORTABLE ABDOMEN - 1 VIEW COMPARISON:  June 27, 2022 FINDINGS: The bowel gas pattern is normal. No radio-opaque calculi or other significant radiographic abnormality are seen. Enteric catheter overlies the expected location of gastric body. IMPRESSION: Enteric catheter overlies the expected location of gastric body. Electronically Signed   By: Fidela Salisbury M.D.    On: 07/02/2022 16:10   CT ABDOMEN PELVIS W CONTRAST  Result Date: 07/02/2022 CLINICAL DATA:  Abdominal pain, acute nonlocalized. Recent admission for small bowel obstruction. History of metastatic rectal cancer. * Tracking Code: BO * EXAM: CT ABDOMEN AND PELVIS WITH CONTRAST TECHNIQUE: Multidetector CT imaging of the abdomen and pelvis was performed using the standard protocol following bolus administration of intravenous contrast. RADIATION DOSE REDUCTION: This exam was performed according to the departmental dose-optimization program which includes automated exposure control, adjustment of the mA and/or kV according to patient size and/or use of iterative reconstruction technique. CONTRAST:  164m OMNIPAQUE IOHEXOL 300 MG/ML  SOLN COMPARISON:  CT  05/08/2022 and 03/11/2022. FINDINGS: Lower chest: Clear lung bases. No significant pleural or pericardial effusion. Unchanged right pericardiac node measuring 8 mm on image 16/2. Hepatobiliary: Partially treated hepatic metastatic disease is similar to the recent study with the largest lesions measuring 10 mm superiorly in the right lobe (segment 7, image 14/2) and 10 mm inferiorly in the right lobe (segment 6, image 25/2). There are hepatic cysts as well. No new or enlarging hepatic lesions. No evidence of gallstones, gallbladder wall thickening or biliary dilatation. Pancreas: Unremarkable. No pancreatic ductal dilatation or surrounding inflammatory changes. Spleen: Normal in size without focal abnormality. Adrenals/Urinary Tract: Both adrenal glands appear normal. No evidence of urinary tract calculus, suspicious renal lesion or hydronephrosis. Stable small renal cysts which do not require any imaging follow-up. The bladder appears unremarkable for its degree of distention. Stomach/Bowel: No enteric contrast administered. The stomach and proximal small bowel appear unremarkable. Multiple moderately dilated loops of small bowel are again noted within the left  abdomen, measuring up to 4 cm in diameter. Focal transition point is noted in the periumbilical region with angulated bowel and wall thickening. The distal small bowel is decompressed. Appearance is similar to the recent prior study. The appendix appears normal. There is fluid in the proximal colon which is normal in caliber. The distal transverse colon and proximal descending colon are decompressed proximal to the left-sided loop colostomy. Persistent rectosigmoid circumferential wall thickening. Vascular/Lymphatic: There are no enlarged abdominal lymph nodes. Residual small perirectal lymph nodes are unchanged. No significant vascular findings. Reproductive: Hysterectomy.  No suspicious adnexal findings. Other: Small volume of ascites, similar to recent prior study. No peritoneal or omental nodularity identified. Musculoskeletal: Multiple predominately sclerotic osseous metastases are similar to the recent study. There are compression deformities involving the superior endplates of the W29, L1 and L3 vertebral bodies. The lumbar compression deformities are stable. T10 was not imaged on the most recent studies, but this fracture appears new compared with a chest CT performed 02/18/2022. IMPRESSION: 1. Persistent partial mid small bowel obstruction, similar to prior examination of 5 days earlier. As noted before, there is some small-bowel wall thickening at the level of the transition point, but no apparent adjacent mass lesion. 2. Unchanged ascites without evidence of progressive peritoneal disease. 3. Stable partially treated hepatic and osseous metastatic disease compared with recent prior studies. 4. T12 compression fracture appears new from 02/18/2022, not imaged on more recent abdominal studies. Electronically Signed   By: Richardean Sale M.D.   On: 07/02/2022 13:43    Procedures Procedures    Medications Ordered in ED Medications  sodium chloride flush (NS) 0.9 % injection 10-40 mL (has no  administration in time range)  sodium chloride flush (NS) 0.9 % injection 10-40 mL (has no administration in time range)  Chlorhexidine Gluconate Cloth 2 % PADS 6 each (has no administration in time range)  diatrizoate meglumine-sodium (GASTROGRAFIN) 66-10 % solution 90 mL (has no administration in time range)  0.9 %  sodium chloride infusion (has no administration in time range)  fentaNYL (SUBLIMAZE) injection 100 mcg (100 mcg Intravenous Given 07/02/22 1236)  sodium chloride 0.9 % bolus 1,000 mL (1,000 mLs Intravenous New Bag/Given 07/02/22 1236)  iohexol (OMNIPAQUE) 300 MG/ML solution 100 mL (100 mLs Intravenous Contrast Given 07/02/22 1254)  fentaNYL (SUBLIMAZE) injection 100 mcg (100 mcg Intravenous Given 07/02/22 1444)  ondansetron (ZOFRAN) injection 4 mg (4 mg Intravenous Given 07/02/22 1445)    ED Course/ Medical Decision Making/ A&P  Medical Decision Making Amount and/or Complexity of Data Reviewed Labs:     Details: Normal renal function.  Chronic anemia Radiology: independent interpretation performed.    Details: Small bowel obstruction on CT  Risk OTC drugs. Prescription drug management. Decision regarding hospitalization.   It is unclear if this pain is related to the radiation from yesterday given the time onset.  However she also has not had anything come out of her colostomy in 24 hours which supports small bowel obstruction.  She was given IV fluids and IV pain control and has required multiple doses of fentanyl.  Discussed with general surgery who has consulted and thinks it might be radiation-induced enteritis.  They still feel NG is warranted and we will also follow.  Discussed with hospitalist, Dr. Marylyn Ishihara for admission.        Final Clinical Impression(s) / ED Diagnoses Final diagnoses:  Small bowel obstruction [facility])    Rx / DC Orders ED Discharge Orders     None         Sherwood Gambler, MD 07/02/22 1630

## 2022-07-02 NOTE — ED Triage Notes (Signed)
Pt here from home with c/o upper abd pain after a stronger  radiation treatment yesterday , pt also has a hx of SBO

## 2022-07-03 ENCOUNTER — Inpatient Hospital Stay (HOSPITAL_COMMUNITY): Payer: BC Managed Care – PPO

## 2022-07-03 ENCOUNTER — Ambulatory Visit: Payer: BC Managed Care – PPO | Admitting: Radiation Oncology

## 2022-07-03 ENCOUNTER — Inpatient Hospital Stay: Payer: BC Managed Care – PPO

## 2022-07-03 ENCOUNTER — Ambulatory Visit: Payer: BC Managed Care – PPO

## 2022-07-03 DIAGNOSIS — K56609 Unspecified intestinal obstruction, unspecified as to partial versus complete obstruction: Secondary | ICD-10-CM | POA: Diagnosis not present

## 2022-07-03 LAB — COMPREHENSIVE METABOLIC PANEL
ALT: 26 U/L (ref 0–44)
AST: 22 U/L (ref 15–41)
Albumin: 2.2 g/dL — ABNORMAL LOW (ref 3.5–5.0)
Alkaline Phosphatase: 202 U/L — ABNORMAL HIGH (ref 38–126)
Anion gap: 5 (ref 5–15)
BUN: 7 mg/dL — ABNORMAL LOW (ref 8–23)
CO2: 26 mmol/L (ref 22–32)
Calcium: 7.4 mg/dL — ABNORMAL LOW (ref 8.9–10.3)
Chloride: 109 mmol/L (ref 98–111)
Creatinine, Ser: 0.34 mg/dL — ABNORMAL LOW (ref 0.44–1.00)
GFR, Estimated: >60 mL/min (ref >60)
Glucose, Bld: 82 mg/dL (ref 70–99)
Potassium: 3.2 mmol/L — ABNORMAL LOW (ref 3.5–5.1)
Sodium: 140 mmol/L (ref 135–145)
Total Bilirubin: 0.6 mg/dL (ref 0.3–1.2)
Total Protein: 4.2 g/dL — ABNORMAL LOW (ref 6.5–8.1)

## 2022-07-03 LAB — CBC
HCT: 26 % — ABNORMAL LOW (ref 36.0–46.0)
Hemoglobin: 8.4 g/dL — ABNORMAL LOW (ref 12.0–15.0)
MCH: 31.5 pg (ref 26.0–34.0)
MCHC: 32.3 g/dL (ref 30.0–36.0)
MCV: 97.4 fL (ref 80.0–100.0)
Platelets: 49 10*3/uL — ABNORMAL LOW (ref 150–400)
RBC: 2.67 MIL/uL — ABNORMAL LOW (ref 3.87–5.11)
RDW: 17.1 % — ABNORMAL HIGH (ref 11.5–15.5)
WBC: 1.8 10*3/uL — ABNORMAL LOW (ref 4.0–10.5)
nRBC: 4.4 % — ABNORMAL HIGH (ref 0.0–0.2)

## 2022-07-03 MED ORDER — PHENOL 1.4 % MT LIQD
1.0000 | OROMUCOSAL | Status: DC | PRN
  Filled 2022-07-03: qty 177

## 2022-07-03 MED ORDER — POTASSIUM CHLORIDE 10 MEQ/100ML IV SOLN
10.0000 meq | INTRAVENOUS | Status: AC
  Administered 2022-07-03 (×3): 10 meq via INTRAVENOUS
  Filled 2022-07-03: qty 100

## 2022-07-03 NOTE — Discharge Planning (Signed)
Oncology Discharge Planning Admission Note  [facility], [facility], [facility] Address: [address], [place], [place] [address] Hours of Operation:  8am - 5pm, Monday - Friday  Clinic Contact Information:  [phone]  Oncology Care Team: Medical Oncologist:  Dr. Donneta Romberg  Contacted Karen Kaufman to inform that the oncology provider Karen Kaufman/Karen Kaufman are aware of this hospital admission dated 07/02/2022, and the cancer center will follow Karen Kaufman's inpatient care to assist with discharge planning as indicated by the oncologist.  We will reach out to you closer to discharge date to arrange your follow up care.  Disclaimer:  This [place] note does not imply a formal consult request has been made by the admitting attending for this admission or there will be an inpatient consult completed by oncology.  Please request oncology consults as per standard process as indicated.

## 2022-07-03 NOTE — Plan of Care (Signed)
  Problem: Health Behavior/Discharge Planning: Goal: Ability to manage health-related needs will improve Outcome: Progressing   

## 2022-07-03 NOTE — Progress Notes (Signed)
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  Radiation enteritis-NG tube recommended Also pancytopenic WBC 2.9 platelets 58 ANC 1.3  General surgery consulted recommending small bowel protocol  Hospital-Problem based course  Recurrent symptomatic SBO Defer to general surgery seems to have followed the small bowel Gastrografin protocol well and has improved-they are doing clamping trials today-pain seems controlled-morphine 4 mg every 4 as needed pain Keep on saline 50 cc/H  Moderate pancytopenia in the setting of advanced rectosigmoid CA on chemo Probably related to platinum based chemo Check CBC differential in a.m. and reassess  Mild hypokalemia Give 3 rounds of potassium today for K of 3.1 recheck in a.m. with mag  Resume cholesterol and other meds on discharge  DVT prophylaxis: SCD Code Status: Full Family Communication: Discussed with husband at the bedside Disposition:  Status is: Inpatient Remains inpatient appropriate because:   Awaiting resolution of bowel function   Consultants:  General surgery  Procedures: None  Antimicrobials: No   Subjective: Awake coherent no distress EOMI NCAT She thinks her pain is improved she has no nausea right now  Objective: Vitals:   07/02/22 1722 07/02/22 2139 07/03/22 0137 07/03/22 0546  BP: 112/69 119/70 111/64 114/67  Pulse: 89 89 (!) 101 (!) 106  Resp: '16 18 18 18  '$ Temp: 98.7 F (37.1  C) 97.7 F (36.5 C) 98.2 F (36.8 C) 98.7 F (37.1 C)  TempSrc: Oral Oral Oral Oral  SpO2: 98% 98% 97% 96%    Intake/Output Summary (Last 24 hours) at 07/03/2022 0731 Last data filed at 07/03/2022 0612 Gross per 24 hour  Intake 940.09 ml  Output 1375 ml  Net -434.91 ml   There were no vitals filed for this visit.  Examination:  EOMI NCAT no focal deficit CTA B no added sound rales rhonchi No wheeze Trace lower extremity edema Neurologically intact-patient coherent  Data Reviewed: personally reviewed   CBC    Component Value Date/Time   WBC 1.8 (L) 07/03/2022 0434   RBC 2.67 (L) 07/03/2022 0434   HGB 8.4 (L) 07/03/2022 0434   HGB 9.6 (L) 06/30/2022 1449   HGB 13.9 01/26/2022 0830   HCT 26.0 (L) 07/03/2022 0434   HCT 41.8 01/26/2022 0830   PLT 49 (L) 07/03/2022 0434   PLT 35 (L) 06/30/2022 1449   PLT 266 01/26/2022 0830   MCV 97.4 07/03/2022 0434   MCV 90 01/26/2022 0830   MCH 31.5 07/03/2022 0434   MCHC 32.3 07/03/2022 0434   RDW 17.1 (H) 07/03/2022 0434   RDW 12.3 01/26/2022 0830   LYMPHSABS 0.4 (L) 07/02/2022 1059   LYMPHSABS 1.8 01/26/2022 0830   MONOABS 0.8 07/02/2022 1059   EOSABS 0.0 07/02/2022 1059   EOSABS 0.3 01/26/2022 0830   BASOSABS 0.0 07/02/2022 1059   BASOSABS 0.1 01/26/2022 0830      Latest Ref Rng & Units 07/03/2022    4:34 AM 07/02/2022   10:59 AM 06/29/2022    3:54 AM  CMP  Glucose 70 - 99 mg/dL  82  131  85   BUN 8 - 23 mg/dL 7  6  <5   Creatinine 0.44 - 1.00 mg/dL 0.34  0.49  0.36   Sodium 135 - 145 mmol/L 140  136  139   Potassium 3.5 - 5.1 mmol/L 3.2  3.7  3.3   Chloride 98 - 111 mmol/L 109  102  109   CO2 22 - 32 mmol/L '26  25  26   '$ Calcium 8.9 - 10.3 mg/dL 7.4  8.7  7.6   Total Protein 6.5 - 8.1 g/dL 4.2  5.5    Total Bilirubin 0.3 - 1.2 mg/dL 0.6  0.8    Alkaline Phos 38 - 126 U/L 202  265    AST 15 - 41 U/L 22  34    ALT 0 - 44 U/L 26  38       Radiology Studies: DG Abd Portable 1V-Small Bowel Obstruction  Protocol-initial, 8 hr delay  Result Date: 07/03/2022 CLINICAL DATA:  Small-bowel protocol, 8 hours post contrast film. EXAM: PORTABLE ABDOMEN - 1 VIEW COMPARISON:  07/02/2022. FINDINGS: Distended loops of small bowel are present in the mid left abdomen measuring up to 3.4 cm. An enteric tube is noted in the stomach. Contrast is noted in the small and large bowel. No radio-opaque calculi or other significant radiographic abnormality are seen. IMPRESSION: Mildly distended loops of small bowel in the mid left abdomen measuring up to 3.4 cm, improved from the prior exam. Contrast is present in the small bowel and colon suggesting ileus versus partial obstruction. Electronically Signed   By: Brett Fairy M.D.   On: 07/03/2022 02:47   DG Abd Portable 1V-Small Bowel Protocol-Position Verification  Result Date: 07/02/2022 CLINICAL DATA:  NG tube placement. EXAM: PORTABLE ABDOMEN - 1 VIEW COMPARISON:  June 27, 2022 FINDINGS: The bowel gas pattern is normal. No radio-opaque calculi or other significant radiographic abnormality are seen. Enteric catheter overlies the expected location of gastric body. IMPRESSION: Enteric catheter overlies the expected location of gastric body. Electronically Signed   By: Fidela Salisbury M.D.   On: 07/02/2022 16:10   CT ABDOMEN PELVIS W CONTRAST  Result Date: 07/02/2022 CLINICAL DATA:  Abdominal pain, acute nonlocalized. Recent admission for small bowel obstruction. History of metastatic rectal cancer. * Tracking Code: BO * EXAM: CT ABDOMEN AND PELVIS WITH CONTRAST TECHNIQUE: Multidetector CT imaging of the abdomen and pelvis was performed using the standard protocol following bolus administration of intravenous contrast. RADIATION DOSE REDUCTION: This exam was performed according to the departmental dose-optimization program which includes automated exposure control, adjustment of the mA and/or kV according to patient size and/or use of iterative reconstruction technique.  CONTRAST:  138m OMNIPAQUE IOHEXOL 300 MG/ML  SOLN COMPARISON:  CT 05/08/2022 and 03/11/2022. FINDINGS: Lower chest: Clear lung bases. No significant pleural or pericardial effusion. Unchanged right pericardiac node measuring 8 mm on image 16/2. Hepatobiliary: Partially treated hepatic metastatic disease is similar to the recent study with the largest lesions measuring 10 mm superiorly in the right lobe (segment 7, image 14/2) and 10 mm inferiorly in the right lobe (segment 6, image 25/2). There are hepatic cysts as well. No new or enlarging hepatic lesions. No evidence of gallstones, gallbladder wall thickening or biliary dilatation. Pancreas: Unremarkable. No pancreatic ductal dilatation or surrounding inflammatory changes. Spleen: Normal in size without focal abnormality. Adrenals/Urinary Tract: Both adrenal glands appear normal. No evidence of urinary tract calculus, suspicious renal lesion or hydronephrosis. Stable small renal cysts which do  not require any imaging follow-up. The bladder appears unremarkable for its degree of distention. Stomach/Bowel: No enteric contrast administered. The stomach and proximal small bowel appear unremarkable. Multiple moderately dilated loops of small bowel are again noted within the left abdomen, measuring up to 4 cm in diameter. Focal transition point is noted in the periumbilical region with angulated bowel and wall thickening. The distal small bowel is decompressed. Appearance is similar to the recent prior study. The appendix appears normal. There is fluid in the proximal colon which is normal in caliber. The distal transverse colon and proximal descending colon are decompressed proximal to the left-sided loop colostomy. Persistent rectosigmoid circumferential wall thickening. Vascular/Lymphatic: There are no enlarged abdominal lymph nodes. Residual small perirectal lymph nodes are unchanged. No significant vascular findings. Reproductive: Hysterectomy.  No suspicious  adnexal findings. Other: Small volume of ascites, similar to recent prior study. No peritoneal or omental nodularity identified. Musculoskeletal: Multiple predominately sclerotic osseous metastases are similar to the recent study. There are compression deformities involving the superior endplates of the T55, L1 and L3 vertebral bodies. The lumbar compression deformities are stable. T10 was not imaged on the most recent studies, but this fracture appears new compared with a chest CT performed 02/18/2022. IMPRESSION: 1. Persistent partial mid small bowel obstruction, similar to prior examination of 5 days earlier. As noted before, there is some small-bowel wall thickening at the level of the transition point, but no apparent adjacent mass lesion. 2. Unchanged ascites without evidence of progressive peritoneal disease. 3. Stable partially treated hepatic and osseous metastatic disease compared with recent prior studies. 4. T12 compression fracture appears new from 02/18/2022, not imaged on more recent abdominal studies. Electronically Signed   By: Richardean Sale M.D.   On: 07/02/2022 13:43     Scheduled Meds:  Chlorhexidine Gluconate Cloth  6 each Topical Daily   pantoprazole (PROTONIX) IV  40 mg Intravenous Q24H   sodium chloride flush  10-40 mL Intracatheter Q12H   Continuous Infusions:  sodium chloride 75 mL/hr at 07/03/22 0612     LOS: 1 day   Time spent: [place], MD [facility] To contact the attending provider between 7A-7P or the covering provider during after hours 7P-7A, please log into the web site [email or web address] and access using universal [place] password for that web site. If you do not have the password, please call the hospital operator.  07/03/2022, 7:31 AM

## 2022-07-03 NOTE — Progress Notes (Signed)
Subjective: CC: Known to our service Was recently discharged 9/11 after SBO resolved.  Ostomy has opened up. NG output less bilious.     Objective: Vital signs in last 24 hours: Temp:  [97.7 F (36.5 C)-98.7 F (37.1 C)] 98.3 F (36.8 C) (09/15 0910) Pulse Rate:  [77-107] 95 (09/15 0910) Resp:  [16-19] 18 (09/15 0910) BP: (90-119)/(54-91) 90/54 (09/15 0910) SpO2:  [96 %-99 %] 97 % (09/15 0910) Last BM Date : 07/03/22  Intake/Output from previous day: 09/14 0701 - 09/15 0700 In: 940.1 [I.V.:940.1] Out: 1375 [Urine:800; Emesis/NG output:150; Stool:425] Intake/Output this shift: Total I/O In: -  Out: 50 [Emesis/NG output:50]  PE: Gen:  Alert, actively vomiting Card:  Reg Pulm:  Rate and effort normal Abd: Soft, NT/ND. Stoma viable. Ostomy bag with copious effluent Ext:  No LE edema. MAE's Psych: A&Ox3   Lab Results:  Recent Labs    07/02/22 1059 07/03/22 0434  WBC 2.9* 1.8*  HGB 11.4* 8.4*  HCT 34.5* 26.0*  PLT 58* 49*   BMET Recent Labs    07/02/22 1059 07/03/22 0434  NA 136 140  K 3.7 3.2*  CL 102 109  CO2 25 26  GLUCOSE 131* 82  BUN 6* 7*  CREATININE 0.49 0.34*  CALCIUM 8.7* 7.4*   PT/INR No results for input(s): "LABPROT", "INR" in the last 72 hours. CMP     Component Value Date/Time   NA 140 07/03/2022 0434   NA 143 01/26/2022 0830   K 3.2 (L) 07/03/2022 0434   CL 109 07/03/2022 0434   CO2 26 07/03/2022 0434   GLUCOSE 82 07/03/2022 0434   BUN 7 (L) 07/03/2022 0434   BUN 5 (L) 01/26/2022 0830   CREATININE 0.34 (L) 07/03/2022 0434   CREATININE 0.48 06/23/2022 0805   CALCIUM 7.4 (L) 07/03/2022 0434   PROT 4.2 (L) 07/03/2022 0434   PROT 6.5 01/26/2022 0830   ALBUMIN 2.2 (L) 07/03/2022 0434   ALBUMIN 4.5 01/26/2022 0830   AST 22 07/03/2022 0434   AST 27 06/23/2022 0805   ALT 26 07/03/2022 0434   ALT 29 06/23/2022 0805   ALKPHOS 202 (H) 07/03/2022 0434   BILITOT 0.6 07/03/2022 0434   BILITOT 0.5 06/23/2022 0805   GFRNONAA  >60 07/03/2022 0434   GFRNONAA >60 06/23/2022 0805   GFRAA 92 11/28/2020 0817   Lipase     Component Value Date/Time   LIPASE 20 07/02/2022 1059    Studies/Results: DG Abd Portable 1V-Small Bowel Obstruction Protocol-initial, 8 hr delay  Result Date: 07/03/2022 CLINICAL DATA:  Small-bowel protocol, 8 hours post contrast film. EXAM: PORTABLE ABDOMEN - 1 VIEW COMPARISON:  07/02/2022. FINDINGS: Distended loops of small bowel are present in the mid left abdomen measuring up to 3.4 cm. An enteric tube is noted in the stomach. Contrast is noted in the small and large bowel. No radio-opaque calculi or other significant radiographic abnormality are seen. IMPRESSION: Mildly distended loops of small bowel in the mid left abdomen measuring up to 3.4 cm, improved from the prior exam. Contrast is present in the small bowel and colon suggesting ileus versus partial obstruction. Electronically Signed   By: Brett Fairy M.D.   On: 07/03/2022 02:47   DG Abd Portable 1V-Small Bowel Protocol-Position Verification  Result Date: 07/02/2022 CLINICAL DATA:  NG tube placement. EXAM: PORTABLE ABDOMEN - 1 VIEW COMPARISON:  June 27, 2022 FINDINGS: The bowel gas pattern is normal. No radio-opaque calculi or other significant radiographic abnormality are seen.  Enteric catheter overlies the expected location of gastric body. IMPRESSION: Enteric catheter overlies the expected location of gastric body. Electronically Signed   By: Fidela Salisbury M.D.   On: 07/02/2022 16:10   CT ABDOMEN PELVIS W CONTRAST  Result Date: 07/02/2022 CLINICAL DATA:  Abdominal pain, acute nonlocalized. Recent admission for small bowel obstruction. History of metastatic rectal cancer. * Tracking Code: BO * EXAM: CT ABDOMEN AND PELVIS WITH CONTRAST TECHNIQUE: Multidetector CT imaging of the abdomen and pelvis was performed using the standard protocol following bolus administration of intravenous contrast. RADIATION DOSE REDUCTION: This exam  was performed according to the departmental dose-optimization program which includes automated exposure control, adjustment of the mA and/or kV according to patient size and/or use of iterative reconstruction technique. CONTRAST:  159m OMNIPAQUE IOHEXOL 300 MG/ML  SOLN COMPARISON:  CT 05/08/2022 and 03/11/2022. FINDINGS: Lower chest: Clear lung bases. No significant pleural or pericardial effusion. Unchanged right pericardiac node measuring 8 mm on image 16/2. Hepatobiliary: Partially treated hepatic metastatic disease is similar to the recent study with the largest lesions measuring 10 mm superiorly in the right lobe (segment 7, image 14/2) and 10 mm inferiorly in the right lobe (segment 6, image 25/2). There are hepatic cysts as well. No new or enlarging hepatic lesions. No evidence of gallstones, gallbladder wall thickening or biliary dilatation. Pancreas: Unremarkable. No pancreatic ductal dilatation or surrounding inflammatory changes. Spleen: Normal in size without focal abnormality. Adrenals/Urinary Tract: Both adrenal glands appear normal. No evidence of urinary tract calculus, suspicious renal lesion or hydronephrosis. Stable small renal cysts which do not require any imaging follow-up. The bladder appears unremarkable for its degree of distention. Stomach/Bowel: No enteric contrast administered. The stomach and proximal small bowel appear unremarkable. Multiple moderately dilated loops of small bowel are again noted within the left abdomen, measuring up to 4 cm in diameter. Focal transition point is noted in the periumbilical region with angulated bowel and wall thickening. The distal small bowel is decompressed. Appearance is similar to the recent prior study. The appendix appears normal. There is fluid in the proximal colon which is normal in caliber. The distal transverse colon and proximal descending colon are decompressed proximal to the left-sided loop colostomy. Persistent rectosigmoid  circumferential wall thickening. Vascular/Lymphatic: There are no enlarged abdominal lymph nodes. Residual small perirectal lymph nodes are unchanged. No significant vascular findings. Reproductive: Hysterectomy.  No suspicious adnexal findings. Other: Small volume of ascites, similar to recent prior study. No peritoneal or omental nodularity identified. Musculoskeletal: Multiple predominately sclerotic osseous metastases are similar to the recent study. There are compression deformities involving the superior endplates of the TX73 L1 and L3 vertebral bodies. The lumbar compression deformities are stable. T10 was not imaged on the most recent studies, but this fracture appears new compared with a chest CT performed 02/18/2022. IMPRESSION: 1. Persistent partial mid small bowel obstruction, similar to prior examination of 5 days earlier. As noted before, there is some small-bowel wall thickening at the level of the transition point, but no apparent adjacent mass lesion. 2. Unchanged ascites without evidence of progressive peritoneal disease. 3. Stable partially treated hepatic and osseous metastatic disease compared with recent prior studies. 4. T12 compression fracture appears new from 02/18/2022, not imaged on more recent abdominal studies. Electronically Signed   By: WRichardean SaleM.D.   On: 07/02/2022 13:43    Anti-infectives: Anti-infectives (From admission, onward)    None        Assessment/Plan Metastatic rectal cancer with mets to the liver,  retroperitoneal lymph nodes, and the lumbar spine who is s/p loop colostomy placed by Dr. Johney Maine in the mid descending colon in May 2023 and is currently undergoing chemo and radiation who presented with upper abdominal pain, n/v and no ostomy output shortly after new radiation treatment yesterday. Her CT scan demonstrates an SBO with transition point at area of small-bowel wall thickening. Question if CT scan findings are 2/2 radiation induced enteritis  causing sbo symptoms/findings. Recommend NPO and NGT placement. Will proceed with SBO protocol. We will follow.   FEN - MIVF, clamp NG, ice chips VTE - SCDs, okay for chemical prophylaxis from a general surgery standpoint ID - None indicated Admit - TRH  New T12 fx on imaging HLD   LOS: 1 day   Check [email or web address] for General Surgery coverage night/weekend/holidays  Page if acute issues. No secure chat available for me given surgeries/clinic/off post call which would lead to a delay in care.  Nadeen Landau, MD [facility], [facility]

## 2022-07-04 DIAGNOSIS — K56609 Unspecified intestinal obstruction, unspecified as to partial versus complete obstruction: Secondary | ICD-10-CM | POA: Diagnosis not present

## 2022-07-04 LAB — COMPREHENSIVE METABOLIC PANEL
ALT: 25 U/L (ref 0–44)
AST: 23 U/L (ref 15–41)
Albumin: 2 g/dL — ABNORMAL LOW (ref 3.5–5.0)
Alkaline Phosphatase: 195 U/L — ABNORMAL HIGH (ref 38–126)
Anion gap: 2 — ABNORMAL LOW (ref 5–15)
BUN: <5 mg/dL — ABNORMAL LOW (ref 8–23)
CO2: 27 mmol/L (ref 22–32)
Calcium: 7.6 mg/dL — ABNORMAL LOW (ref 8.9–10.3)
Chloride: 109 mmol/L (ref 98–111)
Creatinine, Ser: 0.37 mg/dL — ABNORMAL LOW (ref 0.44–1.00)
GFR, Estimated: >60 mL/min (ref >60)
Glucose, Bld: 87 mg/dL (ref 70–99)
Potassium: 3.1 mmol/L — ABNORMAL LOW (ref 3.5–5.1)
Sodium: 138 mmol/L (ref 135–145)
Total Bilirubin: 0.5 mg/dL (ref 0.3–1.2)
Total Protein: 3.8 g/dL — ABNORMAL LOW (ref 6.5–8.1)

## 2022-07-04 LAB — CBC
HCT: 24.6 % — ABNORMAL LOW (ref 36.0–46.0)
Hemoglobin: 7.9 g/dL — ABNORMAL LOW (ref 12.0–15.0)
MCH: 31.5 pg (ref 26.0–34.0)
MCHC: 32.1 g/dL (ref 30.0–36.0)
MCV: 98 fL (ref 80.0–100.0)
Platelets: 50 10*3/uL — ABNORMAL LOW (ref 150–400)
RBC: 2.51 MIL/uL — ABNORMAL LOW (ref 3.87–5.11)
RDW: 17.1 % — ABNORMAL HIGH (ref 11.5–15.5)
WBC: 4.8 10*3/uL (ref 4.0–10.5)
nRBC: 0.8 % — ABNORMAL HIGH (ref 0.0–0.2)

## 2022-07-04 LAB — MAGNESIUM: Magnesium: 2 mg/dL (ref 1.7–2.4)

## 2022-07-04 MED ORDER — KCL IN DEXTROSE-NACL 40-5-0.9 MEQ/L-%-% IV SOLN
INTRAVENOUS | Status: DC
  Filled 2022-07-04 (×4): qty 1000

## 2022-07-04 MED ORDER — ALUM & MAG HYDROXIDE-SIMETH 200-200-20 MG/5ML PO SUSP
30.0000 mL | ORAL | Status: DC | PRN
  Administered 2022-07-04 (×3): 30 mL via ORAL
  Filled 2022-07-04 (×3): qty 30

## 2022-07-04 NOTE — Progress Notes (Signed)
Assessment & Plan: HD#3 - small bowel obstruction vs radiation enteritis Metastatic rectal cancer with mets to the liver, retroperitoneal lymph nodes, and the lumbar spine who is s/p loop colostomy placed by Dr. Johney Maine in the mid descending colon in May 2023 and is currently undergoing chemo and radiation who presented with upper abdominal pain, n/v and no ostomy output shortly after new radiation treatment yesterday. Her CT scan demonstrates an SBO with transition point at area of small-bowel wall thickening. Question if CT scan findings are due to radiation induced enteritis causing sbo symptoms/findings.   FEN - NG out, advancing to full liquids today VTE - SCDs ID - None indicated Admit - TRH   New T12 fx on imaging HLD  Appears to be clinically improving.  NG removed, Dr. Verlon Au advancing to full liquid diet today.  Encouraged OOB, ambulation.  Will follow.        Karen Gemma, MD [facility] A [place] practice Office: [phone]        Chief Complaint: SBO  Subjective: Patient appears comfortable, husband at bedside.  Tolerated clear liquids.  Objective: Vital signs in last 24 hours: Temp:  [97.7 F (36.5 C)-98.2 F (36.8 C)] 98.2 F (36.8 C) (09/16 0442) Pulse Rate:  [76-94] 76 (09/16 0442) Resp:  [18] 18 (09/16 0442) BP: (99-113)/(60-62) 105/62 (09/16 0442) SpO2:  [98 %] 98 % (09/16 0442) Weight:  [55 kg] 55 kg (09/15 2202) Last BM Date : 07/04/22  Intake/Output from previous day: 09/15 0701 - 09/16 0700 In: 1268.9 [P.O.:120; I.V.:1148.9] Out: 1550 [Urine:1100; Emesis/NG output:100; Stool:350] Intake/Output this shift: No intake/output data recorded.  Physical Exam: HEENT - sclerae clear, mucous membranes moist Neck - soft Abdomen - soft, mild distension; ostomy LUQ viable with small succus in bag  Lab Results:  Recent Labs    07/03/22 0434 07/04/22 0530  WBC 1.8* 4.8  HGB 8.4* 7.9*  HCT 26.0* 24.6*  PLT 49* 50*   BMET Recent  Labs    07/03/22 0434 07/04/22 0530  NA 140 138  K 3.2* 3.1*  CL 109 109  CO2 26 27  GLUCOSE 82 87  BUN 7* <5*  CREATININE 0.34* 0.37*  CALCIUM 7.4* 7.6*   PT/INR No results for input(s): "LABPROT", "INR" in the last 72 hours. Comprehensive Metabolic Panel:    Component Value Date/Time   NA 138 07/04/2022 0530   NA 140 07/03/2022 0434   NA 143 01/26/2022 0830   NA 145 (H) 05/30/2021 0837   K 3.1 (L) 07/04/2022 0530   K 3.2 (L) 07/03/2022 0434   CL 109 07/04/2022 0530   CL 109 07/03/2022 0434   CO2 27 07/04/2022 0530   CO2 26 07/03/2022 0434   BUN <5 (L) 07/04/2022 0530   BUN 7 (L) 07/03/2022 0434   BUN 5 (L) 01/26/2022 0830   BUN 6 (L) 05/30/2021 0837   CREATININE 0.37 (L) 07/04/2022 0530   CREATININE 0.34 (L) 07/03/2022 0434   CREATININE 0.48 06/23/2022 0805   CREATININE 0.52 06/10/2022 0806   GLUCOSE 87 07/04/2022 0530   GLUCOSE 82 07/03/2022 0434   CALCIUM 7.6 (L) 07/04/2022 0530   CALCIUM 7.4 (L) 07/03/2022 0434   AST 23 07/04/2022 0530   AST 22 07/03/2022 0434   AST 27 06/23/2022 0805   AST 73 (H) 06/10/2022 0806   ALT 25 07/04/2022 0530   ALT 26 07/03/2022 0434   ALT 29 06/23/2022 0805   ALT 56 (H) 06/10/2022 0806   ALKPHOS 195 (  H) 07/04/2022 0530   ALKPHOS 202 (H) 07/03/2022 0434   BILITOT 0.5 07/04/2022 0530   BILITOT 0.6 07/03/2022 0434   BILITOT 0.5 06/23/2022 0805   BILITOT 0.5 06/10/2022 0806   PROT 3.8 (L) 07/04/2022 0530   PROT 4.2 (L) 07/03/2022 0434   PROT 6.5 01/26/2022 0830   PROT 6.6 05/30/2021 0837   ALBUMIN 2.0 (L) 07/04/2022 0530   ALBUMIN 2.2 (L) 07/03/2022 0434   ALBUMIN 4.5 01/26/2022 0830   ALBUMIN 4.4 05/30/2021 0837    Studies/Results: DG Abd Portable 1V-Small Bowel Obstruction Protocol-24 hr delay  Result Date: 07/03/2022 CLINICAL DATA:  Small-bowel obstruction, 24 hour delayed exam EXAM: PORTABLE ABDOMEN - 1 VIEW COMPARISON:  07/03/2022 at 2 a.m. FINDINGS: Supine frontal view of the abdomen and pelvis excludes the  hemidiaphragms by collimation. Dilute oral contrast is now seen within the descending colon. No evidence of bowel obstruction. Mildly distended small bowel loops in the left mid abdomen less prominent on this exam, which may reflect resolving ileus. No masses or abnormal calcifications. IMPRESSION: 1. Further transit of oral contrast into the distal colon. No evidence of high-grade obstruction. Residual small bowel dilatation may reflect resolving ileus. Electronically Signed   By: Randa Ngo M.D.   On: 07/03/2022 18:19   DG Abd Portable 1V-Small Bowel Obstruction Protocol-initial, 8 hr delay  Result Date: 07/03/2022 CLINICAL DATA:  Small-bowel protocol, 8 hours post contrast film. EXAM: PORTABLE ABDOMEN - 1 VIEW COMPARISON:  07/02/2022. FINDINGS: Distended loops of small bowel are present in the mid left abdomen measuring up to 3.4 cm. An enteric tube is noted in the stomach. Contrast is noted in the small and large bowel. No radio-opaque calculi or other significant radiographic abnormality are seen. IMPRESSION: Mildly distended loops of small bowel in the mid left abdomen measuring up to 3.4 cm, improved from the prior exam. Contrast is present in the small bowel and colon suggesting ileus versus partial obstruction. Electronically Signed   By: Brett Fairy M.D.   On: 07/03/2022 02:47   DG Abd Portable 1V-Small Bowel Protocol-Position Verification  Result Date: 07/02/2022 CLINICAL DATA:  NG tube placement. EXAM: PORTABLE ABDOMEN - 1 VIEW COMPARISON:  June 27, 2022 FINDINGS: The bowel gas pattern is normal. No radio-opaque calculi or other significant radiographic abnormality are seen. Enteric catheter overlies the expected location of gastric body. IMPRESSION: Enteric catheter overlies the expected location of gastric body. Electronically Signed   By: Fidela Salisbury M.D.   On: 07/02/2022 16:10   CT ABDOMEN PELVIS W CONTRAST  Result Date: 07/02/2022 CLINICAL DATA:  Abdominal pain, acute  nonlocalized. Recent admission for small bowel obstruction. History of metastatic rectal cancer. * Tracking Code: BO * EXAM: CT ABDOMEN AND PELVIS WITH CONTRAST TECHNIQUE: Multidetector CT imaging of the abdomen and pelvis was performed using the standard protocol following bolus administration of intravenous contrast. RADIATION DOSE REDUCTION: This exam was performed according to the departmental dose-optimization program which includes automated exposure control, adjustment of the mA and/or kV according to patient size and/or use of iterative reconstruction technique. CONTRAST:  146m OMNIPAQUE IOHEXOL 300 MG/ML  SOLN COMPARISON:  CT 05/08/2022 and 03/11/2022. FINDINGS: Lower chest: Clear lung bases. No significant pleural or pericardial effusion. Unchanged right pericardiac node measuring 8 mm on image 16/2. Hepatobiliary: Partially treated hepatic metastatic disease is similar to the recent study with the largest lesions measuring 10 mm superiorly in the right lobe (segment 7, image 14/2) and 10 mm inferiorly in the right lobe (segment 6, image  25/2). There are hepatic cysts as well. No new or enlarging hepatic lesions. No evidence of gallstones, gallbladder wall thickening or biliary dilatation. Pancreas: Unremarkable. No pancreatic ductal dilatation or surrounding inflammatory changes. Spleen: Normal in size without focal abnormality. Adrenals/Urinary Tract: Both adrenal glands appear normal. No evidence of urinary tract calculus, suspicious renal lesion or hydronephrosis. Stable small renal cysts which do not require any imaging follow-up. The bladder appears unremarkable for its degree of distention. Stomach/Bowel: No enteric contrast administered. The stomach and proximal small bowel appear unremarkable. Multiple moderately dilated loops of small bowel are again noted within the left abdomen, measuring up to 4 cm in diameter. Focal transition point is noted in the periumbilical region with angulated bowel  and wall thickening. The distal small bowel is decompressed. Appearance is similar to the recent prior study. The appendix appears normal. There is fluid in the proximal colon which is normal in caliber. The distal transverse colon and proximal descending colon are decompressed proximal to the left-sided loop colostomy. Persistent rectosigmoid circumferential wall thickening. Vascular/Lymphatic: There are no enlarged abdominal lymph nodes. Residual small perirectal lymph nodes are unchanged. No significant vascular findings. Reproductive: Hysterectomy.  No suspicious adnexal findings. Other: Small volume of ascites, similar to recent prior study. No peritoneal or omental nodularity identified. Musculoskeletal: Multiple predominately sclerotic osseous metastases are similar to the recent study. There are compression deformities involving the superior endplates of the N23, L1 and L3 vertebral bodies. The lumbar compression deformities are stable. T10 was not imaged on the most recent studies, but this fracture appears new compared with a chest CT performed 02/18/2022. IMPRESSION: 1. Persistent partial mid small bowel obstruction, similar to prior examination of 5 days earlier. As noted before, there is some small-bowel wall thickening at the level of the transition point, but no apparent adjacent mass lesion. 2. Unchanged ascites without evidence of progressive peritoneal disease. 3. Stable partially treated hepatic and osseous metastatic disease compared with recent prior studies. 4. T12 compression fracture appears new from 02/18/2022, not imaged on more recent abdominal studies. Electronically Signed   By: Richardean Sale M.D.   On: 07/02/2022 13:43      Karen Kaufman 07/04/2022   Patient ID: Malena Catholic, female   DOB: 01-25-1961, 61 y.o.   MRN: [id number]

## 2022-07-04 NOTE — Progress Notes (Signed)
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  Radiation enteritis-NG tube recommended Also pancytopenic WBC 2.9 platelets 58 ANC 1.3  General surgery consulted recommending small bowel protocol Has had improvement of symptoms and is now able to keep down fluids asking to tolerate more diet  Hospital-Problem based course  Recurrent symptomatic SBO Graduated to full liquid diet-pain seems controlled-morphine 4 mg every 4 as needed pain Keep on saline 50 cc/H-can saline lock in a.m.  Moderate pancytopenia in the setting of advanced rectosigmoid CA on chemo Probably related to platinum based chemo--White count has improved other parameters improving slower Check CBC differential in a.m. and reassess  Mild hypokalemia Remains hypokalemic so added 40 of KCl to D5 we will check a.m. magnesium  Resume cholesterol and other meds on discharge  DVT prophylaxis: SCD Code Status: Full Family Communication: Discussed with husband at the bedside Disposition:  Status is: Inpatient Remains inpatient appropriate because:   Awaiting resolution of bowel function   Consultants:  General surgery  Procedures: None  Antimicrobials: No   Subjective:  Changed her ostomy about 3 times no chest pain no fever no nausea wants to advance diet so I discussed with her going to full liquids  Objective: Vitals:   07/03/22 1220 07/03/22 2154 07/03/22 2202 07/04/22  0442  BP: 99/60 113/61  105/62  Pulse: 94 83  76  Resp: '18 18  18  '$ Temp: 97.7 F (36.5 C) 97.8 F (36.6 C)  98.2 F (36.8 C)  TempSrc: Oral Oral  Oral  SpO2: 98% 98%  98%  Weight:   55 kg   Height:   '5\' 6"'$  (1.676 m)     Intake/Output Summary (Last 24 hours) at 07/04/2022 1356 Last data filed at 07/04/2022 1126 Gross per 24 hour  Intake 1226.38 ml  Output 1950 ml  Net -723.62 ml    Filed Weights   07/03/22 2202  Weight: 55 kg    Examination:  EOMI NCAT no focal deficit CTA B no added sound rales rhonchi No wheeze Ostomy reviewed and seems to be filling appropriately-abdomen is soft no rebound Trace lower extremity edema Neurologically intact-patient coherent  Data Reviewed: personally reviewed   CBC    Component Value Date/Time   WBC 4.8 07/04/2022 0530   RBC 2.51 (L) 07/04/2022 0530   HGB 7.9 (L) 07/04/2022 0530   HGB 9.6 (L) 06/30/2022 1449   HGB 13.9 01/26/2022 0830   HCT 24.6 (L) 07/04/2022 0530   HCT 41.8 01/26/2022 0830   PLT 50 (L) 07/04/2022 0530   PLT 35 (L) 06/30/2022 1449   PLT 266 01/26/2022 0830   MCV 98.0 07/04/2022 0530   MCV 90 01/26/2022 0830   MCH 31.5 07/04/2022 0530   MCHC 32.1 07/04/2022 0530   RDW 17.1 (H) 07/04/2022 0530   RDW 12.3 01/26/2022 0830   LYMPHSABS 0.4 (L) 07/02/2022 1059   LYMPHSABS 1.8 01/26/2022 0830   MONOABS 0.8 07/02/2022 1059   EOSABS 0.0 07/02/2022 1059   EOSABS 0.3 01/26/2022 0830  BASOSABS 0.0 07/02/2022 1059   BASOSABS 0.1 01/26/2022 0830      Latest Ref Rng & Units 07/04/2022    5:30 AM 07/03/2022    4:34 AM 07/02/2022   10:59 AM  CMP  Glucose 70 - 99 mg/dL 87  82  131   BUN 8 - 23 mg/dL '5  7  6   '$ Creatinine 0.44 - 1.00 mg/dL 0.37  0.34  0.49   Sodium 135 - 145 mmol/L 138  140  136   Potassium 3.5 - 5.1 mmol/L 3.1  3.2  3.7   Chloride 98 - 111 mmol/L 109  109  102   CO2 22 - 32 mmol/L '27  26  25   '$ Calcium 8.9 - 10.3 mg/dL 7.6  7.4  8.7   Total Protein 6.5 - 8.1 g/dL 3.8  4.2  5.5   Total  Bilirubin 0.3 - 1.2 mg/dL 0.5  0.6  0.8   Alkaline Phos 38 - 126 U/L 195  202  265   AST 15 - 41 U/L 23  22  34   ALT 0 - 44 U/L 25  26  38      Radiology Studies: DG Abd Portable 1V-Small Bowel Obstruction Protocol-24 hr delay  Result Date: 07/03/2022 CLINICAL DATA:  Small-bowel obstruction, 24 hour delayed exam EXAM: PORTABLE ABDOMEN - 1 VIEW COMPARISON:  07/03/2022 at 2 a.m. FINDINGS: Supine frontal view of the abdomen and pelvis excludes the hemidiaphragms by collimation. Dilute oral contrast is now seen within the descending colon. No evidence of bowel obstruction. Mildly distended small bowel loops in the left mid abdomen less prominent on this exam, which may reflect resolving ileus. No masses or abnormal calcifications. IMPRESSION: 1. Further transit of oral contrast into the distal colon. No evidence of high-grade obstruction. Residual small bowel dilatation may reflect resolving ileus. Electronically Signed   By: Randa Ngo M.D.   On: 07/03/2022 18:19   DG Abd Portable 1V-Small Bowel Obstruction Protocol-initial, 8 hr delay  Result Date: 07/03/2022 CLINICAL DATA:  Small-bowel protocol, 8 hours post contrast film. EXAM: PORTABLE ABDOMEN - 1 VIEW COMPARISON:  07/02/2022. FINDINGS: Distended loops of small bowel are present in the mid left abdomen measuring up to 3.4 cm. An enteric tube is noted in the stomach. Contrast is noted in the small and large bowel. No radio-opaque calculi or other significant radiographic abnormality are seen. IMPRESSION: Mildly distended loops of small bowel in the mid left abdomen measuring up to 3.4 cm, improved from the prior exam. Contrast is present in the small bowel and colon suggesting ileus versus partial obstruction. Electronically Signed   By: Brett Fairy M.D.   On: 07/03/2022 02:47   DG Abd Portable 1V-Small Bowel Protocol-Position Verification  Result Date: 07/02/2022 CLINICAL DATA:  NG tube placement. EXAM: PORTABLE ABDOMEN - 1 VIEW COMPARISON:   June 27, 2022 FINDINGS: The bowel gas pattern is normal. No radio-opaque calculi or other significant radiographic abnormality are seen. Enteric catheter overlies the expected location of gastric body. IMPRESSION: Enteric catheter overlies the expected location of gastric body. Electronically Signed   By: Fidela Salisbury M.D.   On: 07/02/2022 16:10     Scheduled Meds:  Chlorhexidine Gluconate Cloth  6 each Topical Daily   pantoprazole (PROTONIX) IV  40 mg Intravenous Q24H   sodium chloride flush  10-40 mL Intracatheter Q12H   Continuous Infusions:  dextrose 5 % and 0.9 % NaCl with KCl 40 mEq/L 75 mL/hr at 07/04/22 0913  LOS: 2 days   Time spent: 76  Nita Sells, MD [facility] To contact the attending provider between 7A-7P or the covering provider during after hours 7P-7A, please log into the web site [email or web address] and access using universal  password for that web site. If you do not have the password, please call the hospital operator.  07/04/2022, 1:56 PM

## 2022-07-05 DIAGNOSIS — K56609 Unspecified intestinal obstruction, unspecified as to partial versus complete obstruction: Secondary | ICD-10-CM | POA: Diagnosis not present

## 2022-07-05 LAB — BASIC METABOLIC PANEL
Anion gap: 2 — ABNORMAL LOW (ref 5–15)
BUN: <5 mg/dL — ABNORMAL LOW (ref 8–23)
CO2: 25 mmol/L (ref 22–32)
Calcium: 7.6 mg/dL — ABNORMAL LOW (ref 8.9–10.3)
Chloride: 108 mmol/L (ref 98–111)
Creatinine, Ser: <0.3 mg/dL — ABNORMAL LOW (ref 0.44–1.00)
Glucose, Bld: 123 mg/dL — ABNORMAL HIGH (ref 70–99)
Potassium: 3.8 mmol/L (ref 3.5–5.1)
Sodium: 135 mmol/L (ref 135–145)

## 2022-07-05 LAB — CBC
HCT: 28.6 % — ABNORMAL LOW (ref 36.0–46.0)
Hemoglobin: 9.3 g/dL — ABNORMAL LOW (ref 12.0–15.0)
MCH: 31.6 pg (ref 26.0–34.0)
MCHC: 32.5 g/dL (ref 30.0–36.0)
MCV: 97.3 fL (ref 80.0–100.0)
Platelets: 86 10*3/uL — ABNORMAL LOW (ref 150–400)
RBC: 2.94 MIL/uL — ABNORMAL LOW (ref 3.87–5.11)
RDW: 17.6 % — ABNORMAL HIGH (ref 11.5–15.5)
WBC: 9.5 10*3/uL (ref 4.0–10.5)
nRBC: 0.6 % — ABNORMAL HIGH (ref 0.0–0.2)

## 2022-07-05 LAB — MAGNESIUM: Magnesium: 1.7 mg/dL (ref 1.7–2.4)

## 2022-07-05 MED ORDER — HEPARIN SOD (PORK) LOCK FLUSH 100 UNIT/ML IV SOLN
500.0000 [IU] | INTRAVENOUS | Status: AC | PRN
  Administered 2022-07-05: 500 [IU]
  Filled 2022-07-05: qty 5

## 2022-07-05 NOTE — Progress Notes (Signed)
Assessment & Plan: HD#4 - small bowel obstruction vs radiation enteritis Metastatic rectal cancer with mets to the liver, retroperitoneal lymph nodes, and the lumbar spine who is s/p loop colostomy placed by Dr. Johney Maine in the mid descending colon in May 2023 and is currently undergoing chemo and radiation who presented with upper abdominal pain, n/v and no ostomy output shortly after new radiation treatment yesterday. Her CT scan demonstrates an SBO with transition point at area of small-bowel wall thickening. Question if CT scan findings are due to radiation induced enteritis causing sbo symptoms/findings.   FEN - advance to regular diet this AM VTE - SCDs ID - None indicated Admit - TRH   New T12 fx on imaging HLD   Appears to be clinically improving.  Better output in ostomy bag this AM.  Will advance to regular diet this AM at patient request.  Hopefully home later today or tomorrow if tolerated.        Armandina Gemma, MD [facility] A [place] practice Office: [phone]        Chief Complaint: SBO, metastatic carcinoma  Subjective: Patient up in chair, family in room.  Wants to eat pancakes.  Objective: Vital signs in last 24 hours: Temp:  [98 F (36.7 C)-98.3 F (36.8 C)] 98 F (36.7 C) (09/17 0510) Pulse Rate:  [76-86] 76 (09/17 0510) Resp:  [16-18] 18 (09/17 0510) BP: (94-108)/(61-69) 101/69 (09/17 0510) SpO2:  [99 %-100 %] 99 % (09/17 0510) Last BM Date : 07/04/22  Intake/Output from previous day: 09/16 0701 - 09/17 0700 In: 1663.9 [P.O.:720; I.V.:943.9] Out: 3550 [Urine:3250; Stool:300] Intake/Output this shift: Total I/O In: 240 [P.O.:240] Out: 350 [Urine:350]  Physical Exam: HEENT - sclerae clear, mucous membranes moist Neck - soft Abdomen - ostomy with moderate brown fecal output in bag Neuro - alert & oriented, no focal deficits  Lab Results:  Recent Labs    07/04/22 0530 07/05/22 0800  WBC 4.8 9.5  HGB 7.9* 9.3*  HCT 24.6*  28.6*  PLT 50* 86*   BMET Recent Labs    07/04/22 0530 07/05/22 0500  NA 138 135  K 3.1* 3.8  CL 109 108  CO2 27 25  GLUCOSE 87 123*  BUN <5* <5*  CREATININE 0.37* <0.30*  CALCIUM 7.6* 7.6*   PT/INR No results for input(s): "LABPROT", "INR" in the last 72 hours. Comprehensive Metabolic Panel:    Component Value Date/Time   NA 135 07/05/2022 0500   NA 138 07/04/2022 0530   NA 143 01/26/2022 0830   NA 145 (H) 05/30/2021 0837   K 3.8 07/05/2022 0500   K 3.1 (L) 07/04/2022 0530   CL 108 07/05/2022 0500   CL 109 07/04/2022 0530   CO2 25 07/05/2022 0500   CO2 27 07/04/2022 0530   BUN <5 (L) 07/05/2022 0500   BUN <5 (L) 07/04/2022 0530   BUN 5 (L) 01/26/2022 0830   BUN 6 (L) 05/30/2021 0837   CREATININE <0.30 (L) 07/05/2022 0500   CREATININE 0.37 (L) 07/04/2022 0530   CREATININE 0.48 06/23/2022 0805   CREATININE 0.52 06/10/2022 0806   GLUCOSE 123 (H) 07/05/2022 0500   GLUCOSE 87 07/04/2022 0530   CALCIUM 7.6 (L) 07/05/2022 0500   CALCIUM 7.6 (L) 07/04/2022 0530   AST 23 07/04/2022 0530   AST 22 07/03/2022 0434   AST 27 06/23/2022 0805   AST 73 (H) 06/10/2022 0806   ALT 25 07/04/2022 0530   ALT 26 07/03/2022 0434   ALT  29 06/23/2022 0805   ALT 56 (H) 06/10/2022 0806   ALKPHOS 195 (H) 07/04/2022 0530   ALKPHOS 202 (H) 07/03/2022 0434   BILITOT 0.5 07/04/2022 0530   BILITOT 0.6 07/03/2022 0434   BILITOT 0.5 06/23/2022 0805   BILITOT 0.5 06/10/2022 0806   PROT 3.8 (L) 07/04/2022 0530   PROT 4.2 (L) 07/03/2022 0434   PROT 6.5 01/26/2022 0830   PROT 6.6 05/30/2021 0837   ALBUMIN 2.0 (L) 07/04/2022 0530   ALBUMIN 2.2 (L) 07/03/2022 0434   ALBUMIN 4.5 01/26/2022 0830   ALBUMIN 4.4 05/30/2021 0837    Studies/Results: DG Abd Portable 1V-Small Bowel Obstruction Protocol-24 hr delay  Result Date: 07/03/2022 CLINICAL DATA:  Small-bowel obstruction, 24 hour delayed exam EXAM: PORTABLE ABDOMEN - 1 VIEW COMPARISON:  07/03/2022 at 2 a.m. FINDINGS: Supine frontal view of  the abdomen and pelvis excludes the hemidiaphragms by collimation. Dilute oral contrast is now seen within the descending colon. No evidence of bowel obstruction. Mildly distended small bowel loops in the left mid abdomen less prominent on this exam, which may reflect resolving ileus. No masses or abnormal calcifications. IMPRESSION: 1. Further transit of oral contrast into the distal colon. No evidence of high-grade obstruction. Residual small bowel dilatation may reflect resolving ileus. Electronically Signed   By: Randa Ngo M.D.   On: 07/03/2022 18:19      Armandina Gemma 07/05/2022   Patient ID: Karen Kaufman, female   DOB: [birth year], 61 y.o.   MRN: [id number]

## 2022-07-05 NOTE — Plan of Care (Signed)

## 2022-07-05 NOTE — Progress Notes (Signed)
Assessment unchanged. Pt and husband verbalized understanding of dc instructions through teach back including medications, follow up care and when to call the MD. Discharged via wc to front entrance escorted by NT and husband.

## 2022-07-05 NOTE — Discharge Summary (Signed)
Physician Discharge Summary  TRICHELLE LEHAN GNF:[id number] DOB: [birth year] DOA: 07/02/2022  PCP: Gwenlyn Perking, FNP  Admit date: 07/02/2022 Discharge date: 07/05/2022  Time spent: 45 minutes  Recommendations for Outpatient Follow-up:  CBC Chem-12 in about 1 week Recommend outpatient follow-up with general surgery, oncology  Discharge Diagnoses:  MAIN problem for hospitalization  SBO Pancytopenia likely secondary to chemo Hypokalemia on admission now resolved  Please see below for itemized issues addressed in [place]- refer to other progress notes for clarity if needed  Discharge Condition: Improved  Diet recommendation: Heart healthy  Filed Weights   07/03/22 2202  Weight: 55 kg    History of present illness:  61 year old white female 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  Radiation enteritis-NG tube recommended Also pancytopenic WBC 2.9 platelets 58 ANC 1.3   General surgery consulted recommending small bowel protocol Has had improvement of symptoms and is now able to keep down fluids asking to tolerate more diet    Hospital Course:  Recurrent symptomatic SBO Patient's diet was gradually increased to full liquid and carefully graduated to full.  Patient experienced no pain and was having a full ostomy It was felt that she was stabilized for discharge on 9/17   Moderate pancytopenia in the setting of advanced rectosigmoid CA on chemo Probably related to platinum based chemo-- Counts have all normalized some-platelets are improved to above 86 We feel that she is stable for  discharge  Mild hypokalemia Remains hypokalemic and added K to IV fluid Stabilized for discharge   Resume cholesterol and other meds on discharge  Procedures: Gastrografin protocol   Discharge Exam: Vitals:   07/05/22 0510 07/05/22 1322  BP: 101/69 125/79  Pulse: 76 (!) 105  Resp: 18 16  Temp: 98 F (36.7 C) (!) 97.5 F (36.4 C)  SpO2: 99% 100%    Subj on day of d/c   Awake coherent eating some wants to go home  General Exam on discharge  EOMI NCAT no focal deficit no icterus no pallor Chest clear no added sound rales rhonchi or wheeze CTA B no added sound ROM intact moving 4 limbs equally no lower extremity edema Neuro intact  Discharge Instructions   Discharge Instructions     Diet - low sodium heart healthy   Complete by: As directed    Discharge instructions   Complete by: As directed    Please take your usual medications as scheduled without changes Get labs in about 1 week at primary care physician office Report nausea vomiting obstruction symptoms or other issues to your primary physician and you may need to come to the emergency room if needed   Increase activity slowly   Complete by: As directed       Allergies as of 07/05/2022       Reactions   Iodine Anaphylaxis, Other (See Comments)   Patient states she is able to take the IV contrast that is given for CT scan, but not any other IV Contrast    Shellfish Allergy Anaphylaxis   Decadron [dexamethasone] Other (See Comments)   Severe acid reflux   Dilaudid [hydromorphone] Other (See Comments)  Burning sensation        Medication List     STOP taking these medications    magnesium oxide 400 (240 Mg) MG tablet Commonly known as: MAG-OX       TAKE these medications    aluminum-magnesium hydroxide-simethicone [phone] MG/5ML Susp Commonly known as: MAALOX Take 30 mLs by mouth as needed (For nausea, indigestion, heartburn).   atorvastatin 20 MG tablet Commonly known as:  LIPITOR Take 1 tablet (20 mg total) by mouth daily.   famotidine 20 MG tablet Commonly known as: PEPCID Take 1 tablet (20 mg total) by mouth 2 (two) times daily.   HYDROcodone-acetaminophen 5-325 MG tablet Commonly known as: Norco Take 1-2 tablets by mouth every 4 (four) hours as needed for moderate pain.   lidocaine-prilocaine cream Commonly known as: EMLA Apply to port site 1-2 hours prior to use What changed:  how much to take how to take this when to take this   loratadine 10 MG tablet Commonly known as: CLARITIN Take 10 mg by mouth See admin instructions. Take 1 tablet (10 mg) by mouth for 5 days after injection for white blood cell boost after end of chemo treatment   ondansetron 8 MG tablet Commonly known as: ZOFRAN Take 1 tablet (8 mg total) by mouth every 8 (eight) hours as needed for nausea or vomiting. Do not take until 72 hours after chemo   pantoprazole 20 MG tablet Commonly known as: Protonix Take 1 tablet (20 mg total) by mouth daily.   polyethylene glycol 17 g packet Commonly known as: MIRALAX / GLYCOLAX Take 17 g by mouth daily.   prochlorperazine 10 MG tablet Commonly known as: COMPAZINE Take 1 tablet (10 mg total) by mouth every 6 (six) hours as needed for nausea or vomiting.       Allergies  Allergen Reactions   Iodine Anaphylaxis and Other (See Comments)    Patient states she is able to take the IV contrast that is given for CT scan, but not any other IV Contrast    Shellfish Allergy Anaphylaxis   Decadron [Dexamethasone] Other (See Comments)    Severe acid reflux   Dilaudid [Hydromorphone] Other (See Comments)    Burning sensation      The results of significant diagnostics from this hospitalization (including imaging, microbiology, ancillary and laboratory) are listed below for reference.    Significant Diagnostic Studies: DG Abd Portable 1V-Small Bowel Obstruction Protocol-24 hr delay  Result Date: 07/03/2022 CLINICAL DATA:   Small-bowel obstruction, 24 hour delayed exam EXAM: PORTABLE ABDOMEN - 1 VIEW COMPARISON:  07/03/2022 at 2 a.m. FINDINGS: Supine frontal view of the abdomen and pelvis excludes the hemidiaphragms by collimation. Dilute oral contrast is now seen within the descending colon. No evidence of bowel obstruction. Mildly distended small bowel loops in the left mid abdomen less prominent on this exam, which may reflect resolving ileus. No masses or abnormal calcifications. IMPRESSION: 1. Further transit of oral contrast into the distal colon. No evidence of high-grade obstruction. Residual small bowel dilatation may reflect resolving ileus. Electronically Signed   By: Randa Ngo M.D.   On: 07/03/2022 18:19   DG Abd Portable 1V-Small Bowel Obstruction Protocol-initial, 8 hr delay  Result Date: 07/03/2022 CLINICAL DATA:  Small-bowel protocol, 8 hours post contrast film. EXAM: PORTABLE ABDOMEN - 1 VIEW COMPARISON:  07/02/2022. FINDINGS: Distended loops of small bowel are present in the mid left abdomen measuring up to 3.4 cm. An enteric tube is noted in the stomach. Contrast is noted  in the small and large bowel. No radio-opaque calculi or other significant radiographic abnormality are seen. IMPRESSION: Mildly distended loops of small bowel in the mid left abdomen measuring up to 3.4 cm, improved from the prior exam. Contrast is present in the small bowel and colon suggesting ileus versus partial obstruction. Electronically Signed   By: Brett Fairy M.D.   On: 07/03/2022 02:47   DG Abd Portable 1V-Small Bowel Protocol-Position Verification  Result Date: 07/02/2022 CLINICAL DATA:  NG tube placement. EXAM: PORTABLE ABDOMEN - 1 VIEW COMPARISON:  June 27, 2022 FINDINGS: The bowel gas pattern is normal. No radio-opaque calculi or other significant radiographic abnormality are seen. Enteric catheter overlies the expected location of gastric body. IMPRESSION: Enteric catheter overlies the expected location of gastric  body. Electronically Signed   By: Fidela Salisbury M.D.   On: 07/02/2022 16:10   CT ABDOMEN PELVIS W CONTRAST  Result Date: 07/02/2022 CLINICAL DATA:  Abdominal pain, acute nonlocalized. Recent admission for small bowel obstruction. History of metastatic rectal cancer. * Tracking Code: BO * EXAM: CT ABDOMEN AND PELVIS WITH CONTRAST TECHNIQUE: Multidetector CT imaging of the abdomen and pelvis was performed using the standard protocol following bolus administration of intravenous contrast. RADIATION DOSE REDUCTION: This exam was performed according to the departmental dose-optimization program which includes automated exposure control, adjustment of the mA and/or kV according to patient size and/or use of iterative reconstruction technique. CONTRAST:  170m OMNIPAQUE IOHEXOL 300 MG/ML  SOLN COMPARISON:  CT 05/08/2022 and 03/11/2022. FINDINGS: Lower chest: Clear lung bases. No significant pleural or pericardial effusion. Unchanged right pericardiac node measuring 8 mm on image 16/2. Hepatobiliary: Partially treated hepatic metastatic disease is similar to the recent study with the largest lesions measuring 10 mm superiorly in the right lobe (segment 7, image 14/2) and 10 mm inferiorly in the right lobe (segment 6, image 25/2). There are hepatic cysts as well. No new or enlarging hepatic lesions. No evidence of gallstones, gallbladder wall thickening or biliary dilatation. Pancreas: Unremarkable. No pancreatic ductal dilatation or surrounding inflammatory changes. Spleen: Normal in size without focal abnormality. Adrenals/Urinary Tract: Both adrenal glands appear normal. No evidence of urinary tract calculus, suspicious renal lesion or hydronephrosis. Stable small renal cysts which do not require any imaging follow-up. The bladder appears unremarkable for its degree of distention. Stomach/Bowel: No enteric contrast administered. The stomach and proximal small bowel appear unremarkable. Multiple moderately  dilated loops of small bowel are again noted within the left abdomen, measuring up to 4 cm in diameter. Focal transition point is noted in the periumbilical region with angulated bowel and wall thickening. The distal small bowel is decompressed. Appearance is similar to the recent prior study. The appendix appears normal. There is fluid in the proximal colon which is normal in caliber. The distal transverse colon and proximal descending colon are decompressed proximal to the left-sided loop colostomy. Persistent rectosigmoid circumferential wall thickening. Vascular/Lymphatic: There are no enlarged abdominal lymph nodes. Residual small perirectal lymph nodes are unchanged. No significant vascular findings. Reproductive: Hysterectomy.  No suspicious adnexal findings. Other: Small volume of ascites, similar to recent prior study. No peritoneal or omental nodularity identified. Musculoskeletal: Multiple predominately sclerotic osseous metastases are similar to the recent study. There are compression deformities involving the superior endplates of the TL38 L1 and L3 vertebral bodies. The lumbar compression deformities are stable. T10 was not imaged on the most recent studies, but this fracture appears new compared with a chest CT performed 02/18/2022. IMPRESSION: 1. Persistent partial mid  small bowel obstruction, similar to prior examination of 5 days earlier. As noted before, there is some small-bowel wall thickening at the level of the transition point, but no apparent adjacent mass lesion. 2. Unchanged ascites without evidence of progressive peritoneal disease. 3. Stable partially treated hepatic and osseous metastatic disease compared with recent prior studies. 4. T12 compression fracture appears new from 02/18/2022, not imaged on more recent abdominal studies. Electronically Signed   By: Richardean Sale M.D.   On: 07/02/2022 13:43   MR Brain W Wo Contrast  Result Date: 06/29/2022 CLINICAL DATA:  Rectal cancer,  evaluate for metastatic disease EXAM: MRI HEAD WITHOUT AND WITH CONTRAST TECHNIQUE: Multiplanar, multiecho pulse sequences of the brain and surrounding structures were obtained without and with intravenous contrast. CONTRAST:  60m MULTIHANCE GADOBENATE DIMEGLUMINE 529 MG/ML IV SOLN COMPARISON:  None Available. FINDINGS: Brain: No restricted diffusion to suggest acute or subacute infarct. No acute hemorrhage, mass, mass effect, or midline shift. No abnormal parenchymal or meningeal enhancement. Scattered T2 hyperintense signal in the periventricular white matter, likely the sequela of mild chronic small vessel ischemic disease. Vascular: Normal arterial flow voids. Normal arterial and venous enhancement. Skull and upper cervical spine: Numerous lesions concerning for osseous metastatic disease throughout the calvarium (for example series 14, image 117 and series 10, image 50). Many of these lesions demonstrate restricted diffusion (series 3, image 70 and series 4, image 31). Additional abnormal signal in the skull base (series 10, image 21), and imaged cervical spine (series 10, image 12 and series 16, image 18, for example). Sinuses/Orbits: No acute finding. Status post bilateral lens replacements. Other: Trace fluid in left mastoid air cells. IMPRESSION: 1. Findings concerning for osseous metastatic disease in the calvarium, skull base, and proximal cervical spine. 2. No evidence of metastatic disease in the brain. 3. No evidence of acute or subacute infarct. Electronically Signed   By: AMerilyn BabaM.D.   On: 06/29/2022 03:08   DG Abd Portable 1V-Small Bowel Obstruction Protocol-initial, 8 hr delay  Result Date: 06/27/2022 CLINICAL DATA:  SBO protocol, 8 hour delay image. History of colorectal cancer with colostomy. EXAM: PORTABLE ABDOMEN - 1 VIEW COMPARISON:  06/27/2022 at 0915 hours FINDINGS: Enteric tube terminates in the gastric cardia. Contrast in the stomach. Contrast opacifies the ascending,  transverse, and descending colon to the left mid abdomen. This likely corresponds to the site of the patient's left mid abdominal colostomy. Excretory contrast in the bladder. IMPRESSION: Contrast throughout the colon, likely to the level of the patient's left mid abdominal colostomy. Electronically Signed   By: SJulian HyM.D.   On: 06/27/2022 21:00   DG Chest 1 View  Result Date: 06/27/2022 CLINICAL DATA:  NG tube placement. EXAM: CHEST  1 VIEW COMPARISON:  03/12/2022 and abdominal film 06/27/2022 FINDINGS: Enteric tube present with tip and side-port over the stomach in the left upper quadrant just below the hemidiaphragm. Contrast is present within the NG tube and stomach. Right IJ Port-A-Cath unchanged. Lungs are adequately inflated without consolidation or effusion. Cardiomediastinal silhouette and remainder of the exam is unchanged. IMPRESSION: 1. No acute cardiopulmonary disease. 2. Enteric tube with tip and side-port over the stomach in the left upper quadrant. Electronically Signed   By: DMarin OlpM.D.   On: 06/27/2022 12:08   DG Abd Portable 1V-Small Bowel Protocol-Position Verification  Result Date: 06/27/2022 CLINICAL DATA:  NG tube placement. EXAM: PORTABLE ABDOMEN - 1 VIEW COMPARISON:  03/18/2022 FINDINGS: NG tube is present with tip and side-port  over the stomach in the left upper quadrant. Bowel gas pattern is nonobstructive. No free peritoneal air. Lung bases are clear. Remainder of the exam is unchanged. IMPRESSION: 1. Nonobstructive bowel gas pattern. 2. NG tube with tip and side-port over the stomach in the left upper quadrant. Electronically Signed   By: Marin Olp M.D.   On: 06/27/2022 09:27   CT ABDOMEN PELVIS W CONTRAST  Result Date: 06/27/2022 CLINICAL DATA:  Acute abdominal pain with history of colorectal cancer and colostomy. EXAM: CT ABDOMEN AND PELVIS WITH CONTRAST TECHNIQUE: Multidetector CT imaging of the abdomen and pelvis was performed using the standard  protocol following bolus administration of intravenous contrast. RADIATION DOSE REDUCTION: This exam was performed according to the departmental dose-optimization program which includes automated exposure control, adjustment of the mA and/or kV according to patient size and/or use of iterative reconstruction technique. CONTRAST:  158m OMNIPAQUE IOHEXOL 300 MG/ML  SOLN COMPARISON:  CT with oral contrast only 05/08/2022, CTs without contrast 03/11/2022 and 02/08/2022. FINDINGS: Lower chest: Lung bases are clear apart from minimal posterior atelectasis. Epicardial lymph nodes to the right are again noted up to 8.6 mm in short axis but have not increased in size. There are bilateral breast implants partially visible. Hepatobiliary: In the lateral liver dome on 2:12 there is a hypodense lesion compatible with metastasis measuring 1.6 cm in segment 7. This was larger previously, particularly on 03/11/2022 when it measured 2.4 cm. On axial images 22 and 23 there are additional hypodense lesions inferiorly in segment 6, both approaching 1 cm which were also previously about double the size. There are few scattered hypodensities which are too small to characterize and a small cyst in the left lobe anteriorly. There are small stones in the gallbladder but no wall thickening or biliary dilatation. Pancreas: Unremarkable. Spleen: Unremarkable. Adrenals/Urinary Tract: There is no adrenal mass. In the lower pole right kidney there is a 1.6 cm cyst of 23 Hounsfield units. No follow-up imaging is required. Both kidneys otherwise enhance normally. There is no urinary stone or obstruction. No bladder thickening. Stomach/Bowel: Output of the distal descending colon through a left mid abdominal diverting loop colostomy. Thickened folds are chronically noted in the jejunum. Beginning at the ligament of Treitz there is small bowel dilatation in the left upper to mid and lower abdomen with small bowel segments up to 4.1 cm. The  transitional segment is a sharply angulated loop anteriorly in the mid to lower abdomen best seen on axial images 42-45 of series 2, with thickened folds at the transition. The rest of the small bowel is collapsed in the lower abdomen and upper pelvis. The appendix is normal. Large bowel unremarkable except at the rectosigmoid junction where circumferential wall thickening continues to be seen with perirectal stranding. Presacral stranding and mild layering presacral fluid also continue to be noted. There is increased scattered ascites along the mesenteric folds in the mid to lower abdomen as well. There is no bowel pneumatosis or portal venous gas. Vascular/Lymphatic: No significant vascular findings. Left perirectal lymph nodes are again noted up to 8.2 mm in short axis, slightly improved. No adenopathy is seen elsewhere in the pelvis or in the abdomen. Reproductive: Status post hysterectomy. No adnexal masses. Other: As above there is scattered ascites in the abdomen and pelvis. There is no free hemorrhage, abscess or free air. Musculoskeletal: Bony sclerosis and mild-to-moderate pathologic compression fractures are again noted of L1 and 3. Lytic metastasis in the posterior L5 body measuring 1.8 cm is  less lucent than previously consistent with a treated metastasis. Mixed attenuation sclerosis and lucency is redemonstrated in the left pubic bone with a new lytic lesion measuring 1.9 cm in the left superior pubic ramus on 2:72. Also, a sclerotic lesion in the right hemisacrum on 2:51 now measures 1.2 cm and was previously 7 mm, with similar interval enlargement of a left sacral sclerotic lesion measuring 2 cm on 2:54, previously 1.5 cm. There are probably other lesions in multiple locations which are too small to characterize. The bones in general appears subtly denser than previously. IMPRESSION: 1. High-grade small-bowel obstruction transitioning in the anterior mid to lower abdomen in the proximal ileum, with a  sharply angulated transitional segment with surrounding mesenteric edema. Etiology is most likely adhesive disease. Wall thickening of the transitional segment could be related to enteritis or ischemia but there is no pneumatosis. 2. Increased scattered ascites in the abdomen and pelvis, mild amount. 3. Diverting loop colostomy left lower abdomen with similar appearance of rectosigmoid junction thickening, adjacent stranding and adjacent presacral fluid and soft tissue thickening. 4. Left perirectal lymph nodes stable to slightly improved since 05/08/2022 but significantly smaller than on the earlier studies. 5. Shrinking hepatic metastases, but at the same time evidence of progression in osseous metastatic disease with a new lesion in the left superior pubic ramus and enlarging lesions in the sacrum. 6. Cholelithiasis. 7. Chronic thickened folds in the jejunum. Electronically Signed   By: Telford Nab M.D.   On: 06/27/2022 02:26    Microbiology: No results found for this or any previous visit (from the past 240 hour(s)).   Labs: Basic Metabolic Panel: Recent Labs  Lab 06/29/22 0354 07/02/22 1059 07/03/22 0434 07/04/22 0530 07/04/22 0751 07/05/22 0500 07/05/22 0800  NA 139 136 140 138  --  135  --   K 3.3* 3.7 3.2* 3.1*  --  3.8  --   CL 109 102 109 109  --  108  --   CO2 '26 25 26 27  '$ --  25  --   GLUCOSE 85 131* 82 87  --  123*  --   BUN <5* 6* 7* <5*  --  <5*  --   CREATININE 0.36* 0.49 0.34* 0.37*  --  <0.30*  --   CALCIUM 7.6* 8.7* 7.4* 7.6*  --  7.6*  --   MG  --   --   --   --  2.0  --  1.7   Liver Function Tests: Recent Labs  Lab 07/02/22 1059 07/03/22 0434 07/04/22 0530  AST 34 22 23  ALT 38 26 25  ALKPHOS 265* 202* 195*  BILITOT 0.8 0.6 0.5  PROT 5.5* 4.2* 3.8*  ALBUMIN 3.1* 2.2* 2.0*   Recent Labs  Lab 07/02/22 1059  LIPASE 20   No results for input(s): "AMMONIA" in the last 168 hours. CBC: Recent Labs  Lab 06/30/22 1449 07/02/22 1059 07/03/22 0434  07/04/22 0530 07/05/22 0800  WBC 4.3 2.9* 1.8* 4.8 9.5  NEUTROABS 3.5 1.3*  --   --   --   HGB 9.6* 11.4* 8.4* 7.9* 9.3*  HCT 28.9* 34.5* 26.0* 24.6* 28.6*  MCV 93.5 95.0 97.4 98.0 97.3  PLT 35* 58* 49* 50* 86*   Cardiac Enzymes: No results for input(s): "CKTOTAL", "CKMB", "CKMBINDEX", "TROPONINI" in the last 168 hours. BNP: BNP (last 3 results) No results for input(s): "BNP" in the last 8760 hours.  ProBNP (last 3 results) No results for input(s): "PROBNP" in the last  8760 hours.  CBG: No results for input(s): "GLUCAP" in the last 168 hours.     Signed:  Nita Sells MD   [facility] 07/05/2022, 1:26 PM

## 2022-07-06 ENCOUNTER — Ambulatory Visit: Payer: BC Managed Care – PPO | Admitting: Radiation Oncology

## 2022-07-07 ENCOUNTER — Inpatient Hospital Stay (HOSPITAL_BASED_OUTPATIENT_CLINIC_OR_DEPARTMENT_OTHER): Payer: BC Managed Care – PPO | Admitting: Nurse Practitioner

## 2022-07-07 ENCOUNTER — Inpatient Hospital Stay: Payer: BC Managed Care – PPO

## 2022-07-07 ENCOUNTER — Ambulatory Visit: Payer: BC Managed Care – PPO | Admitting: Radiation Oncology

## 2022-07-07 ENCOUNTER — Encounter: Payer: Self-pay | Admitting: *Deleted

## 2022-07-07 ENCOUNTER — Encounter: Payer: Self-pay | Admitting: Nurse Practitioner

## 2022-07-07 VITALS — BP 114/58 | HR 84 | Temp 98.1°F | Resp 20 | Ht 66.0 in | Wt 124.0 lb

## 2022-07-07 DIAGNOSIS — C20 Malignant neoplasm of rectum: Secondary | ICD-10-CM

## 2022-07-07 DIAGNOSIS — Z933 Colostomy status: Secondary | ICD-10-CM | POA: Diagnosis not present

## 2022-07-07 DIAGNOSIS — D701 Agranulocytosis secondary to cancer chemotherapy: Secondary | ICD-10-CM | POA: Diagnosis not present

## 2022-07-07 DIAGNOSIS — T451X5D Adverse effect of antineoplastic and immunosuppressive drugs, subsequent encounter: Secondary | ICD-10-CM | POA: Diagnosis not present

## 2022-07-07 DIAGNOSIS — E7800 Pure hypercholesterolemia, unspecified: Secondary | ICD-10-CM | POA: Diagnosis not present

## 2022-07-07 DIAGNOSIS — D61810 Antineoplastic chemotherapy induced pancytopenia: Secondary | ICD-10-CM | POA: Diagnosis not present

## 2022-07-07 DIAGNOSIS — Z5111 Encounter for antineoplastic chemotherapy: Secondary | ICD-10-CM | POA: Diagnosis present

## 2022-07-07 LAB — CMP (CANCER CENTER ONLY)
ALT: 19 U/L (ref 0–44)
AST: 26 U/L (ref 15–41)
Albumin: 3 g/dL — ABNORMAL LOW (ref 3.5–5.0)
Alkaline Phosphatase: 286 U/L — ABNORMAL HIGH (ref 38–126)
Anion gap: 10 (ref 5–15)
BUN: <5 mg/dL — ABNORMAL LOW (ref 8–23)
CO2: 27 mmol/L (ref 22–32)
Calcium: 7.8 mg/dL — ABNORMAL LOW (ref 8.9–10.3)
Chloride: 103 mmol/L (ref 98–111)
Creatinine: 0.41 mg/dL — ABNORMAL LOW (ref 0.44–1.00)
GFR, Estimated: >60 mL/min (ref >60)
Glucose, Bld: 95 mg/dL (ref 70–99)
Potassium: 3.4 mmol/L — ABNORMAL LOW (ref 3.5–5.1)
Sodium: 140 mmol/L (ref 135–145)
Total Bilirubin: 0.4 mg/dL (ref 0.3–1.2)
Total Protein: 4.6 g/dL — ABNORMAL LOW (ref 6.5–8.1)

## 2022-07-07 LAB — CBC WITH DIFFERENTIAL (CANCER CENTER ONLY)
Abs Immature Granulocytes: 0.2 10*3/uL — ABNORMAL HIGH (ref 0.00–0.07)
Band Neutrophils: 9 %
Basophils Absolute: 0 10*3/uL (ref 0.0–0.1)
Basophils Relative: 0 %
Eosinophils Absolute: 0 10*3/uL (ref 0.0–0.5)
Eosinophils Relative: 0 %
HCT: 28.2 % — ABNORMAL LOW (ref 36.0–46.0)
Hemoglobin: 9.3 g/dL — ABNORMAL LOW (ref 12.0–15.0)
Lymphocytes Relative: 3 %
Lymphs Abs: 0.2 10*3/uL — ABNORMAL LOW (ref 0.7–4.0)
MCH: 31.3 pg (ref 26.0–34.0)
MCHC: 33 g/dL (ref 30.0–36.0)
MCV: 94.9 fL (ref 80.0–100.0)
Metamyelocytes Relative: 2 %
Monocytes Absolute: 0.3 10*3/uL (ref 0.1–1.0)
Monocytes Relative: 4 %
Neutro Abs: 7.6 10*3/uL (ref 1.7–7.7)
Neutrophils Relative %: 82 %
Platelet Count: 115 10*3/uL — ABNORMAL LOW (ref 150–400)
RBC: 2.97 MIL/uL — ABNORMAL LOW (ref 3.87–5.11)
RDW: 18 % — ABNORMAL HIGH (ref 11.5–15.5)
Smear Review: NORMAL
WBC Count: 8.3 10*3/uL (ref 4.0–10.5)
nRBC: 0.4 % — ABNORMAL HIGH (ref 0.0–0.2)
nRBC: 1 /100 WBC — ABNORMAL HIGH

## 2022-07-07 MED ORDER — SODIUM CHLORIDE 0.9% FLUSH
10.0000 mL | Freq: Once | INTRAVENOUS | Status: AC
  Administered 2022-07-07: 10 mL via INTRAVENOUS

## 2022-07-07 MED ORDER — HEPARIN SOD (PORK) LOCK FLUSH 100 UNIT/ML IV SOLN
500.0000 [IU] | Freq: Once | INTRAVENOUS | Status: AC
  Administered 2022-07-07: 500 [IU] via INTRAVENOUS

## 2022-07-07 NOTE — Progress Notes (Signed)
Referral placed with [facility], Appt for 9/27 at 31 and 1100, Ms Galanti will be seeing Dr Dennison Nancy and Dr Ronita Hipps

## 2022-07-07 NOTE — Progress Notes (Signed)
[place] OFFICE PROGRESS NOTE   Diagnosis: Rectal cancer  INTERVAL HISTORY:   Karen Kaufman returns as scheduled.  She completed cycle 6 FOLFOX 06/23/2022.  She was hospitalized on 06/26/2022 through 06/29/2022 with a small bowel obstruction.  CT showed high-grade small bowel obstruction transitioning in the anterior mid to lower abdomen in the proximal ileum with a sharply angulated transitional segment with surrounding mesenteric edema.  Mild amount of increased scattered ascites in the abdomen and pelvis; similar appearance of rectosigmoid junction thickening, adjacent stranding and adjacent presacral fluid and soft tissue thickening; left perirectal lymph node stable to slightly improved; shrinking hepatic metastases; evidence of progression in osseous metastatic disease with a new lesion in the left superior pubic ramus and enlarging lesions in the sacrum.  This was managed conservatively.  Symptoms improved and she was discharged home.    She completed SBRT to T12-L4 on 07/01/2022.    She was readmitted 07/02/2022 with abdominal pain.  CT abdomen/pelvis showed persistent partial mid small bowel obstruction similar to prior examination 5 days earlier.  Unchanged ascites without evidence of progressive peritoneal disease.  Stable partially treated hepatic and osseous metastatic disease; T12 compression fracture appears new from 02/18/2022.  She has decided to discontinue further radiation.  Abdominal pain has resolved.  Ostomy is functioning.  No nausea or vomiting.  She has mild persistent cold sensitivity.  No numbness or tingling in the absence of cold exposure.  She denies significant rectal pain.  She notes a mucousy like rectal discharge and occasionally notes blood mixed with this.  Objective:  Vital signs in last 24 hours:  Blood pressure (!) 114/58, pulse 84, temperature 98.1 F (36.7 C), temperature source Oral, resp. rate 20, height _0  (1.676 m), weight 124 lb (56.2 kg),  SpO2 100 %.    HEENT: No thrush or ulcers. Resp: Lungs clear bilaterally. Cardio: Regular rate and rhythm. GI: No hepatomegaly.  Left lower quadrant colostomy with soft stool in the collection bag. Vascular: No leg edema. Neuro: Moderate decrease in vibratory sense over the fingertips per tuning fork exam. Skin: Palms without erythema. Port-A-Cath without erythema.   Lab Results:  Lab Results  Component Value Date   WBC 8.3 07/07/2022   HGB 9.3 (L) 07/07/2022   HCT 28.2 (L) 07/07/2022   MCV 94.9 07/07/2022   PLT 115 (L) 07/07/2022   NEUTROABS 7.6 07/07/2022    Imaging:  No results found.  Medications: I have reviewed the patient's current medications.  Assessment/Plan: Colorectal cancer 02/08/2022 CT abdomen/pelvis without contrast-severe circumferential wall thickening of the distal sigmoid colon and rectum; enlarged perirectal and low left retroperitoneal lymph nodes; multiple ill-defined hypodense liver lesions; trace ascites in the low pelvis.  02/08/2022 Colonoscopy by Dr. Charlyne Mom almost completely obstructing medium-sized mass in the rectosigmoid colon measuring from 12 to 17 cm.  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  Foundation 1-microsatellite stable, tumor mutation burden 5, BRAFV600E, K-ras wild-type 02/09/2022 CEA 3.2 CT chest 02/18/2022-chest adenopathy.  Multiple hypodense liver lesions. 02/20/2022 biopsy of liver lesion-fragments of benign liver parenchyma with reactive changes.  No evidence of malignancy. MRI abdomen 03/01/2022-numerous hypovascular hepatic lesions having imaging characteristics consider diagnostic of widespread metastatic disease to the liver.  Retroperitoneal lymphadenopathy concerning for metastatic disease. Cycle 1 FOLFOX 03/02/2022 03/11/2022 CTs abdomen/pelvis-distal large bowel obstruction at the site of a large annular mass at the rectosigmoid junction.  Progressive liver metastases. Compared to 4/23  CT. Progressive perirectal, left periaortic and right pericardiophrenic nodal metastases.  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 Cycle 2 FOLFOX 03/31/2022 Cycle 3 FOLFOX 04/13/2022 Cycle 4 FOLFOX delayed 04/28/2022 secondary to neutropenia and thrombocytopenia MRI lumbar spine 05/07/2022-pathologic compression fractures at L1 and L3, multiple additional hypointense lesions concerning for metastatic disease CT abdomen/pelvis 05/08/2022-decrease in size of liver metastases and pelvic/retroperitoneal adenopathy, stable prepericardial/juxta diaphragmatic nodes, new L5 metastasis, L1 and L3 compression fractures, L1 compression fracture present on 03/11/2022 CT Lumbar spine radiation 05/11/2022 - 05/25/2022, 3000 cGy in 10 fractions Cycle 4 FOLFOX 05/26/2022, Udenyca Cycle 5 FOLFOX 06/10/2022, Udenyca Cycle 6 FOLFOX 06/23/2022, Udenyca, oxaliplatin dose reduced due to thrombocytopenia, dexamethasone premedication decreased to 5 mg per patient request MRI brain 06/26/2022-findings concerning for osseous metastatic disease in the calvarium, skull base and proximal cervical spine.  No evidence of metastatic disease in the brain.   CT abdomen/pelvis 06/27/2022-high-grade small bowel obstruction transitioning in the anterior mid to lower abdomen in the proximal ileum; increased scattered ascites in the abdomen and pelvis, mild; similar appearance of rectosigmoid junction thickening; left perirectal lymph node stable to slightly improved since 05/08/2022, significantly smaller than on earlier studies; improved hepatic metastases; evidence of progression in osseous metastatic disease with a new lesion in the left superior pubic ramus and enlarging lesions in the sacrum CT abdomen/pelvis 07/02/2022 persistent partial small bowel obstruction similar to prior examination 5 days earlier; unchanged ascites; stable partially treated hepatic and osseous metastatic disease; T12 compression fracture appears new from  02/18/2022. Rectal bleeding, lower abdominal pain, constipation secondary to #1 Multiple family members with cancer Hypercholesterolemia Port-A-Cath placement 02/20/2022 Hospital admission 03/11/2022-colonic obstruction secondary to colon cancer. 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  She has now completed 6 cycles of FOLFOX.  She has had 2 recent hospitalizations with small bowel obstruction.  Recent CT scans show improvement in the liver and probable progression in the bones.  These results were reviewed with Karen Kaufman and her husband at today's visit.  We reviewed her diagnosis of metastatic rectal cancer and that no therapy will be curative.  Dr. Benay Spice discussed treatment options to include encorafenib/Panitumumab (she has a BRAF mutation) and FOLFIRI.  She is interested in a second opinion as well as clinical trial availability at [facility].  She would also like to talk with a colorectal surgeon.  We made a referral to medical oncology at [facility] and noted she would also like an appointment with Dr. Ronita Hipps to discuss the potential for rectal surgery.  She understands rectal surgery in her case is unlikely to be recommended.  She is scheduled for follow-up here in 2 weeks.  We are available to see her sooner if needed.  Patient seen with Dr. Benay Spice.    Ned Card ANP/GNP-BC   07/07/2022  12:53 PM  This was a shared visit with Ned Card.  Karen Kaufman was interviewed and examined.  She has been admitted twice over the past few weeks with a small bowel obstruction.  The obstruction may be related to adhesions or potentially carcinomatosis.  CTs during the recent hospital admission reveal improvement in liver metastases and possible progression of metastatic disease involving the bones.  An MRI of the brain revealed evidence of bone metastases.  I explained that bone metastases  can sometimes become more apparent on imaging when they develop sclerosis with treatment.  She appears to have responded to the FOLFOX with regard to the liver metastases.  I suspect she has  developed some progression in the bones with new lesions.  I discussed treatment options with Karen Kaufman and her husband.  I explained no therapy will be curative.  The tumor harbors a BRAF mutation.  This predicts a poor prognosis and aggressive behavior.  They ask about the indication for rectal surgery.  I do not recommend resection of the primary tumor as it is currently not causing symptoms.  Her lifespan will be more limited by the systemic tumor burden.  I recommend discontinuing FOLFOX as she has completed 6 cycles of treatment with partial clinical improvement and is developing significant neuropathy.  We discussed salvage treatment options including FOLFIRI and Encorafenib/panitumumab.  She has not received bevacizumab secondary to the diverting colostomy in May and potential need for additional surgical management of recurrent small bowel obstruction.  I recommend changing treatment to Encorafenib/panitumumab.  Karen Kaufman request a second opinion at [facility].  We will refer her for medical and surgical oncology evaluation.  I was present for greater than 50% of today's visit.  I performed medical decision making.  Julieanne Manson, MD

## 2022-07-08 ENCOUNTER — Telehealth: Payer: Self-pay

## 2022-07-08 ENCOUNTER — Other Ambulatory Visit: Payer: Self-pay

## 2022-07-08 ENCOUNTER — Inpatient Hospital Stay: Payer: BC Managed Care – PPO

## 2022-07-08 ENCOUNTER — Ambulatory Visit: Payer: BC Managed Care – PPO | Admitting: Radiation Oncology

## 2022-07-08 NOTE — Telephone Encounter (Signed)
RN called patient to discuss today's treatment and she expressed that she will no longer be coming in for treatment.  She stated that she feels its not the best course of tratment for her.  She stated after the first treatment she had a bowel obstruction and is now hurting and that she was canceling all treatments.

## 2022-07-09 ENCOUNTER — Other Ambulatory Visit: Payer: Self-pay | Admitting: *Deleted

## 2022-07-09 ENCOUNTER — Encounter: Payer: Self-pay | Admitting: *Deleted

## 2022-07-09 ENCOUNTER — Ambulatory Visit: Payer: Self-pay | Admitting: Urology

## 2022-07-09 ENCOUNTER — Other Ambulatory Visit: Payer: Self-pay | Admitting: Oncology

## 2022-07-09 DIAGNOSIS — C20 Malignant neoplasm of rectum: Secondary | ICD-10-CM

## 2022-07-09 NOTE — Telephone Encounter (Signed)
No longer on this

## 2022-07-09 NOTE — Progress Notes (Signed)
Received refill request for pantoprazole and patient is on famotidine bid. Called patient and she prefers the bid famotidine and did not request the refill. Refill refused. She reports the swelling in her feet is not getting better and asking what to do? Suggested pushing oral water, avoid salt, elevate and try support hose.

## 2022-07-10 ENCOUNTER — Encounter: Payer: Self-pay | Admitting: Family Medicine

## 2022-07-10 ENCOUNTER — Ambulatory Visit (INDEPENDENT_AMBULATORY_CARE_PROVIDER_SITE_OTHER): Payer: BC Managed Care – PPO | Admitting: Family Medicine

## 2022-07-10 ENCOUNTER — Inpatient Hospital Stay: Payer: BC Managed Care – PPO

## 2022-07-10 ENCOUNTER — Ambulatory Visit: Payer: BC Managed Care – PPO | Admitting: Radiation Oncology

## 2022-07-10 ENCOUNTER — Ambulatory Visit: Payer: BC Managed Care – PPO

## 2022-07-10 VITALS — BP 98/64 | HR 89 | Temp 98.1°F | Ht 66.0 in | Wt 119.2 lb

## 2022-07-10 DIAGNOSIS — Z933 Colostomy status: Secondary | ICD-10-CM | POA: Diagnosis not present

## 2022-07-10 DIAGNOSIS — C20 Malignant neoplasm of rectum: Secondary | ICD-10-CM | POA: Diagnosis not present

## 2022-07-10 DIAGNOSIS — K56609 Unspecified intestinal obstruction, unspecified as to partial versus complete obstruction: Secondary | ICD-10-CM | POA: Diagnosis not present

## 2022-07-10 DIAGNOSIS — E876 Hypokalemia: Secondary | ICD-10-CM | POA: Diagnosis not present

## 2022-07-10 DIAGNOSIS — Z09 Encounter for follow-up examination after completed treatment for conditions other than malignant neoplasm: Secondary | ICD-10-CM

## 2022-07-10 LAB — CMP14+EGFR
ALT: 22 IU/L (ref 0–32)
AST: 40 IU/L (ref 0–40)
Albumin/Globulin Ratio: 2 (ref 1.2–2.2)
Albumin: 3 g/dL — ABNORMAL LOW (ref 3.9–4.9)
Alkaline Phosphatase: 381 IU/L — ABNORMAL HIGH (ref 44–121)
BUN/Creatinine Ratio: 7 — ABNORMAL LOW (ref 12–28)
BUN: 3 mg/dL — ABNORMAL LOW (ref 8–27)
Bilirubin Total: 0.2 mg/dL (ref 0.0–1.2)
CO2: 24 mmol/L (ref 20–29)
Calcium: 8.4 mg/dL — ABNORMAL LOW (ref 8.7–10.3)
Chloride: 104 mmol/L (ref 96–106)
Creatinine, Ser: 0.42 mg/dL — ABNORMAL LOW (ref 0.57–1.00)
Globulin, Total: 1.5 g/dL (ref 1.5–4.5)
Glucose: 79 mg/dL (ref 70–99)
Potassium: 4 mmol/L (ref 3.5–5.2)
Sodium: 142 mmol/L (ref 134–144)
Total Protein: 4.5 g/dL — ABNORMAL LOW (ref 6.0–8.5)
eGFR: 111 mL/min/{1.73_m2} (ref >59)

## 2022-07-10 LAB — CBC WITH DIFFERENTIAL/PLATELET
Basophils Absolute: 0 10*3/uL (ref 0.0–0.2)
Basos: 0 %
EOS (ABSOLUTE): 0 10*3/uL (ref 0.0–0.4)
Eos: 1 %
Hematocrit: 30.3 % — ABNORMAL LOW (ref 34.0–46.6)
Hemoglobin: 10.2 g/dL — ABNORMAL LOW (ref 11.1–15.9)
Immature Grans (Abs): 0.1 10*3/uL (ref 0.0–0.1)
Immature Granulocytes: 1 %
Lymphocytes Absolute: 0.6 10*3/uL — ABNORMAL LOW (ref 0.7–3.1)
Lymphs: 7 %
MCH: 31.5 pg (ref 26.6–33.0)
MCHC: 33.7 g/dL (ref 31.5–35.7)
MCV: 94 fL (ref 79–97)
Monocytes Absolute: 0.7 10*3/uL (ref 0.1–0.9)
Monocytes: 9 %
Neutrophils Absolute: 6.5 10*3/uL (ref 1.4–7.0)
Neutrophils: 82 %
Platelets: 210 10*3/uL (ref 150–450)
RBC: 3.24 x10E6/uL — ABNORMAL LOW (ref 3.77–5.28)
RDW: 17.2 % — ABNORMAL HIGH (ref 11.7–15.4)
WBC: 8 10*3/uL (ref 3.4–10.8)

## 2022-07-10 NOTE — Progress Notes (Unsigned)
   Established Patient Office Visit  Subjective   Patient ID: Karen Kaufman, female    DOB: 1961-02-14  Age: 61 y.o. MRN: [id number]  Chief Complaint  Patient presents with  . Hospitalization Follow-up    HPI  {History (Optional):23778}  ROS    Objective:     BP 98/64   Pulse 89   Temp 98.1 F (36.7 C) (Temporal)   Ht '5\' 6"'$  (1.676 m)   Wt 119 lb 4 oz (54.1 kg)   SpO2 95%   BMI 19.25 kg/m  {Vitals History (Optional):23777}  Physical Exam   No results found for any visits on 07/10/22.  {Labs (Optional):23779}  The 10-year ASCVD risk score (Arnett DK, et al., 2019) is: 2.1%    Assessment & Plan:   Problem List Items Addressed This Visit   None   No follow-ups on file.    Gwenlyn Perking, FNP

## 2022-07-14 ENCOUNTER — Telehealth: Payer: Self-pay

## 2022-07-14 ENCOUNTER — Ambulatory Visit: Payer: BC Managed Care – PPO | Admitting: Radiation Oncology

## 2022-07-14 ENCOUNTER — Other Ambulatory Visit: Payer: Self-pay | Admitting: Hematology and Oncology

## 2022-07-14 NOTE — Telephone Encounter (Signed)
Faxed over foundation one result to [facility] oncology fax # [phone] att;Franchot Gallo.

## 2022-07-16 ENCOUNTER — Ambulatory Visit: Payer: BC Managed Care – PPO | Admitting: Radiation Oncology

## 2022-07-17 ENCOUNTER — Other Ambulatory Visit: Payer: Self-pay | Admitting: Nurse Practitioner

## 2022-07-17 ENCOUNTER — Telehealth: Payer: Self-pay

## 2022-07-17 DIAGNOSIS — C7951 Secondary malignant neoplasm of bone: Secondary | ICD-10-CM

## 2022-07-17 MED ORDER — GABAPENTIN 300 MG PO CAPS: 300.0000 mg | ORAL_CAPSULE | Freq: Two times a day (BID) | ORAL | 0 refills | Status: DC

## 2022-07-17 MED ORDER — OXYCODONE HCL 5 MG PO TABS: 5.0000 mg | ORAL_TABLET | Freq: Four times a day (QID) | ORAL | 0 refills | Status: DC | PRN

## 2022-07-17 NOTE — Telephone Encounter (Signed)
Karen Kaufman called and complained she's having shooting pain in her left leg. The pain started yesterday, her left leg is very weak. Denies any fever/chills, numbness in the leg, or problem with urinary. She also stated she is taking her pain medication, and the pain medication is not working. She want to try gabapentin, no steroids.

## 2022-07-17 NOTE — Telephone Encounter (Signed)
I called Karen Kaufman to let her know the provider sent in Oxy IR  5 mg and gabapentin 300 mg, also no driving when taking oxy IR /gabapentin medication. Patient gave verbal understanding and had no further questions or concerns at this time

## 2022-07-21 ENCOUNTER — Other Ambulatory Visit (HOSPITAL_COMMUNITY): Payer: Self-pay

## 2022-07-21 ENCOUNTER — Telehealth: Payer: Self-pay

## 2022-07-21 ENCOUNTER — Encounter: Payer: Self-pay | Admitting: Oncology

## 2022-07-21 ENCOUNTER — Inpatient Hospital Stay (HOSPITAL_BASED_OUTPATIENT_CLINIC_OR_DEPARTMENT_OTHER): Payer: BC Managed Care – PPO | Admitting: Oncology

## 2022-07-21 ENCOUNTER — Inpatient Hospital Stay: Payer: BC Managed Care – PPO | Attending: Nurse Practitioner

## 2022-07-21 ENCOUNTER — Inpatient Hospital Stay: Payer: BC Managed Care – PPO

## 2022-07-21 ENCOUNTER — Telehealth: Payer: Self-pay | Admitting: Pharmacist

## 2022-07-21 VITALS — BP 107/66 | HR 84 | Temp 98.1°F | Resp 18 | Ht 66.0 in | Wt 120.0 lb

## 2022-07-21 DIAGNOSIS — C7951 Secondary malignant neoplasm of bone: Secondary | ICD-10-CM | POA: Diagnosis not present

## 2022-07-21 DIAGNOSIS — Z5112 Encounter for antineoplastic immunotherapy: Secondary | ICD-10-CM | POA: Insufficient documentation

## 2022-07-21 DIAGNOSIS — C20 Malignant neoplasm of rectum: Secondary | ICD-10-CM

## 2022-07-21 DIAGNOSIS — Z933 Colostomy status: Secondary | ICD-10-CM | POA: Diagnosis not present

## 2022-07-21 DIAGNOSIS — Z5111 Encounter for antineoplastic chemotherapy: Secondary | ICD-10-CM | POA: Diagnosis present

## 2022-07-21 DIAGNOSIS — T451X5D Adverse effect of antineoplastic and immunosuppressive drugs, subsequent encounter: Secondary | ICD-10-CM | POA: Insufficient documentation

## 2022-07-21 DIAGNOSIS — D61810 Antineoplastic chemotherapy induced pancytopenia: Secondary | ICD-10-CM | POA: Insufficient documentation

## 2022-07-21 DIAGNOSIS — Z95828 Presence of other vascular implants and grafts: Secondary | ICD-10-CM | POA: Diagnosis not present

## 2022-07-21 DIAGNOSIS — E7800 Pure hypercholesterolemia, unspecified: Secondary | ICD-10-CM | POA: Insufficient documentation

## 2022-07-21 DIAGNOSIS — D701 Agranulocytosis secondary to cancer chemotherapy: Secondary | ICD-10-CM | POA: Diagnosis not present

## 2022-07-21 LAB — CMP (CANCER CENTER ONLY)
ALT: 11 U/L (ref 0–44)
AST: 24 U/L (ref 15–41)
Albumin: 3.1 g/dL — ABNORMAL LOW (ref 3.5–5.0)
Alkaline Phosphatase: 255 U/L — ABNORMAL HIGH (ref 38–126)
Anion gap: 10 (ref 5–15)
BUN: <5 mg/dL — ABNORMAL LOW (ref 8–23)
CO2: 29 mmol/L (ref 22–32)
Calcium: 8.5 mg/dL — ABNORMAL LOW (ref 8.9–10.3)
Chloride: 100 mmol/L (ref 98–111)
Creatinine: 0.41 mg/dL — ABNORMAL LOW (ref 0.44–1.00)
GFR, Estimated: >60 mL/min (ref >60)
Glucose, Bld: 97 mg/dL (ref 70–99)
Potassium: 3.9 mmol/L (ref 3.5–5.1)
Sodium: 139 mmol/L (ref 135–145)
Total Bilirubin: 0.5 mg/dL (ref 0.3–1.2)
Total Protein: 5.4 g/dL — ABNORMAL LOW (ref 6.5–8.1)

## 2022-07-21 LAB — CBC WITH DIFFERENTIAL (CANCER CENTER ONLY)
Abs Immature Granulocytes: 0.07 10*3/uL (ref 0.00–0.07)
Basophils Absolute: 0.1 10*3/uL (ref 0.0–0.1)
Basophils Relative: 1 %
Eosinophils Absolute: 0.1 10*3/uL (ref 0.0–0.5)
Eosinophils Relative: 2 %
HCT: 28.4 % — ABNORMAL LOW (ref 36.0–46.0)
Hemoglobin: 9.2 g/dL — ABNORMAL LOW (ref 12.0–15.0)
Immature Granulocytes: 1 %
Lymphocytes Relative: 6 %
Lymphs Abs: 0.4 10*3/uL — ABNORMAL LOW (ref 0.7–4.0)
MCH: 31.5 pg (ref 26.0–34.0)
MCHC: 32.4 g/dL (ref 30.0–36.0)
MCV: 97.3 fL (ref 80.0–100.0)
Monocytes Absolute: 0.7 10*3/uL (ref 0.1–1.0)
Monocytes Relative: 10 %
Neutro Abs: 5.7 10*3/uL (ref 1.7–7.7)
Neutrophils Relative %: 80 %
Platelet Count: 118 10*3/uL — ABNORMAL LOW (ref 150–400)
RBC: 2.92 MIL/uL — ABNORMAL LOW (ref 3.87–5.11)
RDW: 17.4 % — ABNORMAL HIGH (ref 11.5–15.5)
WBC Count: 7.1 10*3/uL (ref 4.0–10.5)
nRBC: 0 % (ref 0.0–0.2)

## 2022-07-21 LAB — CEA (ACCESS): CEA (CHCC): 11.25 ng/mL — ABNORMAL HIGH (ref 0.00–5.00)

## 2022-07-21 MED ORDER — HEPARIN SOD (PORK) LOCK FLUSH 100 UNIT/ML IV SOLN
500.0000 [IU] | Freq: Once | INTRAVENOUS | Status: AC
  Administered 2022-07-21: 500 [IU] via INTRAVENOUS

## 2022-07-21 MED ORDER — SODIUM CHLORIDE 0.9% FLUSH
10.0000 mL | INTRAVENOUS | Status: DC | PRN
  Administered 2022-07-21: 10 mL via INTRAVENOUS

## 2022-07-21 MED ORDER — DOXYCYCLINE HYCLATE 100 MG PO TABS: 100.0000 mg | ORAL_TABLET | Freq: Two times a day (BID) | ORAL | 3 refills | Status: DC

## 2022-07-21 MED ORDER — ENCORAFENIB 75 MG PO CAPS
300.0000 mg | ORAL_CAPSULE | Freq: Every day | ORAL | 0 refills | Status: DC
  Filled 2022-07-21: qty 120, 30d supply, fill #0
  Filled 2022-07-21: qty 180, 45d supply, fill #0

## 2022-07-21 MED ORDER — OXYCODONE HCL 5 MG PO TABS: 5.0000 mg | ORAL_TABLET | Freq: Four times a day (QID) | ORAL | 0 refills | Status: DC | PRN

## 2022-07-21 NOTE — Telephone Encounter (Signed)
Oral Oncology Patient Advocate Encounter  Prior Authorization for Karen Kaufman has been approved.    PA# [id number]  Effective dates: 10.03.23 through 10.02.24  Patients co-pay is $0.00.    Berdine Addison, [facility] Patient [place]  [phone] (phone) [phone] (fax)

## 2022-07-21 NOTE — Telephone Encounter (Signed)
Oral Oncology Pharmacist Encounter  Received new prescription for Braftovi (encorafenib) for the treatment of metastatic colorectal cancer, BRAF V600E mutation positive, in conjunction with panitumumab, planned duration until disease progression or unacceptable drug toxicity. Planned start 07/27/22 along with IV therapy.   Prescription dose and frequency assessed.   Current medication list in Epic reviewed, a few DDIs with encorafenib identified: Atorvastatin: encorafenib may increase the serum concentration of atorvastatin. Monitor for increased toxicities related to atorvastatin  Ondansetron: ondansetron may enhance the QTc-prolonging effect of encorafenib.This interaction is likely of greater significance with IV ondansetron, not the oral prescribed to this patient. To avoid the potential interaction prochlorperazine could be used as the primary anti-emetic  Evaluated chart and no patient barriers to medication adherence identified.   Prescription has been e-scribed to the [facility] for benefits analysis and approval.  Oral Oncology Clinic will continue to follow for insurance authorization, copayment issues, initial counseling and start date.   Darl Pikes, PharmD, BCPS, BCOP, CPP Hematology/Oncology Clinical Pharmacist Practitioner [place]/DB/AP Oral [facility] [phone]  07/21/2022 1:14 PM

## 2022-07-21 NOTE — Telephone Encounter (Signed)
Oral Chemotherapy Pharmacist Encounter  [facility] (Specialty) will deliver medication to Karen Kaufman on 07/23/22. She understands the plan to to get started on 07/27/22 along with her IV therapy.  Patient Education I spoke with patient for overview of new oral chemotherapy medication: Braftovi (encorafenib) for the treatment of metastatic colorectal cancer, BRAF V600E mutation positive, in conjunction with panitumumab, planned duration until disease progression or unacceptable drug toxicity. Planned start 07/27/22 along with IV therapy.   Counseled patient on administration, dosing, side effects, monitoring, drug-food interactions, safe handling, storage, and disposal. Patient will take 4 capsules (300 mg total) by mouth daily.  She will also take doxycycline for rash prevention, doxycycline 1 tablet (100 mg total) by mouth 2 (two) times daily. Start on 07/26/2022  Side effects include but not limited to: rash/itchy, diarrhea, nausea, fatigue.    Reviewed with patient importance of keeping a medication schedule and plan for any missed doses.  After discussion with patient no patient barriers to medication adherence identified.   Karen Kaufman voiced understanding and appreciation. All questions answered. Medication handout provided.  Provided patient with Oral [place] [facility] phone number. Patient knows to call the office with questions or concerns. Oral Chemotherapy Navigation Clinic will continue to follow.  Darl Pikes, PharmD, BCPS, BCOP, CPP Hematology/Oncology Clinical Pharmacist Practitioner [place]/DB/AP Oral [facility] [phone]  07/21/2022 2:48 PM

## 2022-07-21 NOTE — Progress Notes (Addendum)
Pharmacist Chemotherapy Monitoring - Initial Assessment    Anticipated start date: 07/27/22   The following has been reviewed per standard work regarding the patient's treatment regimen: The patient's diagnosis, treatment plan and drug doses, and organ/hematologic function Lab orders and baseline tests specific to treatment regimen  The treatment plan start date, drug sequencing, and pre-medications Prior authorization status  Patient's documented medication list, including drug-drug interaction screen and prescriptions for anti-emetics and supportive care specific to the treatment regimen The drug concentrations, fluid compatibility, administration routes, and timing of the medications to be used The patient's access for treatment and lifetime cumulative dose history, if applicable  The patient's medication allergies and previous infusion related reactions, if applicable   Changes made to treatment plan:  N/A  Follow up needed:  Pending authorization for treatment   Treatment has been authorized.   Karen Kaufman, [place], 07/21/2022  3:07 PM

## 2022-07-21 NOTE — Progress Notes (Signed)
DISCONTINUE ON PATHWAY REGIMEN - Colorectal     A cycle is every 14 days:     Oxaliplatin      Leucovorin      Fluorouracil      Fluorouracil   **Always confirm dose/schedule in your pharmacy ordering system**  REASON: Disease Progression PRIOR TREATMENT: [id number]: mFOLFOX6 q14 Days TREATMENT RESPONSE: Partial Response (PR)  START ON PATHWAY REGIMEN - Colorectal     Cycle 1: A cycle is 28 days:     Encorafenib      Cetuximab      Cetuximab    Cycles 2 and beyond: A cycle is every 28 days:     Encorafenib      Cetuximab   **Always confirm dose/schedule in your pharmacy ordering system**  Patient Characteristics: Distant Metastases, Nonsurgical Candidate, BRAF V600 Mutation Positive (KRAS/NRAS Wild-Type), Standard Cytotoxic/Targeted Therapy, Second Line Standard Cytotoxic/Targeted Therapy Tumor Location: Rectal Therapeutic Status: Distant Metastases Microsatellite/Mismatch Repair Status: MSS/pMMR BRAF Mutation Status: Mutation Positive KRAS/NRAS Mutation Status: Wild-Type (no mutation) Standard Cytotoxic/Targeted Line of Therapy: Second Line Standard Cytotoxic/Targeted Therapy Intent of Therapy: Non-Curative / Palliative Intent, Discussed with Patient 

## 2022-07-21 NOTE — Telephone Encounter (Signed)
Oral Oncology Patient Advocate Encounter   Received notification that prior authorization for Braftovi is required.   PA submitted on 10.03.23  Key [id number]  Status is pending     Karen Kaufman, [place] Patient [place]  [phone] (phone) [phone] (fax)

## 2022-07-21 NOTE — Progress Notes (Signed)
[facility] OFFICE PROGRESS NOTE   Diagnosis: Rectal cancer  INTERVAL HISTORY:   Karen Kaufman returns as scheduled.  She continues to have pain in the left leg and left groin.  She takes oxycodone for pain.  She began a trial of gabapentin 07/17/2022.  This has helped partially.  She saw Dr. Dennison Nancy on 07/15/2022.  She agrees with the plan for encorafenib and panitumumab.  She saw Dr. Ronita Hipps.  A proctoscopy revealed a mass at 7 cm.  He does not recommend surgery.  Objective:  Vital signs in last 24 hours:  Blood pressure 107/66, pulse 84, temperature 98.1 F (36.7 C), temperature source Oral, resp. rate 18, height '5\' 6"'  (1.676 m), weight 120 lb (54.4 kg), SpO2 100 %.  Resp: Lungs clear bilaterally Cardio: Regular rate and rhythm GI: No hepatomegaly, left lower quadrant colostomy, tender at the left groin without a discrete mass Vascular: No leg edema Neuro: The motor exam appears intact in the legs and feet bilaterally except for 4/5 strength with flexion at the left hip    Portacath/PICC-without erythema  Lab Results:  Lab Results  Component Value Date   WBC 7.1 07/21/2022   HGB 9.2 (L) 07/21/2022   HCT 28.4 (L) 07/21/2022   MCV 97.3 07/21/2022   PLT 118 (L) 07/21/2022   NEUTROABS 5.7 07/21/2022    CMP  Lab Results  Component Value Date   NA 142 07/10/2022   K 4.0 07/10/2022   CL 104 07/10/2022   CO2 24 07/10/2022   GLUCOSE 79 07/10/2022   BUN 3 (L) 07/10/2022   CREATININE 0.42 (L) 07/10/2022   CALCIUM 8.4 (L) 07/10/2022   PROT 4.5 (L) 07/10/2022   ALBUMIN 3.0 (L) 07/10/2022   AST 40 07/10/2022   ALT 22 07/10/2022   ALKPHOS 381 (H) 07/10/2022   BILITOT 0.2 07/10/2022   GFRNONAA >60 07/07/2022   GFRAA 92 11/28/2020    Lab Results  Component Value Date   CEA1 3.2 02/09/2022   CEA 8.59 (H) 06/23/2022     Medications: I have reviewed the patient's current medications.   Assessment/Plan:  Colorectal cancer 02/08/2022 CT abdomen/pelvis  without contrast-severe circumferential wall thickening of the distal sigmoid colon and rectum; enlarged perirectal and low left retroperitoneal lymph nodes; multiple ill-defined hypodense liver lesions; trace ascites in the low pelvis.  02/08/2022 Colonoscopy by Dr. Charlyne Mom almost completely obstructing medium-sized mass in the rectosigmoid colon measuring from 12 to 17 cm.  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  Foundation 1-microsatellite stable, tumor mutation burden 5, [id number], K-ras wild-type 02/09/2022 CEA 3.2 CT chest 02/18/2022-chest adenopathy.  Multiple hypodense liver lesions. 02/20/2022 biopsy of liver lesion-fragments of benign liver parenchyma with reactive changes.  No evidence of malignancy. MRI abdomen 03/01/2022-numerous hypovascular hepatic lesions having imaging characteristics consider diagnostic of widespread metastatic disease to the liver.  Retroperitoneal lymphadenopathy concerning for metastatic disease. Cycle 1 FOLFOX 03/02/2022 03/11/2022 CTs abdomen/pelvis-distal large bowel obstruction at the site of a large annular mass at the rectosigmoid junction.  Progressive liver metastases. Compared to 4/23 CT. Progressive perirectal, left periaortic and right pericardiophrenic nodal metastases.  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 Cycle 2 FOLFOX 03/31/2022 Cycle 3 FOLFOX 04/13/2022 Cycle 4 FOLFOX delayed 04/28/2022 secondary to neutropenia and thrombocytopenia MRI lumbar spine 05/07/2022-pathologic compression fractures at L1 and L3, multiple additional hypointense lesions concerning for metastatic disease CT abdomen/pelvis 05/08/2022-decrease in size of liver metastases and pelvic/retroperitoneal adenopathy, stable prepericardial/juxta diaphragmatic nodes, new L5 metastasis, L1 and L3 compression  fractures, L1 compression fracture present on 03/11/2022 CT Lumbar spine radiation 05/11/2022 - 05/25/2022, 3000 cGy in 10  fractions Cycle 4 FOLFOX 05/26/2022, Udenyca Cycle 5 FOLFOX 06/10/2022, Udenyca Cycle 6 FOLFOX 06/23/2022, Udenyca, oxaliplatin dose reduced due to thrombocytopenia, dexamethasone premedication decreased to 5 mg per patient request MRI brain 06/26/2022-findings concerning for osseous metastatic disease in the calvarium, skull base and proximal cervical spine.  No evidence of metastatic disease in the brain.   CT abdomen/pelvis 06/27/2022-high-grade small bowel obstruction transitioning in the anterior mid to lower abdomen in the proximal ileum; increased scattered ascites in the abdomen and pelvis, mild; similar appearance of rectosigmoid junction thickening; left perirectal lymph node stable to slightly improved since 05/08/2022, significantly smaller than on earlier studies; improved hepatic metastases; evidence of progression in osseous metastatic disease with a new lesion in the left superior pubic ramus and enlarging lesions in the sacrum CT abdomen/pelvis 07/02/2022 persistent partial small bowel obstruction similar to prior examination 5 days earlier; unchanged ascites; stable partially treated hepatic and osseous metastatic disease; T12 compression fracture appears new from 02/18/2022. Radiation T12-L4, 5 fraction course planned, 1 fraction given 07/01/2022 Rectal bleeding, lower abdominal pain, constipation secondary to #1 Multiple family members with cancer Hypercholesterolemia Port-A-Cath placement 02/20/2022 Hospital admission 03/11/2022-colonic obstruction secondary to colon cancer. 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  She developed clinical and radiologic evidence of disease progression following FOLFOX chemotherapy.  The tumor has a BRAF mutation.  I recommend treatment with encorafenib and panitumumab.  She was seen for a second opinion at [facility] 07/15/2022.  Dr. Dennison Nancy agrees with  the treatment plan.  I reviewed potential toxicities associated with the encorafenib and panitumumab regimen.  We discussed the chance of a rash, skin dryness, allergic reaction, diarrhea, and hematologic toxicity.  We discussed the potential for hypomagnesemia.  She agrees to proceed.  She will begin doxycycline prophylaxis on 07/26/2022 with a plan to begin treatment on 07/27/2022.  A care plan was entered today.  I refilled her prescription for oxycodone.  She will return for an office visit and cycle 2 panitumumab on 08/10/2022.  Betsy Coder, MD  07/21/2022  9:01 AM

## 2022-07-22 ENCOUNTER — Other Ambulatory Visit (HOSPITAL_COMMUNITY): Payer: Self-pay

## 2022-07-23 ENCOUNTER — Inpatient Hospital Stay: Payer: BC Managed Care – PPO

## 2022-07-23 ENCOUNTER — Other Ambulatory Visit (HOSPITAL_COMMUNITY): Payer: Self-pay

## 2022-07-24 ENCOUNTER — Other Ambulatory Visit: Payer: Self-pay | Admitting: *Deleted

## 2022-07-24 ENCOUNTER — Other Ambulatory Visit (HOSPITAL_COMMUNITY): Payer: Self-pay

## 2022-07-24 DIAGNOSIS — C20 Malignant neoplasm of rectum: Secondary | ICD-10-CM

## 2022-07-25 ENCOUNTER — Other Ambulatory Visit: Payer: Self-pay | Admitting: Oncology

## 2022-07-27 ENCOUNTER — Inpatient Hospital Stay: Payer: BC Managed Care – PPO

## 2022-07-27 ENCOUNTER — Other Ambulatory Visit (HOSPITAL_COMMUNITY): Payer: Self-pay

## 2022-07-27 VITALS — BP 99/61 | HR 77 | Temp 98.0°F | Resp 18

## 2022-07-27 DIAGNOSIS — Z5112 Encounter for antineoplastic immunotherapy: Secondary | ICD-10-CM | POA: Diagnosis not present

## 2022-07-27 DIAGNOSIS — C7951 Secondary malignant neoplasm of bone: Secondary | ICD-10-CM

## 2022-07-27 DIAGNOSIS — C20 Malignant neoplasm of rectum: Secondary | ICD-10-CM

## 2022-07-27 LAB — CBC WITH DIFFERENTIAL (CANCER CENTER ONLY)
Abs Immature Granulocytes: 0.14 10*3/uL — ABNORMAL HIGH (ref 0.00–0.07)
Basophils Absolute: 0.1 10*3/uL (ref 0.0–0.1)
Basophils Relative: 1 %
Eosinophils Absolute: 0.4 10*3/uL (ref 0.0–0.5)
Eosinophils Relative: 4 %
HCT: 29.9 % — ABNORMAL LOW (ref 36.0–46.0)
Hemoglobin: 9.5 g/dL — ABNORMAL LOW (ref 12.0–15.0)
Immature Granulocytes: 2 %
Lymphocytes Relative: 6 %
Lymphs Abs: 0.5 10*3/uL — ABNORMAL LOW (ref 0.7–4.0)
MCH: 30.6 pg (ref 26.0–34.0)
MCHC: 31.8 g/dL (ref 30.0–36.0)
MCV: 96.5 fL (ref 80.0–100.0)
Monocytes Absolute: 0.5 10*3/uL (ref 0.1–1.0)
Monocytes Relative: 6 %
Neutro Abs: 7.4 10*3/uL (ref 1.7–7.7)
Neutrophils Relative %: 81 %
Platelet Count: 155 10*3/uL (ref 150–400)
RBC: 3.1 MIL/uL — ABNORMAL LOW (ref 3.87–5.11)
RDW: 16.6 % — ABNORMAL HIGH (ref 11.5–15.5)
WBC Count: 9 10*3/uL (ref 4.0–10.5)
nRBC: 0 % (ref 0.0–0.2)

## 2022-07-27 LAB — CMP (CANCER CENTER ONLY)
ALT: 9 U/L (ref 0–44)
AST: 23 U/L (ref 15–41)
Albumin: 3.2 g/dL — ABNORMAL LOW (ref 3.5–5.0)
Alkaline Phosphatase: 191 U/L — ABNORMAL HIGH (ref 38–126)
Anion gap: 11 (ref 5–15)
BUN: 6 mg/dL — ABNORMAL LOW (ref 8–23)
CO2: 27 mmol/L (ref 22–32)
Calcium: 8.8 mg/dL — ABNORMAL LOW (ref 8.9–10.3)
Chloride: 102 mmol/L (ref 98–111)
Creatinine: 0.49 mg/dL (ref 0.44–1.00)
GFR, Estimated: >60 mL/min (ref >60)
Glucose, Bld: 92 mg/dL (ref 70–99)
Potassium: 3.7 mmol/L (ref 3.5–5.1)
Sodium: 140 mmol/L (ref 135–145)
Total Bilirubin: 0.5 mg/dL (ref 0.3–1.2)
Total Protein: 5.5 g/dL — ABNORMAL LOW (ref 6.5–8.1)

## 2022-07-27 LAB — MAGNESIUM: Magnesium: 1.9 mg/dL (ref 1.7–2.4)

## 2022-07-27 MED ORDER — SODIUM CHLORIDE 0.9% FLUSH
10.0000 mL | INTRAVENOUS | Status: DC | PRN
  Administered 2022-07-27: 10 mL

## 2022-07-27 MED ORDER — HEPARIN SOD (PORK) LOCK FLUSH 100 UNIT/ML IV SOLN
500.0000 [IU] | Freq: Once | INTRAVENOUS | Status: AC | PRN
  Administered 2022-07-27: 500 [IU]

## 2022-07-27 MED ORDER — SODIUM CHLORIDE 0.9 % IV SOLN: Freq: Once | INTRAVENOUS | Status: AC

## 2022-07-27 MED ORDER — SODIUM CHLORIDE 0.9 % IV SOLN
6.0000 mg/kg | Freq: Once | INTRAVENOUS | Status: AC
  Administered 2022-07-27: 300 mg via INTRAVENOUS
  Filled 2022-07-27: qty 15

## 2022-07-27 NOTE — Progress Notes (Signed)
Patient presented to infusion room stating she had not received her encorafenib.  [facility] was called and it was found that the patient encorafenib should be delivered to her house today (07/27/22).  Dr. Benay Spice made aware. Per Dr. Benay Spice, ok to proceed with panitumumab infusion today.

## 2022-07-27 NOTE — Patient Instructions (Signed)

## 2022-07-27 NOTE — Patient Instructions (Signed)
[facility]  Discharge Instructions: Thank you for choosing [facility] to provide your oncology and hematology care.   If you have a lab appointment with the [facility], please go directly to the [facility] and check in at the registration area.   Wear comfortable clothing and clothing appropriate for easy access to any Portacath or PICC line.   We strive to give you quality time with your provider. You may need to reschedule your appointment if you arrive late (15 or more minutes).  Arriving late affects you and other patients whose appointments are after yours.  Also, if you miss three or more appointments without notifying the office, you may be dismissed from the clinic at the provider's discretion.      For prescription refill requests, have your pharmacy contact our office and allow 72 hours for refills to be completed.    Today you received the following chemotherapy and/or immunotherapy agents: panitumumab      To help prevent nausea and vomiting after your treatment, we encourage you to take your nausea medication as directed.  BELOW ARE SYMPTOMS THAT SHOULD BE REPORTED IMMEDIATELY: *FEVER GREATER THAN 100.4 F (38 C) OR HIGHER *CHILLS OR SWEATING *NAUSEA AND VOMITING THAT IS NOT CONTROLLED WITH YOUR NAUSEA MEDICATION *UNUSUAL SHORTNESS OF BREATH *UNUSUAL BRUISING OR BLEEDING *URINARY PROBLEMS (pain or burning when urinating, or frequent urination) *BOWEL PROBLEMS (unusual diarrhea, constipation, pain near the anus) TENDERNESS IN MOUTH AND THROAT WITH OR WITHOUT PRESENCE OF ULCERS (sore throat, sores in mouth, or a toothache) UNUSUAL RASH, SWELLING OR PAIN  UNUSUAL VAGINAL DISCHARGE OR ITCHING   Items with * indicate a potential emergency and should be followed up as soon as possible or go to the Emergency Department if any problems should occur.  Please show the CHEMOTHERAPY ALERT CARD or IMMUNOTHERAPY ALERT CARD at check-in to  the Emergency Department and triage nurse.  Should you have questions after your visit or need to cancel or reschedule your appointment, please contact [facility]  Dept: [phone]  and follow the prompts.  Office hours are 8:00 a.m. to 4:30 p.m. Monday - Friday. Please note that voicemails left after 4:00 p.m. may not be returned until the following business day.  We are closed weekends and major holidays. You have access to a nurse at all times for urgent questions. Please call the main number to the clinic Dept: [phone] and follow the prompts.   For any non-urgent questions, you may also contact your provider using MyChart. We now offer e-Visits for anyone 41 and older to request care online for non-urgent symptoms. For details visit [email or web address].   Also download the MyChart app! Go to the app store, search "MyChart", open the app, select [facility], and log in with your MyChart username and password.  Masks are optional in the cancer centers. If you would like for your care team to wear a mask while they are taking care of you, please let them know. You may have one support person who is at least 62 years old accompany you for your appointments.  Panitumumab Injection What is this medication? PANITUMUMAB (pan i TOOM ue mab) treats colorectal cancer. It works by blocking a protein that causes cancer cells to grow and multiply. This helps to slow or stop the spread of cancer cells. It is a monoclonal antibody. This medicine may be used for other purposes; ask your health care provider or pharmacist if  you have questions. COMMON BRAND NAME(S): Vectibix What should I tell my care team before I take this medication? They need to know if you have any of these conditions: Eye disease Low levels of magnesium in the blood Lung disease An unusual or allergic reaction to panitumumab, other medications, foods, dyes, or preservatives Pregnant or trying to  get pregnant Breast-feeding How should I use this medication? This medication is injected into a vein. It is given by your care team in a hospital or clinic setting. Talk to your care team about the use of this medication in children. Special care may be needed. Overdosage: If you think you have taken too much of this medicine contact a poison control center or emergency room at once. NOTE: This medicine is only for you. Do not share this medicine with others. What if I miss a dose? Keep appointments for follow-up doses. It is important not to miss your dose. Call your care team if you are unable to keep an appointment. What may interact with this medication? Bevacizumab This list may not describe all possible interactions. Give your health care provider a list of all the medicines, herbs, non-prescription drugs, or dietary supplements you use. Also tell them if you smoke, drink alcohol, or use illegal drugs. Some items may interact with your medicine. What should I watch for while using this medication? Your condition will be monitored carefully while you are receiving this medication. This medication may make you feel generally unwell. This is not uncommon as chemotherapy can affect healthy cells as well as cancer cells. Report any side effects. Continue your course of treatment even though you feel ill unless your care team tells you to stop. This medication can make you more sensitive to the sun. Keep out of the sun while receiving this medication and for 2 months after stopping therapy. If you cannot avoid being in the sun, wear protective clothing and sunscreen. Do not use sun lamps, tanning beds, or tanning booths. Check with your care team if you have severe diarrhea, nausea, and vomiting or if you sweat a lot. The loss of too much body fluid may make it dangerous for you to take this medication. This medication may cause serious skin reactions. They can happen weeks to months after starting  the medication. Contact your care team right away if you notice fevers or flu-like symptoms with a rash. The rash may be red or purple and then turn into blisters or peeling of the skin. You may also notice a red rash with swelling of the face, lips, or lymph nodes in your neck or under your arms. Talk to your care team if you may be pregnant. Serious birth defects can occur if you take this medication during pregnancy and for 2 months after the last dose. Contraception is recommended while taking this medication and for 2 months after the last dose. Your care team can help you find the option that works for you. Do not breastfeed while taking this medication and for 2 months after the last dose. This medication may cause infertility. Talk to your care team if you are concerned about your fertility. What side effects may I notice from receiving this medication? Side effects that you should report to your care team as soon as possible: Allergic reactions--skin rash, itching, hives, swelling of the face, lips, tongue, or throat Dry cough, shortness of breath or trouble breathing Eye pain, redness, irritation, or discharge with blurry or decreased vision Infusion reactions--chest pain,  shortness of breath or trouble breathing, feeling faint or lightheaded Low magnesium level--muscle pain or cramps, unusual weakness or fatigue, fast or irregular heartbeat, tremors Low potassium level--muscle pain or cramps, unusual weakness or fatigue, fast or irregular heartbeat, constipation Redness, blistering, peeling, or loosening of the skin, including inside the mouth Skin reactions on sun-exposed areas Side effects that usually do not require medical attention (report to your care team if they continue or are bothersome): Change in nail shape, thickness, or color Diarrhea Dry skin Fatigue Nausea Vomiting This list may not describe all possible side effects. Call your doctor for medical advice about side  effects. You may report side effects to FDA at 1-800-FDA-1088. Where should I keep my medication? This medication is given in a hospital or clinic. It will not be stored at home. NOTE: This sheet is a summary. It may not cover all possible information. If you have questions about this medicine, talk to your doctor, pharmacist, or health care provider.  2023 Elsevier/Gold Standard (2022-01-28 00:00:00)

## 2022-07-28 ENCOUNTER — Telehealth: Payer: Self-pay

## 2022-07-28 NOTE — Telephone Encounter (Signed)
Called patient for first time chemo follow-up.  Patient stated she has been tired today and has stayed in her bed most of the day.  Patient stated she had one bout of nausea and took a compazine pill.  Patient stated she has not had any nausea since.  Patient also confirmed that she received her encorafenib pills yesterday and was able to start taking them yesterday afternoon.  Patient was reminded the tiredness is normal for a few days after treatment.  Encouraged patient to continue to drink fluids, eat small/frequent meals, and to take compazine as needed for nausea.  Patient's next appointment was confirmed, and patient was instructed to contact office with any questions or concerns that may arise.  Patient verbalized understanding. All questions were answered during this phone call.

## 2022-08-04 ENCOUNTER — Other Ambulatory Visit (HOSPITAL_COMMUNITY): Payer: Self-pay

## 2022-08-07 ENCOUNTER — Other Ambulatory Visit: Payer: Self-pay | Admitting: Nurse Practitioner

## 2022-08-07 ENCOUNTER — Telehealth: Payer: Self-pay

## 2022-08-07 DIAGNOSIS — C7951 Secondary malignant neoplasm of bone: Secondary | ICD-10-CM

## 2022-08-07 MED ORDER — OXYCODONE HCL 5 MG PO TABS: 5.0000 mg | ORAL_TABLET | Freq: Four times a day (QID) | ORAL | 0 refills | Status: DC | PRN

## 2022-08-07 NOTE — Telephone Encounter (Signed)
Patient requested a refill of her Oxy IR 5 mg, I placed the request on the Np's desk.

## 2022-08-08 ENCOUNTER — Other Ambulatory Visit: Payer: Self-pay | Admitting: Oncology

## 2022-08-10 ENCOUNTER — Encounter: Payer: Self-pay | Admitting: Nurse Practitioner

## 2022-08-10 ENCOUNTER — Inpatient Hospital Stay: Payer: BC Managed Care – PPO

## 2022-08-10 ENCOUNTER — Other Ambulatory Visit: Payer: Self-pay | Admitting: Oncology

## 2022-08-10 ENCOUNTER — Inpatient Hospital Stay (HOSPITAL_BASED_OUTPATIENT_CLINIC_OR_DEPARTMENT_OTHER): Payer: BC Managed Care – PPO | Admitting: Nurse Practitioner

## 2022-08-10 ENCOUNTER — Other Ambulatory Visit (HOSPITAL_COMMUNITY): Payer: Self-pay

## 2022-08-10 VITALS — BP 98/58 | HR 69

## 2022-08-10 DIAGNOSIS — Z5112 Encounter for antineoplastic immunotherapy: Secondary | ICD-10-CM | POA: Diagnosis not present

## 2022-08-10 DIAGNOSIS — C20 Malignant neoplasm of rectum: Secondary | ICD-10-CM

## 2022-08-10 LAB — CMP (CANCER CENTER ONLY)
ALT: 6 U/L (ref 0–44)
AST: 14 U/L — ABNORMAL LOW (ref 15–41)
Albumin: 3.3 g/dL — ABNORMAL LOW (ref 3.5–5.0)
Alkaline Phosphatase: 188 U/L — ABNORMAL HIGH (ref 38–126)
Anion gap: 7 (ref 5–15)
BUN: 8 mg/dL (ref 8–23)
CO2: 28 mmol/L (ref 22–32)
Calcium: 8.9 mg/dL (ref 8.9–10.3)
Chloride: 101 mmol/L (ref 98–111)
Creatinine: 0.4 mg/dL — ABNORMAL LOW (ref 0.44–1.00)
GFR, Estimated: >60 mL/min (ref >60)
Glucose, Bld: 101 mg/dL — ABNORMAL HIGH (ref 70–99)
Potassium: 4 mmol/L (ref 3.5–5.1)
Sodium: 136 mmol/L (ref 135–145)
Total Bilirubin: 0.5 mg/dL (ref 0.3–1.2)
Total Protein: 5.3 g/dL — ABNORMAL LOW (ref 6.5–8.1)

## 2022-08-10 LAB — CBC WITH DIFFERENTIAL (CANCER CENTER ONLY)
Abs Immature Granulocytes: 0.03 10*3/uL (ref 0.00–0.07)
Basophils Absolute: 0 10*3/uL (ref 0.0–0.1)
Basophils Relative: 1 %
Eosinophils Absolute: 0.2 10*3/uL (ref 0.0–0.5)
Eosinophils Relative: 3 %
HCT: 31.9 % — ABNORMAL LOW (ref 36.0–46.0)
Hemoglobin: 10.2 g/dL — ABNORMAL LOW (ref 12.0–15.0)
Immature Granulocytes: 1 %
Lymphocytes Relative: 8 %
Lymphs Abs: 0.4 10*3/uL — ABNORMAL LOW (ref 0.7–4.0)
MCH: 30.2 pg (ref 26.0–34.0)
MCHC: 32 g/dL (ref 30.0–36.0)
MCV: 94.4 fL (ref 80.0–100.0)
Monocytes Absolute: 0.4 10*3/uL (ref 0.1–1.0)
Monocytes Relative: 7 %
Neutro Abs: 4.5 10*3/uL (ref 1.7–7.7)
Neutrophils Relative %: 80 %
Platelet Count: 184 10*3/uL (ref 150–400)
RBC: 3.38 MIL/uL — ABNORMAL LOW (ref 3.87–5.11)
RDW: 15 % (ref 11.5–15.5)
WBC Count: 5.6 10*3/uL (ref 4.0–10.5)
nRBC: 0 % (ref 0.0–0.2)

## 2022-08-10 LAB — MAGNESIUM: Magnesium: 1.9 mg/dL (ref 1.7–2.4)

## 2022-08-10 MED ORDER — SODIUM CHLORIDE 0.9 % IV SOLN: Freq: Once | INTRAVENOUS | Status: AC

## 2022-08-10 MED ORDER — SODIUM CHLORIDE 0.9 % IV SOLN
6.0000 mg/kg | Freq: Once | INTRAVENOUS | Status: AC
  Administered 2022-08-10: 300 mg via INTRAVENOUS
  Filled 2022-08-10: qty 15

## 2022-08-10 MED ORDER — HEPARIN SOD (PORK) LOCK FLUSH 100 UNIT/ML IV SOLN
500.0000 [IU] | Freq: Once | INTRAVENOUS | Status: AC | PRN
  Administered 2022-08-10: 500 [IU]

## 2022-08-10 MED ORDER — PROCHLORPERAZINE MALEATE 10 MG PO TABS: 10.0000 mg | ORAL_TABLET | Freq: Four times a day (QID) | ORAL | 2 refills | Status: AC | PRN

## 2022-08-10 MED ORDER — SODIUM CHLORIDE 0.9% FLUSH
10.0000 mL | INTRAVENOUS | Status: DC | PRN
  Administered 2022-08-10: 10 mL

## 2022-08-10 NOTE — Patient Instructions (Signed)
[facility]   Discharge Instructions: Thank you for choosing [facility] to provide your oncology and hematology care.   If you have a lab appointment with the [facility], please go directly to the [facility] and check in at the registration area.   Wear comfortable clothing and clothing appropriate for easy access to any Portacath or PICC line.   We strive to give you quality time with your provider. You may need to reschedule your appointment if you arrive late (15 or more minutes).  Arriving late affects you and other patients whose appointments are after yours.  Also, if you miss three or more appointments without notifying the office, you may be dismissed from the clinic at the provider's discretion.      For prescription refill requests, have your pharmacy contact our office and allow 72 hours for refills to be completed.    Today you received the following chemotherapy and/or immunotherapy agents Vectibix.       To help prevent nausea and vomiting after your treatment, we encourage you to take your nausea medication as directed.  BELOW ARE SYMPTOMS THAT SHOULD BE REPORTED IMMEDIATELY: *FEVER GREATER THAN 100.4 F (38 C) OR HIGHER *CHILLS OR SWEATING *NAUSEA AND VOMITING THAT IS NOT CONTROLLED WITH YOUR NAUSEA MEDICATION *UNUSUAL SHORTNESS OF BREATH *UNUSUAL BRUISING OR BLEEDING *URINARY PROBLEMS (pain or burning when urinating, or frequent urination) *BOWEL PROBLEMS (unusual diarrhea, constipation, pain near the anus) TENDERNESS IN MOUTH AND THROAT WITH OR WITHOUT PRESENCE OF ULCERS (sore throat, sores in mouth, or a toothache) UNUSUAL RASH, SWELLING OR PAIN  UNUSUAL VAGINAL DISCHARGE OR ITCHING   Items with * indicate a potential emergency and should be followed up as soon as possible or go to the Emergency Department if any problems should occur.  Please show the CHEMOTHERAPY ALERT CARD or IMMUNOTHERAPY ALERT CARD at check-in to  the Emergency Department and triage nurse.  Should you have questions after your visit or need to cancel or reschedule your appointment, please contact [facility]  Dept: [phone]  and follow the prompts.  Office hours are 8:00 a.m. to 4:30 p.m. Monday - Friday. Please note that voicemails left after 4:00 p.m. may not be returned until the following business day.  We are closed weekends and major holidays. You have access to a nurse at all times for urgent questions. Please call the main number to the clinic Dept: [phone] and follow the prompts.   For any non-urgent questions, you may also contact your provider using MyChart. We now offer e-Visits for anyone 73 and older to request care online for non-urgent symptoms. For details visit [email or web address].   Also download the MyChart app! Go to the app store, search "MyChart", open the app, select [facility], and log in with your MyChart username and password.  Masks are optional in the cancer centers. If you would like for your care team to wear a mask while they are taking care of you, please let them know. You may have one support person who is at least 62 years old accompany you for your appointments.  Panitumumab Injection What is this medication? PANITUMUMAB (pan i TOOM ue mab) treats colorectal cancer. It works by blocking a protein that causes cancer cells to grow and multiply. This helps to slow or stop the spread of cancer cells. It is a monoclonal antibody. This medicine may be used for other purposes; ask your health care provider or  pharmacist if you have questions. COMMON BRAND NAME(S): Vectibix What should I tell my care team before I take this medication? They need to know if you have any of these conditions: Eye disease Low levels of magnesium in the blood Lung disease An unusual or allergic reaction to panitumumab, other medications, foods, dyes, or preservatives Pregnant or trying to  get pregnant Breast-feeding How should I use this medication? This medication is injected into a vein. It is given by your care team in a hospital or clinic setting. Talk to your care team about the use of this medication in children. Special care may be needed. Overdosage: If you think you have taken too much of this medicine contact a poison control center or emergency room at once. NOTE: This medicine is only for you. Do not share this medicine with others. What if I miss a dose? Keep appointments for follow-up doses. It is important not to miss your dose. Call your care team if you are unable to keep an appointment. What may interact with this medication? Bevacizumab This list may not describe all possible interactions. Give your health care provider a list of all the medicines, herbs, non-prescription drugs, or dietary supplements you use. Also tell them if you smoke, drink alcohol, or use illegal drugs. Some items may interact with your medicine. What should I watch for while using this medication? Your condition will be monitored carefully while you are receiving this medication. This medication may make you feel generally unwell. This is not uncommon as chemotherapy can affect healthy cells as well as cancer cells. Report any side effects. Continue your course of treatment even though you feel ill unless your care team tells you to stop. This medication can make you more sensitive to the sun. Keep out of the sun while receiving this medication and for 2 months after stopping therapy. If you cannot avoid being in the sun, wear protective clothing and sunscreen. Do not use sun lamps, tanning beds, or tanning booths. Check with your care team if you have severe diarrhea, nausea, and vomiting or if you sweat a lot. The loss of too much body fluid may make it dangerous for you to take this medication. This medication may cause serious skin reactions. They can happen weeks to months after starting  the medication. Contact your care team right away if you notice fevers or flu-like symptoms with a rash. The rash may be red or purple and then turn into blisters or peeling of the skin. You may also notice a red rash with swelling of the face, lips, or lymph nodes in your neck or under your arms. Talk to your care team if you may be pregnant. Serious birth defects can occur if you take this medication during pregnancy and for 2 months after the last dose. Contraception is recommended while taking this medication and for 2 months after the last dose. Your care team can help you find the option that works for you. Do not breastfeed while taking this medication and for 2 months after the last dose. This medication may cause infertility. Talk to your care team if you are concerned about your fertility. What side effects may I notice from receiving this medication? Side effects that you should report to your care team as soon as possible: Allergic reactions--skin rash, itching, hives, swelling of the face, lips, tongue, or throat Dry cough, shortness of breath or trouble breathing Eye pain, redness, irritation, or discharge with blurry or decreased vision Infusion  reactions--chest pain, shortness of breath or trouble breathing, feeling faint or lightheaded Low magnesium level--muscle pain or cramps, unusual weakness or fatigue, fast or irregular heartbeat, tremors Low potassium level--muscle pain or cramps, unusual weakness or fatigue, fast or irregular heartbeat, constipation Redness, blistering, peeling, or loosening of the skin, including inside the mouth Skin reactions on sun-exposed areas Side effects that usually do not require medical attention (report to your care team if they continue or are bothersome): Change in nail shape, thickness, or color Diarrhea Dry skin Fatigue Nausea Vomiting This list may not describe all possible side effects. Call your doctor for medical advice about side  effects. You may report side effects to FDA at 1-800-FDA-1088. Where should I keep my medication? This medication is given in a hospital or clinic. It will not be stored at home. NOTE: This sheet is a summary. It may not cover all possible information. If you have questions about this medicine, talk to your doctor, pharmacist, or health care provider.  2023 Elsevier/Gold Standard (2022-01-28 00:00:00)

## 2022-08-10 NOTE — Progress Notes (Signed)
[facility] OFFICE PROGRESS NOTE   Diagnosis: Rectal cancer  INTERVAL HISTORY:   Karen Kaufman returns as scheduled.  She completed a cycle of Panitumumab 07/27/2022.  She began encorafenib 07/27/2022.  She has noticed a few "bumps" on her face.  She is concern for a developing skin cancer on the right side of her face.  She is unsure how long the lesion has been present.  She notes it is raised.  She denies significant nausea, few mild episodes.  No diarrhea.  She has occasional back pain.  She denies leg weakness.  Objective:  Vital signs in last 24 hours:  Blood pressure 101/60, pulse 81, temperature 98.1 F (36.7 C), temperature source Oral, resp. rate 20, height _0  (1.676 m), weight 114 lb 12.8 oz (52.1 kg), SpO2 100 %.    HEENT: No thrush or ulcers. Resp: Lungs clear bilaterally. Cardio: Regular rate and rhythm. GI: Abdomen soft and nontender.  No hepatomegaly.  Left lower quadrant colostomy with soft stool in the collection bag. Vascular: No leg edema. Neuro: Alert and oriented.  Lower extremity motor strength is intact. Skin: Right lateral face approximate 5 mm slightly raised dark brown skin lesion, irregular border. Port-A-Cath without erythema.  Lab Results:  Lab Results  Component Value Date   WBC 5.6 08/10/2022   HGB 10.2 (L) 08/10/2022   HCT 31.9 (L) 08/10/2022   MCV 94.4 08/10/2022   PLT 184 08/10/2022   NEUTROABS 4.5 08/10/2022    Imaging:  No results found.  Medications: I have reviewed the patient's current medications.  Assessment/Plan: Colorectal cancer 02/08/2022 CT abdomen/pelvis without contrast-severe circumferential wall thickening of the distal sigmoid colon and rectum; enlarged perirectal and low left retroperitoneal lymph nodes; multiple ill-defined hypodense liver lesions; trace ascites in the low pelvis.  02/08/2022 Colonoscopy by Dr. Charlyne Mom almost completely obstructing medium-sized mass in the rectosigmoid colon  measuring from 12 to 17 cm.  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  Foundation 1-microsatellite stable, tumor mutation burden 5, [id number], K-ras wild-type 02/09/2022 CEA 3.2 CT chest 02/18/2022-chest adenopathy.  Multiple hypodense liver lesions. 02/20/2022 biopsy of liver lesion-fragments of benign liver parenchyma with reactive changes.  No evidence of malignancy. MRI abdomen 03/01/2022-numerous hypovascular hepatic lesions having imaging characteristics consider diagnostic of widespread metastatic disease to the liver.  Retroperitoneal lymphadenopathy concerning for metastatic disease. Cycle 1 FOLFOX 03/02/2022 03/11/2022 CTs abdomen/pelvis-distal large bowel obstruction at the site of a large annular mass at the rectosigmoid junction.  Progressive liver metastases. Compared to 4/23 CT. Progressive perirectal, left periaortic and right pericardiophrenic nodal metastases.  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 Cycle 2 FOLFOX 03/31/2022 Cycle 3 FOLFOX 04/13/2022 Cycle 4 FOLFOX delayed 04/28/2022 secondary to neutropenia and thrombocytopenia MRI lumbar spine 05/07/2022-pathologic compression fractures at L1 and L3, multiple additional hypointense lesions concerning for metastatic disease CT abdomen/pelvis 05/08/2022-decrease in size of liver metastases and pelvic/retroperitoneal adenopathy, stable prepericardial/juxta diaphragmatic nodes, new L5 metastasis, L1 and L3 compression fractures, L1 compression fracture present on 03/11/2022 CT Lumbar spine radiation 05/11/2022 - 05/25/2022, 3000 cGy in 10 fractions Cycle 4 FOLFOX 05/26/2022, Udenyca Cycle 5 FOLFOX 06/10/2022, Udenyca Cycle 6 FOLFOX 06/23/2022, Udenyca, oxaliplatin dose reduced due to thrombocytopenia, dexamethasone premedication decreased to 5 mg per patient request MRI brain 06/26/2022-findings concerning for osseous metastatic disease in the calvarium, skull base and proximal cervical spine.  No  evidence of metastatic disease in the brain.   CT abdomen/pelvis 06/27/2022-high-grade small bowel obstruction transitioning in the anterior mid to lower  abdomen in the proximal ileum; increased scattered ascites in the abdomen and pelvis, mild; similar appearance of rectosigmoid junction thickening; left perirectal lymph node stable to slightly improved since 05/08/2022, significantly smaller than on earlier studies; improved hepatic metastases; evidence of progression in osseous metastatic disease with a new lesion in the left superior pubic ramus and enlarging lesions in the sacrum CT abdomen/pelvis 07/02/2022 persistent partial small bowel obstruction similar to prior examination 5 days earlier; unchanged ascites; stable partially treated hepatic and osseous metastatic disease; T12 compression fracture appears new from 02/18/2022. Radiation T12-L4, 5 fraction course planned, 1 fraction given 07/01/2022 Encorafenib and every 2-week Panitumumab beginning 07/27/2022 Rectal bleeding, lower abdominal pain, constipation secondary to #1 Multiple family members with cancer Hypercholesterolemia Port-A-Cath placement 02/20/2022 Hospital admission 03/11/2022-colonic obstruction secondary to colon cancer. Neutropenia secondary to chemotherapy Admission 06/26/2022 and 07/02/2022 with small bowel obstruction  Disposition: Karen Kaufman appears stable.  She continues encorafenib.  Plan to proceed with Panitumumab today as scheduled.  CBC and chemistry panel reviewed.  Labs adequate to proceed as above.  The skin lesion at the right face may be a seborrheic keratosis.  She is going to schedule a visit with her dermatologist prior to her next visit in 2 weeks.  She has lost weight since her last visit.  She will try adding nutritional supplements.  She will return for lab, follow-up, Panitumumab in 2 weeks.  We are available to see her sooner if needed.    Ned Card ANP/GNP-BC   08/10/2022  9:33 AM

## 2022-08-10 NOTE — Progress Notes (Signed)
Patient seen by Lisa Thomas NP today  Vitals are within treatment parameters.  Labs reviewed by Lisa Thomas NP and are within treatment parameters.  Per physician team, patient is ready for treatment and there are NO modifications to the treatment plan.     

## 2022-08-11 ENCOUNTER — Encounter: Payer: Self-pay | Admitting: Oncology

## 2022-08-11 ENCOUNTER — Other Ambulatory Visit: Payer: Self-pay

## 2022-08-11 ENCOUNTER — Other Ambulatory Visit (HOSPITAL_COMMUNITY): Payer: Self-pay

## 2022-08-11 MED ORDER — BRAFTOVI 75 MG PO CAPS
300.0000 mg | ORAL_CAPSULE | Freq: Every day | ORAL | 0 refills | Status: DC
  Filled 2022-08-11: qty 120, 30d supply, fill #0

## 2022-08-12 ENCOUNTER — Other Ambulatory Visit (HOSPITAL_COMMUNITY): Payer: Self-pay

## 2022-08-13 ENCOUNTER — Ambulatory Visit (INDEPENDENT_AMBULATORY_CARE_PROVIDER_SITE_OTHER): Payer: BC Managed Care – PPO | Admitting: Family Medicine

## 2022-08-13 ENCOUNTER — Ambulatory Visit: Payer: BC Managed Care – PPO | Admitting: Urology

## 2022-08-13 ENCOUNTER — Ambulatory Visit (INDEPENDENT_AMBULATORY_CARE_PROVIDER_SITE_OTHER): Payer: BC Managed Care – PPO

## 2022-08-13 ENCOUNTER — Telehealth: Payer: Self-pay

## 2022-08-13 ENCOUNTER — Other Ambulatory Visit (HOSPITAL_COMMUNITY): Payer: Self-pay

## 2022-08-13 ENCOUNTER — Encounter: Payer: Self-pay | Admitting: Family Medicine

## 2022-08-13 ENCOUNTER — Telehealth: Payer: Self-pay | Admitting: Family Medicine

## 2022-08-13 VITALS — BP 94/61 | HR 91 | Temp 98.6°F | Ht 66.0 in | Wt 114.4 lb

## 2022-08-13 DIAGNOSIS — M25512 Pain in left shoulder: Secondary | ICD-10-CM

## 2022-08-13 DIAGNOSIS — R936 Abnormal findings on diagnostic imaging of limbs: Secondary | ICD-10-CM

## 2022-08-13 MED ORDER — MELOXICAM 15 MG PO TABS: 15.0000 mg | ORAL_TABLET | Freq: Every day | ORAL | 0 refills | Status: AC

## 2022-08-13 NOTE — Telephone Encounter (Signed)
Mrs. Karen Kaufman called to report her dog jumped on her shoulder. Now, she have inflammatory. She went to her pcp and she prescription her NSAIDs (celecoxib). Mrs. Karen Kaufman wanted to know if she can take celecoxib. Per Dr Benay Spice it's okay to take the NSAID.

## 2022-08-13 NOTE — Addendum Note (Signed)
Addended by: Milas Hock on: 08/13/2022 04:31 PM   Modules accepted: Orders

## 2022-08-13 NOTE — Addendum Note (Signed)
Addended by: Gwenlyn Perking on: 08/13/2022 04:41 PM   Modules accepted: Orders

## 2022-08-13 NOTE — Addendum Note (Signed)
Addended by: Gwenlyn Perking on: 08/13/2022 04:23 PM   Modules accepted: Level of Service

## 2022-08-13 NOTE — Progress Notes (Addendum)
   Acute Office Visit  Subjective:     Patient ID: Karen Kaufman, female    DOB: 01/06/1961, 61 y.o.   MRN: [id number]  Chief Complaint  Patient presents with   Shoulder Pain    Shoulder Pain  The pain is present in the left shoulder. This is a new problem. Episode onset: 5 days. There has been a history of trauma (76 lb dog jumped on her shoulder while she was lying on couch). The problem has been gradually worsening. The quality of the pain is described as aching and sharp. The pain is moderate. Associated symptoms include a limited range of motion and stiffness. Pertinent negatives include no fever, joint locking, joint swelling, numbness or tingling. The symptoms are aggravated by activity. She has tried heat for the symptoms. The treatment provided no relief.    Review of Systems  Constitutional:  Negative for fever.  Musculoskeletal:  Positive for stiffness.  Neurological:  Negative for tingling and numbness.      Objective:    BP 94/61   Pulse 91   Temp 98.6 F (37 C) (Temporal)   Ht '5\' 6"'$  (1.676 m)   Wt 114 lb 6 oz (51.9 kg)   SpO2 98%   BMI 18.46 kg/m    Physical Exam Vitals and nursing note reviewed.  Pulmonary:     Effort: Pulmonary effort is normal. No respiratory distress.  Musculoskeletal:     Left shoulder: Tenderness (generalized left shoulder) present. No swelling, deformity or effusion. Decreased range of motion (decreased abduction and internal rotation). Decreased strength.  Skin:    General: Skin is warm and dry.  Neurological:     General: No focal deficit present.     Mental Status: She is alert and oriented to person, place, and time.  Psychiatric:        Mood and Affect: Mood normal.        Behavior: Behavior normal.     No results found for any visits on 08/13/22.      Assessment & Plan:   [name] was seen today for shoulder pain.  Diagnoses and all orders for this visit:  Acute pain of left shoulder Xray today in office. No  fracture or dislocation noted, radiology read pending. Meloxicam daily for pain/inflammation. She declined prednisone today due to side effects. Discussed RICE therapy. Discussed referral to ortho if symptoms worsen or do not improve or if xray report shows acute findings.  -     DG Shoulder Left; Future -     meloxicam (MOBIC) 15 MG tablet; Take 1 tablet (15 mg total) by mouth daily.   The patient indicates understanding of these issues and agrees with the plan.  Gwenlyn Perking, FNP

## 2022-08-13 NOTE — Addendum Note (Signed)
Addended by: Milas Hock on: 08/13/2022 03:53 PM   Modules accepted: Orders

## 2022-08-14 ENCOUNTER — Other Ambulatory Visit: Payer: Self-pay | Admitting: Hematology and Oncology

## 2022-08-14 NOTE — Telephone Encounter (Signed)
Hi Karen Kaufman, this pt see's Dr. Benay Spice now if you can please review and refill if needed.

## 2022-08-17 ENCOUNTER — Other Ambulatory Visit: Payer: Self-pay | Admitting: Nurse Practitioner

## 2022-08-17 ENCOUNTER — Ambulatory Visit: Payer: BC Managed Care – PPO

## 2022-08-17 DIAGNOSIS — C20 Malignant neoplasm of rectum: Secondary | ICD-10-CM

## 2022-08-17 NOTE — Progress Notes (Signed)
Nutrition Assessment   Reason for Assessment:  Patient identified on Malnutrition Screening report for weight loss   ASSESSMENT:  61 year old female with metastatic rectal cancer. Past medical history of colostomy (03/12/22), GERD, HLD.  Patient with progression on folfox.  Patient now on encorafenib and panitumumab.    Spoke with patient via phone.  Reports that she is having nausea and vomiting for the last week or so.  Sometimes vomiting up medication.  Says that she is having output in her ostomy and is not constipated.  No nausea or vomiting today.  Ate a banana this am with juice and miralax.  Had chicken noodle soup, mostly broth for lunch.  Ensure/boost shakes are too thick for her to drink.  Able to drink milk.   Noted hospital admission for SBO on 9/8-9/14  Medications: pepcid, miralax, compazine   Labs: glucose 101   Anthropometrics:   Height: 66 inches Weight: 114 lb 6 oz on 10/26 136 lb 6/13 BMI: 18  16% weight loss in the last 4 1/2 months, significant   Estimated Energy Needs  Kcals: 1300-1600 Protein: 65-80 g Fluid: 1300-1600 ml   NUTRITION DIAGNOSIS: Inadequate oral intake related to cancer and cancer related treatment side effects as evidenced by 16% weight loss in the last 4 1/2 months and decreased intake due to nausea/vomiting    INTERVENTION:  Patient planning on calling MD for medication refill and will report symptoms Discussed strategies to help with nausea and vomiting.  Handout will be mailed. Discussed trying carnation breakfast essentials packet mixed with milk.  She said that husband drinks it. Contact information mailed.   MONITORING, EVALUATION, GOAL: weight trends, intake   Next Visit: Wednesday, Nov 22 phone call  Tahani Potier B. Zenia Resides, [place], [place] Registered Dietitian [phone]

## 2022-08-18 ENCOUNTER — Other Ambulatory Visit (HOSPITAL_COMMUNITY): Payer: Self-pay

## 2022-08-18 ENCOUNTER — Other Ambulatory Visit: Payer: Self-pay

## 2022-08-22 ENCOUNTER — Other Ambulatory Visit: Payer: Self-pay | Admitting: Oncology

## 2022-08-24 ENCOUNTER — Other Ambulatory Visit: Payer: Self-pay | Admitting: Nurse Practitioner

## 2022-08-24 ENCOUNTER — Inpatient Hospital Stay: Payer: BC Managed Care – PPO

## 2022-08-24 ENCOUNTER — Inpatient Hospital Stay: Payer: BC Managed Care – PPO | Admitting: Nurse Practitioner

## 2022-08-24 ENCOUNTER — Telehealth: Payer: Self-pay

## 2022-08-24 DIAGNOSIS — C20 Malignant neoplasm of rectum: Secondary | ICD-10-CM

## 2022-08-24 MED ORDER — OXYCODONE HCL 5 MG PO TABS: 5.0000 mg | ORAL_TABLET | Freq: Four times a day (QID) | ORAL | 0 refills | Status: DC | PRN

## 2022-08-24 NOTE — Telephone Encounter (Signed)
Patient report she have COVID and request a refill of her OXY IR '5mg'$ . The request was placed on NP's desk.

## 2022-08-28 ENCOUNTER — Inpatient Hospital Stay (HOSPITAL_BASED_OUTPATIENT_CLINIC_OR_DEPARTMENT_OTHER): Payer: BC Managed Care – PPO | Admitting: Nurse Practitioner

## 2022-08-28 ENCOUNTER — Inpatient Hospital Stay: Payer: BC Managed Care – PPO | Attending: Nurse Practitioner

## 2022-08-28 ENCOUNTER — Encounter: Payer: Self-pay | Admitting: Nurse Practitioner

## 2022-08-28 ENCOUNTER — Inpatient Hospital Stay: Payer: BC Managed Care – PPO

## 2022-08-28 VITALS — BP 104/63 | HR 81

## 2022-08-28 DIAGNOSIS — Z5112 Encounter for antineoplastic immunotherapy: Secondary | ICD-10-CM | POA: Diagnosis present

## 2022-08-28 DIAGNOSIS — M549 Dorsalgia, unspecified: Secondary | ICD-10-CM | POA: Insufficient documentation

## 2022-08-28 DIAGNOSIS — C20 Malignant neoplasm of rectum: Secondary | ICD-10-CM

## 2022-08-28 DIAGNOSIS — C7951 Secondary malignant neoplasm of bone: Secondary | ICD-10-CM

## 2022-08-28 DIAGNOSIS — D696 Thrombocytopenia, unspecified: Secondary | ICD-10-CM | POA: Diagnosis not present

## 2022-08-28 DIAGNOSIS — T451X5A Adverse effect of antineoplastic and immunosuppressive drugs, initial encounter: Secondary | ICD-10-CM | POA: Insufficient documentation

## 2022-08-28 DIAGNOSIS — Z933 Colostomy status: Secondary | ICD-10-CM | POA: Insufficient documentation

## 2022-08-28 DIAGNOSIS — D701 Agranulocytosis secondary to cancer chemotherapy: Secondary | ICD-10-CM | POA: Insufficient documentation

## 2022-08-28 DIAGNOSIS — E7800 Pure hypercholesterolemia, unspecified: Secondary | ICD-10-CM | POA: Diagnosis not present

## 2022-08-28 DIAGNOSIS — C787 Secondary malignant neoplasm of liver and intrahepatic bile duct: Secondary | ICD-10-CM | POA: Insufficient documentation

## 2022-08-28 LAB — CBC WITH DIFFERENTIAL (CANCER CENTER ONLY)
Abs Immature Granulocytes: 0.05 10*3/uL (ref 0.00–0.07)
Basophils Absolute: 0 10*3/uL (ref 0.0–0.1)
Basophils Relative: 0 %
Eosinophils Absolute: 0.2 10*3/uL (ref 0.0–0.5)
Eosinophils Relative: 3 %
HCT: 33.7 % — ABNORMAL LOW (ref 36.0–46.0)
Hemoglobin: 10.7 g/dL — ABNORMAL LOW (ref 12.0–15.0)
Immature Granulocytes: 1 %
Lymphocytes Relative: 9 %
Lymphs Abs: 0.6 10*3/uL — ABNORMAL LOW (ref 0.7–4.0)
MCH: 28.8 pg (ref 26.0–34.0)
MCHC: 31.8 g/dL (ref 30.0–36.0)
MCV: 90.6 fL (ref 80.0–100.0)
Monocytes Absolute: 0.4 10*3/uL (ref 0.1–1.0)
Monocytes Relative: 7 %
Neutro Abs: 4.7 10*3/uL (ref 1.7–7.7)
Neutrophils Relative %: 80 %
Platelet Count: 233 10*3/uL (ref 150–400)
RBC: 3.72 MIL/uL — ABNORMAL LOW (ref 3.87–5.11)
RDW: 15.4 % (ref 11.5–15.5)
WBC Count: 5.9 10*3/uL (ref 4.0–10.5)
nRBC: 0 % (ref 0.0–0.2)

## 2022-08-28 LAB — CMP (CANCER CENTER ONLY)
ALT: 8 U/L (ref 0–44)
AST: 17 U/L (ref 15–41)
Albumin: 2.9 g/dL — ABNORMAL LOW (ref 3.5–5.0)
Alkaline Phosphatase: 182 U/L — ABNORMAL HIGH (ref 38–126)
Anion gap: 11 (ref 5–15)
BUN: 8 mg/dL (ref 8–23)
CO2: 24 mmol/L (ref 22–32)
Calcium: 8.5 mg/dL — ABNORMAL LOW (ref 8.9–10.3)
Chloride: 102 mmol/L (ref 98–111)
Creatinine: 0.55 mg/dL (ref 0.44–1.00)
GFR, Estimated: >60 mL/min (ref >60)
Glucose, Bld: 116 mg/dL — ABNORMAL HIGH (ref 70–99)
Potassium: 3.8 mmol/L (ref 3.5–5.1)
Sodium: 137 mmol/L (ref 135–145)
Total Bilirubin: 0.6 mg/dL (ref 0.3–1.2)
Total Protein: 5.1 g/dL — ABNORMAL LOW (ref 6.5–8.1)

## 2022-08-28 LAB — MAGNESIUM: Magnesium: 1.7 mg/dL (ref 1.7–2.4)

## 2022-08-28 MED ORDER — ZOLEDRONIC ACID 4 MG/100ML IV SOLN
4.0000 mg | Freq: Once | INTRAVENOUS | Status: AC
  Administered 2022-08-28: 4 mg via INTRAVENOUS
  Filled 2022-08-28: qty 100

## 2022-08-28 MED ORDER — SODIUM CHLORIDE 0.9% FLUSH
10.0000 mL | INTRAVENOUS | Status: DC | PRN
  Administered 2022-08-28: 10 mL

## 2022-08-28 MED ORDER — SODIUM CHLORIDE 0.9 % IV SOLN: Freq: Once | INTRAVENOUS | Status: AC

## 2022-08-28 MED ORDER — SODIUM CHLORIDE 0.9 % IV SOLN
6.0000 mg/kg | Freq: Once | INTRAVENOUS | Status: AC
  Administered 2022-08-28: 300 mg via INTRAVENOUS
  Filled 2022-08-28: qty 15

## 2022-08-28 MED ORDER — HEPARIN SOD (PORK) LOCK FLUSH 100 UNIT/ML IV SOLN
500.0000 [IU] | Freq: Once | INTRAVENOUS | Status: AC | PRN
  Administered 2022-08-28: 500 [IU]

## 2022-08-28 NOTE — Patient Instructions (Signed)
Karen Kaufman   Discharge Instructions: Thank you for choosing [facility] to provide your oncology and hematology care.   If you have a lab appointment with the [facility], please go directly to the [facility] and check in at the registration area.   Wear comfortable clothing and clothing appropriate for easy access to any Portacath or PICC line.   We strive to give you quality time with your provider. You may need to reschedule your appointment if you arrive late (15 or more minutes).  Arriving late affects you and other patients whose appointments are after yours.  Also, if you miss three or more appointments without notifying the office, you may be dismissed from the clinic at the provider's discretion.      For prescription refill requests, have your pharmacy contact our office and allow 72 hours for refills to be completed.    Today you received the following chemotherapy and/or immunotherapy agents Vectibix.      To help prevent nausea and vomiting after your treatment, we encourage you to take your nausea medication as directed.  BELOW ARE SYMPTOMS THAT SHOULD BE REPORTED IMMEDIATELY: *FEVER GREATER THAN 100.4 F (38 C) OR HIGHER *CHILLS OR SWEATING *NAUSEA AND VOMITING THAT IS NOT CONTROLLED WITH YOUR NAUSEA MEDICATION *UNUSUAL SHORTNESS OF BREATH *UNUSUAL BRUISING OR BLEEDING *URINARY PROBLEMS (pain or burning when urinating, or frequent urination) *BOWEL PROBLEMS (unusual diarrhea, constipation, pain near the anus) TENDERNESS IN MOUTH AND THROAT WITH OR WITHOUT PRESENCE OF ULCERS (sore throat, sores in mouth, or a toothache) UNUSUAL RASH, SWELLING OR PAIN  UNUSUAL VAGINAL DISCHARGE OR ITCHING   Items with * indicate a potential emergency and should be followed up as soon as possible or go to the Emergency Department if any problems should occur.  Please show the CHEMOTHERAPY ALERT CARD or IMMUNOTHERAPY ALERT CARD at check-in to  the Emergency Department and triage nurse.  Should you have questions after your visit or need to cancel or reschedule your appointment, please contact [facility]  Dept: [phone]  and follow the prompts.  Office hours are 8:00 a.m. to 4:30 p.m. Monday - Friday. Please note that voicemails left after 4:00 p.m. may not be returned until the following business day.  We are closed weekends and major holidays. You have access to a nurse at all times for urgent questions. Please call the main number to the clinic Dept: [phone] and follow the prompts.   For any non-urgent questions, you may also contact your provider using MyChart. We now offer e-Visits for anyone 59 and older to request care online for non-urgent symptoms. For details visit [email or web address].   Also download the MyChart app! Go to the app store, search "MyChart", open the app, select [facility], and log in with your MyChart username and password.  Masks are optional in the cancer centers. If you would like for your care team to wear a mask while they are taking care of you, please let them know. You may have one support person who is at least 62 years old accompany you for your appointments.  Panitumumab Injection What is this medication? PANITUMUMAB (pan i TOOM ue mab) treats colorectal cancer. It works by blocking a protein that causes cancer cells to grow and multiply. This helps to slow or stop the spread of cancer cells. It is a monoclonal antibody. This medicine may be used for other purposes; ask your health care provider or pharmacist  if you have questions. COMMON BRAND NAME(S): Vectibix What should I tell my care team before I take this medication? They need to know if you have any of these conditions: Eye disease Low levels of magnesium in the blood Lung disease An unusual or allergic reaction to panitumumab, other medications, foods, dyes, or preservatives Pregnant or trying to  get pregnant Breast-feeding How should I use this medication? This medication is injected into a vein. It is given by your care team in a hospital or clinic setting. Talk to your care team about the use of this medication in children. Special care may be needed. Overdosage: If you think you have taken too much of this medicine contact a poison control center or emergency room at once. NOTE: This medicine is only for you. Do not share this medicine with others. What if I miss a dose? Keep appointments for follow-up doses. It is important not to miss your dose. Call your care team if you are unable to keep an appointment. What may interact with this medication? Bevacizumab This list may not describe all possible interactions. Give your health care provider a list of all the medicines, herbs, non-prescription drugs, or dietary supplements you use. Also tell them if you smoke, drink alcohol, or use illegal drugs. Some items may interact with your medicine. What should I watch for while using this medication? Your condition will be monitored carefully while you are receiving this medication. This medication may make you feel generally unwell. This is not uncommon as chemotherapy can affect healthy cells as well as cancer cells. Report any side effects. Continue your course of treatment even though you feel ill unless your care team tells you to stop. This medication can make you more sensitive to the sun. Keep out of the sun while receiving this medication and for 2 months after stopping therapy. If you cannot avoid being in the sun, wear protective clothing and sunscreen. Do not use sun lamps, tanning beds, or tanning booths. Check with your care team if you have severe diarrhea, nausea, and vomiting or if you sweat a lot. The loss of too much body fluid may make it dangerous for you to take this medication. This medication may cause serious skin reactions. They can happen weeks to months after starting  the medication. Contact your care team right away if you notice fevers or flu-like symptoms with a rash. The rash may be red or purple and then turn into blisters or peeling of the skin. You may also notice a red rash with swelling of the face, lips, or lymph nodes in your neck or under your arms. Talk to your care team if you may be pregnant. Serious birth defects can occur if you take this medication during pregnancy and for 2 months after the last dose. Contraception is recommended while taking this medication and for 2 months after the last dose. Your care team can help you find the option that works for you. Do not breastfeed while taking this medication and for 2 months after the last dose. This medication may cause infertility. Talk to your care team if you are concerned about your fertility. What side effects may I notice from receiving this medication? Side effects that you should report to your care team as soon as possible: Allergic reactions--skin rash, itching, hives, swelling of the face, lips, tongue, or throat Dry cough, shortness of breath or trouble breathing Eye pain, redness, irritation, or discharge with blurry or decreased vision Infusion reactions--chest  pain, shortness of breath or trouble breathing, feeling faint or lightheaded Low magnesium level--muscle pain or cramps, unusual weakness or fatigue, fast or irregular heartbeat, tremors Low potassium level--muscle pain or cramps, unusual weakness or fatigue, fast or irregular heartbeat, constipation Redness, blistering, peeling, or loosening of the skin, including inside the mouth Skin reactions on sun-exposed areas Side effects that usually do not require medical attention (report to your care team if they continue or are bothersome): Change in nail shape, thickness, or color Diarrhea Dry skin Fatigue Nausea Vomiting This list may not describe all possible side effects. Call your doctor for medical advice about side  effects. You may report side effects to FDA at 1-800-FDA-1088. Where should I keep my medication? This medication is given in a hospital or clinic. It will not be stored at home. NOTE: This sheet is a summary. It may not cover all possible information. If you have questions about this medicine, talk to your doctor, pharmacist, or health care provider.  2023 Elsevier/Gold Standard (2022-02-18 00:00:00)

## 2022-08-28 NOTE — Progress Notes (Signed)
[facility] OFFICE PROGRESS NOTE   Diagnosis: Rectal cancer  INTERVAL HISTORY:   Karen Kaufman returns as scheduled.  She continues encorafenib.  She completed another cycle of Panitumumab 08/10/2022.  She notes morning nausea after taking her medications.  She has occasional vomiting.  No mouth sores.  No diarrhea.  She reports seeing dermatology recently and had 2 areas on her face biopsied.  She continues to have back pain.  She takes oxycodone as needed.  Appetite is poor.  She has lost some weight.  She was diagnosed with COVID 08/20/2022, reports isolated fever 101.2, mild cough, runny nose.  Objective:  Vital signs in last 24 hours:  Blood pressure 106/70, pulse 93, temperature 98.1 F (36.7 C), temperature source Oral, resp. rate 18, height _0  (1.676 m), weight 108 lb (49 kg), SpO2 100 %.    HEENT: No thrush or ulcers. Resp: Lungs clear bilaterally. Cardio: Regular rate and rhythm. GI: Abdomen soft and nontender.  No hepatomegaly.  Left lower quadrant colostomy with formed stool in the collection bag. Vascular: No leg edema.  Skin: Biopsy site anterior scalp without erythema.  Biopsy site right face without erythema. Port-A-Cath without erythema.   Lab Results:  Lab Results  Component Value Date   WBC 5.9 08/28/2022   HGB 10.7 (L) 08/28/2022   HCT 33.7 (L) 08/28/2022   MCV 90.6 08/28/2022   PLT 233 08/28/2022   NEUTROABS 4.7 08/28/2022    Imaging:  No results found.  Medications: I have reviewed the patient's current medications.  Assessment/Plan: Colorectal cancer 02/08/2022 CT abdomen/pelvis without contrast-severe circumferential wall thickening of the distal sigmoid colon and rectum; enlarged perirectal and low left retroperitoneal lymph nodes; multiple ill-defined hypodense liver lesions; trace ascites in the low pelvis.  02/08/2022 Colonoscopy by Dr. Charlyne Mom almost completely obstructing medium-sized mass in the rectosigmoid colon  measuring from 12 to 17 cm.  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  Foundation 1-microsatellite stable, tumor mutation burden 5, BRAFV600E, K-ras wild-type 02/09/2022 CEA 3.2 CT chest 02/18/2022-chest adenopathy.  Multiple hypodense liver lesions. 02/20/2022 biopsy of liver lesion-fragments of benign liver parenchyma with reactive changes.  No evidence of malignancy. MRI abdomen 03/01/2022-numerous hypovascular hepatic lesions having imaging characteristics consider diagnostic of widespread metastatic disease to the liver.  Retroperitoneal lymphadenopathy concerning for metastatic disease. Cycle 1 FOLFOX 03/02/2022 03/11/2022 CTs abdomen/pelvis-distal large bowel obstruction at the site of a large annular mass at the rectosigmoid junction.  Progressive liver metastases. Compared to 4/23 CT. Progressive perirectal, left periaortic and right pericardiophrenic nodal metastases.  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 Cycle 2 FOLFOX 03/31/2022 Cycle 3 FOLFOX 04/13/2022 Cycle 4 FOLFOX delayed 04/28/2022 secondary to neutropenia and thrombocytopenia MRI lumbar spine 05/07/2022-pathologic compression fractures at L1 and L3, multiple additional hypointense lesions concerning for metastatic disease CT abdomen/pelvis 05/08/2022-decrease in size of liver metastases and pelvic/retroperitoneal adenopathy, stable prepericardial/juxta diaphragmatic nodes, new L5 metastasis, L1 and L3 compression fractures, L1 compression fracture present on 03/11/2022 CT Lumbar spine radiation 05/11/2022 - 05/25/2022, 3000 cGy in 10 fractions Cycle 4 FOLFOX 05/26/2022, Udenyca Cycle 5 FOLFOX 06/10/2022, Udenyca Cycle 6 FOLFOX 06/23/2022, Udenyca, oxaliplatin dose reduced due to thrombocytopenia, dexamethasone premedication decreased to 5 mg per patient request MRI brain 06/26/2022-findings concerning for osseous metastatic disease in the calvarium, skull base and proximal cervical spine.  No  evidence of metastatic disease in the brain.   CT abdomen/pelvis 06/27/2022-high-grade small bowel obstruction transitioning in the anterior mid to lower abdomen in the proximal ileum;  increased scattered ascites in the abdomen and pelvis, mild; similar appearance of rectosigmoid junction thickening; left perirectal lymph node stable to slightly improved since 05/08/2022, significantly smaller than on earlier studies; improved hepatic metastases; evidence of progression in osseous metastatic disease with a new lesion in the left superior pubic ramus and enlarging lesions in the sacrum CT abdomen/pelvis 07/02/2022 persistent partial small bowel obstruction similar to prior examination 5 days earlier; unchanged ascites; stable partially treated hepatic and osseous metastatic disease; T12 compression fracture appears new from 02/18/2022. Radiation T12-L4, 5 fraction course planned, 1 fraction given 07/01/2022 Encorafenib and every 2-week Panitumumab beginning 07/27/2022 Rectal bleeding, lower abdominal pain, constipation secondary to #1 Multiple family members with cancer Hypercholesterolemia Port-A-Cath placement 02/20/2022 Hospital admission 03/11/2022-colonic obstruction secondary to colon cancer. Neutropenia secondary to chemotherapy Admission 06/26/2022 and 07/02/2022 with small bowel obstruction COVID 08/20/2022  Disposition: Karen Kaufman appears stable.  She continues Encorafenib and every 2-week Panitumumab.  She is having nausea after taking Encorafenib.  She will begin Compazine 30 minutes to 1 hour prior to the dose.  Plan to proceed with Panitumumab today as scheduled.  CBC and chemistry panel reviewed.  Labs adequate to proceed as above.  Magnesium is in normal range.  She will continue oxycodone for back pain.  She will meet with the dietitian next office visit.  She will return for lab, follow-up, Panitumumab 09/09/2022.  We are available to see her sooner if needed.     Ned Card ANP/GNP-BC    08/28/2022  8:40 AM

## 2022-08-28 NOTE — Progress Notes (Signed)
Patient seen by Lisa Thomas NP today  Vitals are within treatment parameters.  Labs reviewed by Lisa Thomas NP and are within treatment parameters.  Per physician team, patient is ready for treatment and there are NO modifications to the treatment plan.     

## 2022-08-30 ENCOUNTER — Other Ambulatory Visit: Payer: Self-pay

## 2022-09-01 ENCOUNTER — Other Ambulatory Visit: Payer: Self-pay | Admitting: Nurse Practitioner

## 2022-09-01 ENCOUNTER — Telehealth: Payer: Self-pay

## 2022-09-01 DIAGNOSIS — C7951 Secondary malignant neoplasm of bone: Secondary | ICD-10-CM

## 2022-09-01 MED ORDER — OXYCODONE HCL 5 MG PO TABS: 5.0000 mg | ORAL_TABLET | ORAL | 0 refills | Status: DC | PRN

## 2022-09-01 NOTE — Telephone Encounter (Signed)
Called Karen Kaufman to let know her a new prescription of her oxyCodone was sent to her pharmacy and she have new instruction she can take 1-2 tablet every 4 hours.

## 2022-09-06 ENCOUNTER — Other Ambulatory Visit: Payer: Self-pay | Admitting: Oncology

## 2022-09-06 DIAGNOSIS — C20 Malignant neoplasm of rectum: Secondary | ICD-10-CM

## 2022-09-08 ENCOUNTER — Inpatient Hospital Stay: Payer: BC Managed Care – PPO

## 2022-09-08 ENCOUNTER — Inpatient Hospital Stay: Payer: BC Managed Care – PPO | Admitting: Oncology

## 2022-09-09 ENCOUNTER — Inpatient Hospital Stay: Payer: BC Managed Care – PPO

## 2022-09-09 ENCOUNTER — Inpatient Hospital Stay: Payer: BC Managed Care – PPO | Admitting: Nutrition

## 2022-09-09 ENCOUNTER — Inpatient Hospital Stay (HOSPITAL_BASED_OUTPATIENT_CLINIC_OR_DEPARTMENT_OTHER): Payer: BC Managed Care – PPO | Admitting: Nurse Practitioner

## 2022-09-09 ENCOUNTER — Telehealth: Payer: Self-pay

## 2022-09-09 ENCOUNTER — Encounter: Payer: Self-pay | Admitting: Nurse Practitioner

## 2022-09-09 VITALS — BP 98/70 | HR 85 | Temp 98.2°F | Resp 18 | Ht 66.0 in | Wt 113.0 lb

## 2022-09-09 VITALS — BP 94/63 | HR 79 | Temp 97.9°F | Resp 18

## 2022-09-09 DIAGNOSIS — C20 Malignant neoplasm of rectum: Secondary | ICD-10-CM

## 2022-09-09 DIAGNOSIS — Z5112 Encounter for antineoplastic immunotherapy: Secondary | ICD-10-CM | POA: Diagnosis not present

## 2022-09-09 DIAGNOSIS — C7951 Secondary malignant neoplasm of bone: Secondary | ICD-10-CM

## 2022-09-09 LAB — CBC WITH DIFFERENTIAL (CANCER CENTER ONLY)
Abs Immature Granulocytes: 0.04 10*3/uL (ref 0.00–0.07)
Basophils Absolute: 0 10*3/uL (ref 0.0–0.1)
Basophils Relative: 1 %
Eosinophils Absolute: 0.2 10*3/uL (ref 0.0–0.5)
Eosinophils Relative: 4 %
HCT: 31.3 % — ABNORMAL LOW (ref 36.0–46.0)
Hemoglobin: 9.8 g/dL — ABNORMAL LOW (ref 12.0–15.0)
Immature Granulocytes: 1 %
Lymphocytes Relative: 8 %
Lymphs Abs: 0.3 10*3/uL — ABNORMAL LOW (ref 0.7–4.0)
MCH: 28.2 pg (ref 26.0–34.0)
MCHC: 31.3 g/dL (ref 30.0–36.0)
MCV: 89.9 fL (ref 80.0–100.0)
Monocytes Absolute: 0.4 10*3/uL (ref 0.1–1.0)
Monocytes Relative: 12 %
Neutro Abs: 2.8 10*3/uL (ref 1.7–7.7)
Neutrophils Relative %: 74 %
Platelet Count: 124 10*3/uL — ABNORMAL LOW (ref 150–400)
RBC: 3.48 MIL/uL — ABNORMAL LOW (ref 3.87–5.11)
RDW: 16.5 % — ABNORMAL HIGH (ref 11.5–15.5)
WBC Count: 3.8 10*3/uL — ABNORMAL LOW (ref 4.0–10.5)
nRBC: 0 % (ref 0.0–0.2)

## 2022-09-09 LAB — CMP (CANCER CENTER ONLY)
ALT: 7 U/L (ref 0–44)
AST: 25 U/L (ref 15–41)
Albumin: 2.6 g/dL — ABNORMAL LOW (ref 3.5–5.0)
Alkaline Phosphatase: 195 U/L — ABNORMAL HIGH (ref 38–126)
Anion gap: 6 (ref 5–15)
BUN: 8 mg/dL (ref 8–23)
CO2: 27 mmol/L (ref 22–32)
Calcium: 7.8 mg/dL — ABNORMAL LOW (ref 8.9–10.3)
Chloride: 103 mmol/L (ref 98–111)
Creatinine: 0.43 mg/dL — ABNORMAL LOW (ref 0.44–1.00)
GFR, Estimated: >60 mL/min (ref >60)
Glucose, Bld: 102 mg/dL — ABNORMAL HIGH (ref 70–99)
Potassium: 4 mmol/L (ref 3.5–5.1)
Sodium: 136 mmol/L (ref 135–145)
Total Bilirubin: 0.9 mg/dL (ref 0.3–1.2)
Total Protein: 4.7 g/dL — ABNORMAL LOW (ref 6.5–8.1)

## 2022-09-09 LAB — MAGNESIUM: Magnesium: 1.9 mg/dL (ref 1.7–2.4)

## 2022-09-09 MED ORDER — SODIUM CHLORIDE 0.9 % IV SOLN: Freq: Once | INTRAVENOUS | Status: AC

## 2022-09-09 MED ORDER — OXYCODONE HCL 5 MG PO TABS: 5.0000 mg | ORAL_TABLET | ORAL | 0 refills | Status: DC | PRN

## 2022-09-09 MED ORDER — SODIUM CHLORIDE 0.9 % IV SOLN
6.0000 mg/kg | Freq: Once | INTRAVENOUS | Status: AC
  Administered 2022-09-09: 300 mg via INTRAVENOUS
  Filled 2022-09-09: qty 15

## 2022-09-09 MED ORDER — MORPHINE SULFATE ER 15 MG PO TBCR: 15.0000 mg | EXTENDED_RELEASE_TABLET | Freq: Two times a day (BID) | ORAL | 0 refills | Status: AC

## 2022-09-09 MED ORDER — OXYCODONE HCL 5 MG PO TABS
10.0000 mg | ORAL_TABLET | Freq: Once | ORAL | Status: AC
  Administered 2022-09-09: 10 mg via ORAL
  Filled 2022-09-09: qty 2

## 2022-09-09 MED ORDER — SODIUM CHLORIDE 0.9% FLUSH
10.0000 mL | INTRAVENOUS | Status: DC | PRN
  Administered 2022-09-09: 10 mL

## 2022-09-09 MED ORDER — HEPARIN SOD (PORK) LOCK FLUSH 100 UNIT/ML IV SOLN
500.0000 [IU] | Freq: Once | INTRAVENOUS | Status: AC | PRN
  Administered 2022-09-09: 500 [IU]

## 2022-09-09 NOTE — Patient Instructions (Signed)
[facility]   Discharge Instructions: Thank you for choosing [facility] to provide your oncology and hematology care.   If you have a lab appointment with the [facility], please go directly to the [facility] and check in at the registration area.   Wear comfortable clothing and clothing appropriate for easy access to any Portacath or PICC line.   We strive to give you quality time with your provider. You may need to reschedule your appointment if you arrive late (15 or more minutes).  Arriving late affects you and other patients whose appointments are after yours.  Also, if you miss three or more appointments without notifying the office, you may be dismissed from the clinic at the provider's discretion.      For prescription refill requests, have your pharmacy contact our office and allow 72 hours for refills to be completed.    Today you received the following chemotherapy and/or immunotherapy agents Vectibix.      To help prevent nausea and vomiting after your treatment, we encourage you to take your nausea medication as directed.  BELOW ARE SYMPTOMS THAT SHOULD BE REPORTED IMMEDIATELY: *FEVER GREATER THAN 100.4 F (38 C) OR HIGHER *CHILLS OR SWEATING *NAUSEA AND VOMITING THAT IS NOT CONTROLLED WITH YOUR NAUSEA MEDICATION *UNUSUAL SHORTNESS OF BREATH *UNUSUAL BRUISING OR BLEEDING *URINARY PROBLEMS (pain or burning when urinating, or frequent urination) *BOWEL PROBLEMS (unusual diarrhea, constipation, pain near the anus) TENDERNESS IN MOUTH AND THROAT WITH OR WITHOUT PRESENCE OF ULCERS (sore throat, sores in mouth, or a toothache) UNUSUAL RASH, SWELLING OR PAIN  UNUSUAL VAGINAL DISCHARGE OR ITCHING   Items with * indicate a potential emergency and should be followed up as soon as possible or go to the Emergency Department if any problems should occur.  Please show the CHEMOTHERAPY ALERT CARD or IMMUNOTHERAPY ALERT CARD at check-in to  the Emergency Department and triage nurse.  Should you have questions after your visit or need to cancel or reschedule your appointment, please contact [facility]  Dept: [phone]  and follow the prompts.  Office hours are 8:00 a.m. to 4:30 p.m. Monday - Friday. Please note that voicemails left after 4:00 p.m. may not be returned until the following business day.  We are closed weekends and major holidays. You have access to a nurse at all times for urgent questions. Please call the main number to the clinic Dept: [phone] and follow the prompts.   For any non-urgent questions, you may also contact your provider using MyChart. We now offer e-Visits for anyone 15 and older to request care online for non-urgent symptoms. For details visit [email or web address].   Also download the MyChart app! Go to the app store, search "MyChart", open the app, select [facility], and log in with your MyChart username and password.  Masks are optional in the cancer centers. If you would like for your care team to wear a mask while they are taking care of you, please let them know. You may have one support person who is at least 62 years old accompany you for your appointments.  Panitumumab Injection What is this medication? PANITUMUMAB (pan i TOOM ue mab) treats colorectal cancer. It works by blocking a protein that causes cancer cells to grow and multiply. This helps to slow or stop the spread of cancer cells. It is a monoclonal antibody. This medicine may be used for other purposes; ask your health care provider or pharmacist  if you have questions. COMMON BRAND NAME(S): Vectibix What should I tell my care team before I take this medication? They need to know if you have any of these conditions: Eye disease Low levels of magnesium in the blood Lung disease An unusual or allergic reaction to panitumumab, other medications, foods, dyes, or preservatives Pregnant or trying to  get pregnant Breast-feeding How should I use this medication? This medication is injected into a vein. It is given by your care team in a hospital or clinic setting. Talk to your care team about the use of this medication in children. Special care may be needed. Overdosage: If you think you have taken too much of this medicine contact a poison control center or emergency room at once. NOTE: This medicine is only for you. Do not share this medicine with others. What if I miss a dose? Keep appointments for follow-up doses. It is important not to miss your dose. Call your care team if you are unable to keep an appointment. What may interact with this medication? Bevacizumab This list may not describe all possible interactions. Give your health care provider a list of all the medicines, herbs, non-prescription drugs, or dietary supplements you use. Also tell them if you smoke, drink alcohol, or use illegal drugs. Some items may interact with your medicine. What should I watch for while using this medication? Your condition will be monitored carefully while you are receiving this medication. This medication may make you feel generally unwell. This is not uncommon as chemotherapy can affect healthy cells as well as cancer cells. Report any side effects. Continue your course of treatment even though you feel ill unless your care team tells you to stop. This medication can make you more sensitive to the sun. Keep out of the sun while receiving this medication and for 2 months after stopping therapy. If you cannot avoid being in the sun, wear protective clothing and sunscreen. Do not use sun lamps, tanning beds, or tanning booths. Check with your care team if you have severe diarrhea, nausea, and vomiting or if you sweat a lot. The loss of too much body fluid may make it dangerous for you to take this medication. This medication may cause serious skin reactions. They can happen weeks to months after starting  the medication. Contact your care team right away if you notice fevers or flu-like symptoms with a rash. The rash may be red or purple and then turn into blisters or peeling of the skin. You may also notice a red rash with swelling of the face, lips, or lymph nodes in your neck or under your arms. Talk to your care team if you may be pregnant. Serious birth defects can occur if you take this medication during pregnancy and for 2 months after the last dose. Contraception is recommended while taking this medication and for 2 months after the last dose. Your care team can help you find the option that works for you. Do not breastfeed while taking this medication and for 2 months after the last dose. This medication may cause infertility. Talk to your care team if you are concerned about your fertility. What side effects may I notice from receiving this medication? Side effects that you should report to your care team as soon as possible: Allergic reactions--skin rash, itching, hives, swelling of the face, lips, tongue, or throat Dry cough, shortness of breath or trouble breathing Eye pain, redness, irritation, or discharge with blurry or decreased vision Infusion reactions--chest  pain, shortness of breath or trouble breathing, feeling faint or lightheaded Low magnesium level--muscle pain or cramps, unusual weakness or fatigue, fast or irregular heartbeat, tremors Low potassium level--muscle pain or cramps, unusual weakness or fatigue, fast or irregular heartbeat, constipation Redness, blistering, peeling, or loosening of the skin, including inside the mouth Skin reactions on sun-exposed areas Side effects that usually do not require medical attention (report to your care team if they continue or are bothersome): Change in nail shape, thickness, or color Diarrhea Dry skin Fatigue Nausea Vomiting This list may not describe all possible side effects. Call your doctor for medical advice about side  effects. You may report side effects to FDA at 1-800-FDA-1088. Where should I keep my medication? This medication is given in a hospital or clinic. It will not be stored at home. NOTE: This sheet is a summary. It may not cover all possible information. If you have questions about this medicine, talk to your doctor, pharmacist, or health care provider.  2023 Elsevier/Gold Standard (2022-02-18 00:00:00)

## 2022-09-09 NOTE — Addendum Note (Signed)
Addended by: Owens Shark on: 09/09/2022 12:02 PM   Modules accepted: Orders

## 2022-09-09 NOTE — Progress Notes (Signed)
Nutrition follow up completed with patient during infusion for metastatic rectal cancer.  She continues encorafenib.  She had a colostomy placed on Mar 12, 2022.  Weight documented as 113 # on Nov 22. This is increased from 108 # on Nov 10.  Labs: Glucose 102, Creatinine 0.43, Albumin 2.6.  Patient reports nausea and vomiting almost daily, usually in the morning. She takes compazine in the morning but it is not helpful. She is unable to take Zofran. She has a poor appetite but states it is getting better. She would like to eat more variety in her diet but states "they" told her not to after her colostomy. She dislikes Ensure and Boost but is trying Ensure Clear. It is a little sweet for her but she thinks she can drink some of it. She did not like carnation instant breakfast.  Nutrition Diagnosis: Inadequate oral intake improving  Intervention: Educated to eat small amounts of higher calorie, higher protein foods. Educated on diet advancement. Encouraged slow advancement and note tolerance. Provided nutrition fact sheet. Educated on strategies for reducing the sweetness of clear liquid supplements. Provided ordering information. Provided tips on nausea. Encouraged her to try compazine before bed and again in the morning. Gave RD contact information.  Monitoring, Evaluation, Goals: Patient will tolerate increased calories and protein to promote weight gain/stabilization.  Next Visit: To be scheduled as needed.

## 2022-09-09 NOTE — Progress Notes (Signed)
Patient seen by Lisa Thomas NP today  Vitals are within treatment parameters.  Labs reviewed by Lisa Thomas NP and are within treatment parameters.  Per physician team, patient is ready for treatment and there are NO modifications to the treatment plan.     

## 2022-09-09 NOTE — Progress Notes (Signed)
[facility] OFFICE PROGRESS NOTE   Diagnosis: Rectal cancer  INTERVAL HISTORY:   Karen Kaufman returns as scheduled.  She continues encorafenib.  She completed another cycle of Panitumumab 08/28/2022.  She has intermittent nausea.  She tends to vomit in the morning hours.  No mouth sores.  No diarrhea.  No rash.  Appetite is better.  She has gained some weight.  Back pain is unchanged.  She continues frequent oxycodone.  She estimates taking pain medication about every 4 hours.  Objective:  Vital signs in last 24 hours:  Blood pressure 98/70, pulse 85, temperature 98.2 F (36.8 C), temperature source Oral, resp. rate 18, height _0  (1.676 m), weight 113 lb (51.3 kg), SpO2 98 %.    HEENT: No thrush or ulcers. Resp: Lungs clear bilaterally. Cardio: Regular rate and rhythm. GI: Abdomen is soft.  No hepatomegaly.  Tender right upper abdomen.  Left lower quadrant colostomy.  Soft stool in the collection bag. Vascular: Trace pitting edema lower leg bilaterally.  Calves soft and nontender. Skin: No rash. Port-A-Cath without erythema.  Lab Results:  Lab Results  Component Value Date   WBC 3.8 (L) 09/09/2022   HGB 9.8 (L) 09/09/2022   HCT 31.3 (L) 09/09/2022   MCV 89.9 09/09/2022   PLT 124 (L) 09/09/2022   NEUTROABS 2.8 09/09/2022    Imaging:  No results found.  Medications: I have reviewed the patient's current medications.  Assessment/Plan: Colorectal cancer 02/08/2022 CT abdomen/pelvis without contrast-severe circumferential wall thickening of the distal sigmoid colon and rectum; enlarged perirectal and low left retroperitoneal lymph nodes; multiple ill-defined hypodense liver lesions; trace ascites in the low pelvis.  02/08/2022 Colonoscopy by Dr. Charlyne Mom almost completely obstructing medium-sized mass in the rectosigmoid colon measuring from 12 to 17 cm.  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  Foundation  1-microsatellite stable, tumor mutation burden 5, BRAFV600E, K-ras wild-type 02/09/2022 CEA 3.2 CT chest 02/18/2022-chest adenopathy.  Multiple hypodense liver lesions. 02/20/2022 biopsy of liver lesion-fragments of benign liver parenchyma with reactive changes.  No evidence of malignancy. MRI abdomen 03/01/2022-numerous hypovascular hepatic lesions having imaging characteristics consider diagnostic of widespread metastatic disease to the liver.  Retroperitoneal lymphadenopathy concerning for metastatic disease. Cycle 1 FOLFOX 03/02/2022 03/11/2022 CTs abdomen/pelvis-distal large bowel obstruction at the site of a large annular mass at the rectosigmoid junction.  Progressive liver metastases. Compared to 4/23 CT. Progressive perirectal, left periaortic and right pericardiophrenic nodal metastases.  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 Cycle 2 FOLFOX 03/31/2022 Cycle 3 FOLFOX 04/13/2022 Cycle 4 FOLFOX delayed 04/28/2022 secondary to neutropenia and thrombocytopenia MRI lumbar spine 05/07/2022-pathologic compression fractures at L1 and L3, multiple additional hypointense lesions concerning for metastatic disease CT abdomen/pelvis 05/08/2022-decrease in size of liver metastases and pelvic/retroperitoneal adenopathy, stable prepericardial/juxta diaphragmatic nodes, new L5 metastasis, L1 and L3 compression fractures, L1 compression fracture present on 03/11/2022 CT Lumbar spine radiation 05/11/2022 - 05/25/2022, 3000 cGy in 10 fractions Cycle 4 FOLFOX 05/26/2022, Udenyca Cycle 5 FOLFOX 06/10/2022, Udenyca Cycle 6 FOLFOX 06/23/2022, Udenyca, oxaliplatin dose reduced due to thrombocytopenia, dexamethasone premedication decreased to 5 mg per patient request MRI brain 06/26/2022-findings concerning for osseous metastatic disease in the calvarium, skull base and proximal cervical spine.  No evidence of metastatic disease in the brain.   CT abdomen/pelvis 06/27/2022-high-grade small bowel obstruction transitioning  in the anterior mid to lower abdomen in the proximal ileum; increased scattered ascites in the abdomen and pelvis, mild; similar appearance of rectosigmoid junction thickening; left perirectal lymph  node stable to slightly improved since 05/08/2022, significantly smaller than on earlier studies; improved hepatic metastases; evidence of progression in osseous metastatic disease with a new lesion in the left superior pubic ramus and enlarging lesions in the sacrum CT abdomen/pelvis 07/02/2022 persistent partial small bowel obstruction similar to prior examination 5 days earlier; unchanged ascites; stable partially treated hepatic and osseous metastatic disease; T12 compression fracture appears new from 02/18/2022. Radiation T12-L4, 5 fraction course planned, 1 fraction given 07/01/2022 Encorafenib and every 2-week Panitumumab beginning 07/27/2022 Rectal bleeding, lower abdominal pain, constipation secondary to #1 Multiple family members with cancer Hypercholesterolemia Port-A-Cath placement 02/20/2022 Hospital admission 03/11/2022-colonic obstruction secondary to colon cancer. Neutropenia secondary to chemotherapy Admission 06/26/2022 and 07/02/2022 with small bowel obstruction COVID 08/20/2022  Disposition: Ms. Dillingham appears stable.  She continues encorafenib and every 2-week Panitumumab.  Plan to continue the same.  Restaging CTs prior to next office visit.  CBC and chemistry panel reviewed.  Labs adequate to proceed as above.  She continues frequent oxycodone.  We discussed adding MS Contin 15 mg every 12 hours.  She agrees with this plan.  She understands to continue oxycodone as needed.  The lower extremity edema is likely secondary to a combination of hypoalbuminemia, liver metastases.  She notes improvement with elevation.  She will try support stockings as well.  She will return for follow-up in 2 weeks, restaging CTs a few days prior.  Ned Card ANP/GNP-BC   09/09/2022  11:16  AM

## 2022-09-09 NOTE — Telephone Encounter (Signed)
Patient is schedule  on 09/15/22 at [facility] for her Ct scan. She is aware of the appointment.

## 2022-09-09 NOTE — Progress Notes (Signed)
1235 Pain 6 on pain scale. Back. Pain medication given as ordered 1326: patient states back pain down to 4. States chair is hard to sit in, repositioned with 2 pillows with relief.  Blood pressure 88/54 sleepy in chair. Pain down to 2 on pain scale. Up to wheelchair to discharge blood pressure back to baseline.

## 2022-09-11 ENCOUNTER — Other Ambulatory Visit (HOSPITAL_COMMUNITY): Payer: Self-pay

## 2022-09-11 ENCOUNTER — Other Ambulatory Visit: Payer: Self-pay | Admitting: Oncology

## 2022-09-14 ENCOUNTER — Other Ambulatory Visit (HOSPITAL_COMMUNITY): Payer: Self-pay

## 2022-09-14 MED ORDER — BRAFTOVI 75 MG PO CAPS
300.0000 mg | ORAL_CAPSULE | Freq: Every day | ORAL | 0 refills | Status: DC
  Filled 2022-09-14: qty 120, 30d supply, fill #0

## 2022-09-15 ENCOUNTER — Other Ambulatory Visit: Payer: Self-pay | Admitting: Oncology

## 2022-09-15 ENCOUNTER — Ambulatory Visit (HOSPITAL_BASED_OUTPATIENT_CLINIC_OR_DEPARTMENT_OTHER)
Admission: RE | Admit: 2022-09-15 | Discharge: 2022-09-15 | Disposition: A | Discharge status: Home / Self Care | Payer: BC Managed Care – PPO | Source: Ambulatory Visit | Attending: Nurse Practitioner | Admitting: Nurse Practitioner

## 2022-09-15 DIAGNOSIS — C20 Malignant neoplasm of rectum: Secondary | ICD-10-CM

## 2022-09-15 DIAGNOSIS — C7951 Secondary malignant neoplasm of bone: Secondary | ICD-10-CM

## 2022-09-15 MED ORDER — IOHEXOL 300 MG/ML  SOLN
100.0000 mL | Freq: Once | INTRAMUSCULAR | Status: AC | PRN
  Administered 2022-09-15: 80 mL via INTRAVENOUS

## 2022-09-15 MED ORDER — HEPARIN SOD (PORK) LOCK FLUSH 100 UNIT/ML IV SOLN
500.0000 [IU] | Freq: Once | INTRAVENOUS | Status: AC
  Administered 2022-09-15: 500 [IU] via INTRAVENOUS

## 2022-09-17 ENCOUNTER — Other Ambulatory Visit: Payer: Self-pay | Admitting: Nurse Practitioner

## 2022-09-17 ENCOUNTER — Other Ambulatory Visit (HOSPITAL_COMMUNITY): Payer: Self-pay

## 2022-09-17 ENCOUNTER — Other Ambulatory Visit: Payer: Self-pay

## 2022-09-17 ENCOUNTER — Telehealth: Payer: Self-pay | Admitting: *Deleted

## 2022-09-17 DIAGNOSIS — C7951 Secondary malignant neoplasm of bone: Secondary | ICD-10-CM

## 2022-09-17 DIAGNOSIS — C20 Malignant neoplasm of rectum: Secondary | ICD-10-CM

## 2022-09-17 MED ORDER — OXYCODONE HCL 5 MG PO TABS: 5.0000 mg | ORAL_TABLET | ORAL | 0 refills | Status: DC | PRN

## 2022-09-17 NOTE — Telephone Encounter (Signed)
Karen Kaufman called to request refill on her oxycodone (will be out soon). Informed her the NP is filling it now.

## 2022-09-18 ENCOUNTER — Other Ambulatory Visit: Payer: Self-pay

## 2022-09-20 ENCOUNTER — Other Ambulatory Visit: Payer: Self-pay | Admitting: Oncology

## 2022-09-21 ENCOUNTER — Other Ambulatory Visit: Payer: Self-pay

## 2022-09-22 ENCOUNTER — Inpatient Hospital Stay: Payer: BC Managed Care – PPO

## 2022-09-22 ENCOUNTER — Other Ambulatory Visit (HOSPITAL_COMMUNITY): Payer: Self-pay

## 2022-09-22 ENCOUNTER — Inpatient Hospital Stay: Payer: BC Managed Care – PPO | Attending: Nurse Practitioner

## 2022-09-22 ENCOUNTER — Inpatient Hospital Stay (HOSPITAL_BASED_OUTPATIENT_CLINIC_OR_DEPARTMENT_OTHER): Payer: BC Managed Care – PPO | Admitting: Oncology

## 2022-09-22 ENCOUNTER — Encounter: Payer: Self-pay | Admitting: *Deleted

## 2022-09-22 VITALS — BP 96/59 | HR 88 | Temp 97.7°F | Resp 14 | Wt 114.8 lb

## 2022-09-22 DIAGNOSIS — C787 Secondary malignant neoplasm of liver and intrahepatic bile duct: Secondary | ICD-10-CM | POA: Diagnosis not present

## 2022-09-22 DIAGNOSIS — T451X5D Adverse effect of antineoplastic and immunosuppressive drugs, subsequent encounter: Secondary | ICD-10-CM | POA: Insufficient documentation

## 2022-09-22 DIAGNOSIS — C7951 Secondary malignant neoplasm of bone: Secondary | ICD-10-CM | POA: Diagnosis present

## 2022-09-22 DIAGNOSIS — R410 Disorientation, unspecified: Secondary | ICD-10-CM | POA: Insufficient documentation

## 2022-09-22 DIAGNOSIS — R112 Nausea with vomiting, unspecified: Secondary | ICD-10-CM | POA: Insufficient documentation

## 2022-09-22 DIAGNOSIS — E7800 Pure hypercholesterolemia, unspecified: Secondary | ICD-10-CM | POA: Diagnosis not present

## 2022-09-22 DIAGNOSIS — Z933 Colostomy status: Secondary | ICD-10-CM | POA: Insufficient documentation

## 2022-09-22 DIAGNOSIS — R970 Elevated carcinoembryonic antigen [CEA]: Secondary | ICD-10-CM | POA: Diagnosis not present

## 2022-09-22 DIAGNOSIS — C20 Malignant neoplasm of rectum: Secondary | ICD-10-CM

## 2022-09-22 DIAGNOSIS — Z95828 Presence of other vascular implants and grafts: Secondary | ICD-10-CM

## 2022-09-22 DIAGNOSIS — M549 Dorsalgia, unspecified: Secondary | ICD-10-CM | POA: Diagnosis not present

## 2022-09-22 LAB — CMP (CANCER CENTER ONLY)
ALT: 16 U/L (ref 0–44)
AST: 44 U/L — ABNORMAL HIGH (ref 15–41)
Albumin: 2.4 g/dL — ABNORMAL LOW (ref 3.5–5.0)
Alkaline Phosphatase: 210 U/L — ABNORMAL HIGH (ref 38–126)
Anion gap: 9 (ref 5–15)
BUN: 9 mg/dL (ref 8–23)
CO2: 26 mmol/L (ref 22–32)
Calcium: 7.4 mg/dL — ABNORMAL LOW (ref 8.9–10.3)
Chloride: 100 mmol/L (ref 98–111)
Creatinine: 0.57 mg/dL (ref 0.44–1.00)
GFR, Estimated: >60 mL/min (ref >60)
Glucose, Bld: 119 mg/dL — ABNORMAL HIGH (ref 70–99)
Potassium: 3.6 mmol/L (ref 3.5–5.1)
Sodium: 135 mmol/L (ref 135–145)
Total Bilirubin: 1.1 mg/dL (ref 0.3–1.2)
Total Protein: 4.5 g/dL — ABNORMAL LOW (ref 6.5–8.1)

## 2022-09-22 LAB — CBC WITH DIFFERENTIAL (CANCER CENTER ONLY)
Abs Immature Granulocytes: 0.18 10*3/uL — ABNORMAL HIGH (ref 0.00–0.07)
Basophils Absolute: 0.1 10*3/uL (ref 0.0–0.1)
Basophils Relative: 1 %
Eosinophils Absolute: 0.4 10*3/uL (ref 0.0–0.5)
Eosinophils Relative: 4 %
HCT: 33.3 % — ABNORMAL LOW (ref 36.0–46.0)
Hemoglobin: 10.7 g/dL — ABNORMAL LOW (ref 12.0–15.0)
Immature Granulocytes: 2 %
Lymphocytes Relative: 5 %
Lymphs Abs: 0.5 10*3/uL — ABNORMAL LOW (ref 0.7–4.0)
MCH: 27.9 pg (ref 26.0–34.0)
MCHC: 32.1 g/dL (ref 30.0–36.0)
MCV: 86.7 fL (ref 80.0–100.0)
Monocytes Absolute: 0.5 10*3/uL (ref 0.1–1.0)
Monocytes Relative: 6 %
Neutro Abs: 7.6 10*3/uL (ref 1.7–7.7)
Neutrophils Relative %: 82 %
Platelet Count: 92 10*3/uL — ABNORMAL LOW (ref 150–400)
RBC: 3.84 MIL/uL — ABNORMAL LOW (ref 3.87–5.11)
RDW: 18 % — ABNORMAL HIGH (ref 11.5–15.5)
WBC Count: 9.3 10*3/uL (ref 4.0–10.5)
nRBC: 0 % (ref 0.0–0.2)

## 2022-09-22 LAB — MAGNESIUM: Magnesium: 1.5 mg/dL — ABNORMAL LOW (ref 1.7–2.4)

## 2022-09-22 LAB — CEA (ACCESS): CEA (CHCC): 30.69 ng/mL — ABNORMAL HIGH (ref 0.00–5.00)

## 2022-09-22 MED ORDER — HEPARIN SOD (PORK) LOCK FLUSH 100 UNIT/ML IV SOLN
500.0000 [IU] | Freq: Once | INTRAVENOUS | Status: AC
  Administered 2022-09-22: 500 [IU] via INTRAVENOUS

## 2022-09-22 MED ORDER — SODIUM CHLORIDE 0.9 % IV SOLN: Freq: Once | INTRAVENOUS | Status: AC

## 2022-09-22 MED ORDER — SODIUM CHLORIDE 0.9% FLUSH
10.0000 mL | Freq: Once | INTRAVENOUS | Status: AC
  Administered 2022-09-22: 10 mL via INTRAVENOUS

## 2022-09-22 MED ORDER — MAGNESIUM SULFATE 2 GM/50ML IV SOLN
2.0000 g | Freq: Once | INTRAVENOUS | Status: AC
  Administered 2022-09-22: 2 g via INTRAVENOUS
  Filled 2022-09-22: qty 50

## 2022-09-22 NOTE — Progress Notes (Signed)
Patient seen by Dr. Benay Spice today  Vitals are within treatment parameters.  Labs reviewed by Dr. Benay Spice and are not all within treatment parameters. Mg+ 1.5 and platelets 92,000  Per physician team, patient will not be receiving treatment today. Progression on CT scan. Will change to FOLFIRI next week. Needs 2 grams IV Mg+ today.

## 2022-09-22 NOTE — Progress Notes (Signed)
DISCONTINUE ON PATHWAY REGIMEN - Colorectal     Cycle 1: A cycle is 28 days:     Encorafenib      Cetuximab      Cetuximab    Cycles 2 and beyond: A cycle is every 28 days:     Encorafenib      Cetuximab   **Always confirm dose/schedule in your pharmacy ordering system**  REASON: Disease Progression PRIOR TREATMENT: [id number]: Encorafenib 300 mg Daily D1-28 + Cetuximab 400/250 mg/m2 D1,8,15,22 q28 Days TREATMENT RESPONSE: Progressive Disease (PD)  START ON PATHWAY REGIMEN - Colorectal     A cycle is every 14 days:     Bevacizumab-xxxx      Irinotecan      Leucovorin      Fluorouracil      Fluorouracil   **Always confirm dose/schedule in your pharmacy ordering system**  Patient Characteristics: Distant Metastases, Nonsurgical Candidate, BRAF V600 Mutation Positive (KRAS/NRAS Wild-Type), Standard Cytotoxic/Targeted Therapy, Third Line Standard Cytotoxic/Targeted Therapy Tumor Location: Rectal Therapeutic Status: Distant Metastases Microsatellite/Mismatch Repair Status: MSS/pMMR BRAF Mutation Status: Mutation Positive KRAS/NRAS Mutation Status: Wild-Type (no mutation) Standard Cytotoxic/Targeted Line of Therapy: Third Line Standard Cytotoxic/Targeted Therapy Intent of Therapy: Non-Curative / Palliative Intent, Discussed with Patient

## 2022-09-22 NOTE — Patient Instructions (Signed)
Magnesium Sulfate Injection What is this medication? MAGNESIUM SULFATE (mag NEE zee um SUL fate) prevents and treats low levels of magnesium in your body. It may also be used to prevent and treat seizures during pregnancy in people with high blood pressure disorders, such as preeclampsia or eclampsia. Magnesium plays an important role in maintaining the health of your muscles and nervous system. This medicine may be used for other purposes; ask your health care provider or pharmacist if you have questions. What should I tell my care team before I take this medication? They need to know if you have any of these conditions: Heart disease History of irregular heart beat Kidney disease An unusual or allergic reaction to magnesium sulfate, medications, foods, dyes, or preservatives Pregnant or trying to get pregnant Breast-feeding How should I use this medication? This medication is for infusion into a vein. It is given in a hospital or clinic setting. Talk to your care team about the use of this medication in children. While this medication may be prescribed for selected conditions, precautions do apply. Overdosage: If you think you have taken too much of this medicine contact a poison control center or emergency room at once. NOTE: This medicine is only for you. Do not share this medicine with others. What if I miss a dose? This does not apply. What may interact with this medication? Certain medications for anxiety or sleep Certain medications for seizures, such phenobarbital Digoxin Medications that relax muscles for surgery Narcotic medications for pain This list may not describe all possible interactions. Give your health care provider a list of all the medicines, herbs, non-prescription drugs, or dietary supplements you use. Also tell them if you smoke, drink alcohol, or use illegal drugs. Some items may interact with your medicine. What should I watch for while using this  medication? Your condition will be monitored carefully while you are receiving this medication. You may need blood work done while you are receiving this medication. What side effects may I notice from receiving this medication? Side effects that you should report to your care team as soon as possible: Allergic reactions--skin rash, itching, hives, swelling of the face, lips, tongue, or throat High magnesium level--confusion, drowsiness, facial flushing, redness, sweating, muscle weakness, fast or irregular heartbeat, trouble breathing Low blood pressure--dizziness, feeling faint or lightheaded, blurry vision Side effects that usually do not require medical attention (report to your care team if they continue or are bothersome): Headache Nausea This list may not describe all possible side effects. Call your doctor for medical advice about side effects. You may report side effects to FDA at 1-800-FDA-1088. Where should I keep my medication? This medication is given in a hospital or clinic and will not be stored at home. NOTE: This sheet is a summary. It may not cover all possible information. If you have questions about this medicine, talk to your doctor, pharmacist, or health care provider.  2023 Elsevier/Gold Standard (2013-02-10 00:00:00)  

## 2022-09-22 NOTE — Patient Instructions (Signed)

## 2022-09-22 NOTE — Progress Notes (Signed)
[place] OFFICE PROGRESS NOTE   Diagnosis: Rectal cancer  INTERVAL HISTORY:   Ms. Furlan returns as scheduled.  She is here with her husband.  She continues treatment with encorafenib and panitumumab.  She is taking doxycycline.  No rash.  She complains of constant nausea and intermittent emesis.  She is having bowel movements.  No diarrhea.  She complains of pain at multiple sites including the "tailbone "and lower back.  She takes MS Contin and oxycodone for pain.  Objective:  Vital signs in last 24 hours:  Blood pressure (!) 96/59, pulse 88, temperature 97.7 F (36.5 C), temperature source Oral, resp. rate 14, weight 114 lb 12.8 oz (52.1 kg), SpO2 94 %.    HEENT: No thrush Resp: Lungs clear bilaterally Cardio: Regular rate and rhythm GI: No hepatosplenomegaly, left lower quadrant colostomy, no mass Vascular: No leg edema  Skin: No rash Musculoskeletal: Tender at the left pubis  Portacath/PICC-without erythema  Lab Results:  Lab Results  Component Value Date   WBC 9.3 09/22/2022   HGB 10.7 (L) 09/22/2022   HCT 33.3 (L) 09/22/2022   MCV 86.7 09/22/2022   PLT 92 (L) 09/22/2022   NEUTROABS 7.6 09/22/2022    CMP  Lab Results  Component Value Date   NA 135 09/22/2022   K 3.6 09/22/2022   CL 100 09/22/2022   CO2 26 09/22/2022   GLUCOSE 119 (H) 09/22/2022   BUN 9 09/22/2022   CREATININE 0.57 09/22/2022   CALCIUM 7.4 (L) 09/22/2022   PROT 4.5 (L) 09/22/2022   ALBUMIN 2.4 (L) 09/22/2022   AST 44 (H) 09/22/2022   ALT 16 09/22/2022   ALKPHOS 210 (H) 09/22/2022   BILITOT 1.1 09/22/2022   GFRNONAA >60 09/22/2022   GFRAA 92 11/28/2020    Lab Results  Component Value Date   CEA1 3.2 02/09/2022   CEA 30.69 (H) 09/22/2022    Imaging: CT images from 09/15/2022 reviewed with Ms. Pogosyan and her husband  Medications: I have reviewed the patient's current medications.   Assessment/Plan: Colorectal cancer 02/08/2022 CT abdomen/pelvis without  contrast-severe circumferential wall thickening of the distal sigmoid colon and rectum; enlarged perirectal and low left retroperitoneal lymph nodes; multiple ill-defined hypodense liver lesions; trace ascites in the low pelvis.  02/08/2022 Colonoscopy by Dr. Charlyne Mom almost completely obstructing medium-sized mass in the rectosigmoid colon measuring from 12 to 17 cm.  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  Foundation 1-microsatellite stable, tumor mutation burden 5, BRAFV600E, K-ras wild-type 02/09/2022 CEA 3.2 CT chest 02/18/2022-chest adenopathy.  Multiple hypodense liver lesions. 02/20/2022 biopsy of liver lesion-fragments of benign liver parenchyma with reactive changes.  No evidence of malignancy. MRI abdomen 03/01/2022-numerous hypovascular hepatic lesions having imaging characteristics consider diagnostic of widespread metastatic disease to the liver.  Retroperitoneal lymphadenopathy concerning for metastatic disease. Cycle 1 FOLFOX 03/02/2022 03/11/2022 CTs abdomen/pelvis-distal large bowel obstruction at the site of a large annular mass at the rectosigmoid junction.  Progressive liver metastases. Compared to 4/23 CT. Progressive perirectal, left periaortic and right pericardiophrenic nodal metastases.  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 Cycle 2 FOLFOX 03/31/2022 Cycle 3 FOLFOX 04/13/2022 Cycle 4 FOLFOX delayed 04/28/2022 secondary to neutropenia and thrombocytopenia MRI lumbar spine 05/07/2022-pathologic compression fractures at L1 and L3, multiple additional hypointense lesions concerning for metastatic disease CT abdomen/pelvis 05/08/2022-decrease in size of liver metastases and pelvic/retroperitoneal adenopathy, stable prepericardial/juxta diaphragmatic nodes, new L5 metastasis, L1 and L3 compression fractures, L1 compression fracture present on 03/11/2022 CT Lumbar spine radiation 05/11/2022 -  05/25/2022, 3000 cGy in 10 fractions Cycle 4  FOLFOX 05/26/2022, Udenyca Cycle 5 FOLFOX 06/10/2022, Udenyca Cycle 6 FOLFOX 06/23/2022, Udenyca, oxaliplatin dose reduced due to thrombocytopenia, dexamethasone premedication decreased to 5 mg per patient request MRI brain 06/26/2022-findings concerning for osseous metastatic disease in the calvarium, skull base and proximal cervical spine.  No evidence of metastatic disease in the brain.   CT abdomen/pelvis 06/27/2022-high-grade small bowel obstruction transitioning in the anterior mid to lower abdomen in the proximal ileum; increased scattered ascites in the abdomen and pelvis, mild; similar appearance of rectosigmoid junction thickening; left perirectal lymph node stable to slightly improved since 05/08/2022, significantly smaller than on earlier studies; improved hepatic metastases; evidence of progression in osseous metastatic disease with a new lesion in the left superior pubic ramus and enlarging lesions in the sacrum CT abdomen/pelvis 07/02/2022 persistent partial small bowel obstruction similar to prior examination 5 days earlier; unchanged ascites; stable partially treated hepatic and osseous metastatic disease; T12 compression fracture appears new from 02/18/2022. Radiation T12-L4, 5 fraction course planned, 1 fraction given 07/01/2022 Encorafenib and every 2-week Panitumumab beginning 07/27/2022 CTs 09/15/2022-mild decrease in size of liver metastases, new small bilateral pleural effusions, stable thickening of the rectosigmoid, multifocal bone metastases with progression of a pathologic fracture at T10 and progression of mixed lytic/sclerotic left superior pubic ramus metastasis, multiple bone lesions in the chest-new from 02/18/2022 Elevated CEA 09/22/2022 Rectal bleeding, lower abdominal pain, constipation secondary to #1 Multiple family members with cancer Hypercholesterolemia Port-A-Cath placement 02/20/2022 Hospital admission 03/11/2022-colonic obstruction secondary to colon cancer. Neutropenia  secondary to chemotherapy Admission 06/26/2022 and 07/02/2022 with small bowel obstruction COVID 08/20/2022    Disposition: Ms. Babinski has metastatic rectal cancer.  She has been treated with encorafenib and panitumumab for the past 2 months.  Her overall status has not improved.  She has persistent pain.  The restaging CTs reveal new bone lesions in the chest compared to May with progression of a compression fracture at T10 and a left pubic ramus metastasis.  Liver lesions are partially improved.  The CEA is higher.  I reviewed the CT images and findings with Ms. Geffre and her husband.  We discussed treatment options.  The overall clinical pattern is consistent with disease progression.  I recommend discontinuing panitumumab and encorafenib.  We discussed treatment options.  I recommend FOLFIRI/bevacizumab.  We reviewed potential toxicities associated with irinotecan including the chance of alopecia, hematologic toxicity, nausea/vomiting, and diarrhea.  We will discuss side effects of bevacizumab when she is here for treatment next week.  She will begin Zofran for nausea.  She will discontinue doxycycline.  She will receive magnesium supplementation today.  Ms. Cokley will return for an office visit with the plan to begin salvage chemotherapy next week.  Betsy Coder, MD  09/22/2022  11:10 AM

## 2022-09-25 ENCOUNTER — Telehealth: Payer: Self-pay

## 2022-09-25 ENCOUNTER — Other Ambulatory Visit: Payer: Self-pay

## 2022-09-25 ENCOUNTER — Other Ambulatory Visit: Payer: Self-pay | Admitting: Nurse Practitioner

## 2022-09-25 DIAGNOSIS — C7951 Secondary malignant neoplasm of bone: Secondary | ICD-10-CM

## 2022-09-25 MED ORDER — DEXAMETHASONE 4 MG PO TABS: 4.0000 mg | ORAL_TABLET | Freq: Every day | ORAL | 0 refills | Status: AC

## 2022-09-25 NOTE — Telephone Encounter (Signed)
Karen Kaufman is okay with the new referral to Dr. Tammi Klippel and Decadron '4mg'$  was sent to the pharmacy

## 2022-09-25 NOTE — Telephone Encounter (Signed)
Karen Kaufman called and stated she wants a X-ray on her leg. She can not move her leg. The leg is not swollen however her ankle is swollen. No redness just painful to move. Per Dr. Benay Spice we can send in a steroid.

## 2022-09-27 ENCOUNTER — Other Ambulatory Visit: Payer: Self-pay | Admitting: Oncology

## 2022-09-28 ENCOUNTER — Other Ambulatory Visit: Payer: Self-pay | Admitting: Nurse Practitioner

## 2022-09-28 ENCOUNTER — Other Ambulatory Visit: Payer: Self-pay | Admitting: *Deleted

## 2022-09-28 DIAGNOSIS — C7951 Secondary malignant neoplasm of bone: Secondary | ICD-10-CM

## 2022-09-28 DIAGNOSIS — C20 Malignant neoplasm of rectum: Secondary | ICD-10-CM

## 2022-09-28 MED ORDER — OXYCODONE HCL 5 MG PO TABS: 5.0000 mg | ORAL_TABLET | ORAL | 0 refills | Status: AC | PRN

## 2022-09-28 NOTE — Telephone Encounter (Signed)
Mr. Uffelman called to request refill on Wynette's oxycodone.

## 2022-09-29 ENCOUNTER — Inpatient Hospital Stay (HOSPITAL_BASED_OUTPATIENT_CLINIC_OR_DEPARTMENT_OTHER): Payer: BC Managed Care – PPO | Admitting: Nurse Practitioner

## 2022-09-29 ENCOUNTER — Other Ambulatory Visit: Payer: Self-pay

## 2022-09-29 ENCOUNTER — Telehealth: Payer: Self-pay

## 2022-09-29 ENCOUNTER — Encounter: Payer: Self-pay | Admitting: Nurse Practitioner

## 2022-09-29 VITALS — BP 108/79 | HR 90 | Temp 98.1°F | Resp 18 | Ht 66.0 in | Wt 115.0 lb

## 2022-09-29 DIAGNOSIS — C20 Malignant neoplasm of rectum: Secondary | ICD-10-CM | POA: Diagnosis not present

## 2022-09-29 NOTE — Progress Notes (Signed)
[place] OFFICE PROGRESS NOTE   Diagnosis: Rectal cancer  INTERVAL HISTORY:   Ms. Aaberg returns as scheduled.  She is scheduled to begin treatment with FOLFIRI/bevacizumab tomorrow.  She is accompanied to today's visit by her husband.  He reports she has become weaker, spending more time in bed.  Appetite is poor.  She is intermittently confused.  She complains of pain/tenderness over the sternum.  No fever.  No urinary symptoms.  Objective:  Vital signs in last 24 hours:  Blood pressure 108/79, pulse 90, temperature 98.1 F (36.7 C), temperature source Oral, resp. rate 18, height 5' 6" (1.676 m), weight 115 lb (52.2 kg), SpO2 100 %.    HEENT: No thrush. Resp: Lungs clear bilaterally. Cardio: Regular rate and rhythm. GI: No hepatosplenomegaly.  Left lower quadrant colostomy. Vascular: Pitting edema lower leg bilaterally. Neuro: She appears confused, lethargic.  Slow to respond to questions.  Follows some commands.  Moves all extremities. Musculoskeletal: Tender over the lower sternum. Port-A-Cath without erythema.  Lab Results:  Lab Results  Component Value Date   WBC 9.3 09/22/2022   HGB 10.7 (L) 09/22/2022   HCT 33.3 (L) 09/22/2022   MCV 86.7 09/22/2022   PLT 92 (L) 09/22/2022   NEUTROABS 7.6 09/22/2022    Medications: I have reviewed the patient's current medications.  Assessment/Plan: Colorectal cancer 02/08/2022 CT abdomen/pelvis without contrast-severe circumferential wall thickening of the distal sigmoid colon and rectum; enlarged perirectal and low left retroperitoneal lymph nodes; multiple ill-defined hypodense liver lesions; trace ascites in the low pelvis.  02/08/2022 Colonoscopy by Dr. Charlyne Mom almost completely obstructing medium-sized mass in the rectosigmoid colon measuring from 12 to 17 cm.  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  Foundation 1-microsatellite stable, tumor mutation burden 5,  BRAFV600E, K-ras wild-type 02/09/2022 CEA 3.2 CT chest 02/18/2022-chest adenopathy.  Multiple hypodense liver lesions. 02/20/2022 biopsy of liver lesion-fragments of benign liver parenchyma with reactive changes.  No evidence of malignancy. MRI abdomen 03/01/2022-numerous hypovascular hepatic lesions having imaging characteristics consider diagnostic of widespread metastatic disease to the liver.  Retroperitoneal lymphadenopathy concerning for metastatic disease. Cycle 1 FOLFOX 03/02/2022 03/11/2022 CTs abdomen/pelvis-distal large bowel obstruction at the site of a large annular mass at the rectosigmoid junction.  Progressive liver metastases. Compared to 4/23 CT. Progressive perirectal, left periaortic and right pericardiophrenic nodal metastases.  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 Cycle 2 FOLFOX 03/31/2022 Cycle 3 FOLFOX 04/13/2022 Cycle 4 FOLFOX delayed 04/28/2022 secondary to neutropenia and thrombocytopenia MRI lumbar spine 05/07/2022-pathologic compression fractures at L1 and L3, multiple additional hypointense lesions concerning for metastatic disease CT abdomen/pelvis 05/08/2022-decrease in size of liver metastases and pelvic/retroperitoneal adenopathy, stable prepericardial/juxta diaphragmatic nodes, new L5 metastasis, L1 and L3 compression fractures, L1 compression fracture present on 03/11/2022 CT Lumbar spine radiation 05/11/2022 - 05/25/2022, 3000 cGy in 10 fractions Cycle 4 FOLFOX 05/26/2022, Udenyca Cycle 5 FOLFOX 06/10/2022, Udenyca Cycle 6 FOLFOX 06/23/2022, Udenyca, oxaliplatin dose reduced due to thrombocytopenia, dexamethasone premedication decreased to 5 mg per patient request MRI brain 06/26/2022-findings concerning for osseous metastatic disease in the calvarium, skull base and proximal cervical spine.  No evidence of metastatic disease in the brain.   CT abdomen/pelvis 06/27/2022-high-grade small bowel obstruction transitioning in the anterior mid to lower abdomen in the  proximal ileum; increased scattered ascites in the abdomen and pelvis, mild; similar appearance of rectosigmoid junction thickening; left perirectal lymph node stable to slightly improved since 05/08/2022, significantly smaller than on earlier studies; improved hepatic metastases; evidence of progression  in osseous metastatic disease with a new lesion in the left superior pubic ramus and enlarging lesions in the sacrum CT abdomen/pelvis 07/02/2022 persistent partial small bowel obstruction similar to prior examination 5 days earlier; unchanged ascites; stable partially treated hepatic and osseous metastatic disease; T12 compression fracture appears new from 02/18/2022. Radiation T12-L4, 5 fraction course planned, 1 fraction given 07/01/2022 Encorafenib and every 2-week Panitumumab beginning 07/27/2022 CTs 09/15/2022-mild decrease in size of liver metastases, new small bilateral pleural effusions, stable thickening of the rectosigmoid, multifocal bone metastases with progression of a pathologic fracture at T10 and progression of mixed lytic/sclerotic left superior pubic ramus metastasis, multiple bone lesions in the chest-new from 02/18/2022 Elevated CEA 09/22/2022 Rectal bleeding, lower abdominal pain, constipation secondary to #1 Multiple family members with cancer Hypercholesterolemia Port-A-Cath placement 02/20/2022 Hospital admission 03/11/2022-colonic obstruction secondary to colon cancer. Neutropenia secondary to chemotherapy Admission 06/26/2022 and 07/02/2022 with small bowel obstruction COVID 08/20/2022        Disposition: Ms. Sandin has metastatic colon cancer.  She has had a decline in her performance status.  She is no longer a candidate for systemic therapy.  This was reviewed with her husband at today's visit.  He agrees with the recommendation for supportive/comfort care with a hospice referral.  He understands her lifespan is likely limited to hours to a few days.  He reports her wishes, and his,  are for a home death.  NO CODE BLUE status confirmed.  We will contact [facility] and try to make arrangements for a visit today.  Patient seen with Dr. Benay Spice.    Ned Card ANP/GNP-BC   09/29/2022  8:42 AM This was a shared visit with Ned Card.  Ms. Wingert was interviewed and examined.  She has experienced a marked decline in her performance status compared to when we saw her last week.  Her decline is likely related to the metastatic tumor burden.  She is not a candidate for further chemotherapy.  She is confused.  I think it is unlikely her current symptoms are related to narcotic analgesics.  We discussed the poor prognosis with her husband.  I explained her lifespan will likely be limited to days or a few weeks.  We offered hospital admission.  He says she would like to be at home.  She will be placed on a no CODE BLUE status.  We made a hospice referral.  We will try to initiate hospice care within the next 1-2 days.  We are available to help with her care prior to and after hospice enrollment.  I was present for greater than 50% of today's visit.  I performed medical decision making.  Julieanne Manson, MD

## 2022-09-29 NOTE — Telephone Encounter (Signed)
An urgent hospice referral was faxed [phone] over to [facility] att to [facility]. Hospice's nurse will be out today around 230 for admission.

## 2022-09-30 ENCOUNTER — Telehealth: Payer: Self-pay

## 2022-09-30 ENCOUNTER — Inpatient Hospital Stay: Payer: BC Managed Care – PPO

## 2022-09-30 NOTE — Telephone Encounter (Signed)
[facility] went out on 09/29/22. She is on morphine.

## 2022-10-02 ENCOUNTER — Inpatient Hospital Stay: Payer: BC Managed Care – PPO

## 2022-10-06 ENCOUNTER — Inpatient Hospital Stay: Payer: BC Managed Care – PPO | Admitting: Oncology

## 2022-10-06 ENCOUNTER — Inpatient Hospital Stay: Payer: BC Managed Care – PPO

## 2022-10-07 ENCOUNTER — Other Ambulatory Visit: Payer: Self-pay

## 2022-10-19 DEATH — deceased

## 2022-11-13 NOTE — Progress Notes (Signed)
  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  Beams/energy:   A 3D field set-up was employed with 6 MV X-rays  Narrative: The patient had severe abdominal pain after her first fraction of treatment.  Plan: The patient elected to discontinue any further treatments after her first fraction given the severe abdominal pain and decline in performance status.    ________________________________  Sheral Apley Tammi Klippel, M.D.
